# Patient Record
Sex: Female | Born: 1960 | Race: White | Hispanic: No | Marital: Married | Smoking: Never smoker
Health system: Southern US, Community
[De-identification: ages and names within clinical notes are randomized; demographics above are authoritative.]

## PROBLEM LIST (undated history)

## (undated) DIAGNOSIS — Z9889 Other specified postprocedural states: Secondary | ICD-10-CM

## (undated) DIAGNOSIS — I493 Ventricular premature depolarization: Secondary | ICD-10-CM

## (undated) DIAGNOSIS — M5136 Other intervertebral disc degeneration, lumbar region: Secondary | ICD-10-CM

## (undated) DIAGNOSIS — I341 Nonrheumatic mitral (valve) prolapse: Secondary | ICD-10-CM

## (undated) DIAGNOSIS — E232 Diabetes insipidus: Secondary | ICD-10-CM

## (undated) DIAGNOSIS — J45909 Unspecified asthma, uncomplicated: Secondary | ICD-10-CM

## (undated) DIAGNOSIS — E039 Hypothyroidism, unspecified: Secondary | ICD-10-CM

## (undated) DIAGNOSIS — J842 Lymphoid interstitial pneumonia: Secondary | ICD-10-CM

## (undated) DIAGNOSIS — IMO0002 Reserved for concepts with insufficient information to code with codable children: Secondary | ICD-10-CM

## (undated) DIAGNOSIS — M1990 Unspecified osteoarthritis, unspecified site: Secondary | ICD-10-CM

## (undated) DIAGNOSIS — R519 Headache, unspecified: Secondary | ICD-10-CM

## (undated) DIAGNOSIS — D649 Anemia, unspecified: Secondary | ICD-10-CM

## (undated) DIAGNOSIS — M51369 Other intervertebral disc degeneration, lumbar region without mention of lumbar back pain or lower extremity pain: Secondary | ICD-10-CM

## (undated) DIAGNOSIS — M329 Systemic lupus erythematosus, unspecified: Secondary | ICD-10-CM

## (undated) DIAGNOSIS — E079 Disorder of thyroid, unspecified: Secondary | ICD-10-CM

## (undated) DIAGNOSIS — M797 Fibromyalgia: Secondary | ICD-10-CM

## (undated) HISTORY — DX: Unspecified osteoarthritis, unspecified site: M19.90

## (undated) HISTORY — PX: COLONOSCOPY: SHX174

## (undated) HISTORY — DX: Other intervertebral disc degeneration, lumbar region: M51.36

## (undated) HISTORY — DX: Disorder of thyroid, unspecified: E07.9

## (undated) HISTORY — PX: PITUITARY EXCISION: SHX745

## (undated) HISTORY — DX: Anemia, unspecified: D64.9

## (undated) HISTORY — PX: TONSILLECTOMY: SUR1361

## (undated) HISTORY — DX: Ventricular premature depolarization: I49.3

---

## 1991-03-10 HISTORY — PX: PITUITARY EXCISION: SHX745

## 1998-04-09 ENCOUNTER — Other Ambulatory Visit: Admission: RE | Admit: 1998-04-09 | Discharge: 1998-04-09 | Payer: Self-pay | Admitting: Obstetrics and Gynecology

## 1998-11-11 ENCOUNTER — Emergency Department (HOSPITAL_COMMUNITY): Admission: EM | Admit: 1998-11-11 | Discharge: 1998-11-11 | Payer: Self-pay | Admitting: Emergency Medicine

## 1998-11-11 ENCOUNTER — Encounter: Payer: Self-pay | Admitting: Emergency Medicine

## 1998-11-22 ENCOUNTER — Ambulatory Visit (HOSPITAL_COMMUNITY): Admission: RE | Admit: 1998-11-22 | Discharge: 1998-11-22 | Payer: Self-pay | Admitting: Neurology

## 1998-11-27 ENCOUNTER — Encounter: Payer: Self-pay | Admitting: Neurology

## 1998-11-27 ENCOUNTER — Ambulatory Visit (HOSPITAL_COMMUNITY): Admission: RE | Admit: 1998-11-27 | Discharge: 1998-11-27 | Payer: Self-pay | Admitting: Neurology

## 1999-08-06 ENCOUNTER — Other Ambulatory Visit: Admission: RE | Admit: 1999-08-06 | Discharge: 1999-08-06 | Payer: Self-pay | Admitting: Obstetrics and Gynecology

## 2000-05-19 ENCOUNTER — Emergency Department (HOSPITAL_COMMUNITY): Admission: EM | Admit: 2000-05-19 | Discharge: 2000-05-19 | Payer: Self-pay | Admitting: Emergency Medicine

## 2000-05-19 ENCOUNTER — Encounter: Payer: Self-pay | Admitting: Emergency Medicine

## 2000-08-31 ENCOUNTER — Other Ambulatory Visit: Admission: RE | Admit: 2000-08-31 | Discharge: 2000-08-31 | Payer: Self-pay | Admitting: Obstetrics and Gynecology

## 2000-12-30 ENCOUNTER — Encounter: Payer: Self-pay | Admitting: Internal Medicine

## 2000-12-30 ENCOUNTER — Ambulatory Visit: Admission: RE | Admit: 2000-12-30 | Discharge: 2000-12-30 | Payer: Self-pay | Admitting: Internal Medicine

## 2001-01-17 ENCOUNTER — Ambulatory Visit (HOSPITAL_COMMUNITY): Admission: RE | Admit: 2001-01-17 | Discharge: 2001-01-17 | Payer: Self-pay | Admitting: Internal Medicine

## 2001-01-17 ENCOUNTER — Encounter: Payer: Self-pay | Admitting: Internal Medicine

## 2001-11-15 ENCOUNTER — Other Ambulatory Visit: Admission: RE | Admit: 2001-11-15 | Discharge: 2001-11-15 | Payer: Self-pay | Admitting: Obstetrics and Gynecology

## 2002-01-18 ENCOUNTER — Ambulatory Visit (HOSPITAL_COMMUNITY): Admission: RE | Admit: 2002-01-18 | Discharge: 2002-01-18 | Payer: Self-pay | Admitting: Obstetrics and Gynecology

## 2002-01-18 ENCOUNTER — Encounter (INDEPENDENT_AMBULATORY_CARE_PROVIDER_SITE_OTHER): Payer: Self-pay

## 2002-02-22 ENCOUNTER — Encounter: Payer: Self-pay | Admitting: Emergency Medicine

## 2002-02-22 ENCOUNTER — Emergency Department (HOSPITAL_COMMUNITY): Admission: EM | Admit: 2002-02-22 | Discharge: 2002-02-23 | Payer: Self-pay | Admitting: Emergency Medicine

## 2002-03-22 ENCOUNTER — Emergency Department (HOSPITAL_COMMUNITY): Admission: EM | Admit: 2002-03-22 | Discharge: 2002-03-22 | Payer: Self-pay | Admitting: Emergency Medicine

## 2002-09-21 ENCOUNTER — Ambulatory Visit (HOSPITAL_COMMUNITY): Admission: RE | Admit: 2002-09-21 | Discharge: 2002-09-21 | Payer: Self-pay | Admitting: Internal Medicine

## 2002-09-21 ENCOUNTER — Encounter: Payer: Self-pay | Admitting: Internal Medicine

## 2003-01-05 ENCOUNTER — Other Ambulatory Visit: Admission: RE | Admit: 2003-01-05 | Discharge: 2003-01-05 | Payer: Self-pay | Admitting: Obstetrics and Gynecology

## 2003-02-16 ENCOUNTER — Encounter: Admission: RE | Admit: 2003-02-16 | Discharge: 2003-02-16 | Payer: Self-pay | Admitting: Obstetrics and Gynecology

## 2003-06-15 ENCOUNTER — Emergency Department (HOSPITAL_COMMUNITY): Admission: EM | Admit: 2003-06-15 | Discharge: 2003-06-15 | Payer: Self-pay | Admitting: *Deleted

## 2003-06-18 ENCOUNTER — Ambulatory Visit (HOSPITAL_COMMUNITY): Admission: RE | Admit: 2003-06-18 | Discharge: 2003-06-18 | Payer: Self-pay | Admitting: Rheumatology

## 2003-07-10 ENCOUNTER — Inpatient Hospital Stay (HOSPITAL_COMMUNITY): Admission: EM | Admit: 2003-07-10 | Discharge: 2003-07-19 | Payer: Self-pay | Admitting: Emergency Medicine

## 2004-04-28 ENCOUNTER — Ambulatory Visit: Payer: Self-pay | Admitting: Internal Medicine

## 2004-05-06 ENCOUNTER — Ambulatory Visit: Payer: Self-pay | Admitting: Internal Medicine

## 2004-08-01 ENCOUNTER — Ambulatory Visit (HOSPITAL_COMMUNITY): Admission: RE | Admit: 2004-08-01 | Discharge: 2004-08-01 | Payer: Self-pay | Admitting: Specialist

## 2004-08-08 ENCOUNTER — Encounter: Admission: RE | Admit: 2004-08-08 | Discharge: 2004-08-08 | Payer: Self-pay | Admitting: Obstetrics and Gynecology

## 2004-11-21 ENCOUNTER — Other Ambulatory Visit: Admission: RE | Admit: 2004-11-21 | Discharge: 2004-11-21 | Payer: Self-pay | Admitting: Obstetrics and Gynecology

## 2005-04-27 ENCOUNTER — Encounter (HOSPITAL_COMMUNITY): Admission: RE | Admit: 2005-04-27 | Discharge: 2005-06-18 | Payer: Self-pay | Admitting: Internal Medicine

## 2005-06-22 ENCOUNTER — Emergency Department (HOSPITAL_COMMUNITY): Admission: EM | Admit: 2005-06-22 | Discharge: 2005-06-22 | Payer: Self-pay | Admitting: Emergency Medicine

## 2005-10-08 ENCOUNTER — Ambulatory Visit (HOSPITAL_COMMUNITY): Admission: RE | Admit: 2005-10-08 | Discharge: 2005-10-08 | Payer: Self-pay | Admitting: Obstetrics and Gynecology

## 2007-04-07 ENCOUNTER — Emergency Department (HOSPITAL_COMMUNITY): Admission: EM | Admit: 2007-04-07 | Discharge: 2007-04-08 | Payer: Self-pay | Admitting: Family Medicine

## 2007-06-21 ENCOUNTER — Ambulatory Visit (HOSPITAL_COMMUNITY): Admission: RE | Admit: 2007-06-21 | Discharge: 2007-06-21 | Payer: Self-pay | Admitting: Obstetrics and Gynecology

## 2007-07-12 ENCOUNTER — Emergency Department (HOSPITAL_COMMUNITY): Admission: EM | Admit: 2007-07-12 | Discharge: 2007-07-12 | Payer: Self-pay | Admitting: Emergency Medicine

## 2007-08-16 ENCOUNTER — Ambulatory Visit (HOSPITAL_COMMUNITY): Admission: RE | Admit: 2007-08-16 | Discharge: 2007-08-16 | Payer: Self-pay | Admitting: Internal Medicine

## 2007-11-24 ENCOUNTER — Ambulatory Visit (HOSPITAL_COMMUNITY): Admission: RE | Admit: 2007-11-24 | Discharge: 2007-11-24 | Payer: Self-pay | Admitting: Rheumatology

## 2008-02-01 ENCOUNTER — Emergency Department (HOSPITAL_COMMUNITY): Admission: EM | Admit: 2008-02-01 | Discharge: 2008-02-01 | Payer: Self-pay | Admitting: Emergency Medicine

## 2008-04-06 ENCOUNTER — Ambulatory Visit: Payer: Self-pay | Admitting: Cardiovascular Disease

## 2008-04-06 ENCOUNTER — Inpatient Hospital Stay (HOSPITAL_COMMUNITY): Admission: EM | Admit: 2008-04-06 | Discharge: 2008-04-10 | Payer: Self-pay | Admitting: Emergency Medicine

## 2008-04-09 ENCOUNTER — Encounter (HOSPITAL_BASED_OUTPATIENT_CLINIC_OR_DEPARTMENT_OTHER): Payer: Self-pay | Admitting: Internal Medicine

## 2008-04-10 ENCOUNTER — Encounter (INDEPENDENT_AMBULATORY_CARE_PROVIDER_SITE_OTHER): Payer: Self-pay | Admitting: Surgery

## 2008-08-30 ENCOUNTER — Ambulatory Visit (HOSPITAL_COMMUNITY): Admission: RE | Admit: 2008-08-30 | Discharge: 2008-08-30 | Payer: Self-pay | Admitting: Obstetrics and Gynecology

## 2009-05-22 ENCOUNTER — Ambulatory Visit: Payer: Self-pay | Admitting: Internal Medicine

## 2009-05-28 LAB — CBC WITH DIFFERENTIAL/PLATELET
Eosinophils Absolute: 0 10*3/uL (ref 0.0–0.5)
MONO#: 0.6 10*3/uL (ref 0.1–0.9)
MONO%: 10.3 % (ref 0.0–14.0)
NEUT#: 4.2 10*3/uL (ref 1.5–6.5)
RBC: 4.06 10*6/uL (ref 3.70–5.45)
RDW: 15.1 % — ABNORMAL HIGH (ref 11.2–14.5)
WBC: 5.8 10*3/uL (ref 3.9–10.3)

## 2009-05-30 LAB — COMPREHENSIVE METABOLIC PANEL
Albumin: 4.5 g/dL (ref 3.5–5.2)
Alkaline Phosphatase: 70 U/L (ref 39–117)
CO2: 20 mEq/L (ref 19–32)
Glucose, Bld: 88 mg/dL (ref 70–99)
Potassium: 4.2 mEq/L (ref 3.5–5.3)
Sodium: 124 mEq/L — ABNORMAL LOW (ref 135–145)
Total Protein: 7.8 g/dL (ref 6.0–8.3)

## 2009-05-30 LAB — PROTEIN ELECTROPHORESIS, SERUM
Beta 2: 4.8 % (ref 3.2–6.5)
Gamma Globulin: 20.5 % — ABNORMAL HIGH (ref 11.1–18.8)

## 2009-05-30 LAB — FERRITIN: Ferritin: 6 ng/mL — ABNORMAL LOW (ref 10–291)

## 2009-05-30 LAB — VITAMIN B12: Vitamin B-12: 371 pg/mL (ref 211–911)

## 2009-05-30 LAB — IRON AND TIBC: Iron: 79 ug/dL (ref 42–145)

## 2009-05-30 LAB — FOLATE: Folate: >20 ng/mL

## 2009-07-23 ENCOUNTER — Ambulatory Visit: Payer: Self-pay | Admitting: Internal Medicine

## 2009-07-24 LAB — CBC WITH DIFFERENTIAL/PLATELET
Basophils Absolute: 0 10*3/uL (ref 0.0–0.1)
HCT: 32.5 % — ABNORMAL LOW (ref 34.8–46.6)
HGB: 11 g/dL — ABNORMAL LOW (ref 11.6–15.9)
MCH: 27.7 pg (ref 25.1–34.0)
MONO#: 0.7 10*3/uL (ref 0.1–0.9)
NEUT%: 63.9 % (ref 38.4–76.8)
WBC: 4.9 10*3/uL (ref 3.9–10.3)
lymph#: 1 10*3/uL (ref 0.9–3.3)

## 2009-07-24 LAB — IRON AND TIBC
%SAT: 20 % (ref 20–55)
TIBC: 339 ug/dL (ref 250–470)

## 2009-07-24 LAB — FERRITIN: Ferritin: 27 ng/mL (ref 10–291)

## 2009-09-18 ENCOUNTER — Ambulatory Visit (HOSPITAL_COMMUNITY): Admission: RE | Admit: 2009-09-18 | Discharge: 2009-09-18 | Payer: Self-pay | Admitting: Rheumatology

## 2009-10-23 ENCOUNTER — Ambulatory Visit: Payer: Self-pay | Admitting: Internal Medicine

## 2009-10-23 LAB — CBC & DIFF AND RETIC
BASO%: 0.3 % (ref 0.0–2.0)
LYMPH%: 31.9 % (ref 14.0–49.7)
MCHC: 35.1 g/dL (ref 31.5–36.0)
MONO#: 0.6 10*3/uL (ref 0.1–0.9)
Platelets: 242 10*3/uL (ref 145–400)
RBC: 3.97 10*6/uL (ref 3.70–5.45)
RDW: 14 % (ref 11.2–14.5)
Retic %: 0.49 % — ABNORMAL LOW (ref 0.50–1.50)
Retic Ct Abs: 19.45 10*3/uL (ref 18.30–72.70)
WBC: 3.5 10*3/uL — ABNORMAL LOW (ref 3.9–10.3)

## 2009-10-23 LAB — FERRITIN: Ferritin: 50 ng/mL (ref 10–291)

## 2009-10-23 LAB — IRON AND TIBC: Iron: 62 ug/dL (ref 42–145)

## 2010-01-08 ENCOUNTER — Ambulatory Visit: Payer: Self-pay | Admitting: Internal Medicine

## 2010-01-08 ENCOUNTER — Telehealth: Payer: Self-pay | Admitting: Internal Medicine

## 2010-01-08 DIAGNOSIS — K310 Acute dilatation of stomach: Secondary | ICD-10-CM | POA: Insufficient documentation

## 2010-01-08 DIAGNOSIS — R1012 Left upper quadrant pain: Secondary | ICD-10-CM | POA: Insufficient documentation

## 2010-01-08 DIAGNOSIS — R110 Nausea: Secondary | ICD-10-CM | POA: Insufficient documentation

## 2010-01-08 DIAGNOSIS — D509 Iron deficiency anemia, unspecified: Secondary | ICD-10-CM

## 2010-01-08 LAB — CONVERTED CEMR LAB
ALT: 17 units/L (ref 0–35)
Albumin: 4 g/dL (ref 3.5–5.2)
Bilirubin, Direct: 0.1 mg/dL (ref 0.0–0.3)
Eosinophils Absolute: 0 10*3/uL (ref 0.0–0.7)
Eosinophils Relative: 1.3 % (ref 0.0–5.0)
Hemoglobin: 11.9 g/dL — ABNORMAL LOW (ref 12.0–15.0)
Lymphs Abs: 1 10*3/uL (ref 0.7–4.0)
MCV: 89.8 fL (ref 78.0–100.0)
Monocytes Absolute: 0.5 10*3/uL (ref 0.1–1.0)
Neutro Abs: 2.3 10*3/uL (ref 1.4–7.7)
Neutrophils Relative %: 58.1 % (ref 43.0–77.0)
Platelets: 230 10*3/uL (ref 150.0–400.0)
RDW: 13.7 % (ref 11.5–14.6)
Total Bilirubin: 0.5 mg/dL (ref 0.3–1.2)
Total Protein: 7.4 g/dL (ref 6.0–8.3)
WBC: 3.9 10*3/uL — ABNORMAL LOW (ref 4.5–10.5)

## 2010-01-09 ENCOUNTER — Ambulatory Visit: Payer: Self-pay | Admitting: Cardiology

## 2010-01-13 ENCOUNTER — Telehealth: Payer: Self-pay | Admitting: Internal Medicine

## 2010-03-30 ENCOUNTER — Encounter: Payer: Self-pay | Admitting: *Deleted

## 2010-04-08 NOTE — Assessment & Plan Note (Signed)
Summary: abdominal pain/bloating/sheri   History of Present Illness Visit Type: Initial Visit Primary GI MD: Stan Head MD [facility] Primary Provider: Creola Corn, MD Chief Complaint: abdominal pain, bloating 2 weeks; patient has Lupus History of Present Illness:   PLEASANT 50 YO FEMALE KNOWN TO DR. Juanda Chance FROM COLONOSCOPY IN 2006 WHICH WAS NORMAL. SHE HAS LUPUS,RAYNAUDS,CHRONIC ANEMIA-IS FOLLOWED BY RHEUMATOLOGY. SHE COMES IN TODAY WITH C/O PROGRESSIVE ABDOMINAL PAIN OVER THE PAST 11/2 TO 2 WEEKS.SHE DESCRIBES MARKED INCREASE IN FATIGUE OVER THE PAST 2 MONTHS ,AND WEAKNESS WHICH IS FELT DUE TO A FLARE OF THE LUPUS. SHE HAD BEEN OFF PLAQUENIL ,AND JUST RESTARTED A FEW DAYS AGO. NO OTHER NEW MEDS.  SHE DESRIBES LOWER ABDOMINAL PAIN, BLOATING AND VISIBLE DISTENTION OF HER ABDOMEN. HER CLOTHES FEEL TIGHT. APPETITE IS DECREASED AND SHE FEELS FULL,UNCOMFORTABLE QUICKLY WITH ANY by mouth INTAKE. HAS HAD SOME NAUSEA, NO VOMITING, +CHILLS, NO DOCUMENTED FEVER. BM'S HAVE BEEN NORMAL, NO MELENA OR HEME.TAKES OCCASIONAL ALEVE. SHE WAS SEEN BY DR MCOMB EARLIER THIS WEEK,SOME QUESTION OF AN OVARIAN CYST AND IS ON A COURSE OF CIPRO. NO IMAGING DONE. SHE REPORTS A NEGATIVE UA  AT THAT TIME.   GI Review of Systems    Reports abdominal pain, bloating, chest pain, loss of appetite, nausea, and  weight loss.     Location of  Abdominal pain: upper abdomen.    Denies acid reflux, belching, dysphagia with liquids, dysphagia with solids, heartburn, vomiting, vomiting blood, and  weight gain.      Reports constipation.     Denies anal fissure, black tarry stools, change in bowel habit, diarrhea, diverticulosis, fecal incontinence, heme positive stool, hemorrhoids, irritable bowel syndrome, jaundice, light color stool, liver problems, rectal bleeding, and  rectal pain. Preventive Screening-Counseling & Management  Alcohol-Tobacco     Smoking Status: never      Drug Use:  no.      Current Medications (verified): 1)   Synthroid 150 Mcg Tabs (Levothyroxine Sodium) .... Once Daily 2)  Topamax 100 Mg Tabs (Topiramate) .... At Bedtime 3)  Ddavp 0.01 % Soln (Desmopressin Acetate Spray) .... 2 Puffs At Bedtime 4)  Cipro 500 Mg Tabs (Ciprofloxacin Hcl) .... Two Times A Day 5)  Plaquenil 200 Mg Tabs (Hydroxychloroquine Sulfate) .... Take 2 Tablets By Mouth Two Times A Day 6)  Nitrostat 0.4 Mg Subl (Nitroglycerin) .... As Needed 7)  Integra Plus  Caps (Fefum-Fepoly-Fa-B Cmp-C-Biot) .... Once Daily 8)  Albuterol Sulfate (2.5 Mg/49ml) 0.083% Nebu (Albuterol Sulfate) .... As Needed Seasonally  Allergies (verified): 1)  ! * Magic Mouthwash 2)  ! Zithromax (Azithromycin) 3)  ! 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  Alcohol Use - no Illicit Drug Use - no Smoking Status:  never Drug Use:  no  Review of Systems       The patient complains of arthritis/joint pain, back pain, change in vision, fatigue, headaches-new, heart murmur, menstrual pain, and shortness of breath.  The patient denies allergy/sinus, anemia, anxiety-new, blood in urine, breast changes/lumps, confusion, cough, coughing up blood, depression-new, fainting, fever, hearing problems, heart rhythm changes, itching, muscle pains/cramps,  night sweats, nosebleeds, pregnancy symptoms, skin rash, sleeping problems, sore throat, swelling of feet/legs, swollen lymph glands, thirst - excessive , urination - excessive , urination changes/pain, urine leakage,  vision changes, and voice change.         SEE HPI  Vital Signs:  Patient profile:   50 year old female Height:      68 inches Weight:      150.38 pounds BMI:     22.95 Pulse rate:   60 / minute Pulse rhythm:   regular BP sitting:   110 / 74  (left arm) Cuff size:   regular  Vitals Entered By: June McMurray CMA Duncan Dull) (January 08, 2010 2:32 PM)  Physical Exam  General:  Well developed, well nourished, no acute distress. Head:  Normocephalic and atraumatic. Eyes:  PERRLA, no icterus. Lungs:  Clear throughout to auscultation. Heart:  Regular rate and rhythm; no murmurs, rubs,  or bruits. Abdomen:  SOFT, NO TYMPANY, BS+ NO PALP MASS OR HSM, TENDER LMQ/LLQ,NO GUARDING Rectal:  BROWN, HEME NEGATIVE STOOL Extremities:  No clubbing, cyanosis, edema or deformities noted. Neurologic:  Alert and  oriented x4;  grossly normal neurologically. Psych:  Alert and cooperative. Normal mood and affect.   Impression & Recommendations:  Problem # 1:  ABDOMINAL PAIN, LEFT UPPER QUADRANT (ICD-789.02) Assessment New  50 YO FEMALE WITH 2 WEEK HX OF PROGRESSIVE LEFT SIDED ABDOMINAL PAIN-LUQ/LLQ, ASSOCIATED WITH BLOATING ,DISTENTION, NAUSEA. ETIOLOGY IS NOT CLEAR.  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 LAST HGB 12.8  Problem # 3:  SLE Assessment: Comment Only ON PLAQUENIL  Patient Instructions: 1)  Please go to lab, basement level. 2)  We scheduled the CT scan at [facility] [address]. 3)  Directions and contrast  provided. 4)  We will call you with the  lab and CT scan results. 5)  Copy sent to : Creola Corn, Md 6)  The medication list was reviewed and reconciled.  All changed / newly prescribed medications were explained.  A complete medication list was provided to the patient / caregiver.

## 2010-04-08 NOTE — Progress Notes (Signed)
Summary: Severe abd pain/wans to be seen today  Phone Note Call from Patient Call back at [phone]  [employer] [facility] [employer] Employee   Call For: DR Juanda Chance Reason for Call: Talk to Nurse Summary of Call: Has severe abd pain. Thought it was related to her Lupus so she has been holding out until today when she had appt with her Rheumatologist. She was adviced to see her primary or GI today.  Initial call taken by: Leanor Kail [facility],  January 08, 2010 12:16 PM  Follow-up for Phone Call        Patient has a few week hx of abdominal pain.  She has a hx of Lupus and thought her abdominal pain was related.  She has pain "under my bra to my pelvis".  Patient c/o bloating needing to wear maternity pants.  She was recommended by Dr Gaye Alken office to be seen by GI or her primary care.  Patient  will come in this afternoon and see Mike Gip PA at 2:30 Follow-up by: Darcey Nora RN, CGRN,  January 08, 2010 1:28 PM

## 2010-04-08 NOTE — Progress Notes (Signed)
Summary: CT Results  Phone Note Call from Patient Call back at Work Phone [phone]   Caller: Patient Call For: Dr. Juanda Chance Reason for Call: Talk to Nurse Details for Reason: CT results Summary of Call: Patient called requesting results of CT scan done last Thursday. Please call. Initial call taken by: Schuyler Amor,  January 13, 2010 10:21 AM  Follow-up for Phone Call        Spoke with patient. She would like results from CT abd/pelvis done on 01/09/10. Please, advise. Follow-up by: Jesse Fall RN,  January 13, 2010 11:15 AM  Additional Follow-up for Phone Call Additional follow up Details #1::         Per Amy Esterwood, PA REGINA-CAN YOU PLEASE CALL Cathey J  Sachdev- I SAW HER LAST WEEK-CT ABD/PELVIS IS NEGATIVE. PT HAS LUPUS-FIND OUT HOW SHE IS FEELING,IF HER ABD PAIN IS BETTER WORSE ETC-WE MAY NEED TO GET HER BACK IN TO HER RHEUMATOLOGIST.    Additional Follow-up for Phone Call Additional follow up Details #2::    Spoke with patient and gave her CT results as per Mike Gip, PA. Per patient she is still having pain in left side. During the day, she feels "more full." Please, advise. Follow-up by: Jesse Fall RN,  January 13, 2010 2:01 PM  Additional Follow-up for Phone Call Additional follow up Details #3:: Details for Additional Follow-up Action Taken:  I AM CONCERNED THE PAIN MAY BE RELATED TO A FLARE OF HER LUPUS WITH POSSIBLE VASCULITIS IN ABDOMEN- I WOULD LIKE HER TO SEE HER RHEUMATOLOGIST SOON TO DISCUSS COURSE OF STEROIDS FIND OUT WHO SHE SEES, AND MAYBE WE SHOULD TRY TO GET HER WORKED IN   Additional Follow-up by: Peterson Ao,  January 13, 2010 2:46 PM   Message left for patient to call back at patients work number. Jesse Fall RN  January 13, 2010 3:06 PM    Spoke with patient. She wants to call Dr.   Corliss Skains herself. She will call me back when she gets the appointment.Jesse Fall RN January 13, 2010 4:00 PM    Spoke with patient. She is  going to call Dr. Corliss Skains today on her lunch hour. She will let us know what is suggested. Jesse Fall, RN January 14, 2010 11:00 AM    Patient called back to let us know she is waiting to hear from Dr. Corliss Skains. She states she is feeling much better. Jesse Fall, RN November 9,2011 8:30 AM  Appended Document: CT Results Patient called to report that Dr. Drusilla Kanner wants her to finish Cipro and take Plaquenil 400 mg. Patient is to call Dr. Drusilla Kanner if symptoms return.

## 2010-04-08 NOTE — Procedures (Signed)
Summary: Colonoscopy   Colonoscopy  Procedure date:  05/06/2004  Findings:      Location:  [facility].   Patient Name: Stefanie, [name]. MRN:  Procedure Procedures: Colonoscopy CPT: [phone].  Personnel: Endoscopist: Larua Collier L. Juanda Chance, MD.  Referred By: Creola Corn, MD.  Exam Location: Exam performed in Outpatient Clinic. Outpatient  Patient Consent: Procedure, Alternatives, Risks and Benefits discussed, consent obtained, from patient. Consent was obtained by the RN.  Indications  Evaluation of: Anemia with low iron saturation.  History  Current Medications: Patient is not currently taking Coumadin.  Pre-Exam Physical: Performed May 06, 2004. Entire physical exam was normal.  Exam Exam: Extent of exam reached: Cecum, extent intended: Cecum.  The cecum was identified by appendiceal orifice and IC valve. Colon retroflexion performed. Images taken. ASA Classification: I. Tolerance: good.  Monitoring: Pulse and BP monitoring, Oximetry used. Supplemental O2 given.  Colon Prep Used Miralax for colon prep.  Sedation Meds: Patient assessed and found to be appropriate for moderate (conscious) sedation. Fentanyl 75 mcg. given IV. Versed 7 mg. given IV.  Findings - NORMAL EXAM: Cecum.   Assessment Normal examination.  Comments: no polyps Events  Unplanned Interventions: No intervention was required.  Unplanned Events: There were no complications. Plans Medication Plan: Iron: 1 QD, starting May 06, 2004   Patient Education: Patient given standard instructions for: Yearly hemoccult testing recommended. Patient instructed to get routine colonoscopy every 10 years.  Comments: nothing to account for the iron deficiency anemia, if not resposive to iron, may need further evaluation Disposition: After procedure patient sent to recovery. After recovery patient sent home.    This report was created from the original endoscopy report, which was reviewed and  signed by the above listed endoscopist.     cc: Dr Timothy Lasso

## 2010-04-23 ENCOUNTER — Other Ambulatory Visit: Payer: Self-pay | Admitting: Internal Medicine

## 2010-04-23 ENCOUNTER — Encounter (HOSPITAL_BASED_OUTPATIENT_CLINIC_OR_DEPARTMENT_OTHER): Payer: 59 | Admitting: Internal Medicine

## 2010-04-23 DIAGNOSIS — D509 Iron deficiency anemia, unspecified: Secondary | ICD-10-CM

## 2010-04-23 DIAGNOSIS — D649 Anemia, unspecified: Secondary | ICD-10-CM

## 2010-04-23 DIAGNOSIS — D638 Anemia in other chronic diseases classified elsewhere: Secondary | ICD-10-CM

## 2010-04-23 LAB — CBC WITH DIFFERENTIAL/PLATELET
EOS%: 1 % (ref 0.0–7.0)
HGB: 11.9 g/dL (ref 11.6–15.9)
MCH: 30.1 pg (ref 25.1–34.0)
MCV: 86.5 fL (ref 79.5–101.0)
NEUT#: 3.3 10*3/uL (ref 1.5–6.5)
NEUT%: 65.9 % (ref 38.4–76.8)
RDW: 13.8 % (ref 11.2–14.5)
WBC: 5 10*3/uL (ref 3.9–10.3)

## 2010-04-23 LAB — IRON AND TIBC: %SAT: 27 % (ref 20–55)

## 2010-04-30 ENCOUNTER — Encounter (HOSPITAL_BASED_OUTPATIENT_CLINIC_OR_DEPARTMENT_OTHER): Payer: 59 | Admitting: Internal Medicine

## 2010-04-30 DIAGNOSIS — D509 Iron deficiency anemia, unspecified: Secondary | ICD-10-CM

## 2010-04-30 DIAGNOSIS — D638 Anemia in other chronic diseases classified elsewhere: Secondary | ICD-10-CM

## 2010-04-30 DIAGNOSIS — D649 Anemia, unspecified: Secondary | ICD-10-CM

## 2010-05-13 ENCOUNTER — Emergency Department (HOSPITAL_COMMUNITY): Payer: 59

## 2010-05-13 ENCOUNTER — Emergency Department (HOSPITAL_COMMUNITY)
Admission: EM | Admit: 2010-05-13 | Discharge: 2010-05-13 | Disposition: A | Discharge status: Home / Self Care | Payer: 59 | Attending: Emergency Medicine | Admitting: Emergency Medicine

## 2010-05-13 ENCOUNTER — Encounter (HOSPITAL_COMMUNITY): Payer: Self-pay | Admitting: Radiology

## 2010-05-13 DIAGNOSIS — E039 Hypothyroidism, unspecified: Secondary | ICD-10-CM | POA: Insufficient documentation

## 2010-05-13 DIAGNOSIS — E232 Diabetes insipidus: Secondary | ICD-10-CM | POA: Insufficient documentation

## 2010-05-13 DIAGNOSIS — Z79899 Other long term (current) drug therapy: Secondary | ICD-10-CM | POA: Insufficient documentation

## 2010-05-13 DIAGNOSIS — R071 Chest pain on breathing: Secondary | ICD-10-CM | POA: Insufficient documentation

## 2010-05-13 DIAGNOSIS — R059 Cough, unspecified: Secondary | ICD-10-CM | POA: Insufficient documentation

## 2010-05-13 DIAGNOSIS — M329 Systemic lupus erythematosus, unspecified: Secondary | ICD-10-CM | POA: Insufficient documentation

## 2010-05-13 DIAGNOSIS — R05 Cough: Secondary | ICD-10-CM | POA: Insufficient documentation

## 2010-05-13 DIAGNOSIS — R0602 Shortness of breath: Secondary | ICD-10-CM | POA: Insufficient documentation

## 2010-05-13 LAB — BASIC METABOLIC PANEL WITH GFR
BUN: 9 mg/dL (ref 6–23)
CO2: 27 meq/L (ref 19–32)
Calcium: 9.5 mg/dL (ref 8.4–10.5)
Chloride: 94 meq/L — ABNORMAL LOW (ref 96–112)
Creatinine, Ser: 0.62 mg/dL (ref 0.4–1.2)
GFR calc non Af Amer: >60 mL/min
Glucose, Bld: 86 mg/dL (ref 70–99)
Potassium: 3.9 meq/L (ref 3.5–5.1)
Sodium: 130 meq/L — ABNORMAL LOW (ref 135–145)

## 2010-05-13 LAB — POCT CARDIAC MARKERS
CKMB, poc: 1.9 ng/mL (ref 1.0–8.0)
CKMB, poc: 1.5 ng/mL (ref 1.0–8.0)
Myoglobin, poc: 95.1 ng/mL (ref 12–200)
Myoglobin, poc: 86 ng/mL (ref 12–200)
Troponin i, poc: <0.05 ng/mL (ref 0.00–0.09)
Troponin i, poc: <0.05 ng/mL (ref 0.00–0.09)

## 2010-05-13 LAB — DIFFERENTIAL
Basophils Absolute: 0 10*3/uL (ref 0.0–0.1)
Basophils Relative: 0 % (ref 0–1)
Eosinophils Absolute: 0 10*3/uL (ref 0.0–0.7)
Eosinophils Relative: 0 % (ref 0–5)
Lymphocytes Relative: 23 % (ref 12–46)
Lymphs Abs: 1.4 K/uL (ref 0.7–4.0)
Monocytes Absolute: 0.5 K/uL (ref 0.1–1.0)
Monocytes Relative: 9 % (ref 3–12)
Neutro Abs: 4 K/uL (ref 1.7–7.7)
Neutrophils Relative %: 68 % (ref 43–77)

## 2010-05-13 LAB — CBC
HCT: 36.2 % (ref 36.0–46.0)
Hemoglobin: 12.3 g/dL (ref 12.0–15.0)
MCH: 28.9 pg (ref 26.0–34.0)
MCHC: 34 g/dL (ref 30.0–36.0)
MCV: 85 fL (ref 78.0–100.0)
Platelets: 276 10*3/uL (ref 150–400)
RBC: 4.26 MIL/uL (ref 3.87–5.11)
RDW: 13.8 % (ref 11.5–15.5)
WBC: 5.9 10*3/uL (ref 4.0–10.5)

## 2010-05-13 LAB — D-DIMER, QUANTITATIVE: D-Dimer, Quant: 0.88 ug/mL-FEU — ABNORMAL HIGH (ref 0.00–0.48)

## 2010-05-13 MED ORDER — IOHEXOL 300 MG/ML  SOLN
58.0000 mL | Freq: Once | INTRAMUSCULAR | Status: AC | PRN
  Administered 2010-05-13: 58 mL via INTRAVENOUS

## 2010-06-23 LAB — URINALYSIS, ROUTINE W REFLEX MICROSCOPIC
Bilirubin Urine: NEGATIVE
Nitrite: NEGATIVE
Protein, ur: NEGATIVE mg/dL
Specific Gravity, Urine: 1.006 (ref 1.005–1.030)
Urobilinogen, UA: 1 mg/dL (ref 0.0–1.0)

## 2010-06-23 LAB — CARDIAC PANEL(CRET KIN+CKTOT+MB+TROPI)
CK, MB: 0.7 ng/mL (ref 0.3–4.0)
Relative Index: INVALID (ref 0.0–2.5)
Total CK: 22 U/L (ref 7–177)
Troponin I: <0.01 ng/mL (ref 0.00–0.06)

## 2010-06-23 LAB — ANTITHROMBIN III: AntiThromb III Func: 123 % — ABNORMAL HIGH (ref 76–126)

## 2010-06-23 LAB — CBC
MCHC: 33.1 g/dL (ref 30.0–36.0)
MCV: 87.3 fL (ref 78.0–100.0)
MCV: 87.3 fL (ref 78.0–100.0)
Platelets: 236 10*3/uL (ref 150–400)
Platelets: 241 10*3/uL (ref 150–400)
Platelets: 256 10*3/uL (ref 150–400)
RBC: 4.01 MIL/uL (ref 3.87–5.11)
RDW: 13.7 % (ref 11.5–15.5)
RDW: 13.8 % (ref 11.5–15.5)
RDW: 13.8 % (ref 11.5–15.5)
WBC: 10 10*3/uL (ref 4.0–10.5)

## 2010-06-23 LAB — PROTIME-INR: Prothrombin Time: 12.6 seconds (ref 11.6–15.2)

## 2010-06-23 LAB — COMPREHENSIVE METABOLIC PANEL
ALT: 14 U/L (ref 0–35)
AST: 20 U/L (ref 0–37)
Albumin: 3.5 g/dL (ref 3.5–5.2)
CO2: 24 mEq/L (ref 19–32)
Calcium: 8.8 mg/dL (ref 8.4–10.5)
Creatinine, Ser: 0.72 mg/dL (ref 0.4–1.2)
GFR calc Af Amer: >60 mL/min (ref >60)
Sodium: 133 mEq/L — ABNORMAL LOW (ref 135–145)
Total Protein: 7.5 g/dL (ref 6.0–8.3)

## 2010-06-23 LAB — BASIC METABOLIC PANEL
BUN: 13 mg/dL (ref 6–23)
Calcium: 8.9 mg/dL (ref 8.4–10.5)
Creatinine, Ser: 0.68 mg/dL (ref 0.4–1.2)
GFR calc non Af Amer: >60 mL/min (ref >60)
Glucose, Bld: 78 mg/dL (ref 70–99)

## 2010-06-23 LAB — BETA-2-GLYCOPROTEIN I ABS, IGG/M/A
Beta-2 Glyco I IgG: <4 U/mL (ref <20)
Beta-2-Glycoprotein I IgM: <4 U/mL (ref <10)

## 2010-06-23 LAB — DIFFERENTIAL
Basophils Absolute: 0 10*3/uL (ref 0.0–0.1)
Basophils Relative: 0 % (ref 0–1)
Lymphocytes Relative: 33 % (ref 12–46)
Neutro Abs: 4.1 10*3/uL (ref 1.7–7.7)

## 2010-06-23 LAB — D-DIMER, QUANTITATIVE: D-Dimer, Quant: 0.93 ug/mL-FEU — ABNORMAL HIGH (ref 0.00–0.48)

## 2010-06-23 LAB — LUPUS ANTICOAGULANT PANEL: Lupus Anticoagulant: NOT DETECTED

## 2010-06-23 LAB — CARDIOLIPIN ANTIBODIES, IGG, IGM, IGA: Anticardiolipin IgG: <7 [GPL'U] — ABNORMAL LOW (ref <11)

## 2010-06-23 LAB — APTT: aPTT: 28 seconds (ref 24–37)

## 2010-06-23 LAB — MYOGLOBIN, SERUM: Myoglobin: 26 ng/mL (ref <111)

## 2010-06-23 LAB — PROTEIN S ACTIVITY: Protein S Activity: 119 % (ref 69–129)

## 2010-06-23 LAB — PROTEIN S, TOTAL: Protein S Ag, Total: 84 % (ref 70–140)

## 2010-06-23 LAB — TECHNOLOGIST SMEAR REVIEW

## 2010-06-23 LAB — PROTEIN C, TOTAL: Protein C, Total: 113 % (ref 70–140)

## 2010-06-23 LAB — FACTOR 5 LEIDEN

## 2010-06-23 LAB — HOMOCYSTEINE: Homocysteine: 7.8 umol/L (ref 4.0–15.4)

## 2010-06-24 LAB — CBC
HCT: 33.3 % — ABNORMAL LOW (ref 36.0–46.0)
Hemoglobin: 10.6 g/dL — ABNORMAL LOW (ref 12.0–15.0)
MCHC: 33.6 g/dL (ref 30.0–36.0)
MCHC: 34.6 g/dL (ref 30.0–36.0)
MCV: 84.7 fL (ref 78.0–100.0)
MCV: 86.7 fL (ref 78.0–100.0)
Platelets: 247 10*3/uL (ref 150–400)
RBC: 3.63 MIL/uL — ABNORMAL LOW (ref 3.87–5.11)
RDW: 13.8 % (ref 11.5–15.5)
RDW: 13.8 % (ref 11.5–15.5)

## 2010-06-24 LAB — COMPREHENSIVE METABOLIC PANEL
ALT: 15 U/L (ref 0–35)
AST: 17 U/L (ref 0–37)
Albumin: 3.1 g/dL — ABNORMAL LOW (ref 3.5–5.2)
CO2: 22 mEq/L (ref 19–32)
Chloride: 103 mEq/L (ref 96–112)
Creatinine, Ser: 0.63 mg/dL (ref 0.4–1.2)
GFR calc Af Amer: >60 mL/min (ref >60)
Potassium: 4 mEq/L (ref 3.5–5.1)
Sodium: 130 mEq/L — ABNORMAL LOW (ref 135–145)
Total Bilirubin: 0.1 mg/dL — ABNORMAL LOW (ref 0.3–1.2)

## 2010-06-24 LAB — ANA: Anti Nuclear Antibody(ANA): POSITIVE — AB

## 2010-06-24 LAB — SEDIMENTATION RATE: Sed Rate: 19 mm/hr (ref 0–22)

## 2010-06-24 LAB — BASIC METABOLIC PANEL
BUN: 16 mg/dL (ref 6–23)
CO2: 21 mEq/L (ref 19–32)
Chloride: 99 mEq/L (ref 96–112)
Glucose, Bld: 122 mg/dL — ABNORMAL HIGH (ref 70–99)
Potassium: 3.9 mEq/L (ref 3.5–5.1)

## 2010-06-24 LAB — EXTRACTABLE NUCLEAR ANTIGEN ANTIBODY
Ribonucleic Protein(ENA) Antibody, IgG: 0.2 AI (ref <1.0)
SSB (La) (ENA) Antibody, IgG: 6 AI — ABNORMAL HIGH (ref <1.0)
Scleroderma (Scl-70) (ENA) Antibody, IgG: <0.2 AI (ref <1.0)

## 2010-06-24 LAB — C-REACTIVE PROTEIN: CRP: 0.1 mg/dL — ABNORMAL LOW (ref <0.6)

## 2010-06-24 LAB — CARDIAC PANEL(CRET KIN+CKTOT+MB+TROPI)
CK, MB: 0.6 ng/mL (ref 0.3–4.0)
Troponin I: <0.01 ng/mL (ref 0.00–0.06)
Troponin I: <0.01 ng/mL (ref 0.00–0.06)

## 2010-06-24 LAB — ANTI-NUCLEAR AB-TITER (ANA TITER)

## 2010-06-24 LAB — ANTI-NEUTROPHIL ANTIBODY

## 2010-06-24 LAB — RHEUMATOID FACTOR: Rhuematoid fact SerPl-aCnc: 176 IU/mL — ABNORMAL HIGH (ref 0–20)

## 2010-07-18 ENCOUNTER — Inpatient Hospital Stay (HOSPITAL_COMMUNITY): Payer: 59

## 2010-07-18 ENCOUNTER — Inpatient Hospital Stay (HOSPITAL_COMMUNITY)
Admission: AD | Admit: 2010-07-18 | Discharge: 2010-07-21 | DRG: 641 | Disposition: A | Discharge status: Home / Self Care | Payer: 59 | Source: Ambulatory Visit | Attending: Internal Medicine | Admitting: Internal Medicine

## 2010-07-18 DIAGNOSIS — K219 Gastro-esophageal reflux disease without esophagitis: Secondary | ICD-10-CM | POA: Diagnosis present

## 2010-07-18 DIAGNOSIS — N390 Urinary tract infection, site not specified: Secondary | ICD-10-CM | POA: Diagnosis present

## 2010-07-18 DIAGNOSIS — I10 Essential (primary) hypertension: Secondary | ICD-10-CM | POA: Diagnosis present

## 2010-07-18 DIAGNOSIS — I7300 Raynaud's syndrome without gangrene: Secondary | ICD-10-CM | POA: Diagnosis present

## 2010-07-18 DIAGNOSIS — E232 Diabetes insipidus: Secondary | ICD-10-CM | POA: Diagnosis present

## 2010-07-18 DIAGNOSIS — E039 Hypothyroidism, unspecified: Secondary | ICD-10-CM | POA: Diagnosis present

## 2010-07-18 DIAGNOSIS — M329 Systemic lupus erythematosus, unspecified: Secondary | ICD-10-CM | POA: Diagnosis present

## 2010-07-18 DIAGNOSIS — D509 Iron deficiency anemia, unspecified: Secondary | ICD-10-CM | POA: Diagnosis present

## 2010-07-18 DIAGNOSIS — E871 Hypo-osmolality and hyponatremia: Principal | ICD-10-CM | POA: Diagnosis present

## 2010-07-18 DIAGNOSIS — A498 Other bacterial infections of unspecified site: Secondary | ICD-10-CM | POA: Diagnosis present

## 2010-07-18 LAB — SODIUM, URINE, RANDOM: Sodium, Ur: 186 mEq/L

## 2010-07-18 LAB — URINALYSIS, ROUTINE W REFLEX MICROSCOPIC
Bilirubin Urine: NEGATIVE
Ketones, ur: NEGATIVE mg/dL
Nitrite: NEGATIVE
Urobilinogen, UA: 0.2 mg/dL (ref 0.0–1.0)
pH: 8 (ref 5.0–8.0)

## 2010-07-18 LAB — URINE MICROSCOPIC-ADD ON

## 2010-07-19 LAB — CBC
MCHC: 35.4 g/dL (ref 30.0–36.0)
Platelets: 245 10*3/uL (ref 150–400)
RDW: 12.5 % (ref 11.5–15.5)
WBC: 2.3 10*3/uL — ABNORMAL LOW (ref 4.0–10.5)

## 2010-07-19 LAB — BASIC METABOLIC PANEL
CO2: 19 mEq/L (ref 19–32)
Calcium: 9.1 mg/dL (ref 8.4–10.5)
Calcium: 9.7 mg/dL (ref 8.4–10.5)
Creatinine, Ser: 0.72 mg/dL (ref 0.4–1.2)
GFR calc Af Amer: >60 mL/min (ref >60)
GFR calc Af Amer: >60 mL/min (ref >60)
GFR calc non Af Amer: >60 mL/min (ref >60)
GFR calc non Af Amer: >60 mL/min (ref >60)
Glucose, Bld: 84 mg/dL (ref 70–99)
Sodium: 122 mEq/L — ABNORMAL LOW (ref 135–145)

## 2010-07-19 LAB — TSH: TSH: 17.058 u[IU]/mL — ABNORMAL HIGH (ref 0.350–4.500)

## 2010-07-20 LAB — CBC
MCH: 29.7 pg (ref 26.0–34.0)
MCHC: 34.9 g/dL (ref 30.0–36.0)
Platelets: 271 10*3/uL (ref 150–400)
RBC: 4.37 MIL/uL (ref 3.87–5.11)

## 2010-07-20 LAB — MAGNESIUM: Magnesium: 2.3 mg/dL (ref 1.5–2.5)

## 2010-07-20 LAB — BASIC METABOLIC PANEL
Calcium: 9.5 mg/dL (ref 8.4–10.5)
Chloride: 102 mEq/L (ref 96–112)
Creatinine, Ser: 0.68 mg/dL (ref 0.4–1.2)
GFR calc Af Amer: >60 mL/min (ref >60)

## 2010-07-21 LAB — BASIC METABOLIC PANEL
CO2: 21 mEq/L (ref 19–32)
Calcium: 9.2 mg/dL (ref 8.4–10.5)
Chloride: 106 mEq/L (ref 96–112)
Creatinine, Ser: 0.64 mg/dL (ref 0.4–1.2)
GFR calc non Af Amer: >60 mL/min (ref >60)
Potassium: 3.8 mEq/L (ref 3.5–5.1)

## 2010-07-21 LAB — CBC
HCT: 34.7 % — ABNORMAL LOW (ref 36.0–46.0)
MCV: 86.3 fL (ref 78.0–100.0)
RBC: 4.02 MIL/uL (ref 3.87–5.11)
WBC: 3.4 10*3/uL — ABNORMAL LOW (ref 4.0–10.5)

## 2010-07-21 LAB — URINE CULTURE

## 2010-07-22 NOTE — Op Note (Signed)
Stefanie [name], Stefanie [name]               ACCOUNT NO.:  [id number]   MEDICAL RECORD NO.:  [id number]          PATIENT TYPE:  INP   LOCATION:  6715                         FACILITY:  [facility]   PHYSICIAN:  Thornton Park. Daphine Deutscher, MD  DATE OF BIRTH:  [birth year]   DATE OF PROCEDURE:  DATE OF DISCHARGE:  04/10/2008                               OPERATIVE REPORT   PROCEDURE:  Punch biopsy in right anterior thigh.   INDICATION:  [name] is a 50 year old white female who has a history  of lupus who approximately a week ago woke up with an unknown rash on  her legs, arms, trunk, and back region.  Subsequently, the patient has  been on predinsone and is improving; however, the etiology of this rash  is unknown and therefore, we were asked to do a punch biopsy of the  patient's rash to rule out vasculitis.   PHYSICIAN:  Thornton Park. Daphine Deutscher, MD   ASSISTANT:  Letha Cape, PA   DESCRIPTION OF PROCEDURE:  Currently, the patient is lying at her bed.  Her anterior right thigh was exposed and prepped with Betadine.  A 1%  lidocaine was then used to numb up this area.  A 4-mm punch biopsy was  then used over the area of petechial rash on the patient's anterior  right thigh.  At this time, a small section of the patient's skin was  removed and placed in normal saline.  This area was then packed and dry  2x2's were applied over the site.   Complications were none.   ESTIMATED BLOOD LOSS:  Minimal.   DISPOSITION:  The patient remains in her bed in good condition.  The  patient tolerated this procedure well and at time of end procedure,  hemostasis of the site was achieved.      Letha Cape, PA      Thornton Park Daphine Deutscher, MD  Electronically Signed    KEO/MEDQ  D:  04/10/2008  T:  04/11/2008  Job:  [id number]   cc:   Kathryne Hitch, MD  Gwen Pounds, MD

## 2010-07-22 NOTE — H&P (Signed)
NAMEJERIKA, [name]               ACCOUNT NO.:  [id number]   MEDICAL RECORD NO.:  [id number]          PATIENT TYPE:  INP   LOCATION:  6715                         FACILITY:  [facility]   PHYSICIAN:  Barry Dienes. Eloise Harman, M.D.DATE OF BIRTH:  06/01/1960   DATE OF ADMISSION:  04/06/2008  DATE OF DISCHARGE:                              HISTORY & PHYSICAL   CHIEF COMPLAINT:  Severe rash.   HISTORY OF PRESENT ILLNESS:  The patient is a 50 year old Caucasian  woman with a history of systemic lupus erythematosus.  She was in her  usual state of good health until approximately 2 a.m. on April 04, 2008, when she awoke with moderate itching in bilateral thighs and arms.  Her symptoms progressed making it difficult to sleep.  The next day, she  was seen in our office and given the injection of Depo-Medrol, and  started on prednisone 40 mg daily.  However, her symptoms worsened, so  that she presented to the emergency room today.  She has severe itching  along both thighs, her abdomen, and back.  She has not had recent fever.  She has had occasional mild chills without vomiting, but has had mild  nausea and decreased appetite.  She has had no new medications, no new  lotions or soaps, and no new foods.  She has not eaten out at a  restaurant recently.  Her lupus disease has not presented with rash.  She had been treated with Plaquenil in September 2009, but stopped it  after 1 month.   PAST MEDICAL HISTORY:  1. Systemic lupus erythematosus.  2. Raynaud phenomenon.  3. Gastroesophageal reflux disease.  4. Migraine headaches.  5. Hypopituitarism.  6. November 2009 palpitations.  7. Hypertension.  8. Diabetes insipidus.  9. Chronic mild anemia with slightly low-iron levels.  10.Apparently, benign tongue nodule.   MEDICATIONS PRIOR TO ADMISSION:  1. Synthroid 125 mcg p.o. daily.  2. DDAVP 1 spray intranasally to each nares nightly.  3. Multivitamin 1 tablet daily.  4. Topamax 100 mg daily.  5. Maxalt 10 mg daily p.r.n. severe headache.  6. Prednisone 40 mg daily.  7. Over-the-counter Benadryl p.r.n.   ALLERGIES:  MAGIC MOUTHWASH, ZITHROMAX, and VENOFER (IV IRON).   PAST SURGICAL HISTORY:  1993, partial pituitary resection; April 2005  cerebral angiogram for severe headaches, which was normal.   FAMILY HISTORY:  Significant for a sister with diabetes mellitus.  There  are no close family members that have had colon cancer or breast cancer.  Her father died at age 79 of pneumonia after a stroke.  Mother is age 64  and has hypothyroidism.  A sister had diabetes mellitus, type 2 and some  cardiac issues.   SOCIAL HISTORY:  She is married and has 1 son age 80 and 1 daughter age  9.  She has no history of tobacco abuse or alcohol abuse.   REVIEW OF SYSTEMS:  Notable for some loose stools recently with chronic  Raynaud phenomenon symptoms.  She has not had change in her vision,  arthralgias, cough, vomiting, claudication pain, anxiety, or depression.  INITIAL PHYSICAL EXAMINATION:  VITAL SIGNS:  Blood pressure 115/77,  pulse 68, respirations 18, temperature 98.1, and pulse oxygen saturation  99% on room air.  GENERAL:  She is a well-nourished, well-developed white female, who was  uncomfortable and scratching both of her thighs due to itching.  HEAD, EYES, EARS, NOSE AND THROAT:  Within normal limits, her tongue was  not examined.  NECK:  Supple without jugular venous distention or carotid bruit.  CHEST:  Clear to auscultation.  HEART:  Regular rate and rhythm without significant murmur or gallop.  ABDOMEN:  Normal bowel sounds and no hepatosplenomegaly or tenderness.  EXTREMITIES:  Without cyanosis, clubbing, or edema, and the pedal pulses  were normal.  NEUROLOGIC:  She was alert and well oriented with normal affect.  She  could move all extremities well.  SKIN:  Significant for a large urticarial patches on both arms and  forearms as well as small patches of  urticaria on her abdomen and back.  There are some large patches of urticaria on both thighs, and there are  several patches of petechiae on both thighs.  There is a paucity of  urticaria lesions on her legs.   LABORATORY STUDIES:  White blood cells 7.2 with 58% neutrophils,  hemoglobin 11, hematocrit 34, and platelets 256.  Serum sodium 132,  potassium 3.2, chloride 98, carbon dioxide 25, BUN 13, creatinine 0.68,  and glucose 78.   In November 2009, serum sodium was 141, potassium 5.0, BUN 12,  creatinine 0.8, and glucose 76.  Liver associated enzymes normal.  White  blood cells 3.7, hemoglobin 15, hematocrit 46, and platelets 379.  TSH  3.8, free T4 of 1.0, 25-hydroxy vitamin D of 46.   IMPRESSION AND PLAN:  1. Urticaria:  This is severe and of uncertain cause.  She is      certainly quite uncomfortable despite reasonable initial treatment.      I doubt that she has vasculitis, medium vessel vasculitis causing      her symptoms.  I plan to administer Solu-Medrol 125 mg IV every 6      hours as well as Atarax 25 mg p.o. t.i.d. and Pepcid 20 mg twice      daily.  We will check a urinalysis and CBC with smear to rule out      the possibility of vasculitis.  In addition, we      will obtain an inpatient dermatologist consultation if this is      possible.  2. Hypokalemia:  Mild and of uncertain etiology.  We will replace      potassium via IV fluids.  3. Hypopituitarism:  Stable on DDAVP and Synthroid, which will be      continued during her hospitalization.           ______________________________  Barry Dienes Eloise Harman, M.D.     DGP/MEDQ  D:  04/06/2008  T:  04/07/2008  Job:  [id number]   cc:   Gwen Pounds, MD  C. Lesia Sago, M.D.  Pollyann Savoy, M.D.

## 2010-07-22 NOTE — Discharge Summary (Signed)
NAMECASSADI, Stefanie [name] NO.:  [id number]   MEDICAL RECORD NO.:  [id number]          PATIENT TYPE:  INP   LOCATION:  6715                         FACILITY:  [facility]   PHYSICIAN:  Stefanie Pounds, MD       DATE OF BIRTH:  [birth year]   DATE OF ADMISSION:  04/06/2008  DATE OF DISCHARGE:  04/10/2008                               DISCHARGE SUMMARY   PRIMARY CARE PHYSICIAN:  Stefanie Pounds, MD   RHEUMATOLOGIST:  Stefanie Hitch, MD   ALLERGIST:  Stefanie Hammond A. Barnetta Chapel, MD   NEUROLOGIST:  Stefanie Palau, MD   SURGEON:  Stefanie Park. Daphine Deutscher, MD   DISCHARGE DIAGNOSES:  1. Rash of undetermined etiology, started out as pruritic with      significant amount of hives and changed over to a full body      petechial rash.  Resolved with high dose of prednisone.  2. 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  3. Presumed and probable lupus flare.  4. Systemic lupus erythematosus.  5. Significant Raynaud syndrome.  6. Gastroesophageal reflux disease.  7. Migraines.  8. Hypothyroidism.  9. Hypertension.  10.Atypical chest pain with negative cardiac workup in the hospital to      get outpatient stress test soon.  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  12.Anemia of chronic disease with low iron.  13.History of tongue nodule.  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   DISCHARGE MEDICATIONS:  1. Synthroid 125 mcg p.o. daily.  2. Topamax 100 mg p.o. at bedtime.  3. Aspirin 81 mg p.o. daily, hold for 1 week after punch biopsy.  4. DDAVP 2 sprays at bedtime as discussed.  She is going to take an      extra spray for about 5 days until the time of my next blood draw.  5. Prednisone 20 mg p.o. t.i.d.  6. Protonix 40 mg p.o. daily.  7. Benadryl 25 mg p.o. q.6 h. p.r.n.  8. Tylenol 650 mg p.o. q.6 h. p.r.n.  9. Nitroglycerin 0.4 mg sublingual p.r.n. chest pain, can repeat q.5      minutes x3 and seek medical attention.  10.Multivitamin 1 p.o. daily.  11.Maxalt p.r.n.   AFTERCARE FOLLOWUP  INSTRUCTIONS:  She is to followup with me on Friday,  Dr. Corliss [name] on Thursday or Friday, and Dr. Eden [name] in 1-2 weeks for  stress evaluation.   DISCHARGE DIET:  She is to have a low-sodium, heart-healthy diet.   DISCHARGE ACTIVITY:  No restrictions on activity.   AFTER THOUGHTS:  She will need a followup CBC, BMET by Friday with me or  Dr. Corliss [name] and followup on the punch biopsy and followup on labs that  are still pending in the hospital.  Dr. Corliss [name] will consider  restarting Plaquenil and methotrexate for her underlying autoimmune  condition.  She will also need a bone density scan with IVA per me or  Gynecology in the near future.  Followup through this course of  steroids.   DISCHARGE PROCEDURES:  1. CT pulmonary angiogram of the chest reveals no evidence of acute      pulmonary emboli, scattered air  cyst throughout the lungs with      biapical scarring, and scattered nodularity.  These findings are      probably post-inflammatory, although they could be related to the      patient's underlying systemic lupus.  Neoplasm is felt to be      unlikely.  No adenopathy or pleural effusion.  CT followup of      nodules in 3-6 months is recommended to exclude metastatic disease.  2. 2-D echocardiogram dated April 09, 2008:  Overall left      ventricular systolic function was normal.  The EF was estimated at      55-65%.  There was no left ventricular regional wall motion      abnormalities and there was mild mitral valvular regurgitation and      no evidence of pericardial effusion.   HISTORY OF PRESENT ILLNESS:  Briefly, Ms. Stefanie [name] is a  longstanding patient of mine and Dr. Corliss [name] who has systemic lupus,  severe migraine headaches, diabetes insipidus, and is on the above-  mentioned medications and presented to medical attention on Wednesday of  last week with urticaria, hives, and pruritus.  She was given Depo  Medrol in the office as well as Xyzal and Pepcid.  She  went home at  night, got better overnight and as soon as the Depo-Medrol wore off, the  symptoms came back and got worse.  She was put on oral prednisone.  The  symptoms did not dissipate very well and actually got worse.  The next  step we decided was to send her to an allergist for evaluation and  treatment.  Unfortunately, it was a day of the snowstorm and most  allergy offices were not able to work her in.  They got her a quick  appointment on Monday, April 09, 2008, but unfortunately she got  sicker and required presentation to the emergency room and admission to  the hospital.  Based on how profuse, diffuse, and serious her rash was,  I do not necessarily think that if she would have seen an allergist at  the end of the day anything would have changed at all.  She has not been  on any new medications.  She has no new recent exposures, no new foods,  and nothing obvious to elicit an allergic type reaction.  She had been  on Plaquenil that was started in September.  She stopped that about a  month or 2 months later and this clearly did not seem like a  hypersensitivity reaction to that drug considering the time frame.  She  denied any fever but she did have chills, nausea without any vomiting.  She had a full body rash that was turning over to petechial patches,  especially in the bilateral thighs, arms, forearms, neck, back, chest,  and was then starting to affect her face.  Labs in the emergency  department were unremarkable.  Urinalysis was unremarkable.  Dr.  Eloise [name] saw her in the emergency department, please see his note for  further details and admitted her for urticaria on unclear cause and he  doubted vasculitis at the time.  He started on Solu-Medrol 125 IV q.6  h., Atarax 25 mg t.i.d., Pepcid 20 mg p.o. b.i.d., did a CBC with smear  which showed leukocytes, he corrected her underlying mild hypokalemia,  and kept her on all her home medications.   HOSPITAL COURSE:  Ms.  Stefanie [name] improved after all the steroids and  all the medications.  She had continued nonblanching patches of erythema  on bilateral thighs with near resolution of all patches of the  urticaria, but continued petechiae.  We elected on April 07, 2008 to  continue the IV steroids and to be on medications and said that if she  continues to improve, she will likely be discharged on April 08, 2008.  At 6 o'clock in the morning on April 08, 2008,  she developed chest  pain with shortness of breath and left arm hurting.  All vital signs  were stable.  Dr. Eloise [name] was notified.  EKG was obtained and enzymes  were obtained, and all this was negative.  Dr. Eloise [name] came in, saw  her, changed her from Pepcid to Protonix, checked the D-dimer which was  slightly positive, gave her a shot of heparin, got a CT pulmonary  angiogram which was negative for PE and then got Cardiology involved.  Cardiology did a 2-D echocardiogram which was negative and followed  serial EKGs and enzymes which were all negative as stated above and  feels that she will may have just had spasm but clearly no myocardial  infarction and may be some underlying anxiety, but elects to run a  outpatient stress Cardiolite to the longstanding lupus and inflammatory  condition.  I followed up with her on April 09, 2008.  She showed me  the rash and showed me pictures on her camera.  It was clearly a lot  better.  I reviewed all of the workup to date.  The CT pulmonary  angiogram also did show scattered air cyst and some nodularity and  recommended a followup in 3-6 months.  This will be done.  For the  diffuse pruritic rash that started as hives changed over to petechiae  and subsequently improved with steroids, not gone.  The question was  whether this is a lupus reaction, if this is a vasculitis, if this is an  allergy, if this is something related to her underlying autoimmune  condition.  She had canceled with Dr.  Barnetta [name], but is thinking about  following up with her after she follows up with me, Dr. Corliss [name], and  Dr. Eden [name] as an outpatient.  I went back to the office yesterday,  called Dr. Corliss [name], and we ordered a slew of labs, switched her over  to oral prednisone.  Dr. Corliss [name] recommend 20 mg p.o. t.i.d. which she  is currently on.  I took her off one of the IV medications and just  follow them while on the oral prednisone.  I also got General Surgery to  see her who is going to do a punch biopsy today on April 10, 2008 to  completely rule out the vasculitis.   We saw her on April 10, 2008.  I again talked to Dr. Corliss [name]  yesterday, ordered all the labs, some of them are back as of right now,  some of them will take a while to come back.  Her urticaria and pruritus  is 100% better.  Her rash continues to be followed, but improved.  She  just has 2 spots of petechial rash on her inner thighs bilaterally, the  right side is going to be punch biopsied today.  She has no more chest  pain and she is feeling up and stronger and ready to go home.  All her  vital signs were stable.  Her physical exam is better.  As a matter of  fact, the petechial rash is  on the right.  The left side has slight  petechial rash, but more of a complete bruise.  Her sed rate was only  19, but this was done on April 09, 2008 after the rash was markedly  better and after she has been on steroids for a few days.  I will admit  this is much lower than I expect.  The CRP was 0.1.  The sodium was 129,  the potassium 3.9, chloride 99, bicarb 21, BUN 16, creatinine 0.66, and  glucose 122.  White count 10.5, hemoglobin 11.5, and platelet count 247.  Rheumatoid factor was only thing that was abnormally high at 176.  The  C4 was 17 and the C3 was 97.  All of the urinalysis was negative.  Antithrombin III was 123.  Protein C is 200 and protein S was 119.  Lupus anticoagulant was negative.  Homocystine level was  negative.  Therefore the plan is, this is a presumed vasculitis in my mind versus  some sort of a weird lupus flare, but again the numbers of CRP and the  sed rate just do not support it, but we will continue the steroids, do  the punch biopsy today, and discharge to followup with me and Dr.  Corliss [name] later in the week.  If everything points to no, she will have  a much more rapid taper of the steroids.  If things turn out to be  vasculitis, she will have a much slower work taper and potentially be on  the methotrexate or Plaquenil.  For her systemic lupus, I believe that  Dr. Corliss [name] will start the Plaquenil or methotrexate as an outpatient  as she sees fit.  Of note, the rheumatoid factor is 2-3 times normal.  Again, this is for Dr. Corliss [name] to review and decide what the next step  is.  For her diabetes insipidus, she is on DDAVP.  Her sodium is  actually low, this has a lot more to do with the steroids, a dry mouth  and drinking too much just free water.  She will increase her DDAVP over  the next day or two and I will followup with the labs accordingly.   As for the chest pain, if she is having negative workup the stress test  will be done as an outpatient.  After the skin biopsy if  she remains in  stable condition, she is going to go home today.  Further lab tests that  are currently pending are an anti-CCP, a PAN-ANCA, a beta-2 glycoprotein  1, ENA, cryoglobulin level, and a hepatitis panel.      Stefanie Pounds, MD  Electronically Signed     JMR/MEDQ  D:  04/10/2008  T:  04/10/2008  Job:  [id number]   cc:   Stefanie Hitch, MD  C. Lesia Sago, M.D.  Stefanie Park Daphine Deutscher, MD  Meg A. Barnetta [name], M.D.

## 2010-07-22 NOTE — Consult Note (Signed)
Stefanie [name]               ACCOUNT NO.:  [id number]   MEDICAL RECORD NO.:  [id number]          PATIENT TYPE:  INP   LOCATION:  6715                         FACILITY:  [facility]   PHYSICIAN:  Peter C. Eden Emms, MD, FACCDATE OF BIRTH:  07-25-1960   DATE OF CONSULTATION:  04/08/2008  DATE OF DISCHARGE:                                 CONSULTATION   Stefanie [name], 50 year old patient, we were asked to see for chest pain.  Stefanie [name] actually is an acquaintance.  She works at the [employer] at  [employer] and was there at the time my kids went to [employer].   She was admitted to the hospital by Dr. Eloise Harman for persistent  urticarial rash.  She seems to be having some improvement with it.  She  has required steroids.  Back in December, the patient had some  palpitations and chest pain.  She was seen by one of the [facility] ER doctors  at [facility] ER and discharged.  She was told to follow up with Dr. Eloise Harman  and to have an echo, as far as I can tell this was not done.   The patient in general does not get exertional angina, PND, or  orthopnea.  There has been no recurrent palpitations or syncope.   While in the hospital she had recurrent chest pain.  It was atypical, it  was under the left breast, it was transient and seemed to be relieved  for going away even before she got nitroglycerin.  She had a recurrent  episode that seemed to be precipitated by Lovenox shot.   The patient's coronary risk factors are minimal.  She is a nonsmoker,  nondiabetic although she has diabetes insipidus from her pituitary  disease.  She is hypertensive, cholesterol status is unknown, but she  denies.   The patient is currently pain-free.  Her initial set of markers are  negative and her EKG is normal as it was during her ER visit at the end  of November or December.   PAST MEDICAL HISTORY:  Remarkable for lupus.  In general, she has not  had discoid lupus or skin involvement before.  She has  had Raynaud  disease, reflux, migraines, hypopituitarism with transsphenoidal surgery  by Dr. Lazarus Salines and Dr. Newell Coral in 2005, history of palpitations and  chest pain with ER visit at the end of November 2009, hypertension,  diabetes insipidus, and mild anemia.   MEDICATIONS:  1. Synthroid 125 mcg a day.  2. DDAVP.  3. Multivitamins.  4. Topamax 100 a day.  5. Maxalt.  6. Prednisone 40 a day.  7. Over-the-counter Benadryl.   ALLERGIES:  She is allergic to [facility], ZITHROMAX, and VENOFER.   PAST SURGICAL HISTORY:  She has not had any other surgery outside of her  partial pituitary resection.   FAMILY HISTORY:  Unremarkable for premature coronary artery disease.   SOCIAL HISTORY:  The patient is happily married.  I met her husband  today.  She works at [employer].  She does not smoke or drink.  She has 2 children.  She is otherwise fairly sedentary.   PHYSICAL EXAMINATION:  GENERAL:  Remarkable for a healthy-appearing  female, in no distress.  VITAL SIGNS:  Blood pressure 120/70, pulse 70 and regular, respiratory  rate 14, and afebrile.  HEENT:  Unremarkable.  NECK:  Carotids are normal without bruit.  No lymphadenopathy,  thyromegaly, or JVP elevation.  LUNGS:  Clear, good diaphragmatic motion.  No wheezing.  S1 and S2.  Normal heart sounds.  PMI normal.  ABDOMEN:  Benign.  Bowel sounds positive.  No AAA.  No tenderness.  No  bruit.  No hepatosplenomegaly, hepatojugular reflux, or tenderness.  EXTREMITIES:  Distal pulses are intact.  No edema.  NEUROLOGIC:  Nonfocal.  SKIN:  Resolving macular type rash, which is fairly generalized, which  includes the back and torso and especially the upper legs and  enterogenous areas.  She actually showed me pictures from her cell  phone, and the rash is much improved.  Otherwise, no edema.   Her EKG is normal.   She did not have a chest x-ray this admission.  Her chest x-ray done on  February 05, 2008, shows some  biapical pleural parenchymal scarring.   Her lab work is remarkable for D-dimer of 0.93.  Cardiac panel is  negative.  CBC shows hematocrit of 35.   Protime is normal.  PTT is normal.   IMPRESSION:  1. Chest pain atypical, normal EKG, negative enzymes.  The patient is      not at high risk for epicardial coronary disease.  However, given      her history of lupus, her migraines, and Raynaud's, she may be at      risk for coronary vasospasm and typically in this syndrome  there      are acute EKG changes with pain.   I suspect that she could have a 2-D echocardiogram in the morning.  If  the Primary Service wants to discharge her, she can be discharged and we  can do an outpatient workup on her.  I think that an adenosine Myoview  or a treadmill test would be in order.   I would discharge her with some sublingual nitroglycerin in case she has  recurrences as again I think the chance of vasospasm is higher than  fixed epicardial coronary disease.  1. Chest pain in the setting of an elevated D-dimer.  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      She said this was told to her by Dr. Sherril Croon.  I will write her      for a hypercoagulability panel.  Unless she has a documented PE for      positive enzyme, I do not think she needs to be on Lovenox or      heparin.  The patient and her husband were a little bit leery of      starting this and I took the liberty of canceling the order,      although since her D-dimer is elevated we will do a CT scan to rule      out PE.  I will try to discuss this with Dr. Eloise Harman before      ordering.  2. Lupus.  The patient seems to be doing well with this.  She is      currently on steroids for her rash.  She was previously on      Plaquenil, Primary Service will decide if this is to be  restarted.  3. Partial panhypopituitarism.  The patient is having DDAVP and      Synthroid replacement.  4. Urticarial rash,  improving.  Continue prednisone taper per Primary      Service.   Further recommendation will be based on the results of her clinical  course, but again I think her echo and Myoview can be done as an  outpatient if the Primary Service wants to discharge her in the morning.      Stefanie Pick. Eden Emms, MD, [facility]  Electronically Signed     PCN/MEDQ  D:  04/08/2008  T:  04/09/2008  Job:  [id number]

## 2010-07-22 NOTE — Consult Note (Signed)
Stefanie [name], Stefanie [name]               ACCOUNT NO.:  [id number]   MEDICAL RECORD NO.:  [id number]          PATIENT TYPE:  INP   LOCATION:  6715                         FACILITY:  [facility]   PHYSICIAN:  Thornton Park. Daphine Deutscher, MD  DATE OF BIRTH:  [birth year]   DATE OF CONSULTATION:  04/09/2008  DATE OF DISCHARGE:                                 CONSULTATION   TIME OF CONSULTATION:  11 a.m.   REQUESTING PHYSICIAN:  Gwen Pounds, MD.   CONSULTING SURGEON:  Thornton Park. Daphine Deutscher, MD.   REASON FOR CONSULTATION:  Biopsy to rule out vasculitis secondary to  rash unknown origin.   HISTORY OF PRESENT ILLNESS:  Stefanie [name] is a 50 year old white female  who has a history of lupus, hypothyroidism, hypopituitarism,  hypertension, diabetes insipidus, Raynaud's who woke up last Tuesday  morning at approximately 2 o'clock a.m. with hives and rash all over her  body.  The patient does have photos of these on her phone.  It does  appear that at this time, the patient had hives as well as petechial  type rash.  This rash at this time was all over her leg as well as her  upper extremities, her trunk as well as her back as well as in her  genital region.  At this time, the patient presented to a primary care  physician who was unsure what this rash was and placed her on  prednisone.  Apparently, at first they worsened after being placed on  prednisone, however over time that has slowly improved.  Throughout the  week, the patient had worsening symptoms associated with this rash  including chills as well as nausea.  On Friday, the patient was  miserable and at that time was admitted to the hospital.  Since the  patient has been admitted in the hospital she has been on prednisone,  which seems to be helping this rash.  At this time, they are also doing  several different lab tests including an ESR as well as C-reactive  protein.  At this time, we were asked to see the patient for a biopsy to  rule out  vasculitis.   REVIEW OF SYSTEMS:  Please see HPI, otherwise the patient does not note  beginning any new medications, new types of food and clothes, new  detergent or anything else that is new, that would or may cause this  rash.  Otherwise, currently all other symptoms are negative.   PAST MEDICAL HISTORY:  1. SLE.  2. GERD.  3. Migraine.  4. Hypothyroidism.  5. Hypopituitarism.  6. Hypertension.  7. Diabetes insipidus.  8. Raynaud's.   PAST SURGICAL HISTORY:  The patient states that she has had a  laparoscopy for endometriosis.   SOCIAL HISTORY:  The patient is married.  She does not use tobacco or  alcohol.  She does have a son and a daughter.   ALLERGIES:  1. MAGIC MOUTHWASH.  2. ZITHROMAX, which causes nausea.  3. __________  4. IV IRON.   MEDICATIONS:  1. Synthroid 125 mcg.  2. DDAVP.  3. Topamax 100 mg daily.  4. Maxalt 10 mg p.r.n.   PHYSICAL EXAMINATION:  GENERAL:  Stefanie [name] is a 50 year old white  female who is very pleasant, currently lying in bed, in no acute  distress.  VITAL SIGNS:  Temperature 97.2, pulse 56, respirations 18, and blood  pressure 130/71.  HEENT:  Head is normocephalic, atraumatic.  Sclerae noninjected.  Pupils  are equal, round, and reactive to light.  Ears, nose without any obvious  masses or lesions.  No rhinorrhea.  The patient does have a very faint  butterfly rash.  Mouth is pink and moist.  Throat shows no exudate.  HEART:  Regular rate and rhythm.  No murmurs, gallops, or rubs noted, +2  carotid, radial, and pedal pulses bilaterally.  LUNGS:  Clear to auscultation bilaterally.  No wheezes, rhonchi, or  rales noted.  Respiratory effort is unlabored.  ABDOMEN:  Soft, nontender, nondistended with active bowel sounds.  SKIN:  The patient has multiple areas of petechial type rashes on her  thighs.  Along with these, she also has multiple ecchymoses secondary to  pruritus.  These rashes as well as ecchymoses appear to be healing  as  the colors are more faint red than her original picture showed.  The  patient does not have any more hive, currently at this time.  Apparently  all of her petechial type rashes on her back as well as genital region,  trunk and arms have already disappeared, however, as stated earlier the  ones that are on her lower extremities do remain.  MUSCULOSKELETAL:  All 4 extremities are symmetrical with no cyanosis,  clubbing, or edema.   LABORATORY DATA AND DIAGNOSTICS:  White blood cell count is 10300,  hemoglobin 10.6, hematocrit 31.4, platelet count 232,000.  Sodium 130,  potassium 4.0, glucose 146, BUN 14, creatinine 0.63.  ESR is pending.  C-  reactive protein is pending.  Cardiac makers were negative.   IMPRESSION:  1. Systemic lupus erythematosus.  2. Rash of unknown etiology.  3. Hypothyroidism.  4. Hypopituitarism.  5. Hypertension.  6. Diabetes insipidus.  7. Raynaud's.   PLAN:  We will plan to perform a biopsy to rule out vasculitis.  At this  time, I will discuss the patient with Dr. Daphine Deutscher and we will make any  further recommendations per beside versus general anesthesia after  evaluation.  Otherwise, thank you for this consult and we will follow  along with you.      Letha Cape, PA      Thornton Park Daphine Deutscher, MD  Electronically Signed    KEO/MEDQ  D:  04/09/2008  T:  04/10/2008  Job:  [id number]   cc:   Noralyn Pick. Eden Emms, MD, [facility]  Gwen Pounds, MD

## 2010-08-06 NOTE — H&P (Signed)
Stefanie [name], Stefanie [name] NO.:  [id number]  MEDICAL RECORD NO.:  [id number]           PATIENT TYPE:  I  LOCATION:  5128                         FACILITY:  [facility]  PHYSICIAN:  Gwen Pounds, MD       DATE OF BIRTH:  1960/04/18  DATE OF ADMISSION:  07/18/2010 DATE OF DISCHARGE:                             HISTORY & PHYSICAL   PRIMARY CARE PROVIDER:  Gwen Pounds, MD  RHEUMATOLOGIST:  Pollyann Savoy, MD  CHIEF COMPLAINT:  Headache, nausea, dizziness, and confusion.  HISTORY OF PRESENT ILLNESS:  A 50 year old female with multiple medical problems presented unannounced today due to the fact that she is feeling very ill and was wondering if her labs were off again.  My nurse Efraim Kaufmann thought she looked terrible in terms of medical condition, had her do the labs and sit in the exam room waiting for the results to come back. On questioning her, she has been having worsening headaches, nausea, dizziness, confusion, dry heaving yesterday, not sleeping, 2 weeks of progressive symptoms especially since getting her teeth pulled out about 10 days ago.  She was placed on amoxicillin 500 t.i.d., which made things worse.  She has only been living on like liquid diet and soups, and she is only on the DDAVP 2 at bedtime for her known DI typically speaking when she gets to feeling this is miserable she ups the DDAVP and actually makes her situation worse.  This time, she did not do that, which is good, just got handed back her sodium it was 121; therefore, we will admit her for 1-3 days for symptom management and to correct her underlying electrolyte issues.  She is very dehydrated.  PAST MEDICAL HISTORY: 1. Systemic lupus. 2. 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 3. Raynaud's. 4. GERD. 5. History of migraines. 6. Pituitary adenoma status post pituitary resection. 7. Primary hypothyroidism. 8. Hypertension. 9. Diabetes  insipidus. 10.Atypical chest pain status post negative workup. 11.Known tongue nodule. 12.Anemia. 13.History of shingles. 14.Broken fourth toe in October 2004. 15.History of ovarian cyst. 16.2D echocardiogram in February 2010, EF 55-65%. 17.History of severe intractable migraine headaches for which she     required admission in May 2005.  FAMILY HISTORY:  Heart disease, stroke, diabetes.  SOCIAL HISTORY:  Married, two children, works in [employer] at [employer], nonsmoker, nondrinker.  REVIEW OF SYSTEMS:  Positive for chills, headache, anorexia, fatigue, malaise, sleep disturbance, blurring vision, dyspnea on exertion, nausea, vomiting, diarrhea, urinary frequency, muscle cramps, muscle weakness, stiffness, arthritis, weakness, dizziness, depression.  Full review of systems was obtained and otherwise negative.  ALLERGIES:  AZITHROMYCIN, MAGIC MOUTH WASH, and VENOFER.  Complete Medication List: 1)  Ddavp 0.01 % Soln (Desmopressin acetate spray) .... Use up to 3 sprays per day 2)  Nitroglycerin 0.4 Mg Subl (Nitroglycerin) .... Prn 3)  Synthroid 150 Mcg Tabs (Levothyroxine sodium) .... Take one tablet by mouth every day 4)  Topamax 100 Mg Tabs (Topiramate) .[name].. 1 po qhs 5)  Vitamin D 1000 Unit Caps (Cholecalciferol) .... Take 2  po qd 6)  Plaquenil 200 Mg Tabs (Hydroxychloroquine sulfate) .[name].. 1 po qd 7)  Integra F 125-1 Mg Caps (Fe fum-fepoly-fa-vit c-vit b3) .[name].. 1 po qd  PHYSICAL EXAMINATION:  VITAL SIGNS:  Last weight recorder was 152, height is 68, temperature 97.8, pulse 66, blood pressure 108/58. GENERAL APPEARANCE:  She is sick, weak, frail, sitting in a chair in the exam room hunched over. EYES:  PERRL.  EOMI. EAR, NOSE, AND THROAT:  Otherwise fine except for the gingivitis, some teeth missing, appears that she has had recent work.  She has got very dry mouth. NECK:  No JVD. RESPIRATORY:  Clear to auscultation bilaterally. CARDIAC:  Regular without  murmurs, gallops, or rubs. ABDOMEN:  Soft, nontender, nondistended.  Bowel sounds are positive. Abdominal scars are noted. NECK:  No lymphadenopathy. NEUROLOGIC:  Gait is weak and frail and slow.  She has got generalized weakness, but no focal weakness, and no neurologic issues.  Neurologic and judgment is grossly intact. SKIN:  No rashes.  No nodules.  ANCILLARY DATA:  BMET with sodium 121, BUN and creatinine are normal at 8 and 0.7.  CBC shows white count 3.2, hemoglobin 10.8, platelet count 241.  PLAN:  This is a 50 year old woman with multiple medical problems status post antibiotics and recent relative anorexia due to some mouth work that she has been getting done with her dentist who presents with weakness, failure to thrive, and significant hyponatremia as well as dehydration and is just poorly functional at this current time.  PLAN: 1. For the underlying hyponatremia, we will admit, place her on IV     fluids.  We will watch her sodium carefully, and if all the fluids     causes her to retain fluid, she may need IV fluids followed by     Lasix.  We will hold the DDAVP for now and may restart in the next     24-48 hours. 2. We will attempt to provide symptom relief and follow the sodium as     described.  For now, we will hold the amoxicillin and then reassess     in the morning. 3. Anemia of chronic disease.  The last time I checked in March she     was no longer anemic.  She is obviously little bit more anemic now.     She sees Dr. Jerolyn Center in the [facility] and she remains on     the Integra.  We will follow her hemoglobins at the hospital stay. 4. For her systemic lupus, she is supposed to be on Plaquenil.  Her     white count is a little low.  We will hold the Plaquenil for a day     or two and then reassess once we restart. 5. For her underlying anxiety, mood wise she had been doing well and     clearly is frustrated about this current illness, but hopefully  we     can get her back on feeling better pretty quickly and then make     decisions and assessments soon. 6. For her diabetes insipidus, again monitor her sodium and     electrolytes, and restart the DDAVP as deemed necessary. 7. For her GERD, she is currently doing well and does not need a     proton pump inhibitors  8.  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  Again, TSH has been ordered and will follow up. 8. For her migraine headaches, I  would not be surprised if she ends up     getting one.  She had been on ibuprofen in the past.  She has seen     Dr. Anne Hahn in the past and up until this recent illness, she has     been doing well from a headache and migraine standpoint. 9. My initial admission orders, I failed to include DVT prophylaxis,     and those orders will be called in.     Gwen Pounds, MD     JMR/MEDQ  D:  07/18/2010  T:  07/19/2010  Job:  [id number]  cc:   Pollyann Savoy, M.D. Marlan Palau, M.D. [place] Daphine Deutscher, MD Lajuana Matte, M.D.  Electronically Signed by Creola Corn MD on 08/06/2010 10:18:47 PM

## 2010-08-11 NOTE — Discharge Summary (Signed)
NAMEWILENE, Stefanie [name] NO.:  [id number]  MEDICAL RECORD NO.:  [id number]           PATIENT TYPE:  I  LOCATION:  5128                         FACILITY:  [facility]  PHYSICIAN:  Gwen Pounds, MD       DATE OF BIRTH:  1960-08-09  DATE OF ADMISSION:  07/18/2010 DATE OF DISCHARGE:  07/21/2010                              DISCHARGE SUMMARY   PRIMARY CARE PROVIDER:  Gwen Pounds, MD  RHEUMATOLOGIST:  Dr. Corliss Skains.  DISCHARGE DIAGNOSES: 1. Resolved hyponatremia.  Last sodium was 133-136.   Hypothyroidism, Escherichia coli urinary tract infection,  mouth and teeth issues. 2. Systemic lupus. 3. Diabetes insipidus - DI. 4. Iron deficiency anemia with last hemoglobin 13. 5. Headache with chronic headaches. 6. Resolved nausea, dizziness and confusion. 7. 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 8. Raynaud's. 9. Gastroesophageal reflux disease. 10.History of migraines. 11.Pituitary adenoma status post pituitary resection. 12.Primary hypothyroidism. 13.Hypertension. 14.History of atypical chest pain status post negative workup. 15.Known tongue nodule. 16.History of shingles. 17.Broken fourth toe in October 2004. 18.History of ovarian cyst. 19.A 2-D echo in February 2010.  Ejection fraction 55-65%. 20.History of severe intractable migraine headaches for which she     required admission in May 2005.  DISCHARGE MEDICATION LIST: 1. Acetaminophen 650 mg p.o. q.6 h. as needed. 2. DDAVP 2 sprays nasally at bedtime. 3. Levothyroxine 200 mcg daily before breakfast. 4. Advil 200 mg 1-3 tablets by mouth every 6 hours as needed. 5. Albuterol 1-2 puffs inhale every 4 hours as needed. 6. Hydrocodone/APAP 5/500 one half to one tablet by mouth daily at     bedtime as needed. 7. Integra 1 capsule by mouth every other day. 8. Maxalt 10 mg by mouth as needed for migraine. 9. Topamax 100 mg by mouth daily at bedtime. 10.She is to  hold her current Synthroid of 150 and probable would go     back to that after appropriate thyroid level checked as an     outpatient. 11.Plaquenil 200 mg by mouth twice daily.  We will restart when white     count is better as an outpatient. 12.Cefdinir 300 mg p.o. b.i.d. for mouth issues and urinary tract     infection #14 given.  Follow with Dr. Timothy Lasso on Thursday for labs.  Discharge procedures include; 1. EKG shows sinus bradycardia with first-degree AV block. 2. Medical management. 3. Chest x-ray two views, no acute cardiopulmonary disease.  HISTORY OF PRESENT ILLNESS:  Briefly, Ms. Stefanie [name] is a 50 year old female with multiple medical problems who presented to medical attention to my office with headache, nausea, dizziness, confusion worse since starting antibiotics for teeth issues/pulling teeth out.  She has not been able to eat or drink very much of late.  Labs were checked immediately and sodium was 121.  With her symptoms and the low-sodium, it was elected to directly admit her for further evaluation and treatment.  HOSPITAL COURSE:  Ms. Stefanie [name] is a 50 year old female with multiple medical problems status post antibiotics and recent relative anorexia due to some  mouth work that she has been getting with her dentist as well as antibiotic usage who presented to medical attention with weakness, failure to thrive, significant hyponatremia, dehydration and poorly functional.  For her underlying hyponatremia, we admitted her, placed her on IV fluids.  We held her DDAVP.  We watched her sodium carefully and contemplated the use of furosemide if needed.  We provided symptom relief and held the amoxicillin and followed labs.  For her systemic lupus, Plaquenil was held due to low white count and will be restarted once the white count comes up.  For her anemia of chronic disease, we kept her on her Integra and watched her white counts.  She was also placed on DVT  prophylaxis.  The following day, her sodium was 122 and she continued to feel weak. IV fluids were increased.  She had some microhematuria on urinalysis and culture was sent.  The following day, her sodium went to 133 and she started making a lot more urine.  Symptom wise she started slowly improving.  I saw her on Jul 21, 2010, and she is feeling better, less confusion, still with polyuria, minimally dizzy and very cold.  All of her vital signs were stable.  Her physical exam was returning back to normal.  Her urine culture grew out 85,000 colonies of E-coli.  Her white count was 3.  Her hemoglobin 13, platelet count was 231.  Sodium was 132, potassium 4.1.  TSH was 17.  For her hyponatremia, she was better.  We will get one more sodium at lunchtime and if fine, we will discharge the patient.  It was in the 135-136 range and she was discharged home.  For her hypothyroid, we increased the dose.  We are going to continue Synthroid at the higher dose and wean down to 150 as an outpatient as her TSH and free T4 has come back.  For her E-coli, UTI and mouth issues, she was placed on Cefdinir and will be discharged on a full 7-day course.  For her lupus, she remains off the Plaquenil but will be restarted relatively quickly as an outpatient.  For her DDAVP and diabetes insipidus, she was placed back on her DDAVP and will have labs redrawn later this week.  For her iron deficiency anemia, her hemoglobin will remain stable throughout the hospital stay.  For her physical weakness and deconditioning, she was up walking prior to discharge and expect her to gain full function and then any headache that she had was clearly better.  She was discharged home in stable condition on Jul 21, 2010, and again her last sodium was 136.     Gwen Pounds, MD     JMR/MEDQ  D:  08/07/2010  T:  08/08/2010  Job:  [id number]  cc:   Dr. Peggye Form  Electronically Signed by Creola Corn MD on 08/11/2010 09:19:12  AM

## 2010-11-27 LAB — POCT CARDIAC MARKERS
CKMB, poc: 1
Troponin i, poc: <0.05

## 2010-11-27 LAB — I-STAT 8, (EC8 V) (CONVERTED LAB)
Acid-base deficit: 3 — ABNORMAL HIGH
Glucose, Bld: 83
Potassium: 3.8
TCO2: 23
pCO2, Ven: 39.3 — ABNORMAL LOW
pH, Ven: 7.361 — ABNORMAL HIGH

## 2010-11-27 LAB — POCT I-STAT CREATININE
Creatinine, Ser: 0.8
Operator id: 270651

## 2010-11-27 LAB — D-DIMER, QUANTITATIVE: D-Dimer, Quant: 0.34

## 2010-12-03 LAB — POCT I-STAT, CHEM 8
BUN: 10
Calcium, Ion: 1.28
Chloride: 98
HCT: 38
Sodium: 134 — ABNORMAL LOW
TCO2: 23

## 2010-12-09 LAB — DIFFERENTIAL
Lymphocytes Relative: 33
Lymphs Abs: 1.5
Monocytes Relative: 9
Neutrophils Relative %: 55

## 2010-12-09 LAB — POCT I-STAT, CHEM 8
BUN: 16
Calcium, Ion: 1.16
Chloride: 104
Creatinine, Ser: 0.9
Glucose, Bld: 81
HCT: 35 — ABNORMAL LOW
Hemoglobin: 11.9 — ABNORMAL LOW
Potassium: 3.6
Sodium: 137
TCO2: 22

## 2010-12-09 LAB — POCT CARDIAC MARKERS
CKMB, poc: <1 — ABNORMAL LOW
Myoglobin, poc: 64.4
Troponin i, poc: <0.05

## 2010-12-09 LAB — GLUCOSE, CAPILLARY: Glucose-Capillary: 78

## 2010-12-09 LAB — CBC
Platelets: 277
RBC: 3.8 — ABNORMAL LOW
WBC: 4.5

## 2010-12-09 LAB — CALCIUM: Calcium: 9.2

## 2010-12-09 LAB — MAGNESIUM: Magnesium: 2.2

## 2010-12-09 LAB — TSH: TSH: 3.988

## 2010-12-18 ENCOUNTER — Other Ambulatory Visit (HOSPITAL_COMMUNITY): Payer: Self-pay | Admitting: Obstetrics and Gynecology

## 2010-12-18 DIAGNOSIS — Z1231 Encounter for screening mammogram for malignant neoplasm of breast: Secondary | ICD-10-CM

## 2010-12-25 ENCOUNTER — Ambulatory Visit (HOSPITAL_COMMUNITY)
Admission: RE | Admit: 2010-12-25 | Discharge: 2010-12-25 | Disposition: A | Discharge status: Home / Self Care | Payer: 59 | Source: Ambulatory Visit | Attending: Obstetrics and Gynecology | Admitting: Obstetrics and Gynecology

## 2010-12-25 DIAGNOSIS — Z1231 Encounter for screening mammogram for malignant neoplasm of breast: Secondary | ICD-10-CM | POA: Insufficient documentation

## 2011-11-30 ENCOUNTER — Other Ambulatory Visit (HOSPITAL_COMMUNITY): Payer: Self-pay | Admitting: Obstetrics and Gynecology

## 2011-11-30 DIAGNOSIS — Z1231 Encounter for screening mammogram for malignant neoplasm of breast: Secondary | ICD-10-CM

## 2011-12-31 ENCOUNTER — Ambulatory Visit (HOSPITAL_COMMUNITY): Payer: 59

## 2012-01-06 ENCOUNTER — Ambulatory Visit (HOSPITAL_COMMUNITY): Payer: 59

## 2012-01-26 ENCOUNTER — Ambulatory Visit (HOSPITAL_COMMUNITY)
Admission: RE | Admit: 2012-01-26 | Discharge: 2012-01-26 | Disposition: A | Discharge status: Home / Self Care | Payer: 59 | Source: Ambulatory Visit | Attending: Obstetrics and Gynecology | Admitting: Obstetrics and Gynecology

## 2012-01-26 DIAGNOSIS — Z1231 Encounter for screening mammogram for malignant neoplasm of breast: Secondary | ICD-10-CM | POA: Insufficient documentation

## 2012-02-26 ENCOUNTER — Other Ambulatory Visit: Payer: Self-pay | Admitting: Obstetrics & Gynecology

## 2012-04-23 ENCOUNTER — Other Ambulatory Visit: Payer: Self-pay

## 2012-12-06 ENCOUNTER — Emergency Department (HOSPITAL_COMMUNITY): Payer: 59

## 2012-12-06 ENCOUNTER — Encounter (HOSPITAL_COMMUNITY): Payer: Self-pay | Admitting: *Deleted

## 2012-12-06 ENCOUNTER — Inpatient Hospital Stay (HOSPITAL_COMMUNITY)
Admission: EM | Admit: 2012-12-06 | Discharge: 2012-12-08 | DRG: 546 | Disposition: A | Discharge status: Home / Self Care | Payer: 59 | Attending: Internal Medicine | Admitting: Internal Medicine

## 2012-12-06 DIAGNOSIS — F411 Generalized anxiety disorder: Secondary | ICD-10-CM | POA: Diagnosis present

## 2012-12-06 DIAGNOSIS — R0781 Pleurodynia: Secondary | ICD-10-CM | POA: Diagnosis present

## 2012-12-06 DIAGNOSIS — IMO0001 Reserved for inherently not codable concepts without codable children: Secondary | ICD-10-CM | POA: Diagnosis present

## 2012-12-06 DIAGNOSIS — E232 Diabetes insipidus: Secondary | ICD-10-CM | POA: Diagnosis present

## 2012-12-06 DIAGNOSIS — M329 Systemic lupus erythematosus, unspecified: Principal | ICD-10-CM | POA: Diagnosis present

## 2012-12-06 DIAGNOSIS — Z79899 Other long term (current) drug therapy: Secondary | ICD-10-CM

## 2012-12-06 DIAGNOSIS — I059 Rheumatic mitral valve disease, unspecified: Secondary | ICD-10-CM | POA: Diagnosis present

## 2012-12-06 DIAGNOSIS — R091 Pleurisy: Secondary | ICD-10-CM

## 2012-12-06 DIAGNOSIS — F419 Anxiety disorder, unspecified: Secondary | ICD-10-CM | POA: Diagnosis present

## 2012-12-06 HISTORY — DX: Diabetes insipidus: E23.2

## 2012-12-06 HISTORY — DX: Systemic lupus erythematosus, unspecified: M32.9

## 2012-12-06 HISTORY — DX: Reserved for concepts with insufficient information to code with codable children: IMO0002

## 2012-12-06 HISTORY — DX: Nonrheumatic mitral (valve) prolapse: I34.1

## 2012-12-06 HISTORY — DX: Fibromyalgia: M79.7

## 2012-12-06 LAB — BASIC METABOLIC PANEL
Calcium: 10.7 mg/dL — ABNORMAL HIGH (ref 8.4–10.5)
GFR calc non Af Amer: >90 mL/min (ref >90)
Sodium: 138 mEq/L (ref 135–145)

## 2012-12-06 LAB — POCT I-STAT TROPONIN I: Troponin i, poc: 0 ng/mL (ref 0.00–0.08)

## 2012-12-06 LAB — CBC
MCH: 29.2 pg (ref 26.0–34.0)
MCHC: 35 g/dL (ref 30.0–36.0)
Platelets: 276 10*3/uL (ref 150–400)

## 2012-12-06 LAB — PRO B NATRIURETIC PEPTIDE: Pro B Natriuretic peptide (BNP): 103.2 pg/mL (ref 0–125)

## 2012-12-06 MED ORDER — SODIUM CHLORIDE 0.9 % IV SOLN
INTRAVENOUS | Status: DC
  Administered 2012-12-06: 23:00:00 via INTRAVENOUS
  Administered 2012-12-07: 75 mL/h via INTRAVENOUS

## 2012-12-06 MED ORDER — PANTOPRAZOLE SODIUM 40 MG PO TBEC
40.0000 mg | DELAYED_RELEASE_TABLET | Freq: Two times a day (BID) | ORAL | Status: DC
  Administered 2012-12-07 – 2012-12-08 (×3): 40 mg via ORAL
  Filled 2012-12-06 (×3): qty 1

## 2012-12-06 MED ORDER — METHYLPREDNISOLONE SODIUM SUCC 125 MG IJ SOLR
60.0000 mg | Freq: Once | INTRAMUSCULAR | Status: AC
  Administered 2012-12-06: 60 mg via INTRAVENOUS
  Filled 2012-12-06: qty 2

## 2012-12-06 MED ORDER — KETOROLAC TROMETHAMINE 30 MG/ML IJ SOLN
30.0000 mg | Freq: Three times a day (TID) | INTRAMUSCULAR | Status: DC | PRN
  Administered 2012-12-07: 30 mg via INTRAVENOUS
  Filled 2012-12-06: qty 1

## 2012-12-06 MED ORDER — LIDOCAINE 5 % EX PTCH
1.0000 | MEDICATED_PATCH | CUTANEOUS | Status: DC
  Administered 2012-12-06 – 2012-12-07 (×2): 1 via TRANSDERMAL
  Filled 2012-12-06 (×3): qty 1

## 2012-12-06 MED ORDER — NALOXONE HCL 0.4 MG/ML IJ SOLN: 0.4000 mg | INTRAMUSCULAR | Status: DC | PRN

## 2012-12-06 MED ORDER — HYDROMORPHONE HCL PF 1 MG/ML IJ SOLN
0.5000 mg | Freq: Once | INTRAMUSCULAR | Status: AC
  Administered 2012-12-06: 0.5 mg via INTRAVENOUS
  Filled 2012-12-06: qty 1

## 2012-12-06 MED ORDER — OXYCODONE HCL 5 MG PO TABS
5.0000 mg | ORAL_TABLET | ORAL | Status: DC | PRN
  Administered 2012-12-07 (×2): 5 mg via ORAL
  Filled 2012-12-06 (×2): qty 1

## 2012-12-06 MED ORDER — TOPIRAMATE 100 MG PO TABS
100.0000 mg | ORAL_TABLET | Freq: Every day | ORAL | Status: DC
  Administered 2012-12-07: 100 mg via ORAL
  Filled 2012-12-06 (×3): qty 1

## 2012-12-06 MED ORDER — ACETAMINOPHEN 500 MG PO TABS
1000.0000 mg | ORAL_TABLET | Freq: Three times a day (TID) | ORAL | Status: DC | PRN
  Administered 2012-12-07 – 2012-12-08 (×2): 1000 mg via ORAL
  Filled 2012-12-06 (×3): qty 2

## 2012-12-06 MED ORDER — IOHEXOL 350 MG/ML SOLN
100.0000 mL | Freq: Once | INTRAVENOUS | Status: AC | PRN
  Administered 2012-12-06: 100 mL via INTRAVENOUS

## 2012-12-06 MED ORDER — LEVOTHYROXINE SODIUM 150 MCG PO TABS
150.0000 ug | ORAL_TABLET | Freq: Every day | ORAL | Status: DC
  Administered 2012-12-07 – 2012-12-08 (×2): 150 ug via ORAL
  Filled 2012-12-06 (×3): qty 1

## 2012-12-06 MED ORDER — GABAPENTIN 600 MG PO TABS
300.0000 mg | ORAL_TABLET | Freq: Two times a day (BID) | ORAL | Status: DC
  Filled 2012-12-06: qty 0.5

## 2012-12-06 MED ORDER — GABAPENTIN 300 MG PO CAPS
300.0000 mg | ORAL_CAPSULE | Freq: Two times a day (BID) | ORAL | Status: DC
  Administered 2012-12-06 – 2012-12-08 (×4): 300 mg via ORAL
  Filled 2012-12-06 (×5): qty 1

## 2012-12-06 MED ORDER — LORAZEPAM 2 MG/ML IJ SOLN
1.0000 mg | Freq: Once | INTRAMUSCULAR | Status: AC
  Administered 2012-12-06: 1 mg via INTRAVENOUS
  Filled 2012-12-06: qty 1

## 2012-12-06 MED ORDER — DESMOPRESSIN ACE REFRIGERATED 0.01 % NA SOLN
1.0000 [drp] | Freq: Every day | NASAL | Status: DC
  Filled 2012-12-06: qty 2.5

## 2012-12-06 MED ORDER — PREDNISONE 20 MG PO TABS
20.0000 mg | ORAL_TABLET | Freq: Every day | ORAL | Status: DC
  Administered 2012-12-07: 20 mg via ORAL
  Filled 2012-12-06 (×2): qty 1

## 2012-12-06 MED ORDER — VITAMIN D3 25 MCG (1000 UNIT) PO TABS
1000.0000 [IU] | ORAL_TABLET | Freq: Every day | ORAL | Status: DC
  Administered 2012-12-07 – 2012-12-08 (×2): 1000 [IU] via ORAL
  Filled 2012-12-06 (×2): qty 1

## 2012-12-06 MED ORDER — HYDROXYCHLOROQUINE SULFATE 200 MG PO TABS
200.0000 mg | ORAL_TABLET | Freq: Two times a day (BID) | ORAL | Status: DC
Start: 2012-12-06 — End: 2012-12-08
  Administered 2012-12-06 – 2012-12-08 (×4): 200 mg via ORAL
  Filled 2012-12-06 (×5): qty 1

## 2012-12-06 MED ORDER — ENOXAPARIN SODIUM 30 MG/0.3ML ~~LOC~~ SOLN
30.0000 mg | SUBCUTANEOUS | Status: DC
  Administered 2012-12-06 – 2012-12-07 (×2): 30 mg via SUBCUTANEOUS
  Filled 2012-12-06 (×3): qty 0.3

## 2012-12-06 MED ORDER — ALBUTEROL SULFATE HFA 108 (90 BASE) MCG/ACT IN AERS
1.0000 | INHALATION_SPRAY | Freq: Four times a day (QID) | RESPIRATORY_TRACT | Status: DC | PRN
  Filled 2012-12-06: qty 6.7

## 2012-12-06 MED ORDER — LORAZEPAM 2 MG/ML IJ SOLN: 1.0000 mg | INTRAMUSCULAR | Status: DC | PRN

## 2012-12-06 MED ORDER — HYDROMORPHONE HCL PF 1 MG/ML IJ SOLN: 0.5000 mg | INTRAMUSCULAR | Status: DC | PRN

## 2012-12-06 NOTE — ED Notes (Signed)
EDP made aware of pt's HR in the 130's.

## 2012-12-06 NOTE — H&P (Signed)
Stefanie [name] is an 52 y.o. female.   PCP:   Gwen Pounds, MD   Chief Complaint:  Pleuritic CP, Anxiety, SOB  HPI: 10 F c [name] - recently saw me and her Rheum Dr Corliss Skains and b/c it is felt that she is in a lupus flare c Atypical CP she was stated on Pred Taper and her plaquenil was restarted.  Discussion regarding possible MTX was entertained. Full range of outpt labs and (-) Quantiferon Gold for TB was done.  TB (-).  Na was 127 and with DI she held one dose of ddAVP and drank lots of gatorrade.  Na much better.  She called our office with CP and SOB and was very anxious.  Almost sounded like a panic attack over the phone and was sent to ED for Eval and Rx.  EKG Sinus Tahy and no issues.  CTPA was (-) for PE and no Pulm issues.  Labs unremarkable - Ca 10.7.  First set of enzymes were (-).  Called for inpt Observation to R/Out MI - doubtful, [ain control, anxiety control.  It is felt she has sig pleuricy as Sxs worse c inspiration/expiration.  Pain is mostly on the right side of her chest and she feels as if she can't take a deep breath. Denies any leg pain or swelling. No recent cough or fever. Symptoms have been persistent. Nothing makes them better. No associated rashes to her chest. Denies any anginal type pain. She has not had any diaphoresis with this. No nausea or vomiting. Patient does feel more anxious. No prior history of same.  First sat was 93% - ? Validity.  O2 started and sats have been fine since.  This probably represents a lupus flare c sig anxiety overlay.  ? If prednisone making this worse or needs more?? She had Dilaudid and Ativan in ED.  HR has been bouncing all over and Max was 130.     Past Medical History:  Past Medical History  Diagnosis Date  . Fibromyalgia   . Diabetes insipidus   . Lupus   . Mitral valve prolapse   . SLE (systemic lupus erythematosus) 12/06/2012    History reviewed. No pertinent past surgical history.    Allergies:    Allergies  Allergen Reactions  . Azithromycin Other (See Comments)    "couldnt sleep"     Medications: Prior to Admission medications   Medication Sig Start Date End Date Taking? Authorizing Provider  acetaminophen (TYLENOL) 500 MG tablet Take 1,000 mg by mouth every 6 (six) hours as needed for pain.   Yes Historical Provider, MD  albuterol (PROVENTIL HFA;VENTOLIN HFA) 108 (90 BASE) MCG/ACT inhaler Inhale 1 puff into the lungs every 6 (six) hours as needed for wheezing or shortness of breath.   Yes Historical Provider, MD  Cholecalciferol (VITAMIN D PO) Take 1 capsule by mouth daily.   Yes Historical Provider, MD  desmopressin (DDAVP RHINAL TUBE) 0.01 % SOLN Place 1 spray into the nose at bedtime.   Yes Historical Provider, MD  FeFum-FePoly-FA-B Cmp-C-Biot (INTEGRA PLUS PO) Take 1 tablet by mouth daily.   Yes Historical Provider, MD  hydroxychloroquine (PLAQUENIL) 200 MG tablet Take 200 mg by mouth 2 (two) times daily.   Yes Historical Provider, MD  ibuprofen (ADVIL,MOTRIN) 200 MG tablet Take 400 mg by mouth every 6 (six) hours as needed for pain.   Yes Historical Provider, MD  levothyroxine (SYNTHROID, LEVOTHROID) 150 MCG tablet Take 150 mcg by mouth daily before  breakfast.   Yes Historical Provider, MD  nitroGLYCERIN (NITROSTAT) 0.4 MG SL tablet Place 0.4 mg under the tongue every 5 (five) minutes as needed for chest pain.   Yes Historical Provider, MD  rizatriptan (MAXALT) 10 MG tablet Take 10 mg by mouth daily as needed for migraine. May repeat in 2 hours if needed   Yes Historical Provider, MD  topiramate (TOPAMAX) 100 MG tablet Take 100 mg by mouth daily.   Yes Historical Provider, MD      (Not in a hospital admission)   Social History:  reports that she has never smoked. She does not have any smokeless tobacco history on file. She reports that she does not drink alcohol or use illicit drugs.  Family History: History reviewed. No pertinent family history.  Review of Systems:   Review of Systems - Full ROS done c pt .  + CP. SOB. Pleuricy, No other issues x anxiety, fear, pain.  Physical Exam:  Blood pressure 122/60, pulse 124, temperature 97.8 F (36.6 C), temperature source Oral, resp. rate 26, SpO2 100.00%. Filed Vitals:   12/06/12 1930 12/06/12 2000 12/06/12 2015 12/06/12 2030  BP: 150/97 154/85 148/90 122/60  Pulse: 109 130 129 124  Temp:      TempSrc:      Resp: 20 17 23 26   SpO2: 100% 100% 100% 100%   General appearance: Scared, anxious Head: Normocephalic, without obvious abnormality, atraumatic Eyes: l eye c mild erythema and bloodshot. Nose: Nares normal. Septum midline. Mucosa normal. No drainage or sinus tenderness. Throat: mouth dry. Poor teeth  Neck No Adenopathy, no carotid bruit, no JVD and thyroid not enlarged, symmetric, no tenderness/mass/nodules Resp: CTA but clutches R Chest c Deep Breath Cardio: Reg s murmur.  Mild Tachy.  HR 104 when I saw her GI: soft, non-tender; bowel sounds normal; no masses,  no organomegaly Extremities: extremities normal, atraumatic, no cyanosis or edema Pulses: 2+ and symmetric Lymph nodes:  no cervical lymphadenopathy Neurologic: Alert and oriented X 3, normal strength and tone. Normal symmetric reflexes.     Labs on Admission:   Recent Labs  12/06/12 1709  NA 138  K 3.8  CL 99  CO2 27  GLUCOSE 96  BUN 12  CREATININE 0.70  CALCIUM 10.7*   No results found for this basename: AST, ALT, ALKPHOS, BILITOT, PROT, ALBUMIN,  in the last 72 hours No results found for this basename: LIPASE, AMYLASE,  in the last 72 hours  Recent Labs  12/06/12 1709  WBC 8.5  HGB 13.5  HCT 38.6  MCV 83.4  PLT 276   No results found for this basename: CKTOTAL, CKMB, CKMBINDEX, TROPONINI,  in the last 72 hours Lab Results  Component Value Date   INR 0.9 04/06/2008     LAB RESULT POCT:  Results for orders placed during the hospital encounter of 12/06/12  CBC      Result Value Range   WBC 8.5  4.0 - 10.5  K/uL   RBC 4.63  3.87 - 5.11 MIL/uL   Hemoglobin 13.5  12.0 - 15.0 g/dL   HCT 19.1  47.8 - 29.5 %   MCV 83.4  78.0 - 100.0 fL   MCH 29.2  26.0 - 34.0 pg   MCHC 35.0  30.0 - 36.0 g/dL   RDW 62.1  30.8 - 65.7 %   Platelets 276  150 - 400 K/uL  BASIC METABOLIC PANEL      Result Value Range   Sodium 138  135 -  145 mEq/L   Potassium 3.8  3.5 - 5.1 mEq/L   Chloride 99  96 - 112 mEq/L   CO2 27  19 - 32 mEq/L   Glucose, Bld 96  70 - 99 mg/dL   BUN 12  6 - 23 mg/dL   Creatinine, Ser 1.61  0.50 - 1.10 mg/dL   Calcium 09.6 (*) 8.4 - 10.5 mg/dL   GFR calc non Af Amer >90  >90 mL/min   GFR calc Af Amer >90  >90 mL/min  PRO B NATRIURETIC PEPTIDE      Result Value Range   Pro B Natriuretic peptide (BNP) 103.2  0 - 125 pg/mL  POCT I-STAT TROPONIN I      Result Value Range   Troponin i, poc 0.00  0.00 - 0.08 ng/mL   Comment 3               Radiological Exams on Admission: Dg Chest 2 View  12/06/2012   CLINICAL DATA:  Chest pain chest pain and shortness of breath.  EXAM: CHEST - 2 VIEW  COMPARISON:  07/18/2010  FINDINGS: The heart size and mediastinal contours are within normal limits. Both lungs are clear. The visualized skeletal structures are unremarkable.  IMPRESSION: No active disease.   Electronically Signed   By: Irish Lack   On: 12/06/2012 19:50   Ct Angio Chest Pe W/cm &/or Wo Cm  12/06/2012   CLINICAL DATA:  Chest pain and shortness of Breath.  EXAM: CT ANGIOGRAPHY CHEST WITH CONTRAST  TECHNIQUE: Multidetector CT imaging of the chest was performed using the standard protocol during bolus administration of intravenous contrast. Multiplanar CT image reconstructions including MIPs were obtained to evaluate the vascular anatomy.  CONTRAST:  OMNIPAQUE IOHEXOL 350 MG/ML SOLN  COMPARISON:  05/13/2010.  FINDINGS: The chest wall is unremarkable. The bony thorax is intact. No acute bony findings or spinal canal compromise.  The heart is normal in size. No pericardial effusion. No  mediastinal or hilar mass or adenopathy. Small scattered nodes are noted. The aorta is normal in caliber. No dissection. The esophagus is grossly normal.  The pulmonary arterial tree is well opacified. No filling defects to suggest pulmonary emboli.  The lungs demonstrate mild emphysematous changes and scattered lung cysts. . No acute pulmonary findings. No pleural effusion. No worrisome pulmonary lesions.  The upper abdomen is unremarkable.  Review of the MIP images confirms the above findings.  IMPRESSION: 1. No CT findings for pulmonary embolism. 2. Normal thoracic aorta. 3. No acute pulmonary findings. Stable emphysematous changes and scattered parenchymal air-filled pulmonary cysts.   Electronically Signed   By: Loralie Champagne M.D.   On: 12/06/2012 20:10      Orders placed during the hospital encounter of 12/06/12  . EKG 12-LEAD  . EKG 12-LEAD  . ED EKG  . ED EKG     Assessment/Plan Principal Problem:   Pleuritic chest pain Active Problems:   SLE (systemic lupus erythematosus)   DI (diabetes insipidus)   Anxiety  Will admit for observation this lady who apparently is in the midst of a SLE flare c Significant Pleuritic R sided CP.  EKG (-) for Pericarditis.  CTPA (-) for PE or Pulm issue.  Labs (-).  Will admit for pain and anxiety control.  Will use nacotics, Toradol, Lidoderm, Neurontin, One dose of IV steroids followed by her usual Pred dosing, IVF, Anxiolytics, and will follow labs and get an ECHO to R/Out Pericardial fluid/Structural heart issues.  Tele.  Home meds where they are appropriate.  Continue Plaquenil.  Will have Narcan available in case of need.  Ck/Trop I in am.  Protonix started as she is on Prednisone.     Laquanda Bick M 12/06/2012, 8:52 PM

## 2012-12-06 NOTE — ED Notes (Signed)
Pt reports pain starting to come back.  Sts pain is still localized to anterior chest with radiation to back and neck.  Pt ST on the monitor.  EDP made aware of pt's pain level.

## 2012-12-06 NOTE — ED Notes (Addendum)
Pt reports having mid chest pressure that started around noon and getting progressively worse, radiates through to right side of back. Feels like she cant take a deep breath, spo2 100% at triage, hx of lupus, ekg done. Pt appears anxious at triage.

## 2012-12-06 NOTE — ED Provider Notes (Signed)
CSN: [id number]     Arrival date & time 12/06/12  1649 History   First MD Initiated Contact with Patient 12/06/12 1736     Chief Complaint  Patient presents with  . Chest Pain  . Shortness of Breath   (Consider location/radiation/quality/duration/timing/severity/associated sxs/prior Treatment) Patient is a 52 y.o. female presenting with chest pain and shortness of breath. The history is provided by the patient.  Chest Pain Associated symptoms: shortness of breath   Shortness of Breath Associated symptoms: chest pain    patient here complaining of sudden onset of midsternal chest pressure that is been pleuritic. Pain is mostly on the right side of her chest and she feels as if she can't take a deep breath. Denies any leg pain or swelling. No recent cough or fever. Symptoms have been persistent. Nothing makes them better. No associated rashes to her chest. Denies any anginal type pain. She has not had any diaphoresis with this. No nausea or vomiting. Patient does feel more anxious. No prior history of same.  Past Medical History  Diagnosis Date  . Fibromyalgia   . Diabetes insipidus   . Lupus   . Mitral valve prolapse    History reviewed. No pertinent past surgical history. History reviewed. No pertinent family history. History  Substance Use Topics  . Smoking status: Never Smoker   . Smokeless tobacco: Not on file  . Alcohol Use: No   OB History   Grav Para Term Preterm Abortions TAB SAB Ect Mult Living                 Review of Systems  Respiratory: Positive for shortness of breath.   Cardiovascular: Positive for chest pain.  All other systems reviewed and are negative.    Allergies  Azithromycin  Home Medications  No current outpatient prescriptions on file. BP 134/88  Pulse 93  Temp(Src) 97.8 F (36.6 C) (Oral)  Resp 20  SpO2 100% Physical Exam  Nursing note and vitals reviewed. Constitutional: She is oriented to person, place, and time. She appears  well-developed and well-nourished.  Non-toxic appearance. No distress.  HENT:  Head: Normocephalic and atraumatic.  Eyes: Conjunctivae, EOM and lids are normal. Pupils are equal, round, and reactive to light.  Neck: Normal range of motion. Neck supple. No tracheal deviation present. No mass present.  Cardiovascular: Normal rate, regular rhythm and normal heart sounds.  Exam reveals no gallop.   No murmur heard. Pulmonary/Chest: Effort normal and breath sounds normal. No stridor. No respiratory distress. She has no decreased breath sounds. She has no wheezes. She has no rhonchi. She has no rales.  Abdominal: Soft. Normal appearance and bowel sounds are normal. She exhibits no distension. There is no tenderness. There is no rebound and no CVA tenderness.  Musculoskeletal: Normal range of motion. She exhibits no edema and no tenderness.  Neurological: She is alert and oriented to person, place, and time. She has normal strength. No cranial nerve deficit or sensory deficit. GCS eye subscore is 4. GCS verbal subscore is 5. GCS motor subscore is 6.  Skin: Skin is warm and dry. No abrasion and no rash noted.  Psychiatric: Her speech is normal and behavior is normal. Her mood appears anxious.    ED Course  Procedures (including critical care time) Labs Review Labs Reviewed  CBC  BASIC METABOLIC PANEL  PRO B NATRIURETIC PEPTIDE   Imaging Review No results found.  MDM  No diagnosis found.  Date: 12/06/2012  Rate: 98  Rhythm:  normal sinus rhythm  QRS Axis: normal  Intervals: normal  ST/T Wave abnormalities: normal  Conduction Disutrbances:none  Narrative Interpretation:   Old EKG Reviewed: none available  8:46 PM Patient given IV fluids and pain medication along with Ativan. Patient is tachycardic. Spoke with patient's primary care Dr., Dr. Timothy Lasso, will be admitted for observation.    Toy Baker, MD 12/06/12 [phone]

## 2012-12-07 DIAGNOSIS — I059 Rheumatic mitral valve disease, unspecified: Secondary | ICD-10-CM

## 2012-12-07 LAB — CBC
HCT: 35.7 % — ABNORMAL LOW (ref 36.0–46.0)
Hemoglobin: 11.7 g/dL — ABNORMAL LOW (ref 12.0–15.0)
MCV: 85.4 fL (ref 78.0–100.0)
Platelets: 245 10*3/uL (ref 150–400)
RBC: 4.18 MIL/uL (ref 3.87–5.11)
RDW: 14.3 % (ref 11.5–15.5)
WBC: 9.4 10*3/uL (ref 4.0–10.5)

## 2012-12-07 LAB — BASIC METABOLIC PANEL
BUN: 11 mg/dL (ref 6–23)
CO2: 26 mEq/L (ref 19–32)
Calcium: 9.5 mg/dL (ref 8.4–10.5)
Chloride: 101 mEq/L (ref 96–112)
Creatinine, Ser: 0.68 mg/dL (ref 0.50–1.10)
Glucose, Bld: 137 mg/dL — ABNORMAL HIGH (ref 70–99)
Potassium: 4.1 mEq/L (ref 3.5–5.1)
Sodium: 137 mEq/L (ref 135–145)

## 2012-12-07 LAB — CK TOTAL AND CKMB (NOT AT ARMC)
CK, MB: 1.5 ng/mL (ref 0.3–4.0)
Relative Index: INVALID (ref 0.0–2.5)

## 2012-12-07 MED ORDER — PANTOPRAZOLE SODIUM 40 MG PO TBEC: 40.0000 mg | DELAYED_RELEASE_TABLET | Freq: Two times a day (BID) | ORAL | Status: DC

## 2012-12-07 MED ORDER — BOOST PLUS PO LIQD
237.0000 mL | ORAL | Status: DC
  Administered 2012-12-07: 237 mL via ORAL
  Filled 2012-12-07 (×3): qty 237

## 2012-12-07 MED ORDER — LORAZEPAM 0.5 MG PO TABS: 0.5000 mg | ORAL_TABLET | Freq: Three times a day (TID) | ORAL | Status: DC | PRN

## 2012-12-07 MED ORDER — LIDOCAINE 5 % EX PTCH: 1.0000 | MEDICATED_PATCH | CUTANEOUS | Status: DC

## 2012-12-07 MED ORDER — HYDROMORPHONE HCL PF 1 MG/ML IJ SOLN: 0.5000 mg | Freq: Four times a day (QID) | INTRAMUSCULAR | Status: DC | PRN

## 2012-12-07 MED ORDER — PREDNISONE 20 MG PO TABS
20.0000 mg | ORAL_TABLET | Freq: Every day | ORAL | Status: DC
  Administered 2012-12-08: 20 mg via ORAL
  Filled 2012-12-07 (×2): qty 1

## 2012-12-07 MED ORDER — METHYLPREDNISOLONE SODIUM SUCC 125 MG IJ SOLR
60.0000 mg | Freq: Two times a day (BID) | INTRAMUSCULAR | Status: AC
  Administered 2012-12-07 (×2): 60 mg via INTRAVENOUS
  Filled 2012-12-07 (×2): qty 0.96

## 2012-12-07 MED ORDER — GABAPENTIN 300 MG PO CAPS: 300.0000 mg | ORAL_CAPSULE | Freq: Two times a day (BID) | ORAL | Status: DC

## 2012-12-07 MED ORDER — OXYCODONE HCL 5 MG PO TABS: 5.0000 mg | ORAL_TABLET | ORAL | Status: DC | PRN

## 2012-12-07 MED ORDER — PREDNISONE 20 MG PO TABS: 20.0000 mg | ORAL_TABLET | Freq: Every day | ORAL | Status: DC

## 2012-12-07 NOTE — Progress Notes (Signed)
CSW received referral to check on patients status in hospital as inpatient, CSW will refer this on to Case Manager who will follow up. CSW signing off. Please re consult if CSW needs arise.  Maree Krabbe, MSW, Theresia Majors [phone]

## 2012-12-07 NOTE — Progress Notes (Signed)
INITIAL NUTRITION ASSESSMENT  DOCUMENTATION CODES Per approved criteria  -Not Applicable   INTERVENTION: 1.  Supplements; Boost daily 2.  General healthful diet; encourage intake as able.   NUTRITION DIAGNOSIS: Inadequate oral intake related to fatigue as evidenced by pt report.   Monitor:  1.  Food/Beverage; pt meeting >/=90% estimated needs with tolerance. 2.  Wt/wt change; monitor trends  Reason for Assessment: MST  52 y.o. female  Admitting Dx: Pleuritic chest pain  ASSESSMENT: Pt admitted with chest pain; pleuresy vs. Anxiety.  RD met with pt and husband at bedside.  Pt states that she has had a chronic poor appetite with recent poor intake.  Pt reports she has been instructed on a high sodium diet to manage lupus and hyponatremia.   Pt reports wt loss of 15 lbs since January of 2014 mostly related to 2 episodes of illness- norovirus and dental extraction. Wt loss of 8% in 9 months is concerning but not clinically significant at this time.  RD discussed diet and nutrition-related goals.  Discussed ways to achieve.   Height: Ht Readings from Last 1 Encounters:  12/06/12 5\' 8"  (1.727 m)    Weight: Wt Readings from Last 1 Encounters:  12/06/12 138 lb 1.6 oz (62.642 kg)    Ideal Body Weight: 63.6 kg  % Ideal Body Weight: 98%  Wt Readings from Last 10 Encounters:  12/06/12 138 lb 1.6 oz (62.642 kg)  01/08/10 150 lb 6.1 oz (68.212 kg)    Usual Body Weight: 150 lbs per pt  % Usual Body Weight: 92%  BMI:  Body mass index is 21 kg/(m^2).  Estimated Nutritional Needs: Kcal: 1750-1900 Protein: 75-87g Fluid: ~1.8 L/day  Skin: intact  Diet Order: General  EDUCATION NEEDS: -Education needs addressed   Intake/Output Summary (Last 24 hours) at 12/07/12 1411 Last data filed at 12/07/12 1309  Gross per 24 hour  Intake 1151.25 ml  Output   1900 ml  Net -748.75 ml    Last BM: 9/30   Labs:   Recent Labs Lab 12/06/12 1709 12/07/12 0550  NA 138 137   K 3.8 4.1  CL 99 101  CO2 27 26  BUN 12 11  CREATININE 0.70 0.68  CALCIUM 10.7* 9.5  GLUCOSE 96 137*    CBG (last 3)   Recent Labs  12/06/12 2230  GLUCAP 111*    Scheduled Meds: . cholecalciferol  1,000 Units Oral Daily  . desmopressin  1 drop Nasal QHS  . enoxaparin (LOVENOX) injection  30 mg Subcutaneous Q24H  . gabapentin  300 mg Oral BID  . hydroxychloroquine  200 mg Oral BID  . levothyroxine  150 mcg Oral QAC breakfast  . lidocaine  1 patch Transdermal Q24H  . methylPREDNISolone (SOLU-MEDROL) injection  60 mg Intravenous Q12H  . pantoprazole  40 mg Oral BID WC  . [START ON 12/08/2012] predniSONE  20 mg Oral Q breakfast  . topiramate  100 mg Oral Daily    Continuous Infusions: . sodium chloride 75 mL/hr (12/07/12 1226)    Past Medical History  Diagnosis Date  . Fibromyalgia   . Diabetes insipidus   . Lupus   . Mitral valve prolapse   . SLE (systemic lupus erythematosus) 12/06/2012    History reviewed. No pertinent past surgical history.  Loyce Dys, MS RD LDN Clinical Inpatient Dietitian Pager: [phone] Weekend/After hours pager: [phone]

## 2012-12-07 NOTE — Progress Notes (Signed)
*  PRELIMINARY RESULTS* Echocardiogram 2D Echocardiogram has been performed.  Jeryl Columbia 12/07/2012, 3:32 PM

## 2012-12-07 NOTE — Progress Notes (Addendum)
Subjective: Admitted c Severe R Chest/Shoulder/back pain c/w lupus flare causing pleurisy vrs severe constcochonditis with anxiety and SOB.  No Shingles seen.  EKG/CXR/CTPA were all (-) 30% better than last night. Tachycardia better. Less emotional. Still hurts to take a deep breath.   Objective: Vital signs in last 24 hours: Temp:  [97.8 F (36.6 C)-98.3 F (36.8 C)] 98.1 F (36.7 C) (10/01 0536) Pulse Rate:  [85-130] 85 (10/01 0536) Resp:  [14-26] 18 (10/01 0536) BP: (105-154)/(60-97) 105/67 mmHg (10/01 0536) SpO2:  [98 %-100 %] 100 % (10/01 0536) FiO2 (%):  [21 %] 21 % (09/30 2227) Weight:  [62.642 kg (138 lb 1.6 oz)] 62.642 kg (138 lb 1.6 oz) (09/30 2228) Weight change:     CBG (last 3)   Recent Labs  12/06/12 2230  GLUCAP 111*    Intake/Output from previous day:  Intake/Output Summary (Last 24 hours) at 12/07/12 0647 Last data filed at 12/07/12 0616  Gross per 24 hour  Intake 551.25 ml  Output   1100 ml  Net -548.75 ml   09/30 0701 - 10/01 0700 In: 551.3 [I.V.:551.3] Out: 1100 [Urine:1100]   Physical Exam  General appearance: Looks better than last night. Eyes: no scleral icterus Throat: oropharynx dry without erythema Resp: CTA Cardio: Reg GI: soft, non-tender; bowel sounds normal; no masses,  no organomegaly Extremities: no clubbing, cyanosis or edema  Chest - no shingles.  R side hurts c deep breathing.  Some tender to touch.  Great paasive R Shoulder ROM but it hurts her chest to actively move it.   Lab Results:  Recent Labs  12/06/12 1709  NA 138  K 3.8  CL 99  CO2 27  GLUCOSE 96  BUN 12  CREATININE 0.70  CALCIUM 10.7*    No results found for this basename: AST, ALT, ALKPHOS, BILITOT, PROT, ALBUMIN,  in the last 72 hours   Recent Labs  12/06/12 1709 12/07/12 0550  WBC 8.5 9.4  HGB 13.5 11.7*  HCT 38.6 35.7*  MCV 83.4 85.4  PLT 276 245    Lab Results  Component Value Date   INR 0.9 04/06/2008     Recent Labs  12/07/12 0550  CKTOTAL PENDING  CKMB 1.5  TROPONINI <0.30    No results found for this basename: TSH, T4TOTAL, FREET3, T3FREE, THYROIDAB,  in the last 72 hours  No results found for this basename: VITAMINB12, FOLATE, FERRITIN, TIBC, IRON, RETICCTPCT,  in the last 72 hours  Micro Results: No results found for this or any previous visit (from the past 240 hour(s)).   Studies/Results: Dg Chest 2 View  12/06/2012   CLINICAL DATA:  Chest pain chest pain and shortness of breath.  EXAM: CHEST - 2 VIEW  COMPARISON:  07/18/2010  FINDINGS: The heart size and mediastinal contours are within normal limits. Both lungs are clear. The visualized skeletal structures are unremarkable.  IMPRESSION: No active disease.   Electronically Signed   By: Irish Lack   On: 12/06/2012 19:50   Ct Angio Chest Pe W/cm &/or Wo Cm  12/06/2012   CLINICAL DATA:  Chest pain and shortness of Breath.  EXAM: CT ANGIOGRAPHY CHEST WITH CONTRAST  TECHNIQUE: Multidetector CT imaging of the chest was performed using the standard protocol during bolus administration of intravenous contrast. Multiplanar CT image reconstructions including MIPs were obtained to evaluate the vascular anatomy.  CONTRAST:  OMNIPAQUE IOHEXOL 350 MG/ML SOLN  COMPARISON:  05/13/2010.  FINDINGS: The chest wall is unremarkable. The bony thorax  is intact. No acute bony findings or spinal canal compromise.  The heart is normal in size. No pericardial effusion. No mediastinal or hilar mass or adenopathy. Small scattered nodes are noted. The aorta is normal in caliber. No dissection. The esophagus is grossly normal.  The pulmonary arterial tree is well opacified. No filling defects to suggest pulmonary emboli.  The lungs demonstrate mild emphysematous changes and scattered lung cysts. . No acute pulmonary findings. No pleural effusion. No worrisome pulmonary lesions.  The upper abdomen is unremarkable.  Review of the MIP images confirms the above findings.   IMPRESSION: 1. No CT findings for pulmonary embolism. 2. Normal thoracic aorta. 3. No acute pulmonary findings. Stable emphysematous changes and scattered parenchymal air-filled pulmonary cysts.   Electronically Signed   By: Loralie Champagne M.D.   On: 12/06/2012 20:10     Medications: Scheduled: . cholecalciferol  1,000 Units Oral Daily  . desmopressin  1 drop Nasal QHS  . enoxaparin (LOVENOX) injection  30 mg Subcutaneous Q24H  . gabapentin  300 mg Oral BID  . hydroxychloroquine  200 mg Oral BID  . levothyroxine  150 mcg Oral QAC breakfast  . lidocaine  1 patch Transdermal Q24H  . pantoprazole  40 mg Oral BID WC  . predniSONE  20 mg Oral Q breakfast  . topiramate  100 mg Oral Daily   Continuous: . sodium chloride 75 mL/hr at 12/07/12 2952     Assessment/Plan: Principal Problem:   Pleuritic chest pain Active Problems:   SLE (systemic lupus erythematosus)   DI (diabetes insipidus)   Anxiety   SLE flare c Significant Pleuritic R sided CP c/w Pleurisy vrs costochondritis.  No Shingles seen.  If she meets inpt criteria we will move her to that as she is requiring lots of IV medications.  Check SED rate on last nights labs.   EKG (-) for Pericarditis. CTPA (-) for PE or Pulm issue. Labs (-).  Continue pain and anxiety control. Continue nacotics but try to use less IV and more PO, Continue Toradol for 1-2 more days only, Lidoderm - on 12 hrs - off 12 hrs, Neurontin in case some element of neuropathic pain, The IV steroid dose seemed to help so will do q 12 hrs today and then switch back to her usual Pred dosing and taper as per Dr Corliss Skains, IVF, Anxiolytics but switch to oral, and will follow labs and get an ECHO to R/Out Pericardial fluid/Structural heart issues. Continue Tele but she is NSR at HR 76 and better now. Home meds where they are appropriate. Continue Plaquenil. Will have Narcan available in case of need. Ck/Trop I mostly (-) but CK is P. Protonix started as she is on Prednisone.  O2 mainly for comfort.  Home today is low likliehood but tomorrow is probable.  Use Incentive spirometry.   DVT Prophylaxis    LOS: 1 day   Leasia Swann M 12/07/2012, 6:47 AM

## 2012-12-08 LAB — CBC
HCT: 36.1 % (ref 36.0–46.0)
Hemoglobin: 11.9 g/dL — ABNORMAL LOW (ref 12.0–15.0)
MCHC: 33 g/dL (ref 30.0–36.0)
MCV: 86.8 fL (ref 78.0–100.0)
WBC: 14.1 10*3/uL — ABNORMAL HIGH (ref 4.0–10.5)

## 2012-12-08 LAB — BASIC METABOLIC PANEL
BUN: 12 mg/dL (ref 6–23)
CO2: 22 mEq/L (ref 19–32)
Chloride: 107 mEq/L (ref 96–112)
GFR calc Af Amer: >90 mL/min (ref >90)
GFR calc non Af Amer: >90 mL/min (ref >90)
Potassium: 4.2 mEq/L (ref 3.5–5.1)
Sodium: 139 mEq/L (ref 135–145)

## 2012-12-08 MED ORDER — ENOXAPARIN SODIUM 40 MG/0.4ML ~~LOC~~ SOLN
40.0000 mg | SUBCUTANEOUS | Status: DC
  Filled 2012-12-08: qty 0.4

## 2012-12-08 NOTE — Discharge Summary (Signed)
Physician Discharge Summary  DISCHARGE SUMMARY   Patient ID: Stefanie [name] MR#: [id number] DOB/AGE: 1961/03/08 52 y.o.   Attending Physician:Stefanie [name]  Patient's JYN:WGNFA,OZHY M, MD  Consults:  none  Admit date: 12/06/2012 Discharge date: 12/08/2012  Discharge Diagnoses:  Principal Problem:   Pleuritic chest pain Active Problems:   SLE (systemic lupus erythematosus)   DI (diabetes insipidus)   Anxiety   Patient Active Problem List   Diagnosis Date Noted  . Pleuritic chest pain 12/06/2012  . SLE (systemic lupus erythematosus) 12/06/2012  . DI (diabetes insipidus) 12/06/2012  . Anxiety 12/06/2012  . ANEMIA-IRON DEFICIENCY 01/08/2010  . GASTRIC DILATION, ACUTE 01/08/2010  . NAUSEA ALONE 01/08/2010  . ABDOMINAL PAIN, LEFT UPPER QUADRANT 01/08/2010   Past Medical History  Diagnosis Date  . Fibromyalgia   . Diabetes insipidus   . Lupus   . Mitral valve prolapse   . SLE (systemic lupus erythematosus) 12/06/2012    Discharged Condition: stable   Discharge Medications:   Medication List         acetaminophen 500 MG tablet  Commonly known as:  TYLENOL  Take 1,000 mg by mouth every 6 (six) hours as needed for pain.     albuterol 108 (90 BASE) MCG/ACT inhaler  Commonly known as:  PROVENTIL HFA;VENTOLIN HFA  Inhale 1 puff into the lungs every 6 (six) hours as needed for wheezing or shortness of breath.     DDAVP RHINAL TUBE 0.01 % Soln  Generic drug:  desmopressin  Place 1 spray into the nose at bedtime.     gabapentin 300 MG capsule  Commonly known as:  NEURONTIN  Take 1 capsule (300 mg total) by mouth 2 (two) times daily.     hydroxychloroquine 200 MG tablet  Commonly known as:  PLAQUENIL  Take 200 mg by mouth 2 (two) times daily.     ibuprofen 200 MG tablet  Commonly known as:  ADVIL,MOTRIN  Take 400 mg by mouth every 6 (six) hours as needed for pain.     INTEGRA PLUS PO  Take 1 tablet by mouth daily.     levothyroxine 150 MCG tablet   Commonly known as:  SYNTHROID, LEVOTHROID  Take 150 mcg by mouth daily before breakfast.     lidocaine 5 %  Commonly known as:  LIDODERM  Place 1 patch onto the skin daily. Remove & Discard patch within 12 hours or as directed by MD     LORazepam 0.5 MG tablet  Commonly known as:  ATIVAN  Take 1 tablet (0.5 mg total) by mouth every 8 (eight) hours as needed for anxiety.     nitroGLYCERIN 0.4 MG SL tablet  Commonly known as:  NITROSTAT  Place 0.4 mg under the tongue every 5 (five) minutes as needed for chest pain.     oxyCODONE 5 MG immediate release tablet  Commonly known as:  Oxy IR/ROXICODONE  Take 1 tablet (5 mg total) by mouth every 4 (four) hours as needed.     pantoprazole 40 MG tablet  Commonly known as:  PROTONIX  Take 1 tablet (40 mg total) by mouth 2 (two) times daily with a meal.     predniSONE 20 MG tablet  Commonly known as:  DELTASONE  Take 1 tablet (20 mg total) by mouth daily with breakfast.     rizatriptan 10 MG tablet  Commonly known as:  MAXALT  Take 10 mg by mouth daily as needed for migraine. May repeat in 2 hours if needed  topiramate 100 MG tablet  Commonly known as:  TOPAMAX  Take 100 mg by mouth daily.     VITAMIN D PO  Take 1 capsule by mouth daily.        Hospital Procedures: Dg Chest 2 View  12/06/2012   CLINICAL DATA:  Chest pain chest pain and shortness of breath.  EXAM: CHEST - 2 VIEW  COMPARISON:  07/18/2010  FINDINGS: The heart size and mediastinal contours are within normal limits. Both lungs are clear. The visualized skeletal structures are unremarkable.  IMPRESSION: No active disease.   Electronically Signed   By: Stefanie [name]   On: 12/06/2012 19:50   Ct Angio Chest Pe W/cm &/or Wo Cm  12/06/2012   CLINICAL DATA:  Chest pain and shortness of Breath.  EXAM: CT ANGIOGRAPHY CHEST WITH CONTRAST  TECHNIQUE: Multidetector CT imaging of the chest was performed using the standard protocol during bolus administration of intravenous  contrast. Multiplanar CT image reconstructions including MIPs were obtained to evaluate the vascular anatomy.  CONTRAST:  OMNIPAQUE IOHEXOL 350 MG/ML SOLN  COMPARISON:  05/13/2010.  FINDINGS: The chest wall is unremarkable. The bony thorax is intact. No acute bony findings or spinal canal compromise.  The heart is normal in size. No pericardial effusion. No mediastinal or hilar mass or adenopathy. Small scattered nodes are noted. The aorta is normal in caliber. No dissection. The esophagus is grossly normal.  The pulmonary arterial tree is well opacified. No filling defects to suggest pulmonary emboli.  The lungs demonstrate mild emphysematous changes and scattered lung cysts. . No acute pulmonary findings. No pleural effusion. No worrisome pulmonary lesions.  The upper abdomen is unremarkable.  Review of the MIP images confirms the above findings.  IMPRESSION: 1. No CT findings for pulmonary embolism. 2. Normal thoracic aorta. 3. No acute pulmonary findings. Stable emphysematous changes and scattered parenchymal air-filled pulmonary cysts.   Electronically Signed   By: Stefanie [name] [name].D.   On: 12/06/2012 20:10    History of Present Illness:  48 F c [name] - recently saw me and her Rheum Dr Stefanie [name] as an outpt prior to presenting to the ED 12/06/12.  It was felt that she is in a lupus flare c Atypical CP she was stated on Pred Taper and her plaquenil was restarted. Discussion regarding possible MTX was entertained. Full range of outpt labs and (-) Quantiferon Gold for TB was done. TB (-). Na was 127 and with DI she held one dose of ddAVP and drank lots of gatorrade. Na much better. She called our office with CP and SOB and was very anxious. Almost sounded like a panic attack over the phone and was sent to ED for Eval and Rx. EKG Sinus Tahy and no issues. CTPA was (-) for PE and no Pulm issues. Labs unremarkable - Ca 10.7. First set of enzymes were (-). Called for inpt Observation to R/Out MI  - doubtful, pain control, anxiety control. It is felt she has sig pleuricy as Sxs worse c inspiration/expiration. Pain is mostly on the right side of her chest and she feels as if she can't take a deep breath. Denies any leg pain or swelling. No recent cough or fever. Symptoms have been persistent. Nothing makes them better. No associated rashes to her chest. Denies any anginal type pain. She has not had any diaphoresis with this. No nausea or vomiting. Patient does feel more anxious. No prior history of same. First sat was 93% - ?  Validity. O2 started and sats have been fine since. This probably represents a lupus flare c sig anxiety overlay. ? If prednisone making this worse or needs more?? She had Dilaudid and Ativan in ED. HR has been bouncing all over and Max was 130.      Hospital Course: Admitted 12/06/12 Severe R Chest/Shoulder/back pain c/w lupus flare causing pleurisy vrs severe constcochonditis with anxiety and SOB. No Shingles seen. EKG/CXR/CTPA were all (-).  ECHO showed EF 55-60% c no wall motion abnormalities, mild MR O/w (-) 70+% better than admission. Tachycardia better. HR 70 and NSR on Monitor Less emotional.  Looks much better on 12/08/12 Able to move R arm much better.  Less pains.  No SOB x with sig moving. She ruled out for MI. CK = 29. Ca improved to 9.5 Mild anemia post hydration c Hbgs 11.7 - 11.9 She was seen by Nutrition and recs to supplement Boost daily. 3 Doses of IV steroids, Topical Lidoderm, Neurontin, IV and PO Narcotics, IV Toradol - she can switch back to PO Ibuprofen as an outpatient, and other treatments were employed to help her get out of pain.  It was felt that the issue was an SLE flare c Significant Pleuritic R sided CP c/w Pleurisy vrs costochondritis. No Shingles seen. SED Rate still Pending.  EKG (-) for Pericarditis. CTPA (-) for PE or Pulm issue, ECHO was unremarkable, Tele was fine. Labs (-). Continue pain and anxiety control but b/c she is better -  she can transition to outpatient.   Lidoderm - on 12 hrs - off 12 hrs, Neurontin is being used in case some element of neuropathic pain. The IV steroid dose seemed to help the most so she received q 12 hr dosing x 3 of IV Solumedrol 60 mg and she will now switch back to her usual Pred dosing and taper as per Dr Stefanie [name].  She also received aggressive IVF.  Not being able to breath and the significant CP she had induced significant anxiety so anxiolytics were used. Continue Plaquenil.  Protonix started as she is on Prednisone. O2 was used mainly for comfort. Incentive spirometry was used to prevent ATx.   Home today as she has improved to a level that she can be managed there.     Day of Discharge Exam BP 112/71  Pulse 72  Temp(Src) 98.5 F (36.9 C) (Oral)  Resp 18  Ht 5\' 8"  (1.727 [name])  Wt 62.642 kg (138 lb 1.6 oz)  BMI 21 kg/m2  SpO2 99%  Physical Exam: General appearance: Looks much better.  Breathing comfortably.  Color has returned. Not Anxious Eyes: no scleral icterus  Throat: oropharynx moister  Resp: CTA  Cardio: Reg  GI: soft, non-tender; bowel sounds normal; no masses, no organomegaly  Extremities: no clubbing, cyanosis or edema  Chest - no shingles. R side hurts less c deep breathing. less tender to touch. Great paasive R Shoulder ROM and improved active movement   Discharge Labs:  Recent Labs  12/06/12 1709 12/07/12 0550  NA 138 137  K 3.8 4.1  CL 99 101  CO2 27 26  GLUCOSE 96 137*  BUN 12 11  CREATININE 0.70 0.68  CALCIUM 10.7* 9.5   No results found for this basename: AST, ALT, ALKPHOS, BILITOT, PROT, ALBUMIN,  in the last 72 hours  Recent Labs  12/07/12 0550 12/08/12 0655  WBC 9.4 14.1*  HGB 11.7* 11.9*  HCT 35.7* 36.1  MCV 85.4 86.8  PLT 245 251  Recent Labs  12/07/12 0550  CKTOTAL 29  CKMB 1.5  TROPONINI <0.30   No results found for this basename: TSH, T4TOTAL, FREET3, T3FREE, THYROIDAB,  in the last 72 hours No results found for this  basename: VITAMINB12, FOLATE, FERRITIN, TIBC, IRON, RETICCTPCT,  in the last 72 hours Lab Results  Component Value Date   INR 0.9 04/06/2008       Discharge instructions:  01-Home or Self Care Follow-up Information   Follow up with Gwen Pounds, MD In 5 days.   Specialty:  Internal Medicine   Contact information:   [address] [facility] [place] [address] [phone]        Disposition: home.  Hopefully she can return to work by Monday  Follow-up Appts: Follow-up with Dr. Timothy Lasso at [facility] in 5 days.  Call for appointment. Dr Stefanie [name]  Condition on Discharge: stable and improving  Tests Needing Follow-up: labs  Time spent in discharge (includes decision making & examination of pt): 35 min  Signed: Makaiyah Schweiger [name] 12/08/2012, 7:55 AM

## 2012-12-13 ENCOUNTER — Inpatient Hospital Stay (HOSPITAL_COMMUNITY): Payer: 59

## 2012-12-13 ENCOUNTER — Inpatient Hospital Stay (HOSPITAL_COMMUNITY)
Admission: EM | Admit: 2012-12-13 | Discharge: 2012-12-16 | DRG: 075 | Disposition: A | Discharge status: Home / Self Care | Payer: 59 | Attending: Internal Medicine | Admitting: Internal Medicine

## 2012-12-13 ENCOUNTER — Emergency Department (HOSPITAL_COMMUNITY): Payer: 59

## 2012-12-13 ENCOUNTER — Encounter (HOSPITAL_COMMUNITY): Payer: Self-pay | Admitting: Nurse Practitioner

## 2012-12-13 DIAGNOSIS — M329 Systemic lupus erythematosus, unspecified: Secondary | ICD-10-CM | POA: Diagnosis present

## 2012-12-13 DIAGNOSIS — E871 Hypo-osmolality and hyponatremia: Secondary | ICD-10-CM | POA: Diagnosis present

## 2012-12-13 DIAGNOSIS — I059 Rheumatic mitral valve disease, unspecified: Secondary | ICD-10-CM | POA: Diagnosis present

## 2012-12-13 DIAGNOSIS — F411 Generalized anxiety disorder: Secondary | ICD-10-CM | POA: Diagnosis present

## 2012-12-13 DIAGNOSIS — A879 Viral meningitis, unspecified: Principal | ICD-10-CM | POA: Diagnosis present

## 2012-12-13 DIAGNOSIS — E039 Hypothyroidism, unspecified: Secondary | ICD-10-CM | POA: Diagnosis present

## 2012-12-13 DIAGNOSIS — R079 Chest pain, unspecified: Secondary | ICD-10-CM | POA: Diagnosis present

## 2012-12-13 DIAGNOSIS — Z79899 Other long term (current) drug therapy: Secondary | ICD-10-CM

## 2012-12-13 DIAGNOSIS — J323 Chronic sphenoidal sinusitis: Secondary | ICD-10-CM | POA: Diagnosis present

## 2012-12-13 DIAGNOSIS — IMO0002 Reserved for concepts with insufficient information to code with codable children: Secondary | ICD-10-CM

## 2012-12-13 DIAGNOSIS — R0781 Pleurodynia: Secondary | ICD-10-CM | POA: Diagnosis present

## 2012-12-13 DIAGNOSIS — E232 Diabetes insipidus: Secondary | ICD-10-CM | POA: Diagnosis present

## 2012-12-13 DIAGNOSIS — R509 Fever, unspecified: Secondary | ICD-10-CM

## 2012-12-13 DIAGNOSIS — G43909 Migraine, unspecified, not intractable, without status migrainosus: Secondary | ICD-10-CM | POA: Diagnosis present

## 2012-12-13 DIAGNOSIS — IMO0001 Reserved for inherently not codable concepts without codable children: Secondary | ICD-10-CM | POA: Diagnosis present

## 2012-12-13 DIAGNOSIS — F419 Anxiety disorder, unspecified: Secondary | ICD-10-CM | POA: Diagnosis present

## 2012-12-13 LAB — COMPREHENSIVE METABOLIC PANEL
ALT: 24 U/L (ref 0–35)
BUN: 15 mg/dL (ref 6–23)
CO2: 26 mEq/L (ref 19–32)
Calcium: 9.4 mg/dL (ref 8.4–10.5)
Creatinine, Ser: 0.81 mg/dL (ref 0.50–1.10)
GFR calc Af Amer: >90 mL/min (ref >90)
GFR calc non Af Amer: 82 mL/min — ABNORMAL LOW (ref >90)
Glucose, Bld: 85 mg/dL (ref 70–99)
Potassium: 5.1 mEq/L (ref 3.5–5.1)

## 2012-12-13 LAB — CBC WITH DIFFERENTIAL/PLATELET
Basophils Relative: 0 % (ref 0–1)
Eosinophils Relative: 0 % (ref 0–5)
HCT: 40.4 % (ref 36.0–46.0)
Hemoglobin: 13.9 g/dL (ref 12.0–15.0)
Lymphs Abs: 2.8 10*3/uL (ref 0.7–4.0)
MCH: 29.1 pg (ref 26.0–34.0)
MCHC: 34.4 g/dL (ref 30.0–36.0)
MCV: 84.5 fL (ref 78.0–100.0)
Monocytes Absolute: 1.3 10*3/uL — ABNORMAL HIGH (ref 0.1–1.0)
Neutro Abs: 8.5 10*3/uL — ABNORMAL HIGH (ref 1.7–7.7)
Platelets: 304 10*3/uL (ref 150–400)
RBC: 4.78 MIL/uL (ref 3.87–5.11)
WBC: 12.6 10*3/uL — ABNORMAL HIGH (ref 4.0–10.5)

## 2012-12-13 LAB — URINALYSIS, ROUTINE W REFLEX MICROSCOPIC
Bilirubin Urine: NEGATIVE
Glucose, UA: NEGATIVE mg/dL
Nitrite: NEGATIVE
Specific Gravity, Urine: 1.013 (ref 1.005–1.030)
Urobilinogen, UA: 1 mg/dL (ref 0.0–1.0)

## 2012-12-13 LAB — GRAM STAIN

## 2012-12-13 LAB — TROPONIN I: Troponin I: <0.3 ng/mL (ref <0.30)

## 2012-12-13 LAB — SEDIMENTATION RATE: Sed Rate: 20 mm/hr (ref 0–22)

## 2012-12-13 MED ORDER — MIDAZOLAM HCL 2 MG/2ML IJ SOLN
2.5000 mg | Freq: Once | INTRAMUSCULAR | Status: AC
  Administered 2012-12-13: 2.5 mg via INTRAVENOUS
  Filled 2012-12-13: qty 4

## 2012-12-13 MED ORDER — MORPHINE SULFATE 4 MG/ML IJ SOLN
4.0000 mg | Freq: Once | INTRAMUSCULAR | Status: AC
  Administered 2012-12-13: 4 mg via INTRAVENOUS
  Filled 2012-12-13: qty 1

## 2012-12-13 MED ORDER — DEXTROSE 5 % IV SOLN
1.0000 g | Freq: Two times a day (BID) | INTRAVENOUS | Status: DC
  Administered 2012-12-14: 1 g via INTRAVENOUS
  Filled 2012-12-13 (×2): qty 10

## 2012-12-13 MED ORDER — SODIUM CHLORIDE 0.9 % IV SOLN
INTRAVENOUS | Status: AC | PRN
  Administered 2012-12-13: 1000 mL via INTRAVENOUS

## 2012-12-13 MED ORDER — MORPHINE SULFATE 10 MG/ML IJ SOLN
INTRAMUSCULAR | Status: AC | PRN
  Administered 2012-12-13: 4 mg via INTRAVENOUS

## 2012-12-13 MED ORDER — TOPIRAMATE 100 MG PO TABS
100.0000 mg | ORAL_TABLET | Freq: Every day | ORAL | Status: DC
  Administered 2012-12-13: 100 mg via ORAL
  Filled 2012-12-13 (×2): qty 1

## 2012-12-13 MED ORDER — GABAPENTIN 300 MG PO CAPS
300.0000 mg | ORAL_CAPSULE | Freq: Two times a day (BID) | ORAL | Status: DC
Start: 2012-12-13 — End: 2012-12-16
  Administered 2012-12-14 – 2012-12-16 (×6): 300 mg via ORAL
  Filled 2012-12-13 (×7): qty 1

## 2012-12-13 MED ORDER — ACETAMINOPHEN 500 MG PO TABS
1000.0000 mg | ORAL_TABLET | Freq: Once | ORAL | Status: AC
  Administered 2012-12-13: 1000 mg via ORAL
  Filled 2012-12-13: qty 2

## 2012-12-13 MED ORDER — ALBUTEROL SULFATE HFA 108 (90 BASE) MCG/ACT IN AERS
1.0000 | INHALATION_SPRAY | Freq: Four times a day (QID) | RESPIRATORY_TRACT | Status: DC | PRN
  Filled 2012-12-13: qty 6.7

## 2012-12-13 MED ORDER — ONDANSETRON HCL 4 MG PO TABS: 4.0000 mg | ORAL_TABLET | Freq: Four times a day (QID) | ORAL | Status: DC | PRN

## 2012-12-13 MED ORDER — HYDROXYCHLOROQUINE SULFATE 200 MG PO TABS
200.0000 mg | ORAL_TABLET | Freq: Two times a day (BID) | ORAL | Status: DC
  Filled 2012-12-13 (×3): qty 1

## 2012-12-13 MED ORDER — SODIUM CHLORIDE 0.9 % IV SOLN
1000.0000 mL | INTRAVENOUS | Status: DC
  Administered 2012-12-13 – 2012-12-14 (×3): 1000 mL via INTRAVENOUS

## 2012-12-13 MED ORDER — PREDNISONE 20 MG PO TABS
20.0000 mg | ORAL_TABLET | Freq: Every day | ORAL | Status: DC
  Administered 2012-12-14 – 2012-12-16 (×3): 20 mg via ORAL
  Filled 2012-12-13 (×4): qty 1

## 2012-12-13 MED ORDER — ONDANSETRON HCL 4 MG/2ML IJ SOLN
INTRAMUSCULAR | Status: AC
  Administered 2012-12-13: 8 mg
  Filled 2012-12-13: qty 4

## 2012-12-13 MED ORDER — METOCLOPRAMIDE HCL 5 MG/ML IJ SOLN
10.0000 mg | Freq: Once | INTRAMUSCULAR | Status: AC
  Administered 2012-12-13: 10 mg via INTRAVENOUS
  Filled 2012-12-13: qty 2

## 2012-12-13 MED ORDER — ONDANSETRON 8 MG/NS 50 ML IVPB
8.0000 mg | Freq: Once | INTRAVENOUS | Status: DC
  Filled 2012-12-13: qty 8

## 2012-12-13 MED ORDER — ACETAMINOPHEN 500 MG PO TABS
1000.0000 mg | ORAL_TABLET | Freq: Four times a day (QID) | ORAL | Status: DC | PRN
  Administered 2012-12-14 – 2012-12-15 (×4): 1000 mg via ORAL
  Filled 2012-12-13 (×5): qty 2

## 2012-12-13 MED ORDER — SUMATRIPTAN SUCCINATE 100 MG PO TABS
100.0000 mg | ORAL_TABLET | Freq: Every day | ORAL | Status: DC | PRN
  Filled 2012-12-13: qty 1

## 2012-12-13 MED ORDER — ONDANSETRON 4 MG PO TBDP
8.0000 mg | ORAL_TABLET | Freq: Once | ORAL | Status: AC
  Administered 2012-12-13: 8 mg via ORAL
  Filled 2012-12-13: qty 2

## 2012-12-13 MED ORDER — PANTOPRAZOLE SODIUM 40 MG PO TBEC
40.0000 mg | DELAYED_RELEASE_TABLET | Freq: Two times a day (BID) | ORAL | Status: DC
  Administered 2012-12-14 – 2012-12-16 (×5): 40 mg via ORAL
  Filled 2012-12-13 (×6): qty 1

## 2012-12-13 MED ORDER — DIPHENHYDRAMINE HCL 50 MG/ML IJ SOLN
25.0000 mg | Freq: Once | INTRAMUSCULAR | Status: AC
  Administered 2012-12-13: 25 mg via INTRAVENOUS
  Filled 2012-12-13: qty 1

## 2012-12-13 MED ORDER — MORPHINE SULFATE 2 MG/ML IJ SOLN: 1.0000 mg | INTRAMUSCULAR | Status: DC | PRN

## 2012-12-13 MED ORDER — SODIUM CHLORIDE 0.9 % IV SOLN
1000.0000 mL | Freq: Once | INTRAVENOUS | Status: AC
  Administered 2012-12-13: 1000 mL via INTRAVENOUS

## 2012-12-13 MED ORDER — IBUPROFEN 400 MG PO TABS
400.0000 mg | ORAL_TABLET | Freq: Four times a day (QID) | ORAL | Status: DC | PRN
  Filled 2012-12-13: qty 1

## 2012-12-13 MED ORDER — LEVOTHYROXINE SODIUM 150 MCG PO TABS
150.0000 ug | ORAL_TABLET | Freq: Every day | ORAL | Status: DC
  Administered 2012-12-14 – 2012-12-16 (×3): 150 ug via ORAL
  Filled 2012-12-13 (×5): qty 1

## 2012-12-13 MED ORDER — ONDANSETRON HCL 4 MG/2ML IJ SOLN: 4.0000 mg | Freq: Four times a day (QID) | INTRAMUSCULAR | Status: DC | PRN

## 2012-12-13 MED ORDER — MIDAZOLAM HCL 2 MG/2ML IJ SOLN
INTRAMUSCULAR | Status: AC | PRN
  Administered 2012-12-13: 2.5 mg via INTRAVENOUS

## 2012-12-13 MED ORDER — LIDOCAINE 5 % EX PTCH
1.0000 | MEDICATED_PATCH | CUTANEOUS | Status: DC
  Administered 2012-12-14 – 2012-12-16 (×3): 1 via TRANSDERMAL
  Filled 2012-12-13 (×3): qty 1

## 2012-12-13 NOTE — H&P (Signed)
Stefanie [name] is an 52 y.o. female.   PCP:   Gwen Pounds, MD   Chief Complaint:  Fever, HA.  HPI: 45 F immunosuppressed c H/O SLE on Plaquenil and prolonged taper of Prednisone prer myself and Dr Corliss Skains. She was inpt on 12/06/12 - 12/08/12 for Severe R Chest/Shoulder/back pain c/w lupus flare causing pleurisy vrs severe constcochonditis with anxiety and SOB. No Shingles seen. EKG/CXR/CTPA were all (-). She ruled out for MI.  ECHO showed EF 55-60% c no wall motion abnormalities, mild MR O/w (-).  I treated her pains and discharged her.  In the hospital 3 Doses of IV steroids seemed to help the most. Topical Lidoderm, Neurontin, IV and PO Narcotics, IV Toradol - switched back to PO Ibuprofen as an outpatient, and other treatments were employed to help her get out of pain.  She saw me yesterday 12/12/12 and was not back to baseline but looked much better today but still with pains.  R chest felt like someone walking on her chest c cleats.  R Deltoid, R Back and many ribs hurt. R arm and shoulder some better but some reduced ROM  min SOB.  On D/c her Ca 9.5, Mild anemia post hydration c Hbgs 11.7 - 11.9.  Labs yesterday were all perfect. BMET is fine.  CBC was normal c WBC 7.5 and Hbg 13.3.  Sed Rate 25. Rxed c Depomedrol 80 IM yesterday No rash was ever found to suggest Shingles.  No Ab issues seen prior.  Last night she awoke c fever up to 102.5 and HA/Migrain.  She had issues c N/V.  She was given Maxalt but she threw it up.  They called this am to me c Sxs of chest and ribs hurting. Fever up to 102.5. N/V. Migraine. Mild Cough.  No Sputum.  Dry. No Drainage. No Rash. Getting DeHydration. I Advised her to [facility] ED and called ahead.   Eval in ED showed CMET fine even c Na 131, K 5.1.  CBC now with WBC 12.6 but rest of CBC fine.  CXR Chronic lower lobe predominant interstitial thickening, without  acute superimposed process. Bllod Cxs x 2 done. UA (-). U Cx P.  CCT P; LP Pending. Trop I (-). Sed  rate only 20.  I asked EDP to get ID Input.  Dr Lynelle Doctor talked c 14:31 Dr Luciana Axe, states he doubts meningitis but she could have encephalitis, states MR would only be + if has HSV, thinks LP would be better, in addition test for HSV and enterovirus on spinal fluid. States could just be her lupus flare or influenza recommends flu test.   Given IVF, Zofran, Reglan  Seen in Ed At @ 1:30 and looks tired.  R Hip sore from shot yesterday but not red or hot. HA and GI some better after the medicines. No overt meningeal signs, No Focal infectious etiologies.  No Local or focal joint issues as well.  No Focal Septic Arthralgia.    Past Medical History:  Past Medical History  Diagnosis Date  . Fibromyalgia   . Diabetes insipidus   . Lupus   . Mitral valve prolapse   . SLE (systemic lupus erythematosus) 12/06/2012    History reviewed. No pertinent past surgical history.    Allergies:   Allergies  Allergen Reactions  . Venofer [Ferric Oxide] Palpitations    Dizziness, sweating, "felt like I was going to pass out" unknown when episode happened  . Azithromycin Other (See Comments)    "couldnt  sleep"     Medications: Prior to Admission medications   Medication Sig Start Date End Date Taking? Authorizing Provider  Cholecalciferol (VITAMIN D PO) Take 1 capsule by mouth daily.   Yes Historical Provider, MD  desmopressin (DDAVP RHINAL TUBE) 0.01 % SOLN Place 1 spray into the nose at bedtime.   Yes Historical Provider, MD  FeFum-FePoly-FA-B Cmp-C-Biot (INTEGRA PLUS PO) Take 1 tablet by mouth daily.   Yes Historical Provider, MD  gabapentin (NEURONTIN) 300 MG capsule Take 1 capsule (300 mg total) by mouth 2 (two) times daily. 12/07/12  Yes Gwen Pounds, MD  hydroxychloroquine (PLAQUENIL) 200 MG tablet Take 200 mg by mouth 2 (two) times daily.   Yes Historical Provider, MD  ibuprofen (ADVIL,MOTRIN) 200 MG tablet Take 400 mg by mouth every 6 (six) hours as needed for pain.   Yes Historical  Provider, MD  levothyroxine (SYNTHROID, LEVOTHROID) 150 MCG tablet Take 150 mcg by mouth daily before breakfast.   Yes Historical Provider, MD  oxyCODONE (OXY IR/ROXICODONE) 5 MG immediate release tablet Take 1 tablet (5 mg total) by mouth every 4 (four) hours as needed. 12/07/12  Yes Gwen Pounds, MD  pantoprazole (PROTONIX) 40 MG tablet Take 1 tablet (40 mg total) by mouth 2 (two) times daily with a meal. 12/07/12  Yes Gwen Pounds, MD  predniSONE (DELTASONE) 20 MG tablet Take 1 tablet (20 mg total) by mouth daily with breakfast. 12/08/12  Yes Gwen Pounds, MD  rizatriptan (MAXALT) 10 MG tablet Take 10 mg by mouth daily as needed for migraine. May repeat in 2 hours if needed   Yes Historical Provider, MD  topiramate (TOPAMAX) 100 MG tablet Take 100 mg by mouth daily.   Yes Historical Provider, MD  acetaminophen (TYLENOL) 500 MG tablet Take 1,000 mg by mouth every 6 (six) hours as needed for pain.    Historical Provider, MD  albuterol (PROVENTIL HFA;VENTOLIN HFA) 108 (90 BASE) MCG/ACT inhaler Inhale 1 puff into the lungs every 6 (six) hours as needed for wheezing or shortness of breath.    Historical Provider, MD  lidocaine (LIDODERM) 5 % Place 1 patch onto the skin daily. Remove & Discard patch within 12 hours or as directed by MD 12/07/12   Gwen Pounds, MD  LORazepam (ATIVAN) 0.5 MG tablet Take 1 tablet (0.5 mg total) by mouth every 8 (eight) hours as needed for anxiety. 12/07/12   Gwen Pounds, MD  nitroGLYCERIN (NITROSTAT) 0.4 MG SL tablet Place 0.4 mg under the tongue every 5 (five) minutes as needed for chest pain.    Historical Provider, MD      (Not in a hospital admission)   Social History:  reports that she has never smoked. She does not have any smokeless tobacco history on file. She reports that she does not drink alcohol or use illicit drugs.  Family History: History reviewed. No pertinent family history.  Review of Systems:  Review of Systems - + CP. +Pleuricy, + rib/Shoulder/R  Breast and R buttock pains.  Mild SOB.  See HPI  Anxiety, fear Migraine HAs. Weakness AFTT.  4 Pound weight loss in one week Full ROS Obtained  Physical Exam:  Blood pressure 100/58, pulse 81, temperature 102.6 F (39.2 C), temperature source Oral, resp. rate 14, height 5\' 7"  (1.702 m), weight 63.504 kg (140 lb), SpO2 99.00%. Filed Vitals:   12/13/12 1653 12/13/12 1656 12/13/12 1700 12/13/12 1706  BP: 91/55 91/54 89/55  100/58  Pulse: 75 77 75 81  Temp:      TempSrc:      Resp: 11 11 11 14   Height:      Weight:      SpO2: 97% 97% 96% 99%   General appearance: Weak, frail, tired. Head: Normocephalic, without obvious abnormality, atraumatic Eyes: conjunctivae/corneas clear. PERRL, EOM's intact.  Nose: Nares normal. Septum midline. Mucosa normal. No drainage or sinus tenderness. OP dry.  Min white flakes.  No Ob Thrush Neck: no adenopathy, no carotid bruit, no JVD and thyroid not enlarged, symmetric, no tenderness/mass/nodules Resp: CTA Cardio: Reg GI: soft, non-tender; bowel sounds normal; no masses,  no organomegaly Extremities: extremities normal, atraumatic, no cyanosis or edema Pulses: 2+ and symmetric Lymph nodes:  no cervical lymphadenopathy Neurologic: Alert and oriented X 3, normal strength and tone. Normal symmetric reflexes.  Has Wet facecloth over her eyes. R Buttock min warm where injection went in but no Knot or cellulitis. Decreased ROM R Shoulder. No Shingles lesions noted. No Meningeal signs.    Labs on Admission:   Recent Labs  12/13/12 1028  NA 131*  K 5.1  CL 92*  CO2 26  GLUCOSE 85  BUN 15  CREATININE 0.81  CALCIUM 9.4    Recent Labs  12/13/12 1028  AST 35  ALT 24  ALKPHOS 99  BILITOT 0.6  PROT 9.3*  ALBUMIN 4.0   No results found for this basename: LIPASE, AMYLASE,  in the last 72 hours  Recent Labs  12/13/12 1028  WBC 12.6*  NEUTROABS 8.5*  HGB 13.9  HCT 40.4  MCV 84.5  PLT 304    Recent Labs  12/13/12 1400  TROPONINI  <0.30   Lab Results  Component Value Date   INR 0.9 04/06/2008     LAB RESULT POCT:  Results for orders placed during the hospital encounter of 12/13/12  CBC WITH DIFFERENTIAL      Result Value Range   WBC 12.6 (*) 4.0 - 10.5 K/uL   RBC 4.78  3.87 - 5.11 MIL/uL   Hemoglobin 13.9  12.0 - 15.0 g/dL   HCT 16.1  09.6 - 04.5 %   MCV 84.5  78.0 - 100.0 fL   MCH 29.1  26.0 - 34.0 pg   MCHC 34.4  30.0 - 36.0 g/dL   RDW 40.9  81.1 - 91.4 %   Platelets 304  150 - 400 K/uL   Neutrophils Relative % 68  43 - 77 %   Lymphocytes Relative 22  12 - 46 %   Monocytes Relative 10  3 - 12 %   Eosinophils Relative 0  0 - 5 %   Basophils Relative 0  0 - 1 %   Neutro Abs 8.5 (*) 1.7 - 7.7 K/uL   Lymphs Abs 2.8  0.7 - 4.0 K/uL   Monocytes Absolute 1.3 (*) 0.1 - 1.0 K/uL   Eosinophils Absolute 0.0  0.0 - 0.7 K/uL   Basophils Absolute 0.0  0.0 - 0.1 K/uL   WBC Morphology ATYPICAL LYMPHOCYTES    COMPREHENSIVE METABOLIC PANEL      Result Value Range   Sodium 131 (*) 135 - 145 mEq/L   Potassium 5.1  3.5 - 5.1 mEq/L   Chloride 92 (*) 96 - 112 mEq/L   CO2 26  19 - 32 mEq/L   Glucose, Bld 85  70 - 99 mg/dL   BUN 15  6 - 23 mg/dL   Creatinine, Ser 7.82  0.50 - 1.10 mg/dL   Calcium 9.4  8.4 -  10.5 mg/dL   Total Protein 9.3 (*) 6.0 - 8.3 g/dL   Albumin 4.0  3.5 - 5.2 g/dL   AST 35  0 - 37 U/L   ALT 24  0 - 35 U/L   Alkaline Phosphatase 99  39 - 117 U/L   Total Bilirubin 0.6  0.3 - 1.2 mg/dL   GFR calc non Af Amer 82 (*) >90 mL/min   GFR calc Af Amer >90  >90 mL/min  URINALYSIS, ROUTINE W REFLEX MICROSCOPIC      Result Value Range   Color, Urine YELLOW  YELLOW   APPearance CLOUDY (*) CLEAR   Specific Gravity, Urine 1.013  1.005 - 1.030   pH 7.5  5.0 - 8.0   Glucose, UA NEGATIVE  NEGATIVE mg/dL   Hgb urine dipstick NEGATIVE  NEGATIVE   Bilirubin Urine NEGATIVE  NEGATIVE   Ketones, ur NEGATIVE  NEGATIVE mg/dL   Protein, ur NEGATIVE  NEGATIVE mg/dL   Urobilinogen, UA 1.0  0.0 - 1.0 mg/dL    Nitrite NEGATIVE  NEGATIVE   Leukocytes, UA NEGATIVE  NEGATIVE  SEDIMENTATION RATE      Result Value Range   Sed Rate 20  0 - 22 mm/hr  TROPONIN I      Result Value Range   Troponin I <0.30  <0.30 ng/mL      Radiological Exams on Admission: Dg Chest 2 View  12/13/2012   CLINICAL DATA:  Fever.  Lupus.  EXAM: CHEST  2 VIEW  COMPARISON:  12/06/2012 plain film and CT.  FINDINGS: Midline trachea. Normal heart size and mediastinal contours. No pleural effusion or pneumothorax. Biapical pleural parenchymal scarring. Lower lobe predominant interstitial thickening. Similar. No lobar consolidation.  IMPRESSION: Chronic lower lobe predominant interstitial thickening, without acute superimposed process.   Electronically Signed   By: Jeronimo Greaves M.D.   On: 12/13/2012 10:56      Orders placed during the hospital encounter of 12/06/12  . EKG 12-LEAD  . EKG 12-LEAD  . EKG     Assessment/Plan Principal Problem:   Fever Active Problems:   Pleuritic chest pain   SLE (systemic lupus erythematosus)   DI (diabetes insipidus)   Anxiety  Fever in relatively immunosuppressed SLE pt on Plaquenil and Pred/Depomedrol/Solumedrol S/p recent inpt admission for Pleurisy/Chest Pain/Costochondritis c (-) CTPA/(-) ECHO/(-) EKG for Pericarditis and Decent labs. Current Febrile illness without exact source.  Could just be Viral and this just may be 2nd unrelated issue Area of Injection yesterday without obvious cellulitis/Abscess. Current X Ray and labs not diagnostic. Consider ID Consultation. Continue Sxatic control. No Meningeal Signs but Severe HA/Migraine concerning No Shingles seen  LP per Dr Lynelle Doctor failed and Dr Lynelle Doctor has been discussing c IR to get LP Fluoro guided tonight. Niece had high Fever recently  Eval in ED showed CMET fine even c Na 131, K 5.1.  CBC now with WBC 12.6 but rest of CBC fine.  CXR Chronic lower lobe predominant interstitial thickening, without  acute superimposed process.  Blood Cxs x 2 done. UA (-). U Cx P.  CCT P; LP Pending. Trop I (-). Sed rate only 20.  Dr Luciana Axe was contacted by EDP and states he doubts meningitis but she could have encephalitis, states MR would only be + if has HSV, thinks LP would be better test.  VERY Low Prob of HSV without clinical worsening but if getting LP get usual tests + HSV and enterovirus on spinal fluid. States could just be her lupus flare  or influenza recommends flu test.   Admit and supportive care at this time.  Will get CCT and IR LP today.  Hold Abx unless LP indicates.  Continue Home meds.   Kathline Banbury M 12/13/2012, 5:13 PM

## 2012-12-13 NOTE — ED Notes (Signed)
Dr Lynelle Doctor aware of BP prior to medication administration. Stated to give NS w/o and to give ordered sedation medication. Close bp monitoring continued. Pt placed on 100 nrb precautionary no respiratory distress noted.

## 2012-12-13 NOTE — ED Notes (Signed)
Dr Lynelle Doctor unable to obtain specimen. Called Radiology for interventional to attempt. Family at bedside aware of plan of care.

## 2012-12-13 NOTE — ED Notes (Signed)
Pt presents with 1 week h/o frontal headache, body aches, nausea and vomiting.  Pt seen here x 1 week ago with chest pain, was admitted and discharged with no diagnosis despite complete workup.  Pt was seen at PCP for follow with continued pain, was started on prednisone - pt reports onset of fever today and referred here.  Pt reports generalized weakness, nausea and vomiting - has been unable to keep migraine medication down (maxalt).  Pt is GCS 15, reports relief from nausea with zofran given today.

## 2012-12-13 NOTE — ED Notes (Signed)
Zofran given prior to procedure no nausea noted.

## 2012-12-13 NOTE — Procedures (Signed)
Technically successful fluoro guided LP with opening pressure of 22 mmHg and closing pressure of 14 mmHg. @ 12 cc of clear CSF sent to lab for analysis. No immediate post procedural complication

## 2012-12-13 NOTE — ED Notes (Signed)
Pt sent from dr Timothy Lasso for further workup of fever, migraine headache, vomiting onset this am. Pt being treated for lupus with immunosuppressive drugs.

## 2012-12-13 NOTE — ED Provider Notes (Signed)
CSN: [id number]     Arrival date & time 12/13/12  1013 History   First MD Initiated Contact with Patient 12/13/12 1104     Chief Complaint  Patient presents with  . Fever   (Consider location/radiation/quality/duration/timing/severity/associated sxs/prior Treatment) HPI She relates she was admitted one week ago when she was having chest pain. Review of that admission showed she had negative troponins, a normal CT anterior chest, and a normal echocardiogram with a ejection fraction of 55-60%. She was seen yesterday in followup by her PCP and had lab done that were all reportedly normal. She reports about 1 AM this morning she started getting a headache on the top of her head and radiates into the area behind her eyes. She states she has a history of migraine headaches that are similar. She states she took Maxalt which normally helps however she vomited it within an hour and had no relief of her headache. She states her headache currently is dull and pulsating and throbbing and that at times she can hear her heart beating. She took a second Maxalt about 3 AM and also vomited it. Her husband reports he checked her temperature at 1:15 this morning and it was 102. At 8:30 this morning it was 102.5. Patient does report she got a steroid injection yesterday about lunchtime. She has had nausea and vomiting about 3-6 times today. She denies diarrhea. She states her headache is made worse by light and noise which she's had before with her migraines. She denies any numbness or tingling in her arms and legs although she has had it in the past. She denies any neck pain. She states she continues to have a central chest pain that radiates into her right lateral chest. This pain is worse with movement of her right arm. She also feels short of breath and the pain in her chest hurts with coughing although she's not having a cough. She denies rhinorrhea. She states she feels very weak. She states she has chronic dry mouth.  She also reports when she saw her doctor yesterday she had lost 4 pounds in one week. Patient has a history of lupus and she was starting to have pain in her knee joints and in the middle of September she was started on prednisone and plaquenil  PCP Dr Timothy Lasso Rheumatology Dr Corliss Skains  Past Medical History  Diagnosis Date  . Fibromyalgia   . Diabetes insipidus   . Lupus   . Mitral valve prolapse   . SLE (systemic lupus erythematosus) 12/06/2012   History reviewed. No pertinent past surgical history. History reviewed. No pertinent family history. History  Substance Use Topics  . Smoking status: Never Smoker   . Smokeless tobacco: Not on file  . Alcohol Use: No   Lives at home Lives with spouse employed   OB History   Grav Para Term Preterm Abortions TAB SAB Ect Mult Living                 Review of Systems  All other systems reviewed and are negative.    Allergies  Venofer and Azithromycin  Home Medications   Current Outpatient Rx  Name  Route  Sig  Dispense  Refill  . Cholecalciferol (VITAMIN D PO)   Oral   Take 1 capsule by mouth daily.         [name] desmopressin (DDAVP RHINAL TUBE) 0.01 % SOLN   Nasal   Place 1 spray into the nose at bedtime.         [name]  FeFum-FePoly-FA-B Cmp-C-Biot (INTEGRA PLUS PO)   Oral   Take 1 tablet by mouth daily.         [name] gabapentin (NEURONTIN) 300 MG capsule   Oral   Take 1 capsule (300 mg total) by mouth 2 (two) times daily.   40 capsule   0   . hydroxychloroquine (PLAQUENIL) 200 MG tablet   Oral   Take 200 mg by mouth 2 (two) times daily.         [name] ibuprofen (ADVIL,MOTRIN) 200 MG tablet   Oral   Take 400 mg by mouth every 6 (six) hours as needed for pain.         [name] levothyroxine (SYNTHROID, LEVOTHROID) 150 MCG tablet   Oral   Take 150 mcg by mouth daily before breakfast.         . oxyCODONE (OXY IR/ROXICODONE) 5 MG immediate release tablet   Oral   Take 1 tablet (5 mg total) by mouth every 4 (four) hours as  needed.   30 tablet   0   . pantoprazole (PROTONIX) 40 MG tablet   Oral   Take 1 tablet (40 mg total) by mouth 2 (two) times daily with a meal.   60 tablet   0   . predniSONE (DELTASONE) 20 MG tablet   Oral   Take 1 tablet (20 mg total) by mouth daily with breakfast.         . rizatriptan (MAXALT) 10 MG tablet   Oral   Take 10 mg by mouth daily as needed for migraine. May repeat in 2 hours if needed         . topiramate (TOPAMAX) 100 MG tablet   Oral   Take 100 mg by mouth daily.         [name] acetaminophen (TYLENOL) 500 MG tablet   Oral   Take 1,000 mg by mouth every 6 (six) hours as needed for pain.         [name] albuterol (PROVENTIL HFA;VENTOLIN HFA) 108 (90 BASE) MCG/ACT inhaler   Inhalation   Inhale 1 puff into the lungs every 6 (six) hours as needed for wheezing or shortness of breath.         . lidocaine (LIDODERM) 5 %   Transdermal   Place 1 patch onto the skin daily. Remove & Discard patch within 12 hours or as directed by MD   30 patch   0   . LORazepam (ATIVAN) 0.5 MG tablet   Oral   Take 1 tablet (0.5 mg total) by mouth every 8 (eight) hours as needed for anxiety.   30 tablet   0   . nitroGLYCERIN (NITROSTAT) 0.4 MG SL tablet   Sublingual   Place 0.4 mg under the tongue every 5 (five) minutes as needed for chest pain.          BP 118/75  Pulse 85  Temp(Src) 101.1 F (38.4 C) (Oral)  Resp 18  Ht 5\' 7"  (1.702 m)  Wt 140 lb (63.504 kg)  BMI 21.92 kg/m2  SpO2 99%  Vital signs normal except for fever  Physical Exam  Nursing note and vitals reviewed. Constitutional: She is oriented to person, place, and time. She appears well-developed and well-nourished.  Non-toxic appearance. She does not appear ill. She appears distressed.  HENT:  Head: Normocephalic and atraumatic.  Right Ear: External ear normal.  Left Ear: External ear normal.  Nose: Nose normal. No mucosal edema or rhinorrhea.  Mouth/Throat: Oropharynx is clear and  moist and mucous  membranes are normal. No dental abscesses or edematous.  Eyes: Conjunctivae and EOM are normal. Pupils are equal, round, and reactive to light.  Neck: Normal range of motion and full passive range of motion without pain. Neck supple. Normal range of motion present.  Pt moving head freely without restriction or discomfort, no nuchal rigidity  Cardiovascular: Normal rate, regular rhythm and normal heart sounds.  Exam reveals no gallop and no friction rub.   No murmur heard. Pulmonary/Chest: Effort normal and breath sounds normal. No respiratory distress. She has no wheezes. She has no rhonchi. She has no rales. She exhibits no tenderness and no crepitus.  Abdominal: Soft. Normal appearance and bowel sounds are normal. She exhibits no distension. There is no tenderness. There is no rebound and no guarding.  Musculoskeletal: Normal range of motion. She exhibits no edema and no tenderness.  Moves all extremities well. Patient has no swelling, redness, or joint effusions of her major joints.  Neurological: She is alert and oriented to person, place, and time. She has normal strength. No cranial nerve deficit.  Skin: Skin is warm, dry and intact. No rash noted. No erythema. No pallor.  Psychiatric: She has a normal mood and affect. Her speech is normal and behavior is normal. Her mood appears not anxious.    ED Course  Procedures (including critical care time)  13: 75 Dr Timothy Lasso came by to see patient, asks to talk to ID when her labs return  14:31 Dr Luciana Axe, states he doubts meningitis but she could have encephalitis, states MR would only be + if has HSV, thinks LP would be better, in addition test for HSV and enterovirus on spinal fluid. States could just be her lupus flare or influenza recommends flu test. He does not think herpes encephalitis is high on the list because she should be getting progressively worse.   Pt was placed on a monitor and NRM. She was given IV morphine, zofran (has had nausea  and vomiting from morphine in the past) and versed. She was also on pulse ox monitor.   16:40 Pt was sterilly prepper and draped and local injection of xylocaine 1 %. I was unable to obtain spinal fluid, pt did have bleeding from the first site.  I talked to Dr Grace Isaac in Radiology, he would prefer to wait until tomorrow to do LP, but will proceed.   Pt had mild hypotension after given meds for sedation. Given IV fluids.   Husband has talked to family, pt's niece (97 yo) started getting fever and headache 3 days ago. Pt is also employed in a daycare.   Dr Grace Isaac called back, wants a head CT before the LP.  17:27 Dr Timothy Lasso given update, he is going to admit.   17:53 Dr Timothy Lasso, wants to make sure radiology does LP tonight.   19:46 CT head still pending, I spoke to Dr Grace Isaac to relay Dr Timothy Lasso wants the LP to be done tonight.   Labs Review Results for orders placed during the hospital encounter of 12/13/12  CBC WITH DIFFERENTIAL      Result Value Range   WBC 12.6 (*) 4.0 - 10.5 K/uL   RBC 4.78  3.87 - 5.11 MIL/uL   Hemoglobin 13.9  12.0 - 15.0 g/dL   HCT 11.9  14.7 - 82.9 %   MCV 84.5  78.0 - 100.0 fL   MCH 29.1  26.0 - 34.0 pg   MCHC 34.4  30.0 - 36.0 g/dL  RDW 14.6  11.5 - 15.5 %   Platelets 304  150 - 400 K/uL   Neutrophils Relative % 68  43 - 77 %   Lymphocytes Relative 22  12 - 46 %   Monocytes Relative 10  3 - 12 %   Eosinophils Relative 0  0 - 5 %   Basophils Relative 0  0 - 1 %   Neutro Abs 8.5 (*) 1.7 - 7.7 K/uL   Lymphs Abs 2.8  0.7 - 4.0 K/uL   Monocytes Absolute 1.3 (*) 0.1 - 1.0 K/uL   Eosinophils Absolute 0.0  0.0 - 0.7 K/uL   Basophils Absolute 0.0  0.0 - 0.1 K/uL   WBC Morphology ATYPICAL LYMPHOCYTES    COMPREHENSIVE METABOLIC PANEL      Result Value Range   Sodium 131 (*) 135 - 145 mEq/L   Potassium 5.1  3.5 - 5.1 mEq/L   Chloride 92 (*) 96 - 112 mEq/L   CO2 26  19 - 32 mEq/L   Glucose, Bld 85  70 - 99 mg/dL   BUN 15  6 - 23 mg/dL   Creatinine, Ser 1.61  0.50 -  1.10 mg/dL   Calcium 9.4  8.4 - 09.6 mg/dL   Total Protein 9.3 (*) 6.0 - 8.3 g/dL   Albumin 4.0  3.5 - 5.2 g/dL   AST 35  0 - 37 U/L   ALT 24  0 - 35 U/L   Alkaline Phosphatase 99  39 - 117 U/L   Total Bilirubin 0.6  0.3 - 1.2 mg/dL   GFR calc non Af Amer 82 (*) >90 mL/min   GFR calc Af Amer >90  >90 mL/min  URINALYSIS, ROUTINE W REFLEX MICROSCOPIC      Result Value Range   Color, Urine YELLOW  YELLOW   APPearance CLOUDY (*) CLEAR   Specific Gravity, Urine 1.013  1.005 - 1.030   pH 7.5  5.0 - 8.0   Glucose, UA NEGATIVE  NEGATIVE mg/dL   Hgb urine dipstick NEGATIVE  NEGATIVE   Bilirubin Urine NEGATIVE  NEGATIVE   Ketones, ur NEGATIVE  NEGATIVE mg/dL   Protein, ur NEGATIVE  NEGATIVE mg/dL   Urobilinogen, UA 1.0  0.0 - 1.0 mg/dL   Nitrite NEGATIVE  NEGATIVE   Leukocytes, UA NEGATIVE  NEGATIVE  SEDIMENTATION RATE      Result Value Range   Sed Rate 20  0 - 22 mm/hr  TROPONIN I      Result Value Range   Troponin I <0.30  <0.30 ng/mL   Laboratory interpretation all normal except mild leukocytosis (on steroids), low sodium and chloride   Imaging Review  Dg Chest 2 View  CT head pending.  12/13/2012   CLINICAL DATA:  Fever.  Lupus.  EXAM: CHEST  2 VIEW  COMPARISON:  12/06/2012 plain film and CT.  FINDINGS: Midline trachea. Normal heart size and mediastinal contours. No pleural effusion or pneumothorax. Biapical pleural parenchymal scarring. Lower lobe predominant interstitial thickening. Similar. No lobar consolidation.  IMPRESSION: Chronic lower lobe predominant interstitial thickening, without acute superimposed process.   Electronically Signed   By: Jeronimo Greaves M.D.   On: 12/13/2012 10:56   Dg Chest 2 View  12/06/2012   .  IMPRESSION: No active disease.   Electronically Signed   By: Irish Lack   On: 12/06/2012 19:50   Ct Angio Chest Pe W/cm &/or Wo Cm  12/06/2012   .  IMPRESSION: 1. No CT findings for pulmonary embolism.  2. Normal thoracic aorta. 3. No acute pulmonary  findings. Stable emphysematous changes and scattered parenchymal air-filled pulmonary cysts.   Electronically Signed   By: Loralie Champagne M.D.   On: 12/06/2012 20:10    MDM   1. Fever   2. Headache     Plan admission   Ward Givens, MD 12/13/12 1949

## 2012-12-13 NOTE — ED Notes (Signed)
Pt states she is unable to tolerate oral intake now she has been vomiting her meds this am, no tylenol given at this time

## 2012-12-14 DIAGNOSIS — F29 Unspecified psychosis not due to a substance or known physiological condition: Secondary | ICD-10-CM

## 2012-12-14 DIAGNOSIS — R51 Headache: Secondary | ICD-10-CM

## 2012-12-14 DIAGNOSIS — R509 Fever, unspecified: Secondary | ICD-10-CM

## 2012-12-14 DIAGNOSIS — D72829 Elevated white blood cell count, unspecified: Secondary | ICD-10-CM

## 2012-12-14 DIAGNOSIS — M436 Torticollis: Secondary | ICD-10-CM

## 2012-12-14 LAB — BASIC METABOLIC PANEL
BUN: 13 mg/dL (ref 6–23)
Calcium: 8.8 mg/dL (ref 8.4–10.5)
Creatinine, Ser: 0.79 mg/dL (ref 0.50–1.10)
GFR calc Af Amer: >90 mL/min (ref >90)
GFR calc non Af Amer: >90 mL/min (ref >90)
Glucose, Bld: 94 mg/dL (ref 70–99)
Potassium: 4.1 mEq/L (ref 3.5–5.1)

## 2012-12-14 LAB — URINE CULTURE
Colony Count: NO GROWTH
Culture: NO GROWTH

## 2012-12-14 LAB — CBC
Hemoglobin: 11.8 g/dL — ABNORMAL LOW (ref 12.0–15.0)
MCH: 27.6 pg (ref 26.0–34.0)
MCHC: 32.2 g/dL (ref 30.0–36.0)
RDW: 14.3 % (ref 11.5–15.5)
WBC: 7.3 10*3/uL (ref 4.0–10.5)

## 2012-12-14 LAB — CSF CELL COUNT WITH DIFFERENTIAL
Eosinophils, CSF: 1 % (ref 0–1)
Eosinophils, CSF: 1 % (ref 0–1)
Lymphs, CSF: 31 % — ABNORMAL LOW (ref 40–80)
Monocyte-Macrophage-Spinal Fluid: 18 % (ref 15–45)
Monocyte-Macrophage-Spinal Fluid: 28 % (ref 15–45)
RBC Count, CSF: 3 /mm3 — ABNORMAL HIGH
Segmented Neutrophils-CSF: 40 % — ABNORMAL HIGH (ref 0–6)
Tube #: 1
Tube #: 4
WBC, CSF: 186 /mm3 (ref 0–5)

## 2012-12-14 LAB — PATHOLOGIST SMEAR REVIEW

## 2012-12-14 MED ORDER — SODIUM CHLORIDE 0.9 % IV SOLN
1000.0000 mL | INTRAVENOUS | Status: DC
  Administered 2012-12-14 – 2012-12-15 (×2): 1000 mL via INTRAVENOUS

## 2012-12-14 MED ORDER — SODIUM CHLORIDE 0.9 % IV SOLN
2.0000 g | INTRAVENOUS | Status: DC
  Administered 2012-12-14: 2 g via INTRAVENOUS
  Filled 2012-12-14 (×5): qty 2000

## 2012-12-14 MED ORDER — DEXTROSE 5 % IV SOLN
2.0000 g | Freq: Three times a day (TID) | INTRAVENOUS | Status: DC
  Administered 2012-12-14: 2 g via INTRAVENOUS
  Filled 2012-12-14 (×3): qty 2

## 2012-12-14 MED ORDER — VANCOMYCIN HCL IN DEXTROSE 1-5 GM/200ML-% IV SOLN
1000.0000 mg | Freq: Two times a day (BID) | INTRAVENOUS | Status: DC
  Filled 2012-12-14 (×2): qty 200

## 2012-12-14 MED ORDER — BOOST PLUS PO LIQD
237.0000 mL | Freq: Three times a day (TID) | ORAL | Status: DC
  Administered 2012-12-14: 18:00:00 237 mL via ORAL
  Filled 2012-12-14 (×10): qty 237

## 2012-12-14 MED ORDER — DEXTROSE 5 % IV SOLN
630.0000 mg | Freq: Three times a day (TID) | INTRAVENOUS | Status: DC
  Administered 2012-12-14 – 2012-12-16 (×6): 630 mg via INTRAVENOUS
  Filled 2012-12-14 (×9): qty 12.6

## 2012-12-14 NOTE — Progress Notes (Signed)
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 Venofer [Ferric Oxide] Palpitations    Dizziness, sweating, "felt like I was going to pass out" unknown when episode happened  . Azithromycin Other (See Comments)    "couldnt sleep"    Patient Measurements: Height: 5\' 7"  (170.2 cm) Weight: 140 lb (63.504 kg) IBW/kg (Calculated) : 61.6 Adjusted Body Weight:   Vital Signs: Temp: 98.5 F (36.9 C) (10/08 1236) Temp src: Oral (10/08 1236) BP: 114/64 mmHg (10/08 0830) Pulse Rate: 75 (10/08 1236) Intake/Output from previous day: 10/07 0701 - 10/08 0700 In: 360 [P.O.:360] Out: -  Intake/Output from this shift: Total I/O In: 480 [P.O.:480] Out: 1250 [Urine:1250]  Labs:  Recent Labs  12/13/12 1028 12/14/12 0715  WBC 12.6* 7.3  HGB 13.9 11.8*  PLT 304 298  CREATININE 0.81 0.79   Estimated Creatinine Clearance: 80 ml/min (by C-G formula based on Cr of 0.79). No results found for this basename: VANCOTROUGH, VANCOPEAK, VANCORANDOM, GENTTROUGH, GENTPEAK, GENTRANDOM, TOBRATROUGH, TOBRAPEAK, TOBRARND, AMIKACINPEAK, AMIKACINTROU, AMIKACIN,  in the last 72 hours   Microbiology: Recent Results (from the past 720 hour(s))  CULTURE, BLOOD (ROUTINE X 2)     Status: None   Collection Time    12/13/12 11:15 AM      Result Value Range Status   Specimen Description BLOOD RIGHT WRIST   Final   Special Requests BOTTLES DRAWN AEROBIC AND ANAEROBIC 10CC   Final   Culture  Setup Time     Final   Value: 12/13/2012 21:01     Performed at [facility]   Culture     Final   Value:        BLOOD CULTURE RECEIVED NO GROWTH TO DATE CULTURE WILL BE HELD FOR 5 DAYS BEFORE ISSUING A FINAL NEGATIVE REPORT     Performed at [facility]   Report Status PENDING   Incomplete  CULTURE, BLOOD (ROUTINE X 2)     Status: None   Collection Time    12/13/12  2:21 PM      Result Value Range Status   Specimen Description BLOOD LEFT FOREARM   Final   Special Requests BOTTLES DRAWN AEROBIC ONLY 8CC   Final   Culture  Setup Time     Final   Value: 12/13/2012 21:00     Performed at [facility]   Culture     Final   Value:        BLOOD CULTURE RECEIVED NO GROWTH TO DATE CULTURE WILL BE HELD FOR 5 DAYS BEFORE ISSUING A FINAL NEGATIVE REPORT     Performed at [facility]   Report Status PENDING   Incomplete  URINE CULTURE     Status: None   Collection Time    12/13/12  2:33 PM      Result Value Range Status   Specimen Description URINE, CLEAN CATCH   Final   Special Requests NONE   Final   Culture  Setup Time     Final   Value: 12/13/2012 15:00     Performed at [facility] Count     Final   Value: NO GROWTH     Performed at [facility]   Culture     Final   Value: NO GROWTH     Performed at [facility]   Report Status 12/14/2012 FINAL   Final  CSF  CULTURE     Status: None   Collection Time    12/13/12  9:35 PM      Result Value Range Status   Specimen Description CSF   Final   Special Requests NO 2 3CC   Final   Gram Stain     Final   Value: CYTOSPIN WBC PRESENT,BOTH PMN AND MONONUCLEAR     NO ORGANISMS SEEN     Performed at [facility]     Performed at [facility]   Culture PENDING   Incomplete   Report Status PENDING   Incomplete  GRAM STAIN     Status: None   Collection Time    12/13/12  9:35 PM      Result Value Range Status   Specimen Description CSF   Final   Special Requests NO 2 3CC   Final   Gram Stain     Final   Value: CYTOSPIN SAMPLE     WBC PRESENT,BOTH PMN AND MONONUCLEAR     NO ORGANISMS SEEN   Report Status 12/13/2012 FINAL   Final    Medical History: Past Medical History  Diagnosis Date  . Fibromyalgia   . Diabetes insipidus   . Lupus   . Mitral valve prolapse   . SLE (systemic lupus erythematosus) 12/06/2012   Assessment: 51yof admitted with fever, HA and neck stiffness. ID  has been consulted and discontinued Vancomycin, Cefepime and Ampicillin for bacterial meningitis coverage and consulted pharmacy to start Acyclovir for possible HSV encephalitis while viral panel is pending. Patient is currently afebrile and WBC have trended to wnl. - Wt: 63.5kg - CrCl 80 ml/min  Plan:  1. Acyclovir 630mg  (~10mg /kg) IV q8h 2. Follow-up viral cultures (HSV, Enterovirus) 3. Monitor renal function, cultures, clinical course  Cleon Dew [phone] 12/14/2012,1:30 PM

## 2012-12-14 NOTE — H&P (Addendum)
[facility]     Reason for Consult:? infection    Referring Physician: Dr. Timothy Lasso  Principal Problem:   Fever Active Problems:   Pleuritic chest pain   SLE (systemic lupus erythematosus)   DI (diabetes insipidus)   Anxiety   . gabapentin  300 mg Oral BID  . lactose free nutrition  237 mL Oral TID WC  . levothyroxine  150 mcg Oral QAC breakfast  . lidocaine  1 patch Transdermal Q24H  . ondansetron (ZOFRAN) IV  8 mg Intravenous Once  . pantoprazole  40 mg Oral BID WC  . predniSONE  20 mg Oral Q breakfast  . topiramate  100 mg Oral Daily    Recommendations: Stop antibiotics I will start acyclovir pending HSV in CSF  Assessment: She has a headache, confusion and increased WBCs in CSF concerning for encephalitis.  Possible viral meningitis such as enterovirus.  With the lympocyte and monocyte predominance, I do not suspect bacterial meningitis, no clinical meningeal signs.  Possibly sinusitis.   Antibiotics: Vancom/cefepime/ampicillin  HPI: Stefanie [name] is a 52 y.o. female with lupus and a recent flare in her knees bilateral that progressed to chest pain felt likely pleurisy vs costocondritis.  She continued on steroid, plaquinil and then was felling better until 2 days pta feeling weak, fever, headache.  Headache was throbbing, bilateral and did not resolve with migraine medicine. She also noted some confusion the last two days that is unusual for her.  She does have diabetes insipidus and had more difficulty with sodium control recently as well.  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  No recent travel.   Neck stiffness over right SCM, no mid neck stiffness.     Review of Systems: A comprehensive review of systems was negative.  Past Medical History  Diagnosis Date  . Fibromyalgia   . Diabetes insipidus   . Lupus   . Mitral valve prolapse   . SLE (systemic lupus erythematosus) 12/06/2012    History  Substance Use  Topics  . Smoking status: Never Smoker   . Smokeless tobacco: Not on file  . Alcohol Use: No    History reviewed. No pertinent family history. Allergies  Allergen Reactions  . Venofer [Ferric Oxide] Palpitations    Dizziness, sweating, "felt like I was going to pass out" unknown when episode happened  . Azithromycin Other (See Comments)    "couldnt sleep"    OBJECTIVE: Blood pressure 114/64, pulse 65, temperature 98.6 F (37 C), temperature source Oral, resp. rate 18, height 5\' 7"  (1.702 m), weight 140 lb (63.504 kg), SpO2 98.00%. General: Awake, alert, nad Skin: no rashes Lungs: CTA B Cor: RRR without m/r/g Abdomen: soft, nt, nd, +bs Ext: no edema Neuro: no meningeal signs with negative brud, kernigs.  Microbiology: Recent Results (from the past 240 hour(s))  CULTURE, BLOOD (ROUTINE X 2)     Status: None   Collection Time    12/13/12 11:15 AM      Result Value Range Status   Specimen Description BLOOD RIGHT WRIST   Final   Special Requests BOTTLES DRAWN AEROBIC AND ANAEROBIC 10CC   Final   Culture  Setup Time     Final   Value: 12/13/2012 21:01     Performed at [facility]   Culture     Final   Value:        BLOOD CULTURE RECEIVED NO GROWTH TO DATE CULTURE WILL BE  HELD FOR 5 DAYS BEFORE ISSUING A FINAL NEGATIVE REPORT     Performed at [facility]   Report Status PENDING   Incomplete  CULTURE, BLOOD (ROUTINE X 2)     Status: None   Collection Time    12/13/12  2:21 PM      Result Value Range Status   Specimen Description BLOOD LEFT FOREARM   Final   Special Requests BOTTLES DRAWN AEROBIC ONLY 8CC   Final   Culture  Setup Time     Final   Value: 12/13/2012 21:00     Performed at [facility]   Culture     Final   Value:        BLOOD CULTURE RECEIVED NO GROWTH TO DATE CULTURE WILL BE HELD FOR 5 DAYS BEFORE ISSUING A FINAL NEGATIVE REPORT     Performed at [facility]   Report Status PENDING   Incomplete  URINE CULTURE     Status:  None   Collection Time    12/13/12  2:33 PM      Result Value Range Status   Specimen Description URINE, CLEAN CATCH   Final   Special Requests NONE   Final   Culture  Setup Time     Final   Value: 12/13/2012 15:00     Performed at [facility] Count     Final   Value: NO GROWTH     Performed at [facility]   Culture     Final   Value: NO GROWTH     Performed at [facility]   Report Status 12/14/2012 FINAL   Final  CSF CULTURE     Status: None   Collection Time    12/13/12  9:35 PM      Result Value Range Status   Specimen Description CSF   Final   Special Requests NO 2 3CC   Final   Gram Stain     Final   Value: CYTOSPIN WBC PRESENT,BOTH PMN AND MONONUCLEAR     NO ORGANISMS SEEN     Performed at [facility]     Performed at [facility]   Culture PENDING   Incomplete   Report Status PENDING   Incomplete  GRAM STAIN     Status: None   Collection Time    12/13/12  9:35 PM      Result Value Range Status   Specimen Description CSF   Final   Special Requests NO 2 3CC   Final   Gram Stain     Final   Value: CYTOSPIN SAMPLE     WBC PRESENT,BOTH PMN AND MONONUCLEAR     NO ORGANISMS SEEN   Report Status 12/13/2012 FINAL   Final    Sebasthian Stailey, Molly Maduro, MD [facility] [email or web address] [phone] pager  [phone] cell 12/14/2012, 12:06 PM

## 2012-12-14 NOTE — Progress Notes (Signed)
Subjective: Admitted yesterday c Fever, Slight Leukocytosis, Sig HA while immunosuppressed for her SLE. CCT showed possible Sinusitis and Rocephin given. LP with opening pressure of 22 mmHg and closing pressure of 14 mmHg showed WBC 128-186 in 2 tubes and she was loaded c Abx for possible Bact Meningitis. 30-40% Polys. Gram stain= WBC PRESENT,BOTH PMN AND MONONUCLEAR; NO ORGANISMS SEEN Awaiting PCR Currently Afebrile. Cannot find Flu results. Looking and feeling much better. HA better. GI better. Interactive, eyes open. No N/V    Objective: Vital signs in last 24 hours: Temp:  [98.4 F (36.9 C)-102.6 F (39.2 C)] 98.4 F (36.9 C) (10/08 0541) Pulse Rate:  [72-108] 74 (10/08 0541) Resp:  [10-20] 18 (10/08 0541) BP: (89-132)/(54-76) 96/62 mmHg (10/08 0541) SpO2:  [96 %-100 %] 97 % (10/08 0541) Weight:  [63.504 kg (140 lb)] 63.504 kg (140 lb) (10/07 1020) Weight change:     CBG (last 3)  No results found for this basename: GLUCAP,  in the last 72 hours  Intake/Output from previous day:  Intake/Output Summary (Last 24 hours) at 12/14/12 0734 Last data filed at 12/13/12 2335  Gross per 24 hour  Intake    360 ml  Output      0 ml  Net    360 ml   10/07 0701 - 10/08 0700 In: 360 [P.O.:360] Out: -    Physical Exam General appearance: A and O - Looks better. Eyes: no scleral icterus Throat: Dry Resp: CTA Cardio: Reg GI: soft, non-tender; bowel sounds normal; no masses,  no organomegaly Extremities: no clubbing, cyanosis or edema Tender chest   Lab Results:  Recent Labs  12/13/12 1028  NA 131*  K 5.1  CL 92*  CO2 26  GLUCOSE 85  BUN 15  CREATININE 0.81  CALCIUM 9.4     Recent Labs  12/13/12 1028  AST 35  ALT 24  ALKPHOS 99  BILITOT 0.6  PROT 9.3*  ALBUMIN 4.0     Recent Labs  12/13/12 1028  WBC 12.6*  NEUTROABS 8.5*  HGB 13.9  HCT 40.4  MCV 84.5  PLT 304    Lab Results  Component Value Date   INR 0.9 04/06/2008     Recent  Labs  12/13/12 1400  TROPONINI <0.30    No results found for this basename: TSH, T4TOTAL, FREET3, T3FREE, THYROIDAB,  in the last 72 hours  No results found for this basename: VITAMINB12, FOLATE, FERRITIN, TIBC, IRON, RETICCTPCT,  in the last 72 hours  Micro Results: Recent Results (from the past 240 hour(s))  GRAM STAIN     Status: None   Collection Time    12/13/12  9:35 PM      Result Value Range Status   Specimen Description CSF   Final   Special Requests NO 2 3CC   Final   Gram Stain     Final   Value: CYTOSPIN SAMPLE     WBC PRESENT,BOTH PMN AND MONONUCLEAR     NO ORGANISMS SEEN   Report Status 12/13/2012 FINAL   Final     Studies/Results: Dg Chest 2 View  12/13/2012   CLINICAL DATA:  Fever.  Lupus.  EXAM: CHEST  2 VIEW  COMPARISON:  12/06/2012 plain film and CT.  FINDINGS: Midline trachea. Normal heart size and mediastinal contours. No pleural effusion or pneumothorax. Biapical pleural parenchymal scarring. Lower lobe predominant interstitial thickening. Similar. No lobar consolidation.  IMPRESSION: Chronic lower lobe predominant interstitial thickening, without acute superimposed process.   Electronically  Signed   By: Jeronimo Greaves M.D.   On: 12/13/2012 10:56   Ct Head Wo Contrast  12/13/2012   CLINICAL DATA:  Headache. Fever.  EXAM: CT HEAD WITHOUT CONTRAST  TECHNIQUE: Contiguous axial images were obtained from the base of the skull through the vertex without intravenous contrast.  COMPARISON:  02/01/2008  FINDINGS: There is high density opacification of the entire visualized upper left maxillary sinus along with opacification of multiple ethmoid air cells and complete opacification of the left frontal sinus. There is a large air-fluid level in the sphenoid sinus with bony findings favoring underlying chronic sphenoid sinusitis as well.  The brainstem, cerebellum, cerebral peduncles, thalamus, basal ganglia, basilar cisterns, and ventricular system appear within normal limits.  No intracranial hemorrhage, mass lesion, or acute CVA.  IMPRESSION: 1. Acute sphenoid sinusitis with complete opacification of multiple ethmoid air cells, of the left frontal sinus, and of the visualize portion of the left maxillary sinus. Left maxillary sinus opacification is high in density which can sometimes be seen with fungal sinusitis. 2. Otherwise negative exam.   Electronically Signed   By: Herbie Baltimore M.D.   On: 12/13/2012 21:26     Medications: Scheduled: . ampicillin (OMNIPEN) IV  2 g Intravenous Q4H  . ceFEPime (MAXIPIME) IV  2 g Intravenous Q8H  . gabapentin  300 mg Oral BID  . hydroxychloroquine  200 mg Oral BID WC  . levothyroxine  150 mcg Oral QAC breakfast  . lidocaine  1 patch Transdermal Q24H  . ondansetron (ZOFRAN) IV  8 mg Intravenous Once  . pantoprazole  40 mg Oral BID WC  . predniSONE  20 mg Oral Q breakfast  . topiramate  100 mg Oral Daily  . vancomycin  1,000 mg Intravenous Q12H   Continuous: . sodium chloride 1,000 mL (12/14/12 0456)     Assessment/Plan: Principal Problem:   Fever Active Problems:   Pleuritic chest pain   SLE (systemic lupus erythematosus)   DI (diabetes insipidus)   Anxiety  Fever, HA, Leukocytosis, Sphenoid Sinusitis, and LP c Elevated WBC/Polys in relatively immunosuppressed SLE pt on Plaquenil and Pred/Depomedrol/Solumedrol.  Currently on Triple Abx Rx for possible Bact Meningitis but Viral meningitis makes more sense.  S/p recent inpt admission for Pleurisy/Chest Pain/Costochondritis c (-) CTPA/(-) ECHO/(-) EKG for Pericarditis and Decent labs.  Consult ID Continue Sxatic control. Supportive care at this time. IVF Continue Home meds. She already looks much better!  Hyponatremia in pt c DI.  Just Rx c IVF.  Resume ddAVP soon. Leukocytosis - follow. SLE - Continue Steroids to avoid Adrenal Insuff.  D/C Plaquenil for now. Migraines. Hypothyroidism.  ID -  Anti-infectives   Start     Dose/Rate Route Frequency Ordered Stop    12/14/12 0800  hydroxychloroquine (PLAQUENIL) tablet 200 mg     200 mg Oral 2 times daily with meals 12/13/12 1849     12/14/12 0700  ampicillin (OMNIPEN) 2 g in sodium chloride 0.9 % 50 mL IVPB     2 g 150 mL/hr over 20 Minutes Intravenous 6 times per day 12/14/12 0453     12/14/12 0600  vancomycin (VANCOCIN) IVPB 1000 mg/200 mL premix     1,000 mg 200 mL/hr over 60 Minutes Intravenous Every 12 hours 12/14/12 0453     12/14/12 0500  ceFEPIme (MAXIPIME) 2 g in dextrose 5 % 50 mL IVPB     2 g 100 mL/hr over 30 Minutes Intravenous 3 times per day 12/14/12 0453  12/13/12 2359  cefTRIAXone (ROCEPHIN) 1 g in dextrose 5 % 50 mL IVPB  Status:  Discontinued     1 g 100 mL/hr over 30 Minutes Intravenous Every 12 hours 12/13/12 2304 12/14/12 0450     DVT Prophylaxis but Lovenox on Hold c LP.  SCDs.    LOS: 1 day   Edynn Gillock M 12/14/2012, 7:34 AM

## 2012-12-14 NOTE — Progress Notes (Signed)
ANTIBIOTIC CONSULT NOTE - INITIAL  Pharmacy Consult for vancomycin, cefepime and ampicillin Indication: bacterial meningitis.   Allergies  Allergen Reactions  . Venofer [Ferric Oxide] Palpitations    Dizziness, sweating, "felt like I was going to pass out" unknown when episode happened  . Azithromycin Other (See Comments)    "couldnt sleep"    Patient Measurements: Height: 5\' 7"  (170.2 cm) Weight: 140 lb (63.504 kg) IBW/kg (Calculated) : 61.6 Adjusted Body Weight:   Vital Signs: Temp: 98.8 F (37.1 C) (10/07 2042) Temp src: Oral (10/07 2042) BP: 119/76 mmHg (10/07 2042) Pulse Rate: 76 (10/07 2042) Intake/Output from previous day: 10/07 0701 - 10/08 0700 In: 360 [P.O.:360] Out: -  Intake/Output from this shift: Total I/O In: 360 [P.O.:360] Out: -   Labs:  Recent Labs  12/13/12 1028  WBC 12.6*  HGB 13.9  PLT 304  CREATININE 0.81   Estimated Creatinine Clearance: 79 ml/min (by C-G formula based on Cr of 0.81). No results found for this basename: VANCOTROUGH, VANCOPEAK, VANCORANDOM, GENTTROUGH, GENTPEAK, GENTRANDOM, TOBRATROUGH, TOBRAPEAK, TOBRARND, AMIKACINPEAK, AMIKACINTROU, AMIKACIN,  in the last 72 hours   Microbiology: Recent Results (from the past 720 hour(s))  GRAM STAIN     Status: None   Collection Time    12/13/12  9:35 PM      Result Value Range Status   Specimen Description CSF   Final   Special Requests NO 2 3CC   Final   Gram Stain     Final   Value: CYTOSPIN SAMPLE     WBC PRESENT,BOTH PMN AND MONONUCLEAR     NO ORGANISMS SEEN   Report Status 12/13/2012 FINAL   Final    Medical History: Past Medical History  Diagnosis Date  . Fibromyalgia   . Diabetes insipidus   . Lupus   . Mitral valve prolapse   . SLE (systemic lupus erythematosus) 12/06/2012    Medications:  Prescriptions prior to admission  Medication Sig Dispense Refill  . Cholecalciferol (VITAMIN D PO) Take 1 capsule by mouth daily.      [name] desmopressin (DDAVP RHINAL TUBE)  0.01 % SOLN Place 1 spray into the nose at bedtime.      . FeFum-FePoly-FA-B Cmp-C-Biot (INTEGRA PLUS PO) Take 1 tablet by mouth daily.      [name] gabapentin (NEURONTIN) 300 MG capsule Take 1 capsule (300 mg total) by mouth 2 (two) times daily.  40 capsule  0  . hydroxychloroquine (PLAQUENIL) 200 MG tablet Take 200 mg by mouth 2 (two) times daily.      [name] ibuprofen (ADVIL,MOTRIN) 200 MG tablet Take 400 mg by mouth every 6 (six) hours as needed for pain.      [name] levothyroxine (SYNTHROID, LEVOTHROID) 150 MCG tablet Take 150 mcg by mouth daily before breakfast.      . oxyCODONE (OXY IR/ROXICODONE) 5 MG immediate release tablet Take 1 tablet (5 mg total) by mouth every 4 (four) hours as needed.  30 tablet  0  . pantoprazole (PROTONIX) 40 MG tablet Take 1 tablet (40 mg total) by mouth 2 (two) times daily with a meal.  60 tablet  0  . predniSONE (DELTASONE) 20 MG tablet Take 1 tablet (20 mg total) by mouth daily with breakfast.      . rizatriptan (MAXALT) 10 MG tablet Take 10 mg by mouth daily as needed for migraine. May repeat in 2 hours if needed      . topiramate (TOPAMAX) 100 MG tablet Take 100 mg by mouth daily.      [name]  acetaminophen (TYLENOL) 500 MG tablet Take 1,000 mg by mouth every 6 (six) hours as needed for pain.      [name] albuterol (PROVENTIL HFA;VENTOLIN HFA) 108 (90 BASE) MCG/ACT inhaler Inhale 1 puff into the lungs every 6 (six) hours as needed for wheezing or shortness of breath.      . lidocaine (LIDODERM) 5 % Place 1 patch onto the skin daily. Remove & Discard patch within 12 hours or as directed by MD  30 patch  0  . LORazepam (ATIVAN) 0.5 MG tablet Take 1 tablet (0.5 mg total) by mouth every 8 (eight) hours as needed for anxiety.  30 tablet  0  . nitroGLYCERIN (NITROSTAT) 0.4 MG SL tablet Place 0.4 mg under the tongue every 5 (five) minutes as needed for chest pain.       Assessment: 52 yo female with wbc and decreased glucose in csf via lumbar punture. Full results were pending at epic  downtime. Vanc, cefepime and ampicillin for empiric coverage until micro results.   Goal of Therapy:  Vancomycin trough level 15-20 mcg/ml  Plan: vancomycin 1gm q12h (trough before 4th dose)   Cefepime 2 gm q8h   Ampicillin 2 gm q4h   Tharon Aquas 12/14/2012,2:58 AM

## 2012-12-14 NOTE — Progress Notes (Signed)
INITIAL NUTRITION ASSESSMENT  DOCUMENTATION CODES Per approved criteria  -Not Applicable   INTERVENTION: 1. Boost oral nutrition supplement  NUTRITION DIAGNOSIS: Inadequate oral intake related to poor appetite as evidenced by N/V.   Goal: PO intake intake to meet >/=90% estimated nutrition needs  Monitor:  PO intake, weight trends   Reason for Assessment: Malnutrition Screening Tool  52 y.o. female  Admitting Dx: Fever  ASSESSMENT: Pt admitted with fever, hx of immunosuppression with hx of systemic lupus erythematosus. With recent hospitalization for chest/body pain and pain has not completely resolved per notes. S/p LP today.  Pt states she has not been eating well for about 4 days, N/V. States she has lost about 4 lbs, likely fluid related. Ready to try a little food.   Pt agreeable to oral nutrition supplements, wants to try Boost. States that in the past she has had an adverse reaction to IV iron. When Boost was ordered at previous admissions, she was told not to drink it because of the iron content. Pt states she has taken oral iron supplement with out any reactions. She did drink some of a Boost at last admission with out any side effects.   No signs of muscle or fat wasting noted.   Height: Ht Readings from Last 1 Encounters:  12/13/12 5\' 7"  (1.702 m)    Weight: Wt Readings from Last 1 Encounters:  12/13/12 140 lb (63.504 kg)    Ideal Body Weight: 135 lbs   % Ideal Body Weight: 103%  Wt Readings from Last 10 Encounters:  12/13/12 140 lb (63.504 kg)  12/06/12 138 lb 1.6 oz (62.642 kg)  01/08/10 150 lb 6.1 oz (68.212 kg)    Usual Body Weight: 138-140 lbs  % Usual Body Weight: 100%  BMI:  Body mass index is 21.92 kg/(m^2). WNL   Estimated Nutritional Needs: Kcal: 1450-1650 Protein: 65-75 gm  Fluid: 1.5-1.7 L  Skin: intact   Diet Order: Full Liquid  EDUCATION NEEDS: -No education needs identified at this time   Intake/Output Summary (Last 24  hours) at 12/14/12 1103 Last data filed at 12/14/12 1000  Gross per 24 hour  Intake    840 ml  Output    500 ml  Net    340 ml    Last BM: PTA   Labs:   Recent Labs Lab 12/08/12 0655 12/13/12 1028 12/14/12 0715  NA 139 131* 138  K 4.2 5.1 4.1  CL 107 92* 105  CO2 22 26 24   BUN 12 15 13   CREATININE 0.62 0.81 0.79  CALCIUM 9.5 9.4 8.8  GLUCOSE 120* 85 94    CBG (last 3)  No results found for this basename: GLUCAP,  in the last 72 hours  Scheduled Meds: . ampicillin (OMNIPEN) IV  2 g Intravenous Q4H  . ceFEPime (MAXIPIME) IV  2 g Intravenous Q8H  . gabapentin  300 mg Oral BID  . levothyroxine  150 mcg Oral QAC breakfast  . lidocaine  1 patch Transdermal Q24H  . ondansetron (ZOFRAN) IV  8 mg Intravenous Once  . pantoprazole  40 mg Oral BID WC  . predniSONE  20 mg Oral Q breakfast  . topiramate  100 mg Oral Daily  . vancomycin  1,000 mg Intravenous Q12H    Continuous Infusions: . sodium chloride 1,000 mL (12/14/12 0456)    Past Medical History  Diagnosis Date  . Fibromyalgia   . Diabetes insipidus   . Lupus   . Mitral valve prolapse   .  SLE (systemic lupus erythematosus) 12/06/2012    History reviewed. No pertinent past surgical history.  Isabell Jarvis RD, LDN Pager [phone] After Hours pager [phone]

## 2012-12-14 NOTE — H&P (Signed)
Stefanie [name] is an 52 y.o. female.  Plan: Vancomycin +ampicillin + cefepime  as empiric treatment for bacterial meningitis. Add dexamethasone. Check for LP culture. Consider PCR on CSF. Ibuprufen as needed for fever spike. Continue monitoring temperature and vital signs.   Assessment: Fever, neck stiffness, headache, lethargy-- Encephalitis is a possible cause as she was slightly disoriented with memory loss at the time of admission. Differential also includes meningitis (bacterial vs viral) due to headache and neck stiffness.  Also R/O lupus flare.Disorientation could also be from altered Na levels, with a past medical history of diabetes insipidus. Headache could have resulted from sinusitis as was indicated by CT.  HPI  52 year old female was doing well until a day ago when she started experiencing a throbbing headache, bilateral in the frontal region, not associated with tinnitis, hearing loss or blurry vision. She also had 5 episodes of  vomiting, a high grade persistent fever, neck stiffness and lethargy. She denies noting any rashes over her body. She says she was quite disoriented at the time and had memory loss. The headache has decreased in intensity but remains frontal and pressure like. She feels fully oriented and has had no episodes of vomiting or nausea since the admission. She was admitted a week ago with pleuritic chest pain but has no current complaints.    PMH: SLE. DI, MVP, hypothyroidism, anxiety Social history: no history of smoking or drug abuse Family history: Father: stroke, myocardial infarction, diabetes mellitus Mother: hypothyroid Allergies: Venofer, azithromycin   Review of Systems  Constitutional: Positive for fever, chills and malaise/fatigue.  HENT: Negative for hearing loss and tinnitus.   Eyes: Negative for blurred vision and double vision.  Respiratory: Negative for shortness of breath.   Cardiovascular: Negative for chest pain.  Gastrointestinal:  Negative for nausea, vomiting and abdominal pain.  Musculoskeletal: Positive for neck pain.  Skin: Negative for rash.  Neurological: Positive for headaches. Negative for dizziness, tingling, sensory change and focal weakness.  Psychiatric/Behavioral: Negative for memory loss.    Blood pressure 96/62, pulse 74, temperature 98.4 F (36.9 C), temperature source Oral, resp. rate 18, height 5\' 7"  (1.702 m), weight 63.504 kg (140 lb), SpO2 97.00%. Physical Exam  Constitutional: She appears well-developed and well-nourished.  Cardiovascular: Normal rate, regular rhythm, normal heart sounds and intact distal pulses.   Respiratory: Effort normal and breath sounds normal. No respiratory distress.  GI: Soft. Bowel sounds are normal. She exhibits no distension and no mass. There is no tenderness. There is no rebound and no guarding.  Musculoskeletal: Normal range of motion. She exhibits no tenderness.  Neurological: She is alert. No sensory deficit.  Kernig's Sign and Brudzinski's Sign -ve  Skin: No erythema.  Psychiatric: She has a normal mood and affect. Her behavior is normal. Judgment and thought content normal.      Stefanie [name] 12/14/2012, 9:38 AM

## 2012-12-15 DIAGNOSIS — A879 Viral meningitis, unspecified: Principal | ICD-10-CM

## 2012-12-15 DIAGNOSIS — A89 Unspecified viral infection of central nervous system: Secondary | ICD-10-CM

## 2012-12-15 LAB — BASIC METABOLIC PANEL
CO2: 23 mEq/L (ref 19–32)
Calcium: 8.9 mg/dL (ref 8.4–10.5)
Creatinine, Ser: 0.74 mg/dL (ref 0.50–1.10)
GFR calc Af Amer: >90 mL/min (ref >90)
GFR calc non Af Amer: >90 mL/min (ref >90)
Glucose, Bld: 91 mg/dL (ref 70–99)
Sodium: 140 mEq/L (ref 135–145)

## 2012-12-15 LAB — CBC
MCH: 27.6 pg (ref 26.0–34.0)
MCHC: 32.5 g/dL (ref 30.0–36.0)
MCV: 84.9 fL (ref 78.0–100.0)
Platelets: 311 10*3/uL (ref 150–400)
RBC: 4.45 MIL/uL (ref 3.87–5.11)
RDW: 13.8 % (ref 11.5–15.5)

## 2012-12-15 MED ORDER — FLUTICASONE PROPIONATE 50 MCG/ACT NA SUSP
2.0000 | Freq: Two times a day (BID) | NASAL | Status: DC
  Administered 2012-12-15 – 2012-12-16 (×3): 2 via NASAL
  Filled 2012-12-15: qty 16

## 2012-12-15 MED ORDER — SODIUM CHLORIDE 0.9 % IV SOLN
1000.0000 mL | INTRAVENOUS | Status: DC
  Administered 2012-12-15: 08:00:00 1000 mL via INTRAVENOUS

## 2012-12-15 MED ORDER — OXYCODONE HCL 5 MG PO TABS: 5.0000 mg | ORAL_TABLET | Freq: Four times a day (QID) | ORAL | Status: DC | PRN

## 2012-12-15 NOTE — Progress Notes (Signed)
[facility]  Date of Admission:  12/13/2012  Antibiotics: Antibiotics Given (last 72 hours)   Date/Time Action Medication Dose Rate   12/14/12 0005 Given   cefTRIAXone (ROCEPHIN) 1 g in dextrose 5 % 50 mL IVPB 1 g 100 mL/hr   12/14/12 0456 Given   ceFEPIme (MAXIPIME) 2 g in dextrose 5 % 50 mL IVPB 2 g 100 mL/hr   12/14/12 0700 Given   ampicillin (OMNIPEN) 2 g in sodium chloride 0.9 % 50 mL IVPB 2 g 150 mL/hr   12/14/12 1512 Given   acyclovir (ZOVIRAX) 630 mg in dextrose 5 % 100 mL IVPB 630 mg 112.6 mL/hr   12/14/12 2148 Given   acyclovir (ZOVIRAX) 630 mg in dextrose 5 % 100 mL IVPB 630 mg 112.6 mL/hr   12/15/12 0636 Given   acyclovir (ZOVIRAX) 630 mg in dextrose 5 % 100 mL IVPB 630 mg 112.6 mL/hr   12/15/12 1421 Given   acyclovir (ZOVIRAX) 630 mg in dextrose 5 % 100 mL IVPB 630 mg 112.6 mL/hr      Subjective: Feels better, though weak  Objective: Temp:  [98 F (36.7 C)-99.7 F (37.6 C)] 99 F (37.2 C) (10/09 1431) Pulse Rate:  [74-124] 124 (10/09 1431) Resp:  [18] 18 (10/09 1431) BP: (107-120)/(72-81) 119/80 mmHg (10/09 1431) SpO2:  [99 %-100 %] 99 % (10/09 1431) Weight:  [141 lb 8.6 oz (64.2 kg)] 141 lb 8.6 oz (64.2 kg) (10/08 2047)  General: Awake, alert Skin: no rashes Lungs: CTA B Cor: RRR Abdomen: soft, nt, nd Ext: no edema  Lab Results Lab Results  Component Value Date   WBC 8.6 12/15/2012   HGB 12.3 12/15/2012   HCT 37.8 12/15/2012   MCV 84.9 12/15/2012   PLT 311 12/15/2012    Lab Results  Component Value Date   CREATININE 0.74 12/15/2012   BUN 9 12/15/2012   NA 140 12/15/2012   K 3.8 12/15/2012   CL 105 12/15/2012   CO2 23 12/15/2012    Lab Results  Component Value Date   ALT 24 12/13/2012   AST 35 12/13/2012   ALKPHOS 99 12/13/2012   BILITOT 0.6 12/13/2012      Microbiology: Recent Results (from the past 240 hour(s))  CULTURE, BLOOD (ROUTINE X 2)     Status: None   Collection Time    12/13/12 11:15 AM      Result  Value Range Status   Specimen Description BLOOD RIGHT WRIST   Final   Special Requests BOTTLES DRAWN AEROBIC AND ANAEROBIC 10CC   Final   Culture  Setup Time     Final   Value: 12/13/2012 21:01     Performed at [facility]   Culture     Final   Value:        BLOOD CULTURE RECEIVED NO GROWTH TO DATE CULTURE WILL BE HELD FOR 5 DAYS BEFORE ISSUING A FINAL NEGATIVE REPORT     Performed at [facility]   Report Status PENDING   Incomplete  CULTURE, BLOOD (ROUTINE X 2)     Status: None   Collection Time    12/13/12  2:21 PM      Result Value Range Status   Specimen Description BLOOD LEFT FOREARM   Final   Special Requests BOTTLES DRAWN AEROBIC ONLY 8CC   Final   Culture  Setup Time     Final   Value: 12/13/2012 21:00     Performed at [facility]   Culture     Final   Value:        BLOOD CULTURE RECEIVED NO GROWTH TO DATE CULTURE WILL BE HELD FOR 5 DAYS BEFORE ISSUING A FINAL NEGATIVE REPORT     Performed at [facility]   Report Status PENDING   Incomplete  URINE CULTURE     Status: None   Collection Time    12/13/12  2:33 PM      Result Value Range Status   Specimen Description URINE, CLEAN CATCH   Final   Special Requests NONE   Final   Culture  Setup Time     Final   Value: 12/13/2012 15:00     Performed at [facility]     Final   Value: NO GROWTH     Performed at [facility]   Culture     Final   Value: NO GROWTH     Performed at [facility]   Report Status 12/14/2012 FINAL   Final  CSF CULTURE     Status: None   Collection Time    12/13/12  9:35 PM      Result Value Range Status   Specimen Description CSF   Final   Special Requests NO 2 3CC   Final   Gram Stain     Final   Value: CYTOSPIN WBC PRESENT,BOTH PMN AND MONONUCLEAR     NO ORGANISMS SEEN     Performed at [facility]     Performed at [facility]   Culture     Final   Value: NO GROWTH 1 DAY     Performed at [facility]   Report Status PENDING   Incomplete  GRAM STAIN     Status: None   Collection Time    12/13/12  9:35 PM      Result Value Range Status   Specimen Description CSF   Final   Special Requests NO 2 3CC   Final   Gram Stain     Final   Value: CYTOSPIN SAMPLE     WBC PRESENT,BOTH PMN AND MONONUCLEAR     NO ORGANISMS SEEN   Report Status 12/13/2012 FINAL   Final    Studies/Results: Ct Head Wo Contrast  12/13/2012   CLINICAL DATA:  Headache. Fever.  EXAM: CT HEAD WITHOUT CONTRAST  TECHNIQUE: Contiguous axial images were obtained from the base of the skull through the vertex without intravenous contrast.  COMPARISON:  02/01/2008  FINDINGS: There is high density opacification of the entire visualized upper left maxillary sinus along with opacification of multiple ethmoid air cells and complete opacification of the left frontal sinus. There is a large air-fluid level in the sphenoid sinus with bony findings favoring underlying chronic sphenoid sinusitis as well.  The brainstem, cerebellum, cerebral peduncles, thalamus, basal ganglia, basilar cisterns, and ventricular system appear within normal limits. No intracranial hemorrhage, mass lesion, or acute CVA.  IMPRESSION: 1. Acute sphenoid sinusitis with complete opacification of multiple ethmoid air cells, of the left frontal sinus, and of the visualize portion of the left maxillary sinus. Left maxillary sinus opacification is high in density which can sometimes be seen with fungal sinusitis. 2. Otherwise negative exam.   Electronically Signed   By: Herbie Baltimore M.D.   On: 12/13/2012 21:26   Dg Fluoro Guide Lumbar Puncture  12/14/2012   *RADIOLOGY REPORT*  Clinical Data:Headache, immunosuppression, fever, evaluate for meningitis/encephalitis,  initial encounter.  FLUORSCOPIC GUIDED LUMBAR PUNCTURE WITH OPENING AND CLOSING PRESSURES  Comparisons:  Head CT - earlier same day  Intravenous Medications: None  Fluoroscopy Time: 0 minutes, 7 seconds   Complications: None immediate  Technique:  Informed written consent was obtained from the patient after a discussion of the risks, benefits and alternatives to treatment. Questions regarding the procedure were encouraged and answered.  A timeout was performed prior to the initiation of the procedure.  The patient was placed prone, slightly LPO on the fluoroscopy table.  The right at L2 - L3 transverse foramina was marked fluoroscopically.  The skin overlying the operative site was prepped and draped in the usual sterile fashion.  Local anesthesia was provided with 1% lidocaine.  Under intermittent fluoroscopic guidance, a 22 gauge spinal needle was advanced into the spinal canal.  Appropriate positioning was confirmed with the efflux of clear CSF.  Opening pressure was obtained.  Approximately 12 ml of cerebrospinal fluid was collected into four separate containers.  Closing pressure was obtained. Samples were sent to the Laboratory as requested per the clinical team.  The needle was removed and hemostasis was achieved with manual compression.  A dressing was placed.  The patient tolerated the above procedure well without immediate postprocedural complication.  Findings:  Successful fluoroscopic lumbar puncture yielding approximately 12 ml of clear cerebrospinal fluid  Opening pressure - 22 mmHg  Closing pressure - 16 mmHg.  Impression:  Technically successful fluoroscopic guided lumbar puncture.   Original Report Authenticated By: Tacey Ruiz, MD    Assessment/Plan: 1) viral encephalitis vs viral meningitis - symptomatic treatment.  Ruling out HSV and results should be available by tomorrow and will stop acyclovir if negative.   -ok from an ID standpoint for d/c -no indication for droplet isolation (flu was not done but with + CSF, not likely flu)  Staci Righter, MD [facility] [email or web address] [phone] pager   [phone] cell 12/15/2012, 2:43  PM

## 2012-12-15 NOTE — Progress Notes (Signed)
Subjective: Afebrile > 24 hrs. HAs present but improved.  Frontal and associated c mild Photophobia.  Overall improving. No N/V - Eating better. Husband c URI Chest improving    Objective: Vital signs in last 24 hours: Temp:  [98.1 F (36.7 C)-99.7 F (37.6 C)] 99 F (37.2 C) (10/09 0500) Pulse Rate:  [65-80] 80 (10/09 0500) Resp:  [18-20] 18 (10/09 0500) BP: (100-120)/(63-81) 108/72 mmHg (10/09 0500) SpO2:  [97 %-100 %] 99 % (10/09 0500) Weight:  [64.2 kg (141 lb 8.6 oz)] 64.2 kg (141 lb 8.6 oz) (10/08 2047) Weight change: 0.697 kg (1 lb 8.6 oz)    CBG (last 3)  No results found for this basename: GLUCAP,  in the last 72 hours  Intake/Output from previous day:  Intake/Output Summary (Last 24 hours) at 12/15/12 0716 Last data filed at 12/15/12 0653  Gross per 24 hour  Intake 1896.25 ml  Output   1250 ml  Net 646.25 ml   10/08 0701 - 10/09 0700 In: 1896.3 [P.O.:720; I.V.:1176.3] Out: 1250 [Urine:1250]   Physical Exam General appearance: A and O - Looks better. Throat: Moister Resp: CTA Cardio: Reg GI: soft, non-tender; bowel sounds normal; no masses,  no organomegaly Extremities: no clubbing, cyanosis or edema Tender chest but much less.  Able to move arms over head no ROM issues.   Lab Results:  Recent Labs  12/14/12 0715 12/15/12 0526  NA 138 140  K 4.1 3.8  CL 105 105  CO2 24 23  GLUCOSE 94 91  BUN 13 9  CREATININE 0.79 0.74  CALCIUM 8.8 8.9     Recent Labs  12/13/12 1028  AST 35  ALT 24  ALKPHOS 99  BILITOT 0.6  PROT 9.3*  ALBUMIN 4.0     Recent Labs  12/13/12 1028 12/14/12 0715 12/15/12 0526  WBC 12.6* 7.3 8.6  NEUTROABS 8.5*  --   --   HGB 13.9 11.8* 12.3  HCT 40.4 36.7 37.8  MCV 84.5 85.7 84.9  PLT 304 298 311    Lab Results  Component Value Date   INR 0.9 04/06/2008     Recent Labs  12/13/12 1400  TROPONINI <0.30    No results found for this basename: TSH, T4TOTAL, FREET3, T3FREE, THYROIDAB,  in the last 72  hours  No results found for this basename: VITAMINB12, FOLATE, FERRITIN, TIBC, IRON, RETICCTPCT,  in the last 72 hours  Micro Results: Recent Results (from the past 240 hour(s))  CULTURE, BLOOD (ROUTINE X 2)     Status: None   Collection Time    12/13/12 11:15 AM      Result Value Range Status   Specimen Description BLOOD RIGHT WRIST   Final   Special Requests BOTTLES DRAWN AEROBIC AND ANAEROBIC 10CC   Final   Culture  Setup Time     Final   Value: 12/13/2012 21:01     Performed at [facility]   Culture     Final   Value:        BLOOD CULTURE RECEIVED NO GROWTH TO DATE CULTURE WILL BE HELD FOR 5 DAYS BEFORE ISSUING A FINAL NEGATIVE REPORT     Performed at [facility]   Report Status PENDING   Incomplete  CULTURE, BLOOD (ROUTINE X 2)     Status: None   Collection Time    12/13/12  2:21 PM      Result Value Range Status   Specimen Description BLOOD LEFT FOREARM   Final  Special Requests BOTTLES DRAWN AEROBIC ONLY 8CC   Final   Culture  Setup Time     Final   Value: 12/13/2012 21:00     Performed at [facility]   Culture     Final   Value:        BLOOD CULTURE RECEIVED NO GROWTH TO DATE CULTURE WILL BE HELD FOR 5 DAYS BEFORE ISSUING A FINAL NEGATIVE REPORT     Performed at [facility]   Report Status PENDING   Incomplete  URINE CULTURE     Status: None   Collection Time    12/13/12  2:33 PM      Result Value Range Status   Specimen Description URINE, CLEAN CATCH   Final   Special Requests NONE   Final   Culture  Setup Time     Final   Value: 12/13/2012 15:00     Performed at [facility] Count     Final   Value: NO GROWTH     Performed at [facility]   Culture     Final   Value: NO GROWTH     Performed at [facility]   Report Status 12/14/2012 FINAL   Final  CSF CULTURE     Status: None   Collection Time    12/13/12  9:35 PM      Result Value Range Status   Specimen Description CSF   Final    Special Requests NO 2 3CC   Final   Gram Stain     Final   Value: CYTOSPIN WBC PRESENT,BOTH PMN AND MONONUCLEAR     NO ORGANISMS SEEN     Performed at [facility]     Performed at [facility]   Culture PENDING   Incomplete   Report Status PENDING   Incomplete  GRAM STAIN     Status: None   Collection Time    12/13/12  9:35 PM      Result Value Range Status   Specimen Description CSF   Final   Special Requests NO 2 3CC   Final   Gram Stain     Final   Value: CYTOSPIN SAMPLE     WBC PRESENT,BOTH PMN AND MONONUCLEAR     NO ORGANISMS SEEN   Report Status 12/13/2012 FINAL   Final     Studies/Results: Dg Chest 2 View  12/13/2012   CLINICAL DATA:  Fever.  Lupus.  EXAM: CHEST  2 VIEW  COMPARISON:  12/06/2012 plain film and CT.  FINDINGS: Midline trachea. Normal heart size and mediastinal contours. No pleural effusion or pneumothorax. Biapical pleural parenchymal scarring. Lower lobe predominant interstitial thickening. Similar. No lobar consolidation.  IMPRESSION: Chronic lower lobe predominant interstitial thickening, without acute superimposed process.   Electronically Signed   By: Jeronimo Greaves [name].D.   On: 12/13/2012 10:56   Ct Head Wo Contrast  12/13/2012   CLINICAL DATA:  Headache. Fever.  EXAM: CT HEAD WITHOUT CONTRAST  TECHNIQUE: Contiguous axial images were obtained from the base of the skull through the vertex without intravenous contrast.  COMPARISON:  02/01/2008  FINDINGS: There is high density opacification of the entire visualized upper left maxillary sinus along with opacification of multiple ethmoid air cells and complete opacification of the left frontal sinus. There is a large air-fluid level in the sphenoid sinus with bony findings favoring underlying chronic sphenoid sinusitis as well.  The brainstem, cerebellum, cerebral peduncles, thalamus, basal  ganglia, basilar cisterns, and ventricular system appear within normal limits. No intracranial hemorrhage, mass  lesion, or acute CVA.  IMPRESSION: 1. Acute sphenoid sinusitis with complete opacification of multiple ethmoid air cells, of the left frontal sinus, and of the visualize portion of the left maxillary sinus. Left maxillary sinus opacification is high in density which can sometimes be seen with fungal sinusitis. 2. Otherwise negative exam.   Electronically Signed   By: Herbie Baltimore [name].D.   On: 12/13/2012 21:26   Dg Fluoro Guide Lumbar Puncture  12/14/2012   *RADIOLOGY REPORT*  Clinical Data:Headache, immunosuppression, fever, evaluate for meningitis/encephalitis, initial encounter.  FLUORSCOPIC GUIDED LUMBAR PUNCTURE WITH OPENING AND CLOSING PRESSURES  Comparisons:  Head CT - earlier same day  Intravenous Medications: None  Fluoroscopy Time: 0 minutes, 7 seconds  Complications: None immediate  Technique:  Informed written consent was obtained from the patient after a discussion of the risks, benefits and alternatives to treatment. Questions regarding the procedure were encouraged and answered.  A timeout was performed prior to the initiation of the procedure.  The patient was placed prone, slightly LPO on the fluoroscopy table.  The right at L2 - L3 transverse foramina was marked fluoroscopically.  The skin overlying the operative site was prepped and draped in the usual sterile fashion.  Local anesthesia was provided with 1% lidocaine.  Under intermittent fluoroscopic guidance, a 22 gauge spinal needle was advanced into the spinal canal.  Appropriate positioning was confirmed with the efflux of clear CSF.  Opening pressure was obtained.  Approximately 12 ml of cerebrospinal fluid was collected into four separate containers.  Closing pressure was obtained. Samples were sent to the Laboratory as requested per the clinical team.  The needle was removed and hemostasis was achieved with manual compression.  A dressing was placed.  The patient tolerated the above procedure well without immediate postprocedural  complication.  Findings:  Successful fluoroscopic lumbar puncture yielding approximately 12 ml of clear cerebrospinal fluid  Opening pressure - 22 mmHg  Closing pressure - 16 mmHg.  Impression:  Technically successful fluoroscopic guided lumbar puncture.   Original Report Authenticated By: Tacey Ruiz, MD     Medications: Scheduled: . acyclovir  630 mg Intravenous Q8H  . gabapentin  300 mg Oral BID  . lactose free nutrition  237 mL Oral TID WC  . levothyroxine  150 mcg Oral QAC breakfast  . lidocaine  1 patch Transdermal Q24H  . ondansetron (ZOFRAN) IV  8 mg Intravenous Once  . pantoprazole  40 mg Oral BID WC  . predniSONE  20 mg Oral Q breakfast   Continuous: . sodium chloride       Assessment/Plan: Principal Problem:   Fever Active Problems:   Pleuritic chest pain   SLE (systemic lupus erythematosus)   DI (diabetes insipidus)   Anxiety  Mild Viral Meningitis = Fever/HA/Leukocytosis, and LP c Elevated WBC/Polys in relatively immunosuppressed SLE pt on Plaquenil and Pred/Depomedrol/Solumedrol.  Improving c Sxatic and supportive care.  On Acyclovir until CSF PCR comes back.  Off Triple Abx. Appreciate ID Consult.  Hopefully everything will be back by tomorrow and we can d/c her home.  Clinical course c/w Viral illness in immunosuppressed individual leading to mild viral meningitis.  Stay c Isolation until PCRs (-).[place] to shower and move @ more.  Sphenoid Sinusitis - ? Need for Abx? Will treat c nasal Flonase.  Pleurisy/Chest Pain/Costochondritis c (-) CTPA/(-) ECHO/(-) EKG for Pericarditis and Decent labs.   Hyponatremia in  pt c DI on admit was already better by yesterday.  Just Rx c IVF.  Resume ddAVP soon. Leukocytosis - follow. SLE - Continue Steroids to avoid Adrenal Insuff.  D/C Plaquenil for now. BP fine and we can avoid IV steroids. Migraines - made worse by her illness - some better. Hypothyroidism on Rx.  ID -  Anti-infectives   Start     Dose/Rate Route Frequency  Ordered Stop   12/14/12 1400  acyclovir (ZOVIRAX) 630 mg in dextrose 5 % 100 mL IVPB     630 mg 112.6 mL/hr over 60 Minutes Intravenous 3 times per day 12/14/12 1325     12/14/12 0800  hydroxychloroquine (PLAQUENIL) tablet 200 mg  Status:  Discontinued     200 mg Oral 2 times daily with meals 12/13/12 1849 12/14/12 0738   12/14/12 0700  ampicillin (OMNIPEN) 2 g in sodium chloride 0.9 % 50 mL IVPB  Status:  Discontinued     2 g 150 mL/hr over 20 Minutes Intravenous 6 times per day 12/14/12 0453 12/14/12 1205   12/14/12 0600  vancomycin (VANCOCIN) IVPB 1000 mg/200 mL premix  Status:  Discontinued     1,000 mg 200 mL/hr over 60 Minutes Intravenous Every 12 hours 12/14/12 0453 12/14/12 1205   12/14/12 0500  ceFEPIme (MAXIPIME) 2 g in dextrose 5 % 50 mL IVPB  Status:  Discontinued     2 g 100 mL/hr over 30 Minutes Intravenous 3 times per day 12/14/12 0453 12/14/12 1205   12/13/12 2359  cefTRIAXone (ROCEPHIN) 1 g in dextrose 5 % 50 mL IVPB  Status:  Discontinued     1 g 100 mL/hr over 30 Minutes Intravenous Every 12 hours 12/13/12 2304 12/14/12 0450     DVT Prophylaxis but Lovenox on Hold c LP.  SCDs.    LOS: 2 days   Stefanie [name] 12/15/2012, 7:16 AM

## 2012-12-16 LAB — CBC
HCT: 38.2 % (ref 36.0–46.0)
MCV: 84.9 fL (ref 78.0–100.0)
Platelets: 317 10*3/uL (ref 150–400)
RBC: 4.5 MIL/uL (ref 3.87–5.11)
RDW: 14 % (ref 11.5–15.5)
WBC: 8.5 10*3/uL (ref 4.0–10.5)

## 2012-12-16 LAB — BASIC METABOLIC PANEL
CO2: 22 mEq/L (ref 19–32)
Calcium: 9.1 mg/dL (ref 8.4–10.5)
Creatinine, Ser: 0.67 mg/dL (ref 0.50–1.10)
GFR calc Af Amer: >90 mL/min (ref >90)
GFR calc non Af Amer: >90 mL/min (ref >90)
Potassium: 3.8 mEq/L (ref 3.5–5.1)
Sodium: 137 mEq/L (ref 135–145)

## 2012-12-16 LAB — ENTEROVIRUS PCR: Enterovirus PCR: NOT DETECTED

## 2012-12-16 MED ORDER — GABAPENTIN 300 MG PO CAPS: 300.0000 mg | ORAL_CAPSULE | Freq: Two times a day (BID) | ORAL | Status: DC | PRN

## 2012-12-16 MED ORDER — FLUTICASONE PROPIONATE 50 MCG/ACT NA SUSP: 2.0000 | Freq: Two times a day (BID) | NASAL | Status: DC

## 2012-12-16 NOTE — Progress Notes (Signed)
Subjective: TMax 100.6 Some Tachycardia c moving but recent CTPA and ECHO were fine and she is Asxatic. HAs better No N/V - Eating better. Husband c URI Chest improved She looks so much better - lights on.  Eyes open. Very interactive.   Objective: Vital signs in last 24 hours: Temp:  [98.2 F (36.8 C)-100.6 F (38.1 C)] 98.2 F (36.8 C) (10/10 0617) Pulse Rate:  [77-124] 77 (10/10 0617) Resp:  [18] 18 (10/10 0617) BP: (109-119)/(75-80) 112/76 mmHg (10/10 0617) SpO2:  [98 %-100 %] 98 % (10/10 0617) Weight:  [63.64 kg (140 lb 4.8 oz)] 63.64 kg (140 lb 4.8 oz) (10/09 2028) Weight change: -0.56 kg (-1 lb 3.8 oz) Last BM Date: 12/12/12  CBG (last 3)  No results found for this basename: GLUCAP,  in the last 72 hours  Intake/Output from previous day:  Intake/Output Summary (Last 24 hours) at 12/16/12 0742 Last data filed at 12/15/12 2359  Gross per 24 hour  Intake 1853.33 ml  Output    950 ml  Net 903.33 ml   10/09 0701 - 10/10 0700 In: 1853.3 [P.O.:1200; I.V.:653.3] Out: 950 [Urine:950]   Physical Exam General appearance: A and O - Looks much better. Throat: Moist Resp: CTA Cardio: Reg GI: soft, non-tender; bowel sounds normal; no masses,  no organomegaly Extremities: no clubbing, cyanosis or edema Chest wall and ROM - Better.   Lab Results:  Recent Labs  12/15/12 0526 12/16/12 0610  NA 140 137  K 3.8 3.8  CL 105 103  CO2 23 22  GLUCOSE 91 90  BUN 9 9  CREATININE 0.74 0.67  CALCIUM 8.9 9.1     Recent Labs  12/13/12 1028  AST 35  ALT 24  ALKPHOS 99  BILITOT 0.6  PROT 9.3*  ALBUMIN 4.0     Recent Labs  12/13/12 1028  12/15/12 0526 12/16/12 0610  WBC 12.6*  < > 8.6 8.5  NEUTROABS 8.5*  --   --   --   HGB 13.9  < > 12.3 12.8  HCT 40.4  < > 37.8 38.2  MCV 84.5  < > 84.9 84.9  PLT 304  < > 311 317  < > = values in this interval not displayed.  Lab Results  Component Value Date   INR 0.9 04/06/2008     Recent Labs  12/13/12 1400   TROPONINI <0.30    No results found for this basename: TSH, T4TOTAL, FREET3, T3FREE, THYROIDAB,  in the last 72 hours  No results found for this basename: VITAMINB12, FOLATE, FERRITIN, TIBC, IRON, RETICCTPCT,  in the last 72 hours  Micro Results: Recent Results (from the past 240 hour(s))  CULTURE, BLOOD (ROUTINE X 2)     Status: None   Collection Time    12/13/12 11:15 AM      Result Value Range Status   Specimen Description BLOOD RIGHT WRIST   Final   Special Requests BOTTLES DRAWN AEROBIC AND ANAEROBIC 10CC   Final   Culture  Setup Time     Final   Value: 12/13/2012 21:01     Performed at [facility]   Culture     Final   Value:        BLOOD CULTURE RECEIVED NO GROWTH TO DATE CULTURE WILL BE HELD FOR 5 DAYS BEFORE ISSUING A FINAL NEGATIVE REPORT     Performed at [facility]   Report Status PENDING   Incomplete  CULTURE, BLOOD (ROUTINE X 2)  Status: None   Collection Time    12/13/12  2:21 PM      Result Value Range Status   Specimen Description BLOOD LEFT FOREARM   Final   Special Requests BOTTLES DRAWN AEROBIC ONLY 8CC   Final   Culture  Setup Time     Final   Value: 12/13/2012 21:00     Performed at [facility]   Culture     Final   Value:        BLOOD CULTURE RECEIVED NO GROWTH TO DATE CULTURE WILL BE HELD FOR 5 DAYS BEFORE ISSUING A FINAL NEGATIVE REPORT     Performed at [facility]   Report Status PENDING   Incomplete  URINE CULTURE     Status: None   Collection Time    12/13/12  2:33 PM      Result Value Range Status   Specimen Description URINE, CLEAN CATCH   Final   Special Requests NONE   Final   Culture  Setup Time     Final   Value: 12/13/2012 15:00     Performed at [facility] Count     Final   Value: NO GROWTH     Performed at [facility]   Culture     Final   Value: NO GROWTH     Performed at [facility]   Report Status 12/14/2012 FINAL   Final  CSF CULTURE     Status: None    Collection Time    12/13/12  9:35 PM      Result Value Range Status   Specimen Description CSF   Final   Special Requests NO 2 3CC   Final   Gram Stain     Final   Value: CYTOSPIN WBC PRESENT,BOTH PMN AND MONONUCLEAR     NO ORGANISMS SEEN     Performed at [facility]     Performed at [facility]   Culture     Final   Value: NO GROWTH 1 DAY     Performed at [facility]   Report Status PENDING   Incomplete  GRAM STAIN     Status: None   Collection Time    12/13/12  9:35 PM      Result Value Range Status   Specimen Description CSF   Final   Special Requests NO 2 3CC   Final   Gram Stain     Final   Value: CYTOSPIN SAMPLE     WBC PRESENT,BOTH PMN AND MONONUCLEAR     NO ORGANISMS SEEN   Report Status 12/13/2012 FINAL   Final     Studies/Results: No results found.   Medications: Scheduled: . acyclovir  630 mg Intravenous Q8H  . fluticasone  2 spray Each Nare BID  . gabapentin  300 mg Oral BID  . lactose free nutrition  237 mL Oral TID WC  . levothyroxine  150 mcg Oral QAC breakfast  . lidocaine  1 patch Transdermal Q24H  . ondansetron (ZOFRAN) IV  8 mg Intravenous Once  . pantoprazole  40 mg Oral BID WC  . predniSONE  20 mg Oral Q breakfast   Continuous: . sodium chloride 1,000 mL (12/15/12 2359)     Assessment/Plan: Principal Problem:   Fever Active Problems:   Pleuritic chest pain   SLE (systemic lupus erythematosus)   DI (diabetes insipidus)   Anxiety  Mild Viral Meningitis = Fever/HA/Leukocytosis, and LP  c Elevated WBC/Polys in relatively immunosuppressed SLE pt on Plaquenil and Pred/Depomedrol/Solumedrol.  Glucose was 47, Total Protein CSF 39.  Improving c Sxatic and supportive care.  On Acyclovir until CSF PCR comes back.  Should come back today. Off Triple Abx and ID Consult signed off.  PCR for Enterovirus (-).  Clinical course c/w Viral illness in immunosuppressed individual leading to mild viral meningitis.  Stay c Isolation  until PCRs (-). She had low grade Fever and Tachycardia yesterday and that is still c/w Viral etiology.  It will not keep [place] from D/cing her.  Also she looks the best she has been in 2-3 weeks.  Sphenoid Sinusitis - ? Need for Abx? Will treat c nasal Flonase.  Pleurisy/Chest Pain/Costochondritis c (-) CTPA/(-) ECHO/(-) EKG for Pericarditis and Decent labs. She is much better.  Hyponatremia in pt c DI on admit - Na fine.  Just Rx c IVF.  Resume ddAVP soon. Leukocytosis - follow. SLE - Continue Steroids to avoid Adrenal Insuff.  Off Plaquenil for now and resume as outpt. Migraines - better. Hypothyroidism on Rx.  ID -  Anti-infectives   Start     Dose/Rate Route Frequency Ordered Stop   12/14/12 1400  acyclovir (ZOVIRAX) 630 mg in dextrose 5 % 100 mL IVPB     630 mg 112.6 mL/hr over 60 Minutes Intravenous 3 times per day 12/14/12 1325     12/14/12 0800  hydroxychloroquine (PLAQUENIL) tablet 200 mg  Status:  Discontinued     200 mg Oral 2 times daily with meals 12/13/12 1849 12/14/12 0738   12/14/12 0700  ampicillin (OMNIPEN) 2 g in sodium chloride 0.9 % 50 mL IVPB  Status:  Discontinued     2 g 150 mL/hr over 20 Minutes Intravenous 6 times per day 12/14/12 0453 12/14/12 1205   12/14/12 0600  vancomycin (VANCOCIN) IVPB 1000 mg/200 mL premix  Status:  Discontinued     1,000 mg 200 mL/hr over 60 Minutes Intravenous Every 12 hours 12/14/12 0453 12/14/12 1205   12/14/12 0500  ceFEPIme (MAXIPIME) 2 g in dextrose 5 % 50 mL IVPB  Status:  Discontinued     2 g 100 mL/hr over 30 Minutes Intravenous 3 times per day 12/14/12 0453 12/14/12 1205   12/13/12 2359  cefTRIAXone (ROCEPHIN) 1 g in dextrose 5 % 50 mL IVPB  Status:  Discontinued     1 g 100 mL/hr over 30 Minutes Intravenous Every 12 hours 12/13/12 2304 12/14/12 0450     DVT Prophylaxis but Lovenox on Hold c LP.  SCDs.    LOS: 3 days   Stefanie [name] 12/16/2012, 7:42 AM

## 2012-12-16 NOTE — Progress Notes (Signed)
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 Venofer [Ferric Oxide] Palpitations    Dizziness, sweating, "felt like I was going to pass out" unknown when episode happened  . Azithromycin Other (See Comments)    "couldnt sleep"    Patient Measurements: Height: 5\' 7"  (170.2 cm) Weight: 140 lb 4.8 oz (63.64 kg) IBW/kg (Calculated) : 61.6 Adjusted Body Weight:    Vital Signs: Temp: 98.2 F (36.8 C) (10/10 0617) Temp src: Oral (10/10 0617) BP: 112/76 mmHg (10/10 0617) Pulse Rate: 77 (10/10 0617) Intake/Output from previous day: 10/09 0701 - 10/10 0700 In: 1853.3 [P.O.:1200; I.V.:653.3] Out: 950 [Urine:950] Intake/Output from this shift: Total I/O In: 240 [P.O.:240] Out: -   Labs:  Recent Labs  12/14/12 0715 12/15/12 0526 12/16/12 0610  WBC 7.3 8.6 8.5  HGB 11.8* 12.3 12.8  PLT 298 311 317  CREATININE 0.79 0.74 0.67   Estimated Creatinine Clearance: 80 ml/min (by C-G formula based on Cr of 0.67). No results found for this basename: VANCOTROUGH, VANCOPEAK, VANCORANDOM, GENTTROUGH, GENTPEAK, GENTRANDOM, TOBRATROUGH, TOBRAPEAK, TOBRARND, AMIKACINPEAK, AMIKACINTROU, AMIKACIN,  in the last 72 hours   Microbiology: Recent Results (from the past 720 hour(s))  CULTURE, BLOOD (ROUTINE X 2)     Status: None   Collection Time    12/13/12 11:15 AM      Result Value Range Status   Specimen Description BLOOD RIGHT WRIST   Final   Special Requests BOTTLES DRAWN AEROBIC AND ANAEROBIC 10CC   Final   Culture  Setup Time     Final   Value: 12/13/2012 21:01     Performed at [facility]   Culture     Final   Value:        BLOOD CULTURE RECEIVED NO GROWTH TO DATE CULTURE WILL BE HELD FOR 5 DAYS BEFORE ISSUING A FINAL NEGATIVE REPORT     Performed at [facility]   Report Status PENDING   Incomplete  CULTURE, BLOOD (ROUTINE X 2)     Status: None   Collection Time    12/13/12   2:21 PM      Result Value Range Status   Specimen Description BLOOD LEFT FOREARM   Final   Special Requests BOTTLES DRAWN AEROBIC ONLY 8CC   Final   Culture  Setup Time     Final   Value: 12/13/2012 21:00     Performed at [facility]   Culture     Final   Value:        BLOOD CULTURE RECEIVED NO GROWTH TO DATE CULTURE WILL BE HELD FOR 5 DAYS BEFORE ISSUING A FINAL NEGATIVE REPORT     Performed at [facility]   Report Status PENDING   Incomplete  URINE CULTURE     Status: None   Collection Time    12/13/12  2:33 PM      Result Value Range Status   Specimen Description URINE, CLEAN CATCH   Final   Special Requests NONE   Final   Culture  Setup Time     Final   Value: 12/13/2012 15:00     Performed at [facility] Count     Final   Value: NO GROWTH     Performed at [facility]   Culture     Final   Value: NO GROWTH     Performed at [facility]  Report Status 12/14/2012 FINAL   Final  CSF CULTURE     Status: None   Collection Time    12/13/12  9:35 PM      Result Value Range Status   Specimen Description CSF   Final   Special Requests NO 2 3CC   Final   Gram Stain     Final   Value: CYTOSPIN WBC PRESENT,BOTH PMN AND MONONUCLEAR     NO ORGANISMS SEEN     Performed at [facility]     Performed at [facility]   Culture     Final   Value: NO GROWTH 1 DAY     Performed at [facility]   Report Status PENDING   Incomplete  GRAM STAIN     Status: None   Collection Time    12/13/12  9:35 PM      Result Value Range Status   Specimen Description CSF   Final   Special Requests NO 2 3CC   Final   Gram Stain     Final   Value: CYTOSPIN SAMPLE     WBC PRESENT,BOTH PMN AND MONONUCLEAR     NO ORGANISMS SEEN   Report Status 12/13/2012 FINAL   Final    Anti-infectives   Start     Dose/Rate Route Frequency Ordered Stop   12/14/12 1400  acyclovir (ZOVIRAX) 630 mg in dextrose 5 % 100 mL IVPB     630  mg 112.6 mL/hr over 60 Minutes Intravenous 3 times per day 12/14/12 1325     12/14/12 0800  hydroxychloroquine (PLAQUENIL) tablet 200 mg  Status:  Discontinued     200 mg Oral 2 times daily with meals 12/13/12 1849 12/14/12 0738   12/14/12 0700  ampicillin (OMNIPEN) 2 g in sodium chloride 0.9 % 50 mL IVPB  Status:  Discontinued     2 g 150 mL/hr over 20 Minutes Intravenous 6 times per day 12/14/12 0453 12/14/12 1205   12/14/12 0600  vancomycin (VANCOCIN) IVPB 1000 mg/200 mL premix  Status:  Discontinued     1,000 mg 200 mL/hr over 60 Minutes Intravenous Every 12 hours 12/14/12 0453 12/14/12 1205   12/14/12 0500  ceFEPIme (MAXIPIME) 2 g in dextrose 5 % 50 mL IVPB  Status:  Discontinued     2 g 100 mL/hr over 30 Minutes Intravenous 3 times per day 12/14/12 0453 12/14/12 1205   12/13/12 2359  cefTRIAXone (ROCEPHIN) 1 g in dextrose 5 % 50 mL IVPB  Status:  Discontinued     1 g 100 mL/hr over 30 Minutes Intravenous Every 12 hours 12/13/12 2304 12/14/12 0450      Assessment: fever, leukocytosis, HA, neck stiffness  Infectious Disease: Acyclovir-rx: r/o meningitis vs encephalitis (HSV) vs sinusitis (seen on CT head); Tmax 100.6. WBX down to 8.5;  Acyclovir ordered by ID (LP - lymphocyte/monocyte predominance --> less likely bacterial meningitis); Wt 63.5kg, Crcl 80  Acyclovir 10/8>> Vanc 10/8 not charted Cefepime 10/8 x1 Amp 10/8 x1  10/7: UCx>>NG 10/7: BCx>>pending 10/7: CSF>>pending CSF fluid: gluc, prot wnl, WBC elevated 10/7 HSV PCR >>pending 10/7: Enterovirus PCR: negative  Plan: 1. Continue Acyclovir 630mg  (~10mg /kg) IV q8h 2. Monitor renal fxn, cultures, PCRs and ID recs   Stefanie [name], PharmD, [address] [facility] Clinical Staff Pharmacist Pager [phone]  Stefanie [name] 12/16/2012,10:01 AM

## 2012-12-16 NOTE — Progress Notes (Signed)
[facility]  Date of Admission:  12/13/2012  Antibiotics: Antibiotics Given (last 72 hours)   Date/Time Action Medication Dose Rate   12/14/12 0005 Given   cefTRIAXone (ROCEPHIN) 1 g in dextrose 5 % 50 mL IVPB 1 g 100 mL/hr   12/14/12 0456 Given   ceFEPIme (MAXIPIME) 2 g in dextrose 5 % 50 mL IVPB 2 g 100 mL/hr   12/14/12 0700 Given   ampicillin (OMNIPEN) 2 g in sodium chloride 0.9 % 50 mL IVPB 2 g 150 mL/hr   12/14/12 1512 Given   acyclovir (ZOVIRAX) 630 mg in dextrose 5 % 100 mL IVPB 630 mg 112.6 mL/hr   12/14/12 2148 Given   acyclovir (ZOVIRAX) 630 mg in dextrose 5 % 100 mL IVPB 630 mg 112.6 mL/hr   12/15/12 0636 Given   acyclovir (ZOVIRAX) 630 mg in dextrose 5 % 100 mL IVPB 630 mg 112.6 mL/hr   12/15/12 1421 Given   acyclovir (ZOVIRAX) 630 mg in dextrose 5 % 100 mL IVPB 630 mg 112.6 mL/hr   12/15/12 2359 Given   acyclovir (ZOVIRAX) 630 mg in dextrose 5 % 100 mL IVPB 630 mg 112.6 mL/hr   12/16/12 0656 Given   acyclovir (ZOVIRAX) 630 mg in dextrose 5 % 100 mL IVPB 630 mg 112.6 mL/hr      Subjective: Feels better, though weak  Objective: Temp:  [98.2 F (36.8 C)-100.6 F (38.1 C)] 98.2 F (36.8 C) (10/10 0617) Pulse Rate:  [77-124] 77 (10/10 0617) Resp:  [18] 18 (10/10 0617) BP: (109-119)/(75-80) 112/76 mmHg (10/10 0617) SpO2:  [98 %-100 %] 98 % (10/10 0617) Weight:  [140 lb 4.8 oz (63.64 kg)] 140 lb 4.8 oz (63.64 kg) (10/09 2028)  General: Awake, alert Skin: no rashes Lungs: CTA B Cor: RRR Abdomen: soft, nt, nd Ext: no edema  Lab Results Lab Results  Component Value Date   WBC 8.5 12/16/2012   HGB 12.8 12/16/2012   HCT 38.2 12/16/2012   MCV 84.9 12/16/2012   PLT 317 12/16/2012    Lab Results  Component Value Date   CREATININE 0.67 12/16/2012   BUN 9 12/16/2012   NA 137 12/16/2012   K 3.8 12/16/2012   CL 103 12/16/2012   CO2 22 12/16/2012    Lab Results  Component Value Date   ALT 24 12/13/2012   AST 35 12/13/2012   ALKPHOS 99 12/13/2012   BILITOT 0.6 12/13/2012      Microbiology: Recent Results (from the past 240 hour(s))  CULTURE, BLOOD (ROUTINE X 2)     Status: None   Collection Time    12/13/12 11:15 AM      Result Value Range Status   Specimen Description BLOOD RIGHT WRIST   Final   Special Requests BOTTLES DRAWN AEROBIC AND ANAEROBIC 10CC   Final   Culture  Setup Time     Final   Value: 12/13/2012 21:01     Performed at [facility]   Culture     Final   Value:        BLOOD CULTURE RECEIVED NO GROWTH TO DATE CULTURE WILL BE HELD FOR 5 DAYS BEFORE ISSUING A FINAL NEGATIVE REPORT     Performed at [facility]   Report Status PENDING   Incomplete  CULTURE, BLOOD (ROUTINE X 2)     Status: None   Collection Time    12/13/12  2:21 PM      Result Value Range Status   Specimen  Description BLOOD LEFT FOREARM   Final   Special Requests BOTTLES DRAWN AEROBIC ONLY 8CC   Final   Culture  Setup Time     Final   Value: 12/13/2012 21:00     Performed at [facility]   Culture     Final   Value:        BLOOD CULTURE RECEIVED NO GROWTH TO DATE CULTURE WILL BE HELD FOR 5 DAYS BEFORE ISSUING A FINAL NEGATIVE REPORT     Performed at [facility]   Report Status PENDING   Incomplete  URINE CULTURE     Status: None   Collection Time    12/13/12  2:33 PM      Result Value Range Status   Specimen Description URINE, CLEAN CATCH   Final   Special Requests NONE   Final   Culture  Setup Time     Final   Value: 12/13/2012 15:00     Performed at [facility] Count     Final   Value: NO GROWTH     Performed at [facility]   Culture     Final   Value: NO GROWTH     Performed at [facility]   Report Status 12/14/2012 FINAL   Final  CSF CULTURE     Status: None   Collection Time    12/13/12  9:35 PM      Result Value Range Status   Specimen Description CSF   Final   Special Requests NO 2 3CC   Final   Gram Stain     Final   Value:  CYTOSPIN WBC PRESENT,BOTH PMN AND MONONUCLEAR     NO ORGANISMS SEEN     Performed at [facility]     Performed at [facility]   Culture     Final   Value: NO GROWTH 1 DAY     Performed at [facility]   Report Status PENDING   Incomplete  GRAM STAIN     Status: None   Collection Time    12/13/12  9:35 PM      Result Value Range Status   Specimen Description CSF   Final   Special Requests NO 2 3CC   Final   Gram Stain     Final   Value: CYTOSPIN SAMPLE     WBC PRESENT,BOTH PMN AND MONONUCLEAR     NO ORGANISMS SEEN   Report Status 12/13/2012 FINAL   Final    Studies/Results: No results found.  Assessment/Plan: 1) viral encephalitis vs viral meningitis - symptomatic treatment.   -with significant improvement, this would not be consistent with HSV 1 encephalitis.  I will d/c acyclovir   Jazira Maloney, Molly Maduro, MD [facility] [email or web address] [phone] pager   [phone] cell 12/16/2012, 11:12 AM

## 2012-12-16 NOTE — Discharge Summary (Signed)
Physician Discharge Summary  DISCHARGE SUMMARY   Patient ID: Stefanie [name] MR#: [id number] DOB/AGE: Dec 05, 1960 52 y.o.   Attending Physician:Mattison Stuckey M  Patient's NWG:NFAOZ,HYQM M, MD  Consults:Treatment Team:  Gwen Pounds, MD**  Admit date: 12/13/2012 Discharge date: 12/16/2012  Discharge Diagnoses:  Principal Problem:   Fever Active Problems:   Pleuritic chest pain   SLE (systemic lupus erythematosus)   DI (diabetes insipidus)   Anxiety   Patient Active Problem List   Diagnosis Date Noted  . Fever 12/13/2012  . Pleuritic chest pain 12/06/2012  . SLE (systemic lupus erythematosus) 12/06/2012  . DI (diabetes insipidus) 12/06/2012  . Anxiety 12/06/2012  . ANEMIA-IRON DEFICIENCY 01/08/2010  . GASTRIC DILATION, ACUTE 01/08/2010  . NAUSEA ALONE 01/08/2010  . ABDOMINAL PAIN, LEFT UPPER QUADRANT 01/08/2010   Past Medical History  Diagnosis Date  . Fibromyalgia   . Diabetes insipidus   . Lupus   . Mitral valve prolapse   . SLE (systemic lupus erythematosus) 12/06/2012    Discharged Condition: good   Discharge Medications:   Medication List    STOP taking these medications       nitroGLYCERIN 0.4 MG SL tablet  Commonly known as:  NITROSTAT      TAKE these medications       acetaminophen 500 MG tablet  Commonly known as:  TYLENOL  Take 1,000 mg by mouth every 6 (six) hours as needed for pain.     albuterol 108 (90 BASE) MCG/ACT inhaler  Commonly known as:  PROVENTIL HFA;VENTOLIN HFA  Inhale 1 puff into the lungs every 6 (six) hours as needed for wheezing or shortness of breath.     DDAVP RHINAL TUBE 0.01 % Soln  Generic drug:  desmopressin  Place 1 spray into the nose at bedtime.     fluticasone 50 MCG/ACT nasal spray  Commonly known as:  FLONASE  Place 2 sprays into the nose 2 (two) times daily.     gabapentin 300 MG capsule  Commonly known as:  NEURONTIN  Take 1 capsule (300 mg total) by mouth 2 (two) times daily as needed (Neuropathic  pains).     hydroxychloroquine 200 MG tablet  Commonly known as:  PLAQUENIL  Take 200 mg by mouth 2 (two) times daily.     ibuprofen 200 MG tablet  Commonly known as:  ADVIL,MOTRIN  Take 400 mg by mouth every 6 (six) hours as needed for pain.     INTEGRA PLUS PO  Take 1 tablet by mouth daily.     levothyroxine 150 MCG tablet  Commonly known as:  SYNTHROID, LEVOTHROID  Take 150 mcg by mouth daily before breakfast.     lidocaine 5 %  Commonly known as:  LIDODERM  Place 1 patch onto the skin daily. Remove & Discard patch within 12 hours or as directed by MD     LORazepam 0.5 MG tablet  Commonly known as:  ATIVAN  Take 1 tablet (0.5 mg total) by mouth every 8 (eight) hours as needed for anxiety.     oxyCODONE 5 MG immediate release tablet  Commonly known as:  Oxy IR/ROXICODONE  Take 1 tablet (5 mg total) by mouth every 4 (four) hours as needed.     pantoprazole 40 MG tablet  Commonly known as:  PROTONIX  Take 1 tablet (40 mg total) by mouth 2 (two) times daily with a meal.     predniSONE 20 MG tablet  Commonly known as:  DELTASONE  Take 1 tablet (20  mg total) by mouth daily with breakfast.     rizatriptan 10 MG tablet  Commonly known as:  MAXALT  Take 10 mg by mouth daily as needed for migraine. May repeat in 2 hours if needed     topiramate 100 MG tablet  Commonly known as:  TOPAMAX  Take 100 mg by mouth daily.     VITAMIN D PO  Take 1 capsule by mouth daily.        Hospital Procedures: Dg Chest 2 View  12/13/2012   CLINICAL DATA:  Fever.  Lupus.  EXAM: CHEST  2 VIEW  COMPARISON:  12/06/2012 plain film and CT.  FINDINGS: Midline trachea. Normal heart size and mediastinal contours. No pleural effusion or pneumothorax. Biapical pleural parenchymal scarring. Lower lobe predominant interstitial thickening. Similar. No lobar consolidation.  IMPRESSION: Chronic lower lobe predominant interstitial thickening, without acute superimposed process.   Electronically Signed   By:  Jeronimo Greaves M.D.   On: 12/13/2012 10:56   Dg Chest 2 View  12/06/2012   CLINICAL DATA:  Chest pain chest pain and shortness of breath.  EXAM: CHEST - 2 VIEW  COMPARISON:  07/18/2010  FINDINGS: The heart size and mediastinal contours are within normal limits. Both lungs are clear. The visualized skeletal structures are unremarkable.  IMPRESSION: No active disease.   Electronically Signed   By: Irish Lack   On: 12/06/2012 19:50   Ct Head Wo Contrast  12/13/2012   CLINICAL DATA:  Headache. Fever.  EXAM: CT HEAD WITHOUT CONTRAST  TECHNIQUE: Contiguous axial images were obtained from the base of the skull through the vertex without intravenous contrast.  COMPARISON:  02/01/2008  FINDINGS: There is high density opacification of the entire visualized upper left maxillary sinus along with opacification of multiple ethmoid air cells and complete opacification of the left frontal sinus. There is a large air-fluid level in the sphenoid sinus with bony findings favoring underlying chronic sphenoid sinusitis as well.  The brainstem, cerebellum, cerebral peduncles, thalamus, basal ganglia, basilar cisterns, and ventricular system appear within normal limits. No intracranial hemorrhage, mass lesion, or acute CVA.  IMPRESSION: 1. Acute sphenoid sinusitis with complete opacification of multiple ethmoid air cells, of the left frontal sinus, and of the visualize portion of the left maxillary sinus. Left maxillary sinus opacification is high in density which can sometimes be seen with fungal sinusitis. 2. Otherwise negative exam.   Electronically Signed   By: Herbie Baltimore M.D.   On: 12/13/2012 21:26   Ct Angio Chest Pe W/cm &/or Wo Cm  12/06/2012   CLINICAL DATA:  Chest pain and shortness of Breath.  EXAM: CT ANGIOGRAPHY CHEST WITH CONTRAST  TECHNIQUE: Multidetector CT imaging of the chest was performed using the standard protocol during bolus administration of intravenous contrast. Multiplanar CT image  reconstructions including MIPs were obtained to evaluate the vascular anatomy.  CONTRAST:  OMNIPAQUE IOHEXOL 350 MG/ML SOLN  COMPARISON:  05/13/2010.  FINDINGS: The chest wall is unremarkable. The bony thorax is intact. No acute bony findings or spinal canal compromise.  The heart is normal in size. No pericardial effusion. No mediastinal or hilar mass or adenopathy. Small scattered nodes are noted. The aorta is normal in caliber. No dissection. The esophagus is grossly normal.  The pulmonary arterial tree is well opacified. No filling defects to suggest pulmonary emboli.  The lungs demonstrate mild emphysematous changes and scattered lung cysts. . No acute pulmonary findings. No pleural effusion. No worrisome pulmonary lesions.  The upper  abdomen is unremarkable.  Review of the MIP images confirms the above findings.  IMPRESSION: 1. No CT findings for pulmonary embolism. 2. Normal thoracic aorta. 3. No acute pulmonary findings. Stable emphysematous changes and scattered parenchymal air-filled pulmonary cysts.   Electronically Signed   By: Loralie Champagne M.D.   On: 12/06/2012 20:10   Dg Fluoro Guide Lumbar Puncture  12/14/2012   *RADIOLOGY REPORT*  Clinical Data:Headache, immunosuppression, fever, evaluate for meningitis/encephalitis, initial encounter.  FLUORSCOPIC GUIDED LUMBAR PUNCTURE WITH OPENING AND CLOSING PRESSURES  Comparisons:  Head CT - earlier same day  Intravenous Medications: None  Fluoroscopy Time: 0 minutes, 7 seconds  Complications: None immediate  Technique:  Informed written consent was obtained from the patient after a discussion of the risks, benefits and alternatives to treatment. Questions regarding the procedure were encouraged and answered.  A timeout was performed prior to the initiation of the procedure.  The patient was placed prone, slightly LPO on the fluoroscopy table.  The right at L2 - L3 transverse foramina was marked fluoroscopically.  The skin overlying the operative  site was prepped and draped in the usual sterile fashion.  Local anesthesia was provided with 1% lidocaine.  Under intermittent fluoroscopic guidance, a 22 gauge spinal needle was advanced into the spinal canal.  Appropriate positioning was confirmed with the efflux of clear CSF.  Opening pressure was obtained.  Approximately 12 ml of cerebrospinal fluid was collected into four separate containers.  Closing pressure was obtained. Samples were sent to the Laboratory as requested per the clinical team.  The needle was removed and hemostasis was achieved with manual compression.  A dressing was placed.  The patient tolerated the above procedure well without immediate postprocedural complication.  Findings:  Successful fluoroscopic lumbar puncture yielding approximately 12 ml of clear cerebrospinal fluid  Opening pressure - 22 mmHg  Closing pressure - 16 mmHg.  Impression:  Technically successful fluoroscopic guided lumbar puncture.   Original Report Authenticated By: Tacey Ruiz, MD    History of Present Illness:  20 F immunosuppressed c H/O SLE on Plaquenil and prolonged taper of Prednisone per myself and Dr Corliss Skains.  She was inpt on 12/06/12 - 12/08/12 for Severe R Chest/Shoulder/back pain c/w lupus flare causing pleurisy vrs severe constcochonditis with anxiety and SOB. No Shingles seen. EKG/CXR/CTPA were all (-). She ruled out for MI. ECHO showed EF 55-60% c no wall motion abnormalities, mild MR O/w (-). I treated her pains and discharged her. In the hospital 3 Doses of IV steroids seemed to help the most. Topical Lidoderm, Neurontin, IV and PO Narcotics, IV Toradol - switched back to PO Ibuprofen as an outpatient, and other treatments were employed to help her get out of pain.  She saw me 12/12/12 and was not back to baseline but looked much better but still with pains. She woke up in the early am hrs of 10/7 c  fever up to 102.5 and HA/Migraine. She had issues c N/V. She was given Maxalt but she threw it  up. They called me after our office opened c Sxs of chest and ribs hurting. Fever up to 102.5. N/V. Migraine. Mild Cough. No Sputum. Dry. No Drainage. No Rash. Getting DeHydrated. I Advised her to [facility] ED and called ahead.  Eval in ED showed CMET fine even c Na 131, K 5.1. CBC now with WBC 12.6 but rest of CBC fine. CXR Chronic lower lobe predominant interstitial thickening, without acute superimposed process. Bllod Cxs x 2 done. UA (-).  U Cx P. CCT P; LP Pending. Trop I (-). Sed rate only 20. I asked EDP to get ID Input. Dr Lynelle Doctor talked c Dr Luciana Axe, whoe stated he doubts meningitis but she could have encephalitis, states MR would only be + if has HSV, thinks LP would be better, in addition test for HSV and enterovirus on spinal fluid. States could just be her lupus flare or influenza recommends flu test.  CCT was obtained showing Acute sphenoid sinusitis with complete opacification of multiple ethmoid air cells, of the left frontal sinus, and of the visualize portion of the left maxillary sinus. Left maxillary sinus  opacification is high in density which can sometimes be seen with fungal sinusitis. Otherwise negative exam. LP was attempted by EDP but was unsuccessful and ultimately needed to be done by IR. Given IVF, Zofran, Reglan.  Her hip was fine.  No Focal infectious etiologies. No obv meningeal signs No Local or focal joint issues as well. No Focal Septic Arthralgia.  She was admitted to my service for eval and Rx c full isolation.  Hospital Course: She was admitted 10/7 c Fevers s focal infection in a pt c Slight Leukocytosis, Sig HA while immunosuppressed for her SLE. CCT c/w Acute sphenoid sinusitis and Rocephin started. LP came back + c opening pressure of 22 mmHg and closing pressure of 14 mmHg showed WBC 128-186 in 2 tubes and she was loaded c Abx for possible Bact Meningitis. 30-40% Polys.  Gram stain= WBC PRESENT,BOTH PMN AND MONONUCLEAR; NO ORGANISMS SEEN  PCR for Enterovirus (-).  PCR for  HSV was  .  Flu swab was ordered but not done.  Cx all (-).  ID was consulted.  Dr Luciana Axe thought this was a URI induced mild Viral Meningitis.  He stopped her triple Abx and started IV Acyclovir fountil HSV was (-). Mild Viral Meningitis = Fever/HA/Leukocytosis, and LP c Elevated WBC/Polys in relatively immunosuppressed SLE pt on Plaquenil and Pred/Depomedrol/Solumedrol. Glucose was 47, Total Protein CSF 39. Improving c Sxatic and supportive care. On Acyclovir until CSF PCR came back. Off Triple Abx and ID Consult signed off. PCR for Enterovirus (-). Clinical course c/w Viral illness in immunosuppressed individual leading to mild viral meningitis.  She had low grade Fever and Tachycardia yesterday 10/9 and that is still c/w Viral etiology. It will not keep [place] from D/cing her. Also she looks the best she has been in 2-3 weeks.  TMax 100.6  Some Tachycardia c moving but recent CTPA and ECHO were fine and she is Asxatic. HR and cardiac exam back to normal. HAs better  No N/V - Eating better.  Husband c URI  Chest improved  She looks so much better - lights on. Eyes open. Very interactive.   Sphenoid Sinusitis - ? Need for Abx - I do have her on any and she has improved - Will just treat locally. Will treat c nasal Flonase.  Pleurisy/Chest Pain/Costochondritis c (-) CTPA/(-) ECHO/(-) EKG for Pericarditis and Decent labs. She is much better.  Hyponatremia in pt c DI on admit - Na fine. Resume ddAVP soon.  Leukocytosis - follow.  SLE - Continue Steroids to avoid Adrenal Insuff. Off Plaquenil for now and resume as outpt.  Migraines - better.  Hypothyroidism on Rx.  Labs are great and she looks well - Ok for D/c  Seen by ID - Dr Luciana Axe today - 1) viral encephalitis vs viral meningitis - symptomatic treatment.  -with significant improvement, this would not be consistent  with HSV 1 encephalitis. I will d/c acyclovir  I called the lab and they stated HSV PCR will not be back till Monday - Wednesday.  I  agree sending her home off Acyclovir is very low risk.    Day of Discharge Exam BP 112/76  Pulse 77  Temp(Src) 98.2 F (36.8 C) (Oral)  Resp 18  Ht 5\' 7"  (1.702 m)  Wt 63.64 kg (140 lb 4.8 oz)  BMI 21.97 kg/m2  SpO2 98%  Physical Exam: See PN today  Discharge Labs:  Recent Labs  12/15/12 0526 12/16/12 0610  NA 140 137  K 3.8 3.8  CL 105 103  CO2 23 22  GLUCOSE 91 90  BUN 9 9  CREATININE 0.74 0.67  CALCIUM 8.9 9.1   No results found for this basename: AST, ALT, ALKPHOS, BILITOT, PROT, ALBUMIN,  in the last 72 hours  Recent Labs  12/15/12 0526 12/16/12 0610  WBC 8.6 8.5  HGB 12.3 12.8  HCT 37.8 38.2  MCV 84.9 84.9  PLT 311 317    Recent Labs  12/13/12 1400  TROPONINI <0.30   No results found for this basename: TSH, T4TOTAL, FREET3, T3FREE, THYROIDAB,  in the last 72 hours No results found for this basename: VITAMINB12, FOLATE, FERRITIN, TIBC, IRON, RETICCTPCT,  in the last 72 hours Lab Results  Component Value Date   INR 0.9 04/06/2008       Discharge instructions:  01-Home or Self Care Follow-up Information   Follow up with Gwen Pounds, MD In 7 days. Efraim Kaufmann will call her)    Specialty:  Internal Medicine   Contact information:   [address] [facility] [place] [address] [phone]        Disposition: home  Follow-up Appts: Follow-up with Dr. Timothy Lasso at [facility] in 1-2 weeks.  Call for appointment.  Condition on Discharge: stable. better  Tests Needing Follow-up: None.  Time spent in discharge (includes decision making & examination of pt): 35 min  Signed: Currie Dennin M 12/16/2012, 12:41 PM

## 2012-12-16 NOTE — Progress Notes (Signed)
Patient ID: Stefanie [name], female   DOB: 1960/05/31, 52 y.o.   MRN: [id number]  Subjectively:  She is feeling much better compared to yesterday. She is less fatigued and has no complaints of nausea, vomiting or neck stiffness. Last night she felt feverish and had chills. She has a pressure like, frontal headache upon waking up this morning.  Objectively:  10/10 0617 Temp: 98.2 Pulse: 77 Resp: 18 BP: 112/76 SpO2: 98  Resp: Normal vesicular breathing, no added sounds CVS: S1 + S2+ 0 Abdominal: soft, non tender, gs +ve  Medications Report     Scheduled     Medication Dose/Rate, Route, Frequency Last Action     acyclovir (ZOVIRAX) 630 mg in dextrose 5 % 100 mL IVPB 630 mg, IV, Q8H Given: 10/10 0656     fluticasone (FLONASE) 50 MCG/ACT nasal spray 2 spray 2 spray, EACH NARE, BID Given: 10/10 0000     gabapentin (NEURONTIN) capsule 300 mg 300 mg, PO, BID Given: 10/09 2359     lactose free nutrition (BOOST PLUS) liquid 237 mL 237 mL, PO, TID WC Given: 10/08 1737     levothyroxine (SYNTHROID, LEVOTHROID) tablet 150 mcg 150 mcg, PO, QAC breakfast Given: 10/09 0739     lidocaine (LIDODERM) 5 % 1 patch 1 patch, TD, Q24H Patch Applied: 10/09 1124     ondansetron (ZOFRAN) 8 mg/NS 50 ml IVPB 8 mg, IV, Once Ordered     pantoprazole (PROTONIX) EC tablet 40 mg 40 mg, PO, BID WC Given: 10/09 1754     predniSONE (DELTASONE) tablet 20 mg 20 mg, PO, Q breakfast Given: 10/09 0739       Selected Labs (Up to last 3 results from past 72 hours) Report       10/08 0715   10/09 0526   10/10 0610      WBC 7.3  8.6  8.5     RBC 4.28  4.45  4.50     Hemoglobin 11.8   12.3  12.8     HCT 36.7  37.8  38.2     Platelets 298  311  317     Sodium 138  140  137     Potassium 4.1  3.8  3.8     Chloride 105  105  103     CO2 24  23  22      BUN 13  9  9      Creatinine 0.79  0.74  0.67     Calcium 8.8  8.9  9.1     Troponin I

## 2012-12-16 NOTE — Progress Notes (Signed)
Patient discharged home with daughter. Patient was given discharge paperwork and prescriptions. Patient was told to follow up with appointment next week. Patient was told to continue home medication as instructed. Patient was told to contact doctor with questions and concerns. Patient walked out of hospital with nurse tech. Patient was stable upon discharge.

## 2012-12-17 LAB — CSF CULTURE W GRAM STAIN: Culture: NO GROWTH

## 2012-12-17 LAB — CSF CULTURE

## 2012-12-19 LAB — CULTURE, BLOOD (ROUTINE X 2)
Culture: NO GROWTH
Culture: NO GROWTH

## 2012-12-19 LAB — HSV(HERPES SMPLX VRS)ABS-I+II(IGG)-CSF

## 2013-01-12 ENCOUNTER — Other Ambulatory Visit: Payer: Self-pay

## 2013-09-18 ENCOUNTER — Telehealth: Payer: Self-pay | Admitting: Neurology

## 2013-09-18 NOTE — Telephone Encounter (Signed)
The patient has not been seen at our office since 01/2012.  I tried to call the patient, got no answer.  Called the pharmacy.  Spoke with Molson Co[employer] BrewingMelanie.  She said they will reach out to the patient as well.

## 2013-09-18 NOTE — Telephone Encounter (Signed)
[facility] is calling to get new Rx's for Maxalt and Topamax for patient--patient has never had Rx's filled at this location before--thank you.

## 2013-10-13 ENCOUNTER — Other Ambulatory Visit (HOSPITAL_COMMUNITY): Payer: Self-pay | Admitting: Internal Medicine

## 2013-10-13 DIAGNOSIS — Z1231 Encounter for screening mammogram for malignant neoplasm of breast: Secondary | ICD-10-CM

## 2013-11-27 ENCOUNTER — Ambulatory Visit (HOSPITAL_COMMUNITY)
Admission: RE | Admit: 2013-11-27 | Discharge: 2013-11-27 | Disposition: A | Discharge status: Home / Self Care | Payer: Medicare HMO | Source: Ambulatory Visit | Attending: Internal Medicine | Admitting: Internal Medicine

## 2013-11-27 ENCOUNTER — Ambulatory Visit (HOSPITAL_COMMUNITY): Payer: 59

## 2013-11-27 DIAGNOSIS — Z1231 Encounter for screening mammogram for malignant neoplasm of breast: Secondary | ICD-10-CM

## 2013-12-12 ENCOUNTER — Inpatient Hospital Stay (HOSPITAL_COMMUNITY)
Admission: AD | Admit: 2013-12-12 | Discharge: 2013-12-13 | DRG: 641 | Disposition: A | Discharge status: Home / Self Care | Payer: Managed Care, Other (non HMO) | Source: Ambulatory Visit | Attending: Internal Medicine | Admitting: Internal Medicine

## 2013-12-12 ENCOUNTER — Inpatient Hospital Stay (HOSPITAL_COMMUNITY): Payer: Managed Care, Other (non HMO)

## 2013-12-12 ENCOUNTER — Encounter (HOSPITAL_COMMUNITY): Payer: Self-pay | Admitting: *Deleted

## 2013-12-12 ENCOUNTER — Ambulatory Visit (HOSPITAL_COMMUNITY): Admission: RE | Admit: 2013-12-12 | Payer: Managed Care, Other (non HMO) | Source: Ambulatory Visit

## 2013-12-12 ENCOUNTER — Ambulatory Visit (HOSPITAL_COMMUNITY)
Admission: RE | Admit: 2013-12-12 | Payer: Managed Care, Other (non HMO) | Source: Ambulatory Visit | Admitting: Internal Medicine

## 2013-12-12 DIAGNOSIS — K219 Gastro-esophageal reflux disease without esophagitis: Secondary | ICD-10-CM | POA: Diagnosis present

## 2013-12-12 DIAGNOSIS — Z888 Allergy status to other drugs, medicaments and biological substances status: Secondary | ICD-10-CM

## 2013-12-12 DIAGNOSIS — I341 Nonrheumatic mitral (valve) prolapse: Secondary | ICD-10-CM | POA: Diagnosis present

## 2013-12-12 DIAGNOSIS — Z881 Allergy status to other antibiotic agents status: Secondary | ICD-10-CM | POA: Diagnosis not present

## 2013-12-12 DIAGNOSIS — D72819 Decreased white blood cell count, unspecified: Secondary | ICD-10-CM | POA: Diagnosis present

## 2013-12-12 DIAGNOSIS — D638 Anemia in other chronic diseases classified elsewhere: Secondary | ICD-10-CM | POA: Diagnosis present

## 2013-12-12 DIAGNOSIS — Z79899 Other long term (current) drug therapy: Secondary | ICD-10-CM | POA: Diagnosis not present

## 2013-12-12 DIAGNOSIS — R112 Nausea with vomiting, unspecified: Secondary | ICD-10-CM | POA: Diagnosis present

## 2013-12-12 DIAGNOSIS — E860 Dehydration: Secondary | ICD-10-CM | POA: Diagnosis present

## 2013-12-12 DIAGNOSIS — M329 Systemic lupus erythematosus, unspecified: Secondary | ICD-10-CM | POA: Diagnosis present

## 2013-12-12 DIAGNOSIS — M797 Fibromyalgia: Secondary | ICD-10-CM | POA: Diagnosis present

## 2013-12-12 DIAGNOSIS — E871 Hypo-osmolality and hyponatremia: Secondary | ICD-10-CM | POA: Diagnosis present

## 2013-12-12 DIAGNOSIS — I10 Essential (primary) hypertension: Secondary | ICD-10-CM | POA: Diagnosis present

## 2013-12-12 DIAGNOSIS — I7300 Raynaud's syndrome without gangrene: Secondary | ICD-10-CM | POA: Diagnosis present

## 2013-12-12 DIAGNOSIS — E039 Hypothyroidism, unspecified: Secondary | ICD-10-CM | POA: Diagnosis present

## 2013-12-12 DIAGNOSIS — G43909 Migraine, unspecified, not intractable, without status migrainosus: Secondary | ICD-10-CM | POA: Diagnosis present

## 2013-12-12 DIAGNOSIS — M3500 Sicca syndrome, unspecified: Secondary | ICD-10-CM | POA: Diagnosis present

## 2013-12-12 LAB — COMPREHENSIVE METABOLIC PANEL
ALT: 15 U/L (ref 0–35)
ANION GAP: 13 (ref 5–15)
AST: 23 U/L (ref 0–37)
Albumin: 3.5 g/dL (ref 3.5–5.2)
Alkaline Phosphatase: 121 U/L — ABNORMAL HIGH (ref 39–117)
BUN: 8 mg/dL (ref 6–23)
CALCIUM: 9.1 mg/dL (ref 8.4–10.5)
CO2: 22 meq/L (ref 19–32)
CREATININE: 0.53 mg/dL (ref 0.50–1.10)
Chloride: 84 mEq/L — ABNORMAL LOW (ref 96–112)
GFR calc Af Amer: >90 mL/min (ref >90)
GFR calc non Af Amer: >90 mL/min (ref >90)
Glucose, Bld: 83 mg/dL (ref 70–99)
Potassium: 3.9 mEq/L (ref 3.7–5.3)
Sodium: 119 mEq/L — CL (ref 137–147)
Total Bilirubin: 0.4 mg/dL (ref 0.3–1.2)
Total Protein: 8 g/dL (ref 6.0–8.3)

## 2013-12-12 LAB — CBC
HCT: 31.2 % — ABNORMAL LOW (ref 36.0–46.0)
Hemoglobin: 10.9 g/dL — ABNORMAL LOW (ref 12.0–15.0)
MCH: 27 pg (ref 26.0–34.0)
MCHC: 34.9 g/dL (ref 30.0–36.0)
MCV: 77.4 fL — ABNORMAL LOW (ref 78.0–100.0)
PLATELETS: 286 10*3/uL (ref 150–400)
RBC: 4.03 MIL/uL (ref 3.87–5.11)
RDW: 13.3 % (ref 11.5–15.5)
WBC: 3.5 10*3/uL — ABNORMAL LOW (ref 4.0–10.5)

## 2013-12-12 LAB — PHOSPHORUS: PHOSPHORUS: 3.2 mg/dL (ref 2.3–4.6)

## 2013-12-12 LAB — TSH: TSH: 0.043 u[IU]/mL — ABNORMAL LOW (ref 0.350–4.500)

## 2013-12-12 LAB — MAGNESIUM: MAGNESIUM: 2.1 mg/dL (ref 1.5–2.5)

## 2013-12-12 MED ORDER — ONDANSETRON HCL 4 MG PO TABS: 4.0000 mg | ORAL_TABLET | Freq: Four times a day (QID) | ORAL | Status: DC | PRN

## 2013-12-12 MED ORDER — ENOXAPARIN SODIUM 40 MG/0.4ML ~~LOC~~ SOLN
40.0000 mg | SUBCUTANEOUS | Status: DC
  Administered 2013-12-12: 40 mg via SUBCUTANEOUS
  Filled 2013-12-12 (×2): qty 0.4

## 2013-12-12 MED ORDER — LEVOTHYROXINE SODIUM 150 MCG PO TABS
150.0000 ug | ORAL_TABLET | Freq: Every day | ORAL | Status: DC
  Administered 2013-12-13: 150 ug via ORAL
  Filled 2013-12-12 (×2): qty 1

## 2013-12-12 MED ORDER — ACETAMINOPHEN 650 MG RE SUPP: 650.0000 mg | Freq: Four times a day (QID) | RECTAL | Status: DC | PRN

## 2013-12-12 MED ORDER — TRAMADOL HCL 50 MG PO TABS
50.0000 mg | ORAL_TABLET | Freq: Two times a day (BID) | ORAL | Status: DC | PRN
  Filled 2013-12-12: qty 1

## 2013-12-12 MED ORDER — SODIUM CHLORIDE 0.9 % IJ SOLN: 3.0000 mL | Freq: Two times a day (BID) | INTRAMUSCULAR | Status: DC

## 2013-12-12 MED ORDER — ACETAMINOPHEN 325 MG PO TABS
650.0000 mg | ORAL_TABLET | Freq: Four times a day (QID) | ORAL | Status: DC | PRN
  Administered 2013-12-13: 650 mg via ORAL
  Filled 2013-12-12 (×2): qty 2

## 2013-12-12 MED ORDER — DESMOPRESSIN ACE REFRIGERATED 0.01 % NA SOLN: 1.0000 [drp] | Freq: Every day | NASAL | Status: DC

## 2013-12-12 MED ORDER — FLUTICASONE PROPIONATE 50 MCG/ACT NA SUSP: 2.0000 | Freq: Two times a day (BID) | NASAL | Status: DC

## 2013-12-12 MED ORDER — LORAZEPAM 0.5 MG PO TABS: 0.5000 mg | ORAL_TABLET | Freq: Three times a day (TID) | ORAL | Status: DC | PRN

## 2013-12-12 MED ORDER — SODIUM CHLORIDE 0.9 % IV SOLN
INTRAVENOUS | Status: DC
  Administered 2013-12-12: 17:00:00 via INTRAVENOUS

## 2013-12-12 MED ORDER — ONDANSETRON HCL 4 MG/2ML IJ SOLN: 4.0000 mg | Freq: Four times a day (QID) | INTRAMUSCULAR | Status: DC | PRN

## 2013-12-12 MED ORDER — TOPIRAMATE 100 MG PO TABS
100.0000 mg | ORAL_TABLET | Freq: Every day | ORAL | Status: DC
  Administered 2013-12-12: 100 mg via ORAL
  Filled 2013-12-12 (×2): qty 1

## 2013-12-12 NOTE — H&P (Signed)
Vital Signs  Entered weight:  146  lbs., Calculated Weight: 146 lbs., ( 66.23 kg) Height: 67.5 in., ( 171.45 cm) Temperature: 98.1 deg F, Temperature site: oral Pulse rate: 72 Pulse rhythm: regular Respirations: 15  Blood Pressure #1: 130 / 88 mm Hg    BMI: 22.53 BSA: 1.78 Wt Chg: 3 lbs since 10/02/2013  Vitals entered by: Prudencio Pair, CMA on December 12, 2013 2:44 PM           Risk Factors:   Smoked Tobacco Use:  Never smoker Smokeless Tobacco Use:  Never Passive smoke exposure:  no Drug use:  no HIV high-risk behavior:  no Caffeine use:  1 drinks per day Alcohol use:  no Exercise:  yes    Times per week:  3- 2.57mles     Type of Exercise:  walk Seatbelt use:  100 % Sun Exposure:  occasionally  Previous Tobacco Use: Signed On - 10/02/2013 Smoked Tobacco Use:  Never smoker Smokeless Tobacco Use:  Never Passive smoke exposure:  no Drug use:  no HIV high-risk behavior:  no Caffeine use:  1 drinks per day  Previous Alcohol Use: Signed On - 10/02/2013 Alcohol use:  no Exercise:  yes    Times per week:  3- 2.547mes     Type of Exercise:  walk Seatbelt use:  100 % Sun Exposure:  occasionally  Colonoscopy History:    Date of Last Colonoscopy:  05/06/2004  Mammogram History:    Date of Last Mammogram:  11/27/2013  PAP Smear History:    Date of Last PAP Smear:  02/26/2012  History of Present Illness  History from: patient Chief Complaint: Patient has been having nausea, frequent bowel movements with loose x 3 days. She has no energy, bad headache on top of head and all extremities were cramping up one night. She was told that these were signs of her Sodium level dropping.  History of Present Illness: 53 y/o F with known hx hyponatremia and severe sodium sensitivity on DDAVP nasal spray. She is on Mtx, Plaquenil for SLE.  Presents today c a few days c/o increasing lethary and mild nausea.  Has been trying to "pour in the sodium" over past wk, but over w/e  developed loose stools 2-3 day, no diarrhea, just really loose.  Lethary increased to where doesn't feel can put one foot in front of other.  No vomiting  fever, chills, abd pain Body has started to cramp and just very uncomfortable No recent travel  Wt up 3 lbs  Last sodium level 130 in July Anemia chronic illness, last Hgb 10.9 in July as well      Review of Systems  General:       Complains of fatigue, malaise.        Denies fevers, chills, sweats, anorexia, weight loss.   Eyes:       Denies blurring, irritation, discharge.   Ears/Nose/Throat:       Denies earache, nasal congestion, nosebleeds, sore throat, hoarseness.   Cardiovascular:       Denies chest pains, palpitations, syncope, dyspnea on exertion, orthopnea, peripheral edema.   Respiratory:       Denies cough.   Gastrointestinal:       Complains of nausea, diarrhea.        Denies vomiting, constipation, abdominal pain, melena, hematochezia.   Genitourinary:       Denies vaginal discharge, incontinence, dysuria, hematuria, nocturia, urinary frequency.   Musculoskeletal:       Denies  back pain.   Skin:       Denies rash, itching, dryness.   Neurologic:       Complains of weakness.        Denies vertigo, dizziness.   Endocrine:       Denies polydipsia, polyphagia, polyuria.   Heme/Lymphatic:       Denies abnormal bruising, bleeding, enlarged lymph nodes.    Past History Past Medical History (reviewed - no changes required): SLE/Sjogrens/Fibromyalgia.   Lupus Flare with rash and ?Vasculitis in Jan 2010 S/P months of Prednisone.   Raynauds.  GERD.   Migraines.  Pituitary Adenoma S/P Pituitay Resection.   Primary Hypothyroidism.   HTN.   DI.   Atypical CP S/P negative w/u.  tongue nodule.   ANEMIA.  S/P Shingles.   BROKEN 4TH TOE 10/04.   Ovarian Cyst.   11/2012 Admission for Pleurisy  12/2012 Viral Meningitis.    Past Medical History:  Past Medical History  Diagnosis Date  . Fibromyalgia   . Diabetes  insipidus   . Lupus   . Mitral valve prolapse   . SLE (systemic lupus erythematosus) 12/06/2012    Past Surgical History  Procedure Laterality Date  . No past surgeries        Allergies:   Allergies  Allergen Reactions  . Venofer [Ferric Oxide] Palpitations    Dizziness, sweating, "felt like I was going to pass out" unknown when episode happened  . Azithromycin Other (See Comments)    "couldnt sleep"    Surgical History (reviewed - no changes required): Pit Adenoma Resection. Family History (reviewed - no changes required): PT'S FATHER passed away 2007-05-12 at 70 WITH CAD AND CVA.  PT'S MOTHER 78 IS HEALTHY.  GRANDPARENTS HAVE LUNG CANCCER. SISTER IS ALSO A PT (SOPHIA HONEYCUTT) WITH DM. Social History (reviewed - no changes required): PT IS MARRIED WITH TWO CHILDREN, A SON AND A DAUGHTER. Granddaughter. 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 NON SMOKER AND NON ALCOHOL.  Family History Summary:     Reviewed history Last on 10/02/2013 and no changes required:12/12/2013 Father Ailene Ravel.) - Has Family History of Stroke/CVA - Entered On: 09/13/2012 Father Ailene Ravel.) - Has Family History of Heart Disease - Entered On: 12/12/2013  General Comments - FH: PT'S FATHER passed away 12-May-2007 at 37 WITH CAD AND CVA.  PT'S MOTHER 78 IS HEALTHY.  GRANDPARENTS HAVE LUNG CANCCER. SISTER IS ALSO A PT (SOPHIA HONEYCUTT) WITH DM.  Social History:    Reviewed history from 05/31/2012 and no changes required:       PT IS MARRIED WITH TWO CHILDREN, A SON AND A DAUGHTER. Granddaughter. 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 NON SMOKER AND NON ALCOHOL.   Physical Exam    Physical Exam:  Blood pressure 150/83, pulse 101, temperature 97.7 F (36.5 C), temperature source Oral, resp. rate 20, height '5\' 8"'  (1.727 m), weight 64.683 kg (142 lb 9.6 oz), SpO2 100.00%. Filed Vitals:   12/12/13 1500  BP: 150/83  Pulse: 101  Temp: 97.7 F (36.5 C)  TempSrc: Oral  Resp: 20  Height: '5\' 8"'   (1.727 m)  Weight: 64.683 kg (142 lb 9.6 oz)  SpO2: 100%   General appearance: lethargic, pale  Eyes  External: conjunctivae and lids normal  Ears, Nose and Throat  Nasal: allergic changes Pharynx: tongue normal, protrudes mid line,  posterior pharynx without erythema or exudate  Neck  Neck: supple, no masses, trachea midline  Respiratory  Respiratory effort:  no intercostal retractions or use of accessory muscles Auscultation: no rales, rhonchi, or wheezes  Cardiovascular  Auscultation: S1, S2, without m/g/r Pedal pulses: pulses 2+, symmetric Periph. circulation: no cyanosis, clubbing, edema  Gastrointestinal  Abdomen: soft, non-tender, no masses, bowel sounds normal.  Liver and spleen: no enlargement or nodularity  Lymphatic  Neck: no cervical adenopathy  Musculoskeletal  Gait and station: normal  Skin  Inspection: no rashes, lesions  Neurologic  Sensation: intact to touch, vibration Speech: Normal  Mental Status Exam  Orientation: oriented to time, place, and person   Problem # 1:  Muscle cramps (ICD-729.82) (ICD10-R25.2) Assessment: New Due to profound hyponatremia, 117 We discussed options and she and I elect to have overnight admission for treatment   Problem # 2:  Dehydration (ICD-276.51) (ICD10-E86.0) Assessment: New IVF hyponatremia with weakness setting up overnight admission for hydration and sodium replenishment she is agreeable and asks for this as has been trying to orally replish without success  Problem # 3:  HYPONATREMIA (ICD-276.1) (ICD10-E87.1) Assessment: Deteriorated  Sodium down to 117 from 130 other electrolytes ok admission for treatment  Problem # 4:  ANEMIA OF CHRONIC DISEASE (ICD-285.29) (ICD10-D63.8) Assessment: Unchanged  Problem # 5:  SYSTEMIC LUPUS ERYTHEMATOSUS (ICD-710.0) (ICD10-M32.10) Assessment: Unchanged Off MTX and Plaquenil during hospital stay  Problem # 6:  HYPOTHYROIDISM (ICD-244.9)  (ICD10-E03.9) Assessment: Unchanged Take Medications on empty stomach as directed. TSH: 0.005 (10/03/2013 5:39:00 AM) Free T4:  1.96 (10/03/2013 5:39:00 AM) T3:  102.0 (12/02/2012 2:17:00 PM)   Problem # 7:  Fatigue (ICD-780.79) (ICD10-R53.83) deteriorated due to worening hyponatremia  Problem # 8:  MIGRAINE HEADACHE (ICD-346.90) (ICD10-G43.909)  Complete Medication List: 1)  Claritin 10 Mg Oral Caps (Loratadine) .... Take qd 2)  Methotrexate 2.5 Mg Oral Tabs (Methotrexate sodium) .... 4 po once weekly has yet to start 3)  Protonix 40 Mg Tbec (Pantoprazole sodium) .[name].. 1 po bid w/ meals 4)  Plaquenil 200 Mg Tabs (Hydroxychloroquine sulfate) .... 2 po daily 5)  Proair Hfa 108 (90 Base) Mcg/act Aers (Albuterol sulfate) .... 2 puffs every 6 hours  as needed 6)  Ddavp 0.01 % Soln (Desmopressin acetate spray) .... Use 1 spray qhs 7)  Nitroglycerin 0.4 Mg Subl (Nitroglycerin) .... Prn 8)  Synthroid 150 Mcg Tabs (Levothyroxine sodium) .[name].. 1 po qd except 1/2 every wed. 9)  Topamax 100 Mg Tabs (Topiramate) .[name].. 1 po qhs 10)  Vitamin D 1000 Unit Caps (Cholecalciferol) .... Take 2 po qd 11)  Ventolin Hfa 108 (90 Base) Mcg/act Aers (Albuterol sulfate) .... Inhale 2 puffs by mouth twice daily as needed   Medications: Prior to Admission medications   Medication Sig Start Date End Date Taking? Authorizing Provider  acetaminophen (TYLENOL) 500 MG tablet Take 1,000 mg by mouth every 6 (six) hours as needed for pain.   Yes Historical Provider, MD  albuterol (PROVENTIL HFA;VENTOLIN HFA) 108 (90 BASE) MCG/ACT inhaler Inhale 1 puff into the lungs every 6 (six) hours as needed for wheezing or shortness of breath.   Yes Historical Provider, MD  amoxicillin (AMOXIL) 500 MG capsule Take 500 mg by mouth 3 (three) times daily. For 10 days, starting 12/06/2013.   Yes Historical Provider, MD  Cholecalciferol (VITAMIN D PO) Take 1 capsule by mouth daily.   Yes Historical Provider, MD  desmopressin (DDAVP RHINAL  TUBE) 0.01 % SOLN Place 1 spray into the nose at bedtime.   Yes Historical Provider, MD  ibuprofen (ADVIL,MOTRIN) 200 MG tablet Take 400 mg by mouth every 6 (six)  hours as needed for pain.   Yes Historical Provider, MD  levothyroxine (SYNTHROID, LEVOTHROID) 150 MCG tablet Take 150 mcg by mouth daily before breakfast.   Yes Historical Provider, MD  rizatriptan (MAXALT) 10 MG tablet Take 10 mg by mouth daily as needed for migraine. May repeat in 2 hours if needed   Yes Historical Provider, MD  topiramate (TOPAMAX) 100 MG tablet Take 100 mg by mouth daily.   Yes Historical Provider, MD     Medications Prior to Admission  Medication Sig Dispense Refill  . acetaminophen (TYLENOL) 500 MG tablet Take 1,000 mg by mouth every 6 (six) hours as needed for pain.      [name] albuterol (PROVENTIL HFA;VENTOLIN HFA) 108 (90 BASE) MCG/ACT inhaler Inhale 1 puff into the lungs every 6 (six) hours as needed for wheezing or shortness of breath.      [name] amoxicillin (AMOXIL) 500 MG capsule Take 500 mg by mouth 3 (three) times daily. For 10 days, starting 12/06/2013.      [name] Cholecalciferol (VITAMIN D PO) Take 1 capsule by mouth daily.      [name] desmopressin (DDAVP RHINAL TUBE) 0.01 % SOLN Place 1 spray into the nose at bedtime.      [name] ibuprofen (ADVIL,MOTRIN) 200 MG tablet Take 400 mg by mouth every 6 (six) hours as needed for pain.      [name] levothyroxine (SYNTHROID, LEVOTHROID) 150 MCG tablet Take 150 mcg by mouth daily before breakfast.      . rizatriptan (MAXALT) 10 MG tablet Take 10 mg by mouth daily as needed for migraine. May repeat in 2 hours if needed      . topiramate (TOPAMAX) 100 MG tablet Take 100 mg by mouth daily.         BASIC METABOLIC PANEL (5916)   GLUCOSE                   94 mg/dl                    60-110   BUN                       9 mg/dl                     5-23   CREATININE                0.6 mg/dl                   0.3-1.5  eGFR Non-African American                             104.6  eGFR African  American                             126.5   SODIUM               [LL] 117 mEq/L                   135-148     RES=RESULT VERIFIED AND REPORTED TO PHYSICIAN   POTASSIUM                 4.4 mEq/L                   3.5-5.3   CHLORIDE  89 mEq/L                    80-111   CO2                       25 mEq/L                    15-35   CALCIUM                   9.4 mg/dL                   7.0-10.5  Tests: (2) CBC (2000)   WBC                       4.40 K/uL                   4.10-10.90   LYM                       1.0 K/uL                    0.6-4.1 ! MID                       0.4 K/uL                    0.0-1.8   GRAN                      3.0 K/uL                    2.0-7.8   LYM%                      23.1 %                      10.0-58.5 ! MID%                      9.0 %                       0.1-24.0   GRAN%                     67.9                        37.0-92.0   RBC                       4.2 M/uL                    4.2-6.3   HGB                       12.1 g/dL                   12.0-18.0   HCT                  [L]  34.2 %                      37.0-51.0   MCV  81.3 fL                     80.0-97.0   MCH                       28.7 pg                     26.0-32.0   MCHC                      35.4 g/dL                   31.0-36.0   PLT                       270 K/uL                    140-440  Stefanie [name] is an 53 y.o. female.   PCP:   Precious Reel, MD     Assessment/Plan Active Problems:   Hyponatremia   Joah Patlan M 12/12/2013, 6:24 PM ]

## 2013-12-12 NOTE — Progress Notes (Signed)
Patient admitted from doctor's office, alert and oriented, denies any pain/distress, oriented patient to room/unit/hospital; reviewed plan of care with patient/family.

## 2013-12-12 NOTE — Progress Notes (Signed)
CRITICAL VALUE ALERT  Critical value received:  NA 119  Date of notification:  12/12/13  Time of notification:  2048  Critical value read back:Yes.    Nurse who received alert:  Charolotte Capuchiniffany Loghan Subia, RN  MD notified (1st page):  Dr. Timothy Lassousso  Time of first page:  2107  MD notified (2nd page):  Time of second page:  Responding MD: Dr Timothy Lassousso   Time MD responded:  2115  No new orders received, will continue to monitor pt .

## 2013-12-13 LAB — BASIC METABOLIC PANEL
ANION GAP: 10 (ref 5–15)
ANION GAP: 14 (ref 5–15)
BUN: 11 mg/dL (ref 6–23)
BUN: 11 mg/dL (ref 6–23)
CALCIUM: 9.1 mg/dL (ref 8.4–10.5)
CO2: 23 mEq/L (ref 19–32)
CO2: 19 mEq/L (ref 19–32)
CREATININE: 0.67 mg/dL (ref 0.50–1.10)
Calcium: 9.1 mg/dL (ref 8.4–10.5)
Chloride: 99 mEq/L (ref 96–112)
Chloride: 102 mEq/L (ref 96–112)
Creatinine, Ser: 0.68 mg/dL (ref 0.50–1.10)
GFR calc Af Amer: >90 mL/min (ref >90)
GFR calc Af Amer: >90 mL/min (ref >90)
GLUCOSE: 113 mg/dL — AB (ref 70–99)
Glucose, Bld: 83 mg/dL (ref 70–99)
Potassium: 4 mEq/L (ref 3.7–5.3)
Potassium: 4.2 mEq/L (ref 3.7–5.3)
SODIUM: 132 meq/L — AB (ref 137–147)
SODIUM: 135 meq/L — AB (ref 137–147)

## 2013-12-13 MED ORDER — LEVOTHYROXINE SODIUM 150 MCG PO TABS: ORAL_TABLET | ORAL | Status: DC

## 2013-12-13 MED ORDER — FOLIC ACID 1 MG PO TABS: ORAL_TABLET | ORAL | Status: DC

## 2013-12-13 MED ORDER — HYDROXYCHLOROQUINE SULFATE 200 MG PO TABS: 200.0000 mg | ORAL_TABLET | Freq: Every day | ORAL | Status: DC

## 2013-12-13 MED ORDER — AMOXICILLIN 500 MG PO CAPS
500.0000 mg | ORAL_CAPSULE | Freq: Three times a day (TID) | ORAL | Status: DC
  Administered 2013-12-13: 500 mg via ORAL
  Filled 2013-12-13 (×4): qty 1

## 2013-12-13 NOTE — Progress Notes (Signed)
Subjective: Admitted c N/V/D - DeH and Hyponatremia.  HA improved Received IVF overnight and already better Na 132. Nausea better but poor appetite. Last loose stool was last night.  Objective: Vital signs in last 24 hours: Temp:  [97.7 F (36.5 C)-98 F (36.7 C)] 98 F (36.7 C) (10/07 0551) Pulse Rate:  [59-101] 71 (10/07 0551) Resp:  [18-20] 18 (10/06 2145) BP: (97-150)/(66-83) 97/66 mmHg (10/07 0551) SpO2:  [100 %] 100 % (10/07 0551) Weight:  [60.737 kg (133 lb 14.4 oz)-64.683 kg (142 lb 9.6 oz)] 60.737 kg (133 lb 14.4 oz) (10/07 0551) Weight change:  Last BM Date: 12/12/13  CBG (last 3)  No results found for this basename: GLUCAP,  in the last 72 hours  Intake/Output from previous day:  Intake/Output Summary (Last 24 hours) at 12/13/13 0852 Last data filed at 12/13/13 0800  Gross per 24 hour  Intake    636 ml  Output   2500 ml  Net  -1864 ml   10/06 0701 - 10/07 0700 In: 400 [P.O.:400] Out: 2300 [Urine:2300]   Physical Exam  General appearance: A and O Eyes: no scleral icterus Throat: oropharynx moist without erythema Poor Dentition Resp: CTA Cardio: Reg GI: soft, non-tender; bowel sounds normal; no masses,  no organomegaly Extremities: no clubbing, cyanosis or edema   Lab Results:  Recent Labs  12/12/13 2001 12/13/13 0405  NA 119* 132*  K 3.9 4.0  CL 84* 99  CO2 22 23  GLUCOSE 83 83  BUN 8 11  CREATININE 0.53 0.67  CALCIUM 9.1 9.1  MG 2.1  --   PHOS 3.2  --      Recent Labs  12/12/13 2001  AST 23  ALT 15  ALKPHOS 121*  BILITOT 0.4  PROT 8.0  ALBUMIN 3.5     Recent Labs  12/12/13 2001  WBC 3.5*  HGB 10.9*  HCT 31.2*  MCV 77.4*  PLT 286    Lab Results  Component Value Date   INR 0.9 04/06/2008    No results found for this basename: CKTOTAL, CKMB, CKMBINDEX, TROPONINI,  in the last 72 hours   Recent Labs  12/12/13 2000  TSH 0.043*    No results found for this basename: VITAMINB12, FOLATE, FERRITIN, TIBC, IRON,  RETICCTPCT,  in the last 72 hours  Micro Results: No results found for this or any previous visit (from the past 240 hour(s)).   Studies/Results: Portable Chest 1 View  12/12/2013   CLINICAL DATA:  Initial evaluation of a 53 year old female patient with history of lupus presenting with hyponatremia.  EXAM: PORTABLE CHEST - 1 VIEW  COMPARISON:  Chest x-ray 12/13/2012.  FINDINGS: Mild diffuse coarsening of the interstitial markings and diffuse peribronchial cuffing. No confluent consolidative airspace disease. No pleural effusions. No evidence of pulmonary edema. Heart size is normal. Upper mediastinal contours are within normal limits. Mild bilateral apical nodular pleuroparenchymal thickening, similar to the prior examination, presumably chronic post infectious or inflammatory scarring.  IMPRESSION: 1. The appearance of the chest suggests bronchitis, as above.   Electronically Signed   By: Trudie Reed M.D.   On: 12/12/2013 18:30     Medications: Scheduled: . desmopressin  1 drop Nasal QHS  . enoxaparin (LOVENOX) injection  40 mg Subcutaneous Q24H  . levothyroxine  150 mcg Oral QAC breakfast  . sodium chloride  3 mL Intravenous Q12H  . topiramate  100 mg Oral QHS   Continuous: . sodium chloride Stopped (12/12/13 1902)     Assessment/Plan:  Active Problems:   Hyponatremia  Hyponatremia in pt c DI and NVD viral illness c DeH - Improved c fluid restriction and NS IVF. She is much better so we will unhook her - increase her function and repeat labs at 2pm and if all good - OK for D.c  Hypothyroid - Synthroid dosing discussed.  TSH 0.043  CXR suggested bronchitis - No Cough or sputum.  Poor Dentition - recent mouth issues - finish Amoxil 500 TID.  Get teeth pulled and Dentures inserted.  DVT Prophylaxis  Leukopenia on MTX and Plaquenil  SLE - MTX and Plaquenil on hold for 1-2 more days.  Mild Chronic Anemia of Chronic Dz - follow.    LOS: 1 day   Lois Slagel M 12/13/2013,  8:52 AM

## 2013-12-13 NOTE — Discharge Summary (Signed)
Physician Discharge Summary  DISCHARGE SUMMARY   Patient ID: Stefanie [name] MR#: [id number] DOB/AGE: Jan 18, 1961 53 y.o.   Attending Physician:Stefanie [name]  Patient's WUJ:WJXBJ,YNWG M, MD  Consults: **  Admit date: 12/12/2013 Discharge date: 12/13/2013  Discharge Diagnoses:  Active Problems:   Hyponatremia   Patient Active Problem List   Diagnosis Date Noted  . Hyponatremia 12/12/2013  . Fever 12/13/2012  . Pleuritic chest pain 12/06/2012  . SLE (systemic lupus erythematosus) 12/06/2012  . DI (diabetes insipidus) 12/06/2012  . Anxiety 12/06/2012  . ANEMIA-IRON DEFICIENCY 01/08/2010  . GASTRIC DILATION, ACUTE 01/08/2010  . NAUSEA ALONE 01/08/2010  . ABDOMINAL PAIN, LEFT UPPER QUADRANT 01/08/2010   Past Medical History  Diagnosis Date  . Fibromyalgia   . Diabetes insipidus   . Lupus   . Mitral valve prolapse   . SLE (systemic lupus erythematosus) 12/06/2012    Discharged Condition: good   Discharge Medications:   Medication List    TAKE these medications       acetaminophen 500 MG tablet  Commonly known as:  TYLENOL  Take 1,000 mg by mouth every 6 (six) hours as needed for pain.     albuterol 108 (90 BASE) MCG/ACT inhaler  Commonly known as:  PROVENTIL HFA;VENTOLIN HFA  Inhale 1 puff into the lungs every 6 (six) hours as needed for wheezing or shortness of breath.     amoxicillin 500 MG capsule  Commonly known as:  AMOXIL  Take 500 mg by mouth 3 (three) times daily. For 10 days, starting 12/06/2013.     DDAVP RHINAL TUBE 0.01 % Soln  Generic drug:  desmopressin  Place 1 spray into the nose at bedtime.     folic acid 1 MG tablet  Commonly known as:  FOLVITE  Take 1 tablet (1 mg total) by mouth daily. Use when starts MTX     hydroxychloroquine 200 MG tablet  Commonly known as:  PLAQUENIL  Take 1 tablet (200 mg total) by mouth daily.     levothyroxine 150 MCG tablet  Commonly known as:  SYNTHROID, LEVOTHROID  Take 1 tablet (150 mcg total) by  mouth daily before breakfast. None on Wednesday     rizatriptan 10 MG tablet  Commonly known as:  MAXALT  Take 10 mg by mouth daily as needed for migraine. May repeat in 2 hours if needed     topiramate 100 MG tablet  Commonly known as:  TOPAMAX  Take 100 mg by mouth daily.     VITAMIN D PO  Take 1 capsule by mouth daily.      ASK your doctor about these medications       ibuprofen 200 MG tablet  Commonly known as:  ADVIL,MOTRIN  Take 400 mg by mouth every 6 (six) hours as needed for pain.        Hospital Procedures: Portable Chest 1 View  12/12/2013   CLINICAL DATA:  Initial evaluation of a 53 year old female patient with history of lupus presenting with hyponatremia.  EXAM: PORTABLE CHEST - 1 VIEW  COMPARISON:  Chest x-ray 12/13/2012.  FINDINGS: Mild diffuse coarsening of the interstitial markings and diffuse peribronchial cuffing. No confluent consolidative airspace disease. No pleural effusions. No evidence of pulmonary edema. Heart size is normal. Upper mediastinal contours are within normal limits. Mild bilateral apical nodular pleuroparenchymal thickening, similar to the prior examination, presumably chronic post infectious or inflammatory scarring.  IMPRESSION: 1. The appearance of the chest suggests bronchitis, as above.   Electronically Signed  By: Trudie Reed [name].D.   On: 12/12/2013 18:30   Mm Digital Screening Bilateral  11/28/2013   CLINICAL DATA:  Screening.  EXAM: DIGITAL SCREENING BILATERAL MAMMOGRAM WITH CAD  COMPARISON:  Previous exam(s).  ACR Breast Density Category b: There are scattered areas of fibroglandular density.  FINDINGS: There are no findings suspicious for malignancy. Images were processed with CAD.  IMPRESSION: No mammographic evidence of malignancy. A result letter of this screening mammogram will be mailed directly to the patient.  RECOMMENDATION: Screening mammogram in one year. (Code:SM-B-01Y)  BI-RADS CATEGORY  1: Negative.   Electronically Signed    By: Britta Mccreedy [name].D.   On: 11/28/2013 16:29    History of Present Illness: Admitted from my office c N/V/D/DeH/HA/ill/Weakness/AFTT d/t recurrent Hyponatremia - Na down to 117  Hospital Course: Admitted 10/6 c N/V/D - DeH and Hyponatremia.  Received IVF overnight and improved. HA improved. Na up to 132.  Nausea better but poor appetite.  Last loose stool was last night - 10/6   Hyponatremia in pt c DI and NVD viral illness c DeH - Improved c Free water fluid restriction and NS IVF.  She is much better OK to increase function to baseline.  She said she will be out of work till Monday Resume all home meds and ddAVP. Stay away from consuming too much free water  Hypothyroid - Synthroid dosing will stay the same. TSH 0.043  CXR suggested bronchitis - No Cough or sputum. No increase Abx over Amoxil - for mouth - needed Poor Dentition - recent mouth issues - finish Amoxil 500 TID. Get teeth pulled and Dentures inserted.  DVT Prophylaxis was provided Leukopenia on MTX (still has not started it yet - when she does she will take Folic acid with it) and Plaquenil (on hold while ill and she stopped it when started the Abx) SLE - MTX and Plaquenil on hold for a few more days.  Mild Chronic Anemia of Chronic Dz - follow.   Ready for d/c in the afternoon of 10/7 Repeat Na came back 135    Day of Discharge Exam BP 97/66  Pulse 71  Temp(Src) 98 F (36.7 C) (Oral)  Resp 18  Ht 5\' 8"  (1.727 [name])  Wt 60.737 kg (133 lb 14.4 oz)  BMI 20.36 kg/m2  SpO2 100%  Physical Exam: See PN   Discharge Labs:  Recent Labs  12/12/13 2001 12/13/13 0405 12/13/13 1345  NA 119* 132* 135*  K 3.9 4.0 4.2  CL 84* 99 102  CO2 22 23 19   GLUCOSE 83 83 113*  BUN 8 11 11   CREATININE 0.53 0.67 0.68  CALCIUM 9.1 9.1 9.1  MG 2.1  --   --   PHOS 3.2  --   --     Recent Labs  12/12/13 2001  AST 23  ALT 15  ALKPHOS 121*  BILITOT 0.4  PROT 8.0  ALBUMIN 3.5    Recent Labs  12/12/13 2001  WBC  3.5*  HGB 10.9*  HCT 31.2*  MCV 77.4*  PLT 286   No results found for this basename: CKTOTAL, CKMB, CKMBINDEX, TROPONINI,  in the last 72 hours  Recent Labs  12/12/13 2000  TSH 0.043*   No results found for this basename: VITAMINB12, FOLATE, FERRITIN, TIBC, IRON, RETICCTPCT,  in the last 72 hours Lab Results  Component Value Date   INR 0.9 04/06/2008       Discharge instructions:  01-Home or Self Care Follow-up Information  Follow up with Gwen PoundsUSSO,Alphonsine Minium M, MD In 1 week. (BMET)    Specialty:  Internal Medicine   Contact information:   [address] [place] [address] [phone]        Disposition: home  Follow-up Appts: [facility].  Call for appointment.  Condition on Discharge: stable.  Much better  Tests Needing Follow-up: BMET/CBC  Time spent in discharge (includes decision making & examination of pt): 30 min  Signed: Lashandra Arauz [name] 12/13/2013, 2:40 PM

## 2014-04-14 ENCOUNTER — Encounter: Payer: Self-pay | Admitting: Internal Medicine

## 2014-06-19 ENCOUNTER — Encounter: Payer: Self-pay | Admitting: Internal Medicine

## 2014-08-16 ENCOUNTER — Encounter (HOSPITAL_COMMUNITY): Payer: Self-pay | Admitting: *Deleted

## 2014-08-16 ENCOUNTER — Emergency Department (HOSPITAL_COMMUNITY)
Admission: EM | Admit: 2014-08-16 | Discharge: 2014-08-16 | Disposition: A | Discharge status: Home / Self Care | Payer: Managed Care, Other (non HMO) | Attending: Emergency Medicine | Admitting: Emergency Medicine

## 2014-08-16 ENCOUNTER — Emergency Department (HOSPITAL_COMMUNITY): Payer: Managed Care, Other (non HMO)

## 2014-08-16 DIAGNOSIS — J45909 Unspecified asthma, uncomplicated: Secondary | ICD-10-CM | POA: Insufficient documentation

## 2014-08-16 DIAGNOSIS — Z8679 Personal history of other diseases of the circulatory system: Secondary | ICD-10-CM | POA: Insufficient documentation

## 2014-08-16 DIAGNOSIS — R000 Tachycardia, unspecified: Secondary | ICD-10-CM | POA: Diagnosis not present

## 2014-08-16 DIAGNOSIS — Z8739 Personal history of other diseases of the musculoskeletal system and connective tissue: Secondary | ICD-10-CM | POA: Insufficient documentation

## 2014-08-16 DIAGNOSIS — Z872 Personal history of diseases of the skin and subcutaneous tissue: Secondary | ICD-10-CM | POA: Diagnosis not present

## 2014-08-16 DIAGNOSIS — J159 Unspecified bacterial pneumonia: Secondary | ICD-10-CM | POA: Insufficient documentation

## 2014-08-16 DIAGNOSIS — R079 Chest pain, unspecified: Secondary | ICD-10-CM | POA: Diagnosis present

## 2014-08-16 DIAGNOSIS — Z79899 Other long term (current) drug therapy: Secondary | ICD-10-CM | POA: Diagnosis not present

## 2014-08-16 DIAGNOSIS — R110 Nausea: Secondary | ICD-10-CM | POA: Insufficient documentation

## 2014-08-16 DIAGNOSIS — E232 Diabetes insipidus: Secondary | ICD-10-CM | POA: Diagnosis not present

## 2014-08-16 DIAGNOSIS — J189 Pneumonia, unspecified organism: Secondary | ICD-10-CM

## 2014-08-16 DIAGNOSIS — F419 Anxiety disorder, unspecified: Secondary | ICD-10-CM | POA: Insufficient documentation

## 2014-08-16 HISTORY — DX: Unspecified asthma, uncomplicated: J45.909

## 2014-08-16 LAB — BASIC METABOLIC PANEL
Anion gap: 8 (ref 5–15)
BUN: 10 mg/dL (ref 6–20)
CHLORIDE: 99 mmol/L — AB (ref 101–111)
CO2: 25 mmol/L (ref 22–32)
Calcium: 9 mg/dL (ref 8.9–10.3)
Creatinine, Ser: 0.72 mg/dL (ref 0.44–1.00)
GFR calc non Af Amer: >60 mL/min (ref >60)
Glucose, Bld: 90 mg/dL (ref 65–99)
POTASSIUM: 3.8 mmol/L (ref 3.5–5.1)
SODIUM: 132 mmol/L — AB (ref 135–145)

## 2014-08-16 LAB — CBC
HEMATOCRIT: 36.5 % (ref 36.0–46.0)
HEMOGLOBIN: 12 g/dL (ref 12.0–15.0)
MCH: 28 pg (ref 26.0–34.0)
MCHC: 32.9 g/dL (ref 30.0–36.0)
MCV: 85.1 fL (ref 78.0–100.0)
PLATELETS: 280 10*3/uL (ref 150–400)
RBC: 4.29 MIL/uL (ref 3.87–5.11)
RDW: 14.5 % (ref 11.5–15.5)
WBC: 8.2 10*3/uL (ref 4.0–10.5)

## 2014-08-16 LAB — D-DIMER, QUANTITATIVE: D-Dimer, Quant: 0.76 ug/mL-FEU — ABNORMAL HIGH (ref 0.00–0.48)

## 2014-08-16 LAB — I-STAT TROPONIN, ED: Troponin i, poc: 0 ng/mL (ref 0.00–0.08)

## 2014-08-16 MED ORDER — ONDANSETRON HCL 4 MG PO TABS: 4.0000 mg | ORAL_TABLET | Freq: Four times a day (QID) | ORAL | Status: DC

## 2014-08-16 MED ORDER — DOXYCYCLINE HYCLATE 100 MG PO CAPS
100.0000 mg | ORAL_CAPSULE | Freq: Two times a day (BID) | ORAL | Status: DC
Start: 2014-08-16 — End: 2014-10-22

## 2014-08-16 MED ORDER — IOHEXOL 350 MG/ML SOLN
80.0000 mL | Freq: Once | INTRAVENOUS | Status: AC | PRN
  Administered 2014-08-16: 80 mL via INTRAVENOUS

## 2014-08-16 MED ORDER — FENTANYL CITRATE (PF) 100 MCG/2ML IJ SOLN
12.5000 ug | Freq: Once | INTRAMUSCULAR | Status: AC
  Administered 2014-08-16: 12.5 ug via INTRAVENOUS
  Filled 2014-08-16: qty 2

## 2014-08-16 MED ORDER — HYDROCODONE-ACETAMINOPHEN 5-325 MG PO TABS: 1.0000 | ORAL_TABLET | Freq: Four times a day (QID) | ORAL | Status: DC | PRN

## 2014-08-16 MED ORDER — FENTANYL CITRATE (PF) 100 MCG/2ML IJ SOLN
25.0000 ug | Freq: Once | INTRAMUSCULAR | Status: AC
  Administered 2014-08-16: 25 ug via INTRAVENOUS
  Filled 2014-08-16: qty 2

## 2014-08-16 MED ORDER — KETOROLAC TROMETHAMINE 30 MG/ML IJ SOLN
30.0000 mg | Freq: Once | INTRAMUSCULAR | Status: AC
  Administered 2014-08-16: 30 mg via INTRAVENOUS
  Filled 2014-08-16: qty 1

## 2014-08-16 MED ORDER — ONDANSETRON HCL 4 MG/2ML IJ SOLN
4.0000 mg | Freq: Once | INTRAMUSCULAR | Status: AC
  Administered 2014-08-16: 4 mg via INTRAVENOUS
  Filled 2014-08-16: qty 2

## 2014-08-16 NOTE — ED Notes (Addendum)
PT c/o acute onset chest tightness since last night and sob.  States that if she breaths it feels as if someone is sitting on her chest.

## 2014-08-16 NOTE — ED Provider Notes (Addendum)
CSN: [id number]     Arrival date & time 08/16/14  0847 History   First MD Initiated Contact with Patient 08/16/14 [id number]     Chief Complaint  Patient presents with  . Chest Pain     (Consider location/radiation/quality/duration/timing/severity/associated sxs/prior Treatment) Patient is a 54 y.o. female presenting with chest pain. The history is provided by the patient.  Chest Pain Pain location:  L chest Pain quality: sharp, stabbing and tightness   Pain radiates to:  Upper back Pain radiates to the back: yes   Pain severity:  Severe Onset quality:  Gradual Duration:  5 hours Timing:  Constant Progression:  Worsening Chronicity:  New Context: breathing   Context comment:  Went to bed last night without any problems and woke up this morning with pain in her chest and shortness of breath Relieved by:  Nothing Worsened by:  Coughing and deep breathing Ineffective treatments: Tried using her inhaler without improvement. Associated symptoms: cough, fever, nausea and shortness of breath   Associated symptoms: no abdominal pain, no anorexia, no lower extremity edema, no palpitations, not vomiting and no weakness   Associated symptoms comment:  For the last 2 weeks she has had URI sx and completed a 2 week course of cefdinir and sx restarted last week. Risk factors: no coronary artery disease, no diabetes mellitus, no hypertension, not obese, no prior DVT/PE and no smoking   Risk factors comment:  Family history of heart disease, lupus and diabetes insipidus   Past Medical History  Diagnosis Date  . Fibromyalgia   . Diabetes insipidus   . Lupus   . Mitral valve prolapse   . SLE (systemic lupus erythematosus) 12/06/2012  . Asthma    Past Surgical History  Procedure Laterality Date  . No past surgeries     No family history on file. History  Substance Use Topics  . Smoking status: Never Smoker   . Smokeless tobacco: Never Used  . Alcohol Use: No   OB History    No data  available     Review of Systems  Constitutional: Positive for fever.  Respiratory: Positive for cough and shortness of breath.   Cardiovascular: Positive for chest pain. Negative for palpitations.  Gastrointestinal: Positive for nausea. Negative for vomiting, abdominal pain and anorexia.  Neurological: Negative for weakness.  All other systems reviewed and are negative.     Allergies  Venofer and Azithromycin  Home Medications   Prior to Admission medications   Medication Sig Start Date End Date Taking? Authorizing Provider  albuterol (PROVENTIL HFA;VENTOLIN HFA) 108 (90 BASE) MCG/ACT inhaler Inhale 1 puff into the lungs every 6 (six) hours as needed for wheezing or shortness of breath.   Yes Historical Provider, MD  Cholecalciferol (VITAMIN D PO) Take 1 capsule by mouth daily.   Yes Historical Provider, MD  desmopressin (DDAVP RHINAL TUBE) 0.01 % SOLN Place 1 spray into the nose every other day. At bedtime   Yes Historical Provider, MD  ibuprofen (ADVIL,MOTRIN) 200 MG tablet Take 400 mg by mouth every 6 (six) hours as needed for pain.   Yes Historical Provider, MD  levothyroxine (SYNTHROID, LEVOTHROID) 125 MCG tablet Take 125 mcg by mouth at bedtime.  06/14/14  Yes Historical Provider, MD  rizatriptan (MAXALT) 10 MG tablet Take 10 mg by mouth daily as needed for migraine. May repeat in 2 hours if needed   Yes Historical Provider, MD   BP 117/87 mmHg  Pulse 113  Temp(Src) 98.4 F (36.9 C) (Oral)  Resp 18  Ht 5\' 8"  (1.727 m)  Wt 135 lb (61.236 kg)  BMI 20.53 kg/m2 Physical Exam  Constitutional: She is oriented to person, place, and time. She appears well-developed and well-nourished. She appears distressed.  Anxious and uncomfortable appearing female  HENT:  Head: Normocephalic and atraumatic.  Mouth/Throat: Oropharynx is clear and moist.  Eyes: Conjunctivae and EOM are normal. Pupils are equal, round, and reactive to light.  Neck: Normal range of motion. Neck supple.    Cardiovascular: Regular rhythm and intact distal pulses.  Tachycardia present.   No murmur heard. Pulmonary/Chest: Effort normal and breath sounds normal. No respiratory distress. She has no wheezes. She has no rales. She exhibits tenderness.    Abdominal: Soft. She exhibits no distension. There is no tenderness. There is no rebound and no guarding.  Musculoskeletal: Normal range of motion. She exhibits no edema or tenderness.  Neurological: She is alert and oriented to person, place, and time.  Skin: Skin is warm and dry. No rash noted. No erythema.  Psychiatric: She has a normal mood and affect. Her behavior is normal.  Nursing note and vitals reviewed.   ED Course  Procedures (including critical care time) Labs Review Labs Reviewed  BASIC METABOLIC PANEL - Abnormal; Notable for the following:    Sodium 132 (*)    Chloride 99 (*)    All other components within normal limits  D-DIMER, QUANTITATIVE (NOT AT [facility]) - Abnormal; Notable for the following:    D-Dimer, Quant 0.76 (*)    All other components within normal limits  CBC  I-STAT TROPOININ, ED  Rosezena Sensor, ED    Imaging Review Dg Chest 2 View  08/16/2014   CLINICAL DATA:  Chest pain and shortness of breath.  EXAM: CHEST  2 VIEW  COMPARISON:  12/12/2013 and 12/13/2012  FINDINGS: Heart size and pulmonary vascularity are normal. There is chronic peribronchial thickening with biapical pleural thickening. On the lateral view there appears to be a 13 mm nodule anteriorly. This is not seen on multiple prior chest x-rays as well as a chest CT dated 12/06/2012.  IMPRESSION: Possible 13 mm nodule seen only on the lateral view, either in the right middle lobe or lingula. CT scan may be useful for further evaluation. Chronic bronchitic changes.   Electronically Signed   By: Francene Boyers M.D.   On: 08/16/2014 10:07   Ct Angio Chest Pe W/cm &/or Wo Cm  08/16/2014   CLINICAL DATA:  Shortness breath and chest pain, chest heaviness for 1  week. History of lupus and asthma.  EXAM: CT ANGIOGRAPHY CHEST WITH CONTRAST  TECHNIQUE: Multidetector CT imaging of the chest was performed using the standard protocol during bolus administration of intravenous contrast. Multiplanar CT image reconstructions and MIPs were obtained to evaluate the vascular anatomy.  CONTRAST:  16mL OMNIPAQUE IOHEXOL 350 MG/ML SOLN  COMPARISON:  12/06/2012  FINDINGS: Pulmonary arteries are well-visualized and appear patent. No significant filling defect or pulmonary embolus demonstrated by CTA. Normal heart size. Small pericardial effusion noted. No pleural effusion evident. 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 No adenopathy. Included upper abdomen demonstrates no acute process.  Lung windows demonstrate chronic biapical pleural parenchymal scarring. Scattered bilateral pulmonary blebs. Trachea and central airways are patent. Somewhat nodular right middle lobe bronchovascular opacities present could represent infectious/inflammatory process such as pneumonia. This is new compared to prior exam. Very minor surrounding tree-in-bud opacities in the right middle lobe as well.  No acute osseous finding.  Review of the MIP images confirms the above findings.  IMPRESSION: No significant acute pulmonary embolus by CTA.  Very small pericardial effusion.  Right middle lobe nodular consolidative bronchovascular opacities with adjacent fine tree-in-bud opacities, infectious/inflammatory process such as pneumonia is favored. This correlates with the chest x-ray finding. Recommend short-term follow-up at 4 weeks following treatment with a repeat two view chest exam.  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   Electronically Signed   By: Judie Petit.  Shick M.D.   On: 08/16/2014 12:46     EKG Interpretation   Date/Time:  Thursday August 16 2014 08:52:13 EDT Ventricular Rate:  112 PR  Interval:  178 QRS Duration: 64 QT Interval:  304 QTC Calculation: 414 R Axis:   98 Text Interpretation:  Sinus tachycardia Rightward axis Septal infarct ,  age undetermined Artifact No significant change since last tracing  Confirmed by Anitra Lauth  MD, Alphonzo Lemmings ([id number]) on 08/16/2014 9:12:28 AM      MDM   Final diagnoses:  Chest pain  CAP (community acquired pneumonia)    Patient presenting with pleuritic chest pain that's now been ongoing for the last 5-1/2 hours with associated shortness of breath. Patient woke up at 4 AM this morning with a tight sharp pain in her chest and shortness of breath. This is only worsened over last 5-1/2 hours. She attempted to use her inhaler without improvement. Here she appears very uncomfortable, slightly anxious and tachypnea. She is complaining of left-sided chest wall tenderness. She is tachycardic but satting 100% on room air.  She has a family history of cardiac problems she herself has never had any heart problems. She also has a history of lupus but has never had a clot in the past. She denies any recent surgeries but a proximally 2 weeks ago had a URI.  Low suspicion that this is cardiac in nature. EKG is unchanged from prior. No evidence of pericarditis on EKG. Lower suspicion for pneumothorax as patient has clear breath sounds bilaterally. Concern for PE versus pleurisy/PNA given recent URI. She is currently not wheezing at this time feeling asthma would be unlikely.  Patient given pain control. CBC, BMP, d-dimer, troponin, chest x-ray pending  10:27 AM Labs and x-ray are within normal limits except for an elevated d-dimer and small area on CXR. Will do a CTA of the chest for further evaluation. On repeat evaluation patient is still having pain. Will give an additional dose of fentanyl and Toradol.  1:27 PM CTA negative for PE however evidence of pneumonia. Given patient's symptoms pneumonia is likely with associated pain. Patient's pain is improved  on repeat exam. So she recently had a two-week course of cephalosporin will treat with doxycycline. Patient will follow-up with her PCP early next week.  Gwyneth Sprout, MD 08/16/14 1327  Gwyneth Sprout, MD 08/16/14 1329

## 2014-08-16 NOTE — ED Notes (Signed)
PT placed in gown and in bed. Pt monitored by pulse ox, bp cuff, and 5-lead. 

## 2014-08-21 ENCOUNTER — Encounter: Payer: Managed Care, Other (non HMO) | Admitting: Internal Medicine

## 2014-10-17 ENCOUNTER — Encounter: Payer: Self-pay | Admitting: Internal Medicine

## 2014-10-22 ENCOUNTER — Ambulatory Visit (INDEPENDENT_AMBULATORY_CARE_PROVIDER_SITE_OTHER): Payer: Worker's Compensation | Admitting: Family Medicine

## 2014-10-22 ENCOUNTER — Ambulatory Visit: Payer: Self-pay

## 2014-10-22 VITALS — BP 128/78 | HR 113 | Temp 99.1°F | Resp 18 | Ht 66.0 in | Wt 140.4 lb

## 2014-10-22 DIAGNOSIS — M25572 Pain in left ankle and joints of left foot: Secondary | ICD-10-CM | POA: Diagnosis not present

## 2014-10-22 DIAGNOSIS — M545 Low back pain, unspecified: Secondary | ICD-10-CM

## 2014-10-22 DIAGNOSIS — W19XXXA Unspecified fall, initial encounter: Secondary | ICD-10-CM

## 2014-10-22 DIAGNOSIS — M25551 Pain in right hip: Secondary | ICD-10-CM

## 2014-10-22 MED ORDER — MELOXICAM 15 MG PO TABS: 15.0000 mg | ORAL_TABLET | Freq: Every day | ORAL | Status: DC

## 2014-10-22 MED ORDER — TRAMADOL HCL 50 MG PO TABS: 50.0000 mg | ORAL_TABLET | Freq: Three times a day (TID) | ORAL | Status: DC | PRN

## 2014-10-22 MED ORDER — CYCLOBENZAPRINE HCL 10 MG PO TABS: 10.0000 mg | ORAL_TABLET | Freq: Three times a day (TID) | ORAL | Status: DC | PRN

## 2014-10-22 NOTE — Progress Notes (Signed)
Subjective:    Patient ID: Stefanie [name], female    DOB: 02-Jan-1961, 54 y.o.   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 Fell backward and twisted ankle and landed on right arm. Arm is painful, but not like the other areas. Ankle pain is 10/10 and has been red and swollen. Pain does not radiate. Pain is worse with walking. Was unable to stand up on her own after fall. Right hip is tender 5/10 pain that does not radiate. Lower back is sore as well and pain is 7/10 that does not radiate. Denies numbness, tingling, loss of sensation/fxn. ROM limited secondary to pain. Did not hit head. Med allergy to venofer and azithromycin.   Review of Systems  Constitutional: Negative.   Genitourinary: Negative for dysuria, enuresis and pelvic pain.  Musculoskeletal: Positive for myalgias (baseline secondary to lupus), back pain, joint swelling, arthralgias and gait problem. Negative for neck pain and neck stiffness.  Skin: Positive for color change (red over ankle) and rash (secondary to lupus).  Neurological: Negative for dizziness, weakness, numbness and headaches.       Objective:   Physical Exam  Constitutional: She is oriented to person, place, and time. She appears well-developed and well-nourished. No distress.  Blood pressure 128/78, pulse 113, temperature 99.1 F (37.3 C), temperature source Oral, resp. rate 18, height  (1.676 m), weight 140 lb 6.4 oz (63.685 kg), SpO2 99 %.   HENT:  Head: Normocephalic and atraumatic.  Right Ear: External ear normal.  Left Ear: External ear normal.  Eyes: Conjunctivae are normal. Right eye exhibits no discharge. Left eye exhibits no discharge. No scleral icterus.  Pulmonary/Chest: Effort normal.  Musculoskeletal:       Right hip: She exhibits tenderness. She exhibits normal range of motion, normal strength, no bony tenderness,  no swelling, no crepitus, no deformity and no laceration.       Left hip: Normal.       Right ankle: Normal.       Left ankle: She exhibits swelling. She exhibits normal range of motion, no ecchymosis, no deformity, no laceration and normal pulse. Tenderness. Lateral malleolus tenderness found. No medial malleolus, no AITFL, no CF ligament, no posterior TFL, no head of 5th metatarsal and no proximal fibula tenderness found. Achilles tendon normal.       Lumbar back: She exhibits tenderness and pain. She exhibits normal range of motion, no bony tenderness, no swelling, no edema, no deformity, no laceration and no spasm.       Right hand: Normal.       Left hand: Normal.       Left foot: Normal.  Neurological: She is alert and oriented to person, place, and time. She has normal strength and normal reflexes. No cranial nerve deficit or sensory deficit.  Skin: Skin is warm and dry. She is not diaphoretic. Erythema: over lateral malleolus of left ankle.  Psychiatric: She has a normal mood and affect. Her behavior is normal. Judgment and thought content normal.   [facility] reading (PRIMARY) by  Dr. Alwyn Ren. Acute bony abnormalities. No fracture observed in the back, hip, or ankle. Degenerative changes noted in the hip and back.      Assessment & Plan:  1. Fall, initial encounter - DG Ankle Complete Left; Future - DG HIP UNILAT W OR W/O PELVIS 2-3 VIEWS RIGHT; Future -  DG Lumbar Spine 2-3 Views; Future  2. Acute ankle pain, left 3. Hip pain, acute, right 4. Midline low back pain without sciatica Ankle wrapped. Should modify work to sitting more as opposed to standing and walking around. Elevate and Ice ankle. RTC 10/29/14 for re-evaluation. - cyclobenzaprine (FLEXERIL) 10 MG tablet; Take 1 tablet (10 mg total) by mouth 3 (three) times daily as needed for muscle spasms.  Dispense: 30 tablet; Refill: 0 - traMADol (ULTRAM) 50 MG tablet; Take 1 tablet (50 mg total) by mouth every 8 (eight) hours as needed.   Dispense: 30 tablet; Refill: 0 - meloxicam (MOBIC) 15 MG tablet; Take 1 tablet (15 mg total) by mouth daily.  Dispense: 30 tablet; Refill: 1   Brantly Kalman PA-C  [facility] 10/23/2014 9:26 AM

## 2014-10-22 NOTE — Patient Instructions (Signed)

## 2014-10-23 ENCOUNTER — Encounter: Payer: Self-pay | Admitting: Physician Assistant

## 2014-10-29 ENCOUNTER — Ambulatory Visit (INDEPENDENT_AMBULATORY_CARE_PROVIDER_SITE_OTHER): Payer: Worker's Compensation | Admitting: Family Medicine

## 2014-10-29 VITALS — BP 118/76 | HR 94 | Temp 98.5°F | Resp 18 | Ht 69.0 in | Wt 143.0 lb

## 2014-10-29 DIAGNOSIS — S93402D Sprain of unspecified ligament of left ankle, subsequent encounter: Secondary | ICD-10-CM | POA: Diagnosis not present

## 2014-10-29 NOTE — Patient Instructions (Signed)
Your ankle sprain seems to be a bit better We will arrange for an ultrasound to make sure there is no clot in your calf Continue to use an ace wrap on your ankle as desired, and try to ice and elevate you ankle when you can Let's check back in 2 weeks- Sooner if worse.

## 2014-10-29 NOTE — Progress Notes (Signed)
Stefanie [name] 14-Jan-1961 54 y.o.   Chief Complaint  Patient presents with  . Follow-up    left ankle      Date of Injury: 10/22/2014  History of Present Illness:  Presents for evaluation of work-related complaint. Here today to recheck a WC injury.  She fell and sustained a left ankle sprain, right hip and low back contusion.   She had been wearing an ace wrap on her left ankle but it seems to have bothered the ankle.  She does have to be careful with her ankle; it hurts but she can walk.  It is still swollen but improving.   She also notes a "knot" in the back of the left calf that has been present for about 3 days She does not have a history of DVT or PE, but she does have lupus.  No SOB  ROS    Allergies  Allergen Reactions  . Venofer [Ferric Oxide] Palpitations    Dizziness, sweating, "felt like I was going to pass out" unknown when episode happened  . Azithromycin Other (See Comments)    "couldnt sleep"     Current medications reviewed and updated. Past medical history, family history, social history have been reviewed and updated.   Physical Exam   GEN: WDWN, NAD, Non-toxic, Alert & Oriented x 3 HEENT: Atraumatic, Normocephalic.  Ears and Nose: No external deformity. EXTR: No clubbing/cyanosis/edema NEURO: Normal gait.  PSYCH: Normally interactive. Conversant. Not depressed or anxious appearing.  Calm demeanor.  Left ankle: the lateral ankle is tender and swollen, with mild bruising along the foot.  She states that the swelling is better.  Achilles is intact, the rest of the foot is non- tender.  She also has a small tender "knot" in the posterior mid left calf  Assessment and Plan: Left ankle sprain, subsequent encounter - Plan: Ultrasound doppler venous legs bilat  Improving ankle sprain.  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

## 2014-11-08 NOTE — Addendum Note (Signed)
Addended by: Johnnette Litter on: 11/08/2014 12:05 PM   Modules accepted: Orders

## 2014-11-14 ENCOUNTER — Telehealth: Payer: Self-pay | Admitting: Family Medicine

## 2014-11-14 NOTE — Telephone Encounter (Signed)
Received her LE doppler report- negative.  Called and gave her these results She will see [place] later on this week for a recheck

## 2014-12-06 ENCOUNTER — Encounter: Payer: Self-pay | Admitting: Family Medicine

## 2014-12-26 ENCOUNTER — Ambulatory Visit: Payer: Self-pay | Admitting: Family Medicine

## 2014-12-26 ENCOUNTER — Telehealth: Payer: Self-pay

## 2014-12-26 NOTE — Telephone Encounter (Signed)
Tried to call patient to advise of missed appointment with Dr. Patsy Lageropland but there was no answer no machine.  I tried to call her cell phone but no VM was set up.

## 2015-12-18 ENCOUNTER — Ambulatory Visit (INDEPENDENT_AMBULATORY_CARE_PROVIDER_SITE_OTHER): Payer: Managed Care, Other (non HMO) | Admitting: Rheumatology

## 2015-12-18 DIAGNOSIS — Z79899 Other long term (current) drug therapy: Secondary | ICD-10-CM

## 2015-12-18 DIAGNOSIS — M19041 Primary osteoarthritis, right hand: Secondary | ICD-10-CM | POA: Diagnosis not present

## 2015-12-18 DIAGNOSIS — M797 Fibromyalgia: Secondary | ICD-10-CM | POA: Diagnosis not present

## 2015-12-18 DIAGNOSIS — M170 Bilateral primary osteoarthritis of knee: Secondary | ICD-10-CM

## 2015-12-18 DIAGNOSIS — M7071 Other bursitis of hip, right hip: Secondary | ICD-10-CM

## 2015-12-18 DIAGNOSIS — M1120 Other chondrocalcinosis, unspecified site: Secondary | ICD-10-CM

## 2015-12-18 DIAGNOSIS — R5381 Other malaise: Secondary | ICD-10-CM

## 2016-01-20 ENCOUNTER — Other Ambulatory Visit: Payer: Self-pay | Admitting: Internal Medicine

## 2016-01-20 DIAGNOSIS — Z1231 Encounter for screening mammogram for malignant neoplasm of breast: Secondary | ICD-10-CM

## 2016-02-06 ENCOUNTER — Other Ambulatory Visit: Payer: Self-pay | Admitting: Rheumatology

## 2016-02-06 LAB — COMPLETE METABOLIC PANEL WITH GFR
ALT: 13 U/L (ref 6–29)
AST: 18 U/L (ref 10–35)
Albumin: 3.9 g/dL (ref 3.6–5.1)
Alkaline Phosphatase: 86 U/L (ref 33–130)
BUN: 13 mg/dL (ref 7–25)
CO2: 27 mmol/L (ref 20–31)
CREATININE: 0.7 mg/dL (ref 0.50–1.05)
Calcium: 9.2 mg/dL (ref 8.6–10.4)
Chloride: 98 mmol/L (ref 98–110)
GFR, Est African American: >89 mL/min (ref >=60)
GFR, Est Non African American: >89 mL/min (ref >=60)
GLUCOSE: 85 mg/dL (ref 65–99)
Potassium: 4.2 mmol/L (ref 3.5–5.3)
Sodium: 132 mmol/L — ABNORMAL LOW (ref 135–146)
Total Bilirubin: 0.5 mg/dL (ref 0.2–1.2)
Total Protein: 7.3 g/dL (ref 6.1–8.1)

## 2016-02-06 LAB — CBC WITH DIFFERENTIAL/PLATELET
Basophils Absolute: 0 cells/uL (ref 0–200)
Basophils Relative: 0 %
EOS ABS: 39 {cells}/uL (ref 15–500)
Eosinophils Relative: 1 %
HEMATOCRIT: 37 % (ref 35.0–45.0)
Hemoglobin: 12.2 g/dL (ref 11.7–15.5)
LYMPHS PCT: 30 %
Lymphs Abs: 1170 cells/uL (ref 850–3900)
MCH: 28 pg (ref 27.0–33.0)
MCHC: 33 g/dL (ref 32.0–36.0)
MCV: 85.1 fL (ref 80.0–100.0)
MPV: 10.2 fL (ref 7.5–12.5)
Monocytes Absolute: 507 cells/uL (ref 200–950)
Monocytes Relative: 13 %
Neutro Abs: 2184 cells/uL (ref 1500–7800)
Neutrophils Relative %: 56 %
Platelets: 247 10*3/uL (ref 140–400)
RBC: 4.35 MIL/uL (ref 3.80–5.10)
RDW: 13.9 % (ref 11.0–15.0)
WBC: 3.9 10*3/uL (ref 3.8–10.8)

## 2016-02-07 ENCOUNTER — Telehealth: Payer: Self-pay | Admitting: Rheumatology

## 2016-02-07 NOTE — Telephone Encounter (Signed)
Patient called to see if the lab work had been received from [facility]. Called as an fyi that it should be coming

## 2016-02-07 NOTE — Progress Notes (Signed)
Normal

## 2016-02-12 ENCOUNTER — Ambulatory Visit
Admission: RE | Admit: 2016-02-12 | Discharge: 2016-02-12 | Disposition: A | Discharge status: Home / Self Care | Payer: Managed Care, Other (non HMO) | Source: Ambulatory Visit | Attending: Internal Medicine | Admitting: Internal Medicine

## 2016-02-12 ENCOUNTER — Encounter: Payer: Self-pay | Admitting: Podiatry

## 2016-02-12 ENCOUNTER — Ambulatory Visit (INDEPENDENT_AMBULATORY_CARE_PROVIDER_SITE_OTHER): Payer: Managed Care, Other (non HMO)

## 2016-02-12 ENCOUNTER — Ambulatory Visit (INDEPENDENT_AMBULATORY_CARE_PROVIDER_SITE_OTHER): Payer: Managed Care, Other (non HMO) | Admitting: Podiatry

## 2016-02-12 DIAGNOSIS — L600 Ingrowing nail: Secondary | ICD-10-CM | POA: Diagnosis not present

## 2016-02-12 DIAGNOSIS — M79672 Pain in left foot: Secondary | ICD-10-CM

## 2016-02-12 DIAGNOSIS — M79671 Pain in right foot: Secondary | ICD-10-CM

## 2016-02-12 DIAGNOSIS — M21619 Bunion of unspecified foot: Secondary | ICD-10-CM

## 2016-02-12 DIAGNOSIS — Z1231 Encounter for screening mammogram for malignant neoplasm of breast: Secondary | ICD-10-CM

## 2016-02-12 NOTE — Progress Notes (Signed)
   Subjective:    Patient ID: Stefanie [name], female    DOB: [birth year], 55 y.o.   MRN: [id number]  HPI  Chief Complaint  Patient presents with  . Nail Problem    Right foot; great toe; nail discoloration & thickened; x3 yrs       Review of Systems  Cardiovascular: Positive for chest pain and leg swelling.  Musculoskeletal: Positive for myalgias.  Skin: Positive for rash.  Neurological: Positive for headaches.  All other systems reviewed and are negative.      Objective:   Physical Exam        Assessment & Plan:

## 2016-02-12 NOTE — Patient Instructions (Signed)

## 2016-02-13 ENCOUNTER — Other Ambulatory Visit: Payer: Self-pay | Admitting: Internal Medicine

## 2016-02-13 ENCOUNTER — Telehealth: Payer: Self-pay | Admitting: Rheumatology

## 2016-02-13 DIAGNOSIS — R928 Other abnormal and inconclusive findings on diagnostic imaging of breast: Secondary | ICD-10-CM

## 2016-02-13 NOTE — Telephone Encounter (Signed)
Patient calling for her lab results. [phone]

## 2016-02-13 NOTE — Telephone Encounter (Signed)
Patient advised lab results are normal 

## 2016-02-13 NOTE — Progress Notes (Signed)
Subjective:     Patient ID: Stefanie [name], female   DOB: Aug 23, 1960, 55 y.o.   MRN: [id number]  HPI patient presents stating she has a severely damaged right hallux nail that's thick and has been this way for several years and becomes painful and also has structural bunion deformity bilateral which is gradually getting worse   Review of Systems  All other systems reviewed and are negative.      Objective:   Physical Exam  Constitutional: She is oriented to person, place, and time.  Cardiovascular: Intact distal pulses.   Musculoskeletal: Normal range of motion.  Neurological: She is oriented to person, place, and time.  Skin: Skin is warm.  Nursing note and vitals reviewed.  neurovascular status intact muscle strength adequate range motion within normal limits with severely thickened damaged right hallux nail that's loose from the bed and hyperostosis medial aspect first metatarsal head right over left. Patient's found have good digital perfusion and is well oriented 3     Assessment:     Damage right hallux nail chronic in nature and structural bunion deformity bilateral    Plan:     H&P conditions reviewed and recommended nail removal explaining procedure and risk. Patient wants surgery and today I infiltrated 60 Milligan times like Marcaine mixture remove the hallux nail exposed matrix by 5 phenol 5 applications 30 seconds followed by alcohol lavage and sterile dressing and instructed on soaks and reappoint to recheck. Do not recommend treatment of bunions which I explained to patient  X-ray report indicates mild elevation of the intermetatarsal angle with no indication of pathology

## 2016-02-17 ENCOUNTER — Telehealth: Payer: Self-pay | Admitting: *Deleted

## 2016-02-17 NOTE — Telephone Encounter (Signed)
Pt states she doesn't like the redness and look of the toe. I spoke with and informed that the toe for a least the next 4-6 weeks is going to be to some varying degree red, tender and oozing, to continue antibacterial soap soaks if she prefers, but be certain to rinse well, may change to 1/2 C epsom salt in 1 qt warm water, which is more drying and perform for 4-6 weeks until the toe gets an dry hard scab, may allow to air dry after 1 of the daily soaks if resting, or going to bed, otherwise needs to have the toe covered with antibiotic ointment dressing, and to report any signs of infection. Pt states understanding.

## 2016-02-21 ENCOUNTER — Ambulatory Visit
Admission: RE | Admit: 2016-02-21 | Discharge: 2016-02-21 | Disposition: A | Discharge status: Home / Self Care | Payer: Managed Care, Other (non HMO) | Source: Ambulatory Visit | Attending: Internal Medicine | Admitting: Internal Medicine

## 2016-02-21 DIAGNOSIS — R928 Other abnormal and inconclusive findings on diagnostic imaging of breast: Secondary | ICD-10-CM

## 2016-03-20 ENCOUNTER — Telehealth: Payer: Self-pay | Admitting: Rheumatology

## 2016-03-20 NOTE — Telephone Encounter (Signed)
Patient would like to speak to Dr. Orson AloeHenderson about a decision she made about a medication. Please call patient.

## 2016-03-20 NOTE — Telephone Encounter (Signed)
Called patient.  She asked whether it would be okay to start taking the over the counter medication turmeric.  I reviewed patient's medications.  Noted patient takes ibuprofen as needed.  Advised patient to avoid ibuprofen while taking turmeric.  No other drug interactions noted.  Counseled patient on the purpose, proper use, and adverse effects of turmeric including the increased risk of bruising/bleeding.  Patient voiced understanding and denied any questions or concerns.    [name], Pharm.D., BCPS Clinical Pharmacist Pager: [phone] Phone: [phone] 03/20/2016 12:19 PM

## 2016-03-30 ENCOUNTER — Ambulatory Visit (INDEPENDENT_AMBULATORY_CARE_PROVIDER_SITE_OTHER): Payer: Managed Care, Other (non HMO) | Admitting: Rheumatology

## 2016-03-30 ENCOUNTER — Encounter: Payer: Self-pay | Admitting: Rheumatology

## 2016-03-30 VITALS — BP 124/83 | HR 85 | Resp 14 | Ht 66.0 in | Wt 140.0 lb

## 2016-03-30 DIAGNOSIS — Z79899 Other long term (current) drug therapy: Secondary | ICD-10-CM | POA: Diagnosis not present

## 2016-03-30 DIAGNOSIS — M19041 Primary osteoarthritis, right hand: Secondary | ICD-10-CM

## 2016-03-30 DIAGNOSIS — M170 Bilateral primary osteoarthritis of knee: Secondary | ICD-10-CM

## 2016-03-30 DIAGNOSIS — M1120 Other chondrocalcinosis, unspecified site: Secondary | ICD-10-CM

## 2016-03-30 DIAGNOSIS — M19042 Primary osteoarthritis, left hand: Secondary | ICD-10-CM | POA: Diagnosis not present

## 2016-03-30 DIAGNOSIS — M797 Fibromyalgia: Secondary | ICD-10-CM | POA: Diagnosis not present

## 2016-03-30 DIAGNOSIS — M359 Systemic involvement of connective tissue, unspecified: Secondary | ICD-10-CM

## 2016-03-30 MED ORDER — DICLOFENAC SODIUM 1 % TD GEL: TRANSDERMAL | 3 refills | Status: DC

## 2016-03-30 NOTE — Progress Notes (Signed)
Office Visit Note  Patient: Stefanie [name]             Date of Birth: [birth year]           MRN: [id number]             PCP: Precious Reel, MD Referring: Shon Baton, MD Visit Date: 03/30/2016 Occupation: '@[email or web address]' @    Subjective:  No chief complaint on file. Worked in today secondary to left knee pain for the last 1-1/2 weeks which she rated as 9 on a scale of 0-10. However, today her left knee pain is all resolved.  History of Present Illness: Stefanie [name] is a 56 y.o. female  Last seen 12/18/2015 Patient was worked in today because of left knee pain for the last 1-1/2 weeks. She describes the pain as catching at times. She describes the pain as 9 on a scale of 0-10.  She elevated her leg and rested it and was off of it for some time and now her pain is all resolved.  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  Her autoimmune disease is doing well. No oral or nasal ulcers, no new rashes, no fatigue from autoimmune disease but she does have fibromyalgia and she does have some fatigue from that No cervical lymphadenopathy.  She is taking Plaquenil as prescribed at 200 mg in the morning and 190.  Her last Plaquenil eye exam was through Dr. Gillian Scarce office a few months ago and everything was "normal".  Myalgia is stable. She has ongoing pain ongoing fatigue and ongoing tender points.   Activities of Daily Living:  Patient reports morning stiffness for 15 minutes.   Patient Reports nocturnal pain.  Difficulty dressing/grooming: Denies Difficulty climbing stairs: Reports Difficulty getting out of chair: Reports Difficulty using hands for taps, buttons, cutlery, and/or writing: Reports   Review of Systems  Constitutional: Positive for fatigue.  HENT: Negative for mouth sores and mouth dryness.   Eyes: Negative for dryness.  Respiratory: Negative for  shortness of breath.   Gastrointestinal: Negative for constipation and diarrhea.  Musculoskeletal: Positive for myalgias and myalgias.  Skin: Negative for sensitivity to sunlight.  Psychiatric/Behavioral: Positive for sleep disturbance. Negative for decreased concentration.    PMFS History:  Patient Active Problem List   Diagnosis Date Noted  . Hyponatremia 12/12/2013  . Fever 12/13/2012  . Pleuritic chest pain 12/06/2012  . SLE (systemic lupus erythematosus) ([place]) 12/06/2012  . 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 Anxiety 12/06/2012  . ANEMIA-IRON DEFICIENCY 01/08/2010  . GASTRIC DILATION, ACUTE 01/08/2010  . NAUSEA ALONE 01/08/2010  . ABDOMINAL PAIN, LEFT UPPER QUADRANT 01/08/2010    Past Medical History:  Diagnosis Date  . Anemia   . Arthritis   . Asthma   . Diabetes insipidus ([place])   . Fibromyalgia   . Lupus   . Mitral valve prolapse   . SLE (systemic lupus erythematosus) ([place]) 12/06/2012  . Thyroid disease     Family History  Problem Relation Age of Onset  . Diabetes Father   . Heart disease Father   . Hyperlipidemia Father   . Hypertension Father   . Stroke Father   . Diabetes Sister   . Cancer Maternal Grandmother   . Diabetes Paternal Grandfather   . Heart disease Paternal Grandfather   . Hyperlipidemia Paternal Grandfather   .  Hypertension Paternal Grandfather    Past Surgical History:  Procedure Laterality Date  . NO PAST SURGERIES     Social History   Social History Narrative  . No narrative on file     Objective: Vital Signs: BP 124/83 (BP Location: Right Arm)   Pulse 85   Resp 14   Ht '5\' 6"'  (1.676 m)   Wt 140 lb (63.5 kg)   BMI 22.60 kg/m    Physical Exam  Constitutional: She is oriented to person, place, and time. She appears well-developed and well-nourished.  HENT:  Head: Normocephalic and atraumatic.  Eyes: EOM are normal. Pupils are equal, round, and reactive to light.  Cardiovascular: Normal rate, regular rhythm and  normal heart sounds.  Exam reveals no gallop and no friction rub.   No murmur heard. Pulmonary/Chest: Effort normal and breath sounds normal. She has no wheezes. She has no rales.  Abdominal: Soft. Bowel sounds are normal. She exhibits no distension. There is no tenderness. There is no guarding. No hernia.  Musculoskeletal: Normal range of motion. She exhibits no edema, tenderness or deformity.  Lymphadenopathy:    She has no cervical adenopathy.  Neurological: She is alert and oriented to person, place, and time. Coordination normal.  Skin: Skin is warm and dry. Capillary refill takes less than 2 seconds. No rash noted.  Psychiatric: She has a normal mood and affect. Her behavior is normal.  Nursing note and vitals reviewed.    Musculoskeletal Exam:  Full range of motion of all joints Grip strength is equal and strong bilaterally Fiber myalgia tender points are 16 out of 18 positive. She has exquisite tenderness to bilateral initial bursa upon palpation, as well as sitting.  CDAI Exam: CDAI Homunculus Exam:   Joint Counts:  CDAI Tender Joint count: 0 CDAI Swollen Joint count: 0  Global Assessments:  Patient Global Assessment: 0 Provider Global Assessment: 0   No synovitis on examination Patient does have fibromyalgia discomfort She does have left knee flare but all resolved today She does have left CMC pain for which she uses a brace and will use Voltaren gel  Investigation: No additional findings.  Orders Only on 02/06/2016  Component Date Value Ref Range Status  . Sodium 02/06/2016 132* 135 - 146 mmol/L Final  . Potassium 02/06/2016 4.2  3.5 - 5.3 mmol/L Final  . Chloride 02/06/2016 98  98 - 110 mmol/L Final  . CO2 02/06/2016 27  20 - 31 mmol/L Final  . Glucose, Bld 02/06/2016 85  65 - 99 mg/dL Final  . BUN 02/06/2016 13  7 - 25 mg/dL Final  . Creat 02/06/2016 0.70  0.50 - 1.05 mg/dL Final   Comment:   For patients > or = 57 years of age: The upper reference limit  for Creatinine is approximately 13% higher for people identified as African-American.     . Total Bilirubin 02/06/2016 0.5  0.2 - 1.2 mg/dL Final  . Alkaline Phosphatase 02/06/2016 86  33 - 130 U/L Final  . AST 02/06/2016 18  10 - 35 U/L Final  . ALT 02/06/2016 13  6 - 29 U/L Final  . Total Protein 02/06/2016 7.3  6.1 - 8.1 g/dL Final  . Albumin 02/06/2016 3.9  3.6 - 5.1 g/dL Final  . Calcium 02/06/2016 9.2  8.6 - 10.4 mg/dL Final  . GFR, Est African American 02/06/2016 >89  >=60 mL/min Final  . GFR, Est Non African American 02/06/2016 >89  >=60 mL/min Final  . WBC 02/06/2016  3.9  3.8 - 10.8 K/uL Final  . RBC 02/06/2016 4.35  3.80 - 5.10 MIL/uL Final  . Hemoglobin 02/06/2016 12.2  11.7 - 15.5 g/dL Final  . HCT 02/06/2016 37.0  35.0 - 45.0 % Final  . MCV 02/06/2016 85.1  80.0 - 100.0 fL Final  . MCH 02/06/2016 28.0  27.0 - 33.0 pg Final  . MCHC 02/06/2016 33.0  32.0 - 36.0 g/dL Final  . RDW 02/06/2016 13.9  11.0 - 15.0 % Final  . Platelets 02/06/2016 247  140 - 400 K/uL Final  . MPV 02/06/2016 10.2  7.5 - 12.5 fL Final  . Neutro Abs 02/06/2016 2184  1,500 - 7,800 cells/uL Final  . Lymphs Abs 02/06/2016 1170  850 - 3,900 cells/uL Final  . Monocytes Absolute 02/06/2016 507  200 - 950 cells/uL Final  . Eosinophils Absolute 02/06/2016 39  15 - 500 cells/uL Final  . Basophils Absolute 02/06/2016 0  0 - 200 cells/uL Final  . Neutrophils Relative % 02/06/2016 56  % Final  . Lymphocytes Relative 02/06/2016 30  % Final  . Monocytes Relative 02/06/2016 13  % Final  . Eosinophils Relative 02/06/2016 1  % Final  . Basophils Relative 02/06/2016 0  % Final  . Smear Review 02/06/2016 Criteria for review not met   Final    Imaging: No results found.  Speciality Comments: No specialty comments available.    Procedures:  No procedures performed Allergies: Venofer [ferric oxide] and Azithromycin   Assessment / Plan:     Visit Diagnoses: Autoimmune disease ([place])  CPDD (calcium  pyrophosphate deposition disease)  High risk medications (not anticoagulants) long-term use - plq 200am 100qhs; plq eye sept 2017 dr Prudencio Burly;  Fibromyalgia  Primary osteoarthritis of both knees  Primary osteoarthritis of both hands   Plan: #1: Autoimmune disease. Stable. No oral or nasal ulcers. Using Plaquenil 200 and a morning and 100 at night.  #2: Left knee pain for the last 1-1/2 weeks. Rated her pain is 9 on a scale of 0-10. Now her left knee is all better. I offered her cortisone injection but she declined for now since she is doing so well. I've advised her to make an appointment if she needs an injection and if a flare occurs again. In the meanwhile I've given her Voltaren gel which she can use for that  #3: Left CMC pain. Will use Voltaren gel when necessary. Also uses a brace.  #4: Bilateral issue bursitis. Will use doughnut for this. Shortly has one at home.  #5: CPPD. Is using colchicine 0.6 mg every day. She is adequate amount of medication at home.  #6: And is a follow-up appointment 05/12/2016. She will keep that appointment.  #7: Her labs are up-to-date and normal. He does not need any medication refill  Orders: No orders of the defined types were placed in this encounter.  Meds ordered this encounter  Medications  . diclofenac sodium (VOLTAREN) 1 % GEL    Sig: Voltaren Gel 3 grams to 3 large joints upto TID 3 TUBES with 3 refills    Dispense:  3 Tube    Refill:  3    Voltaren Gel 3 grams to 3 large joints upto TID 3 TUBES with 3 refills    Order Specific Question:   Supervising Provider    Answer:   Lyda Perone    Face-to-face time spent with patient was 30 minutes. 50% of time was spent in counseling and coordination of care.  Follow-Up  Instructions: Return in about 43 days (around 05/12/2016) for left knee, A.D., cppd, oa hand and kj; [name]   Eliezer Lofts, PA-C  Note - This record has been created using [employer].  Chart creation errors  have been sought, but may not always  have been located. Such creation errors do not reflect on  the standard of medical care.

## 2016-04-01 ENCOUNTER — Telehealth: Payer: Self-pay

## 2016-04-01 NOTE — Telephone Encounter (Signed)
Received a notice of approval from CoverMyMeds. Patient has been approved for her Voltaren Gel from 03/02/16-03/17/2019.  Reference number: [id number]  Left message for patient to call back for an update.  Will send documents to scan center.   Alesandro Stueve, [place], CPhT

## 2016-04-02 ENCOUNTER — Other Ambulatory Visit: Payer: Self-pay | Admitting: Rheumatology

## 2016-04-02 NOTE — Telephone Encounter (Addendum)
Last Visit: 03/30/16 Next Visit: 05/12/16 Labs: 02/06/16 WNL PLQ eye exam: 11/2015 WNL  Okay to refill PLQ?

## 2016-04-20 NOTE — Progress Notes (Signed)
Cardiology Office Note    Date:  04/23/2016   ID:  Stefanie [name], DOB October 27, 1960, MRN [id number]  PCP:  Stefanie Pounds, MD  Cardiologist:  New- seen by Dr. Eden [name] in 2010  CC: chest pain / palpitations  History of Present Illness:  Stefanie [name] is a 56 y.o. 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   She was admitted in 11/2012 severe R Chest/Shoulder/back pain c/w lupus flare causing pleurisy vrs severe constcochonditis with anxiety and SOB. No Shingles seen. EKG/CXR/CTPA were all (-). She ruled out for MI. ECHO showed EF 55-60% c no wall motion abnormalities, mild MR. She was treated with pain management and discharged home.  Today she presents to clinic for evaluation of chest pain and palpitations. She was out of work over the past two weeks last month due to not feeling well. She felt like the walls were closing in on her and felt like something was very wrong with her. Her heart was pounding. SBP 190. HR 100. Palms and feet with sweating and started shaking uncontrollably. Felt like she was going to die. She felt pressure in her chest and heart racing. She never went to hospital. She took deep breaths until it finally went away. She has had some tingling in her fingers.   Over the last month or so she has felt like her heart has occasional racing and irregular heart beats. Gets uncomfortable feeling like she might pass out. She does get some associated chest pain that is sharp. No orthopnea or PND. She is very active at work around the house but doesn't formally exercise. Does get exertional SOB. She had a little bit of blood in her stool the other day. No history of hemorrhoids.   She has a family history of very early CAD. Her father had a CVA in his 7s and father had first heart event in 30s. He  also had bypass surgery. Father's side of the family has a lot of CAD.    Past Medical History:  Diagnosis Date  . Anemia   . Arthritis   . Asthma   . Diabetes insipidus (HCC)   . Fibromyalgia   . Lupus   . Mitral valve prolapse   . SLE (systemic lupus erythematosus) (HCC) 12/06/2012  . Thyroid disease     Past Surgical History:  Procedure Laterality Date  . NO PAST SURGERIES      Current Medications: Outpatient Medications Prior to Visit  Medication Sig Dispense Refill  . albuterol (PROVENTIL HFA;VENTOLIN HFA) 108 (90 BASE) MCG/ACT inhaler Inhale 1 puff into the lungs every 6 (six) hours as needed for wheezing or shortness of breath.    . Cholecalciferol (VITAMIN D PO) Take 1 capsule by mouth daily.    . Colchicine 0.6 MG CAPS Take 1 capsule by mouth daily.  5  . desmopressin (DDAVP RHINAL TUBE) 0.01 % SOLN Place 1 spray into the nose every other day. At bedtime    . diclofenac sodium (VOLTAREN) 1 % GEL Voltaren Gel 3 grams to 3 large joints upto TID 3 TUBES with 3 refills 3 Tube 3  . hydroxychloroquine (PLAQUENIL) 200 MG tablet TAKE 1 TABLET EVERY MORNING AND 1/2 TABLET EVERY EVENING 45 tablet 2  . ibuprofen (ADVIL,MOTRIN) 200 MG tablet Take 400 mg by mouth every 6 (six) hours as  needed for pain.    [name] levothyroxine (SYNTHROID, LEVOTHROID) 125 MCG tablet Take 150 mcg by mouth at bedtime.     . nitroGLYCERIN (NITROSTAT) 0.4 MG SL tablet Place 0.4 mg under the tongue every 5 (five) minutes as needed for chest pain.    . rizatriptan (MAXALT) 10 MG tablet Take 10 mg by mouth daily as needed for migraine. May repeat in 2 hours if needed    . topiramate (TOPAMAX) 100 MG tablet Take 100 mg by mouth daily.    . Turmeric 450 MG CAPS Take by mouth.     No facility-administered medications prior to visit.      Allergies:   Venofer [ferric oxide] and Azithromycin   Social History   Social History  . Marital status: Married    Spouse name: N/A  . Number of children: N/A  . Years of  education: N/A   Social History Main Topics  . Smoking status: Never Smoker  . Smokeless tobacco: Never Used  . Alcohol use No  . Drug use: No  . Sexual activity: Not Asked   Other Topics Concern  . None   Social History Narrative  . None     Family History:  The patient's family history includes Cancer in her maternal grandmother; Diabetes in her father, paternal grandfather, and sister; Heart disease in her father and paternal grandfather; Hyperlipidemia in her father and paternal grandfather; Hypertension in her father and paternal grandfather; Stroke in her father.      ROS:   Please see the history of present illness.    ROS All other systems reviewed and are negative.   PHYSICAL EXAM:   VS:  BP 106/70   Pulse 80   Ht 5\' 6"  (1.676 m)   Wt 142 lb 1.9 oz (64.5 kg)   SpO2 98%   BMI 22.94 kg/m    GEN: Well nourished, well developed, in no acute distress  HEENT: normal  Neck: no JVD, carotid bruits, or masses Cardiac: RRR; no murmurs, rubs, or gallops,no edema  Respiratory:  clear to auscultation bilaterally, normal work of breathing GI: soft, nontender, nondistended, + BS MS: no deformity or atrophy  Skin: warm and dry, no rash Neuro:  Alert and Oriented x 3, Strength and sensation are intact Psych: euthymic mood, full affect  Wt Readings from Last 3 Encounters:  04/23/16 142 lb 1.9 oz (64.5 kg)  03/30/16 140 lb (63.5 kg)  10/29/14 143 lb (64.9 kg)      Studies/Labs Reviewed:   EKG:  EKG is ordered today.  The ekg ordered today demonstrates NSR HR 74  Recent Labs: 02/06/2016: ALT 13; BUN 13; Creat 0.70; Hemoglobin 12.2; Platelets 247; Potassium 4.2; Sodium 132   Lipid Panel No results found for: CHOL, TRIG, HDL, CHOLHDL, VLDL, LDLCALC, LDLDIRECT  Additional studies/ records that were reviewed today include:  2D ECHO: 12/07/2012 LV EF: 55% -  60% Study Conclusion - Left ventricle: The cavity size was normal. Systolic function was normal. The  estimated ejection fraction wasin the range of 55% to 60%. Wall motion was normal; therewere no regional wall motion abnormalities. - Mitral valve: Mild regurgitation. - Atrial septum: No defect or patent foramen ovale was identified.   ASSESSMENT & PLAN:   Chest pain/dyspnea on exertion: atypical but also has dyspnea on exertion. She does have chronic inflammatory condition and strong family history of early CAD. Will check a lipid panel, 2D ECHO and lexiscan myoview. If lipids elevated, will start low dose statin  for primary prevention  Palpitations: seen by Dr. Eden EmmsNishan in the past and told she had PVCs/PACs? Will place a 30 day event monitor  SLE: on plaquenil, followed by rheum.   Hyponatremia: followed closely by Dr Timothy Lassousso   Diabetes insipidus: on DDVAP   Medication Adjustments/Labs and Tests Ordered: Current medicines are reviewed at length with the patient today.  Concerns regarding medicines are outlined above.  Medication changes, Labs and Tests ordered today are listed in the Patient Instructions below. Patient Instructions  Medication Instructions:  Your physician recommends that you continue on your current medications as directed. Please refer to the Current Medication list given to you today.   Labwork: TODAY:  LIPID PANEL   Testing/Procedures: Your physician has requested that you have a lexiscan myoview. For further information please visit [email or web address]. Please follow instruction sheet, as given.  Your physician has recommended that you wear an event monitor. Event monitors are medical devices that record the heart's electrical activity. Doctors most often us these monitors to diagnose arrhythmias. Arrhythmias are problems with the speed or rhythm of the heartbeat. The monitor is a small, portable device. You can wear one while you do your normal daily activities. This is usually used to diagnose what is causing palpitations/syncope (passing out).  Your  physician has requested that you have an echocardiogram. Echocardiography is a painless test that uses sound waves to create images of your heart. It provides your doctor with information about the size and shape of your heart and how well your heart's chambers and valves are working. This procedure takes approximately one hour. There are no restrictions for this procedure.   Follow-Up: Your physician recommends that you schedule a follow-up appointment in: 3 MONTHS WITH DR. Eden EmmsNISHAN   Any Other Special Instructions Will Be Listed Below (If Applicable).   Pharmacologic Stress Electrocardiogram A pharmacologic stress electrocardiogram is a heart (cardiac) test that uses nuclear imaging to evaluate the blood supply to your heart. This test may also be called a pharmacologic stress electrocardiography. Pharmacologic means that a medicine is used to increase your heart rate and blood pressure.  This stress test is done to find areas of poor blood flow to the heart by determining the extent of coronary artery disease (CAD). Some people exercise on a treadmill, which naturally increases the blood flow to the heart. For those people unable to exercise on a treadmill, a medicine is used. This medicine stimulates your heart and will cause your heart to beat harder and more quickly, as if you were exercising.  Pharmacologic stress tests can help determine:  The adequacy of blood flow to your heart during increased levels of activity in order to clear you for discharge home.  The extent of coronary artery blockage caused by CAD.  Your prognosis if you have suffered a heart attack.  The effectiveness of cardiac procedures done, such as an angioplasty, which can increase the circulation in your coronary arteries.  Causes of chest pain or pressure. LET [facility] PROVID[facility] KNOW ABOUT:  Any allergies you have.  All medicines you are taking, including vitamins, herbs, eye drops, creams, and  over-the-counter medicines.  Previous problems you or members of your family have had with the use of anesthetics.  Any blood disorders you have.  Previous surgeries you have had.  Medical conditions you have.  Possibility of pregnancy, if this applies.  If you are currently breastfeeding. RISKS AND COMPLICATIONS Generally, this is a safe procedure. However, as with any procedure, complications  can occur. Possible complications include:  You develop pain or pressure in the following areas:  Chest.  Jaw or neck.  Between your shoulder blades.  Radiating down your left arm.  Headache.  Dizziness or light-headedness.  Shortness of breath.  Increased or irregular heartbeat.  Low blood pressure.  Nausea or vomiting.  Flushing.  Redness going up the arm and slight pain during injection of medicine.  Heart attack (rare). BEFORE THE PROCEDURE   Avoid all forms of caffeine for 24 hours before your test or as directed by your health care provider. This includes coffee, tea (even decaffeinated tea), caffeinated sodas, chocolate, cocoa, and certain pain medicines.  Follow your health care provider's instructions regarding eating and drinking before the test.  Take your medicines as directed at regular times with water unless instructed otherwise. Exceptions may include:  If you have diabetes, ask how you are to take your insulin or pills. It is common to adjust insulin dosing the morning of the test.  If you are taking beta-blocker medicines, it is important to talk to your health care provider about these medicines well before the date of your test. Taking beta-blocker medicines may interfere with the test. In some cases, these medicines need to be changed or stopped 24 hours or more before the test.  If you wear a nitroglycerin patch, it may need to be removed prior to the test. Ask your health care provider if the patch should be removed before the test.  If you use an  inhaler for any breathing condition, bring it with you to the test.  If you are an outpatient, bring a snack so you can eat right after the stress phase of the test.  Do not smoke for 4 hours prior to the test or as directed by your health care provider.  Do not apply lotions, powders, creams, or oils on your chest prior to the test.  Wear comfortable shoes and clothing. Let your health care provider know if you were unable to complete or follow the preparations for your test. PROCEDURE   Multiple patches (electrodes) will be put on your chest. If needed, small areas of your chest may be shaved to get better contact with the electrodes. Once the electrodes are attached to your body, multiple wires will be attached to the electrodes, and your heart rate will be monitored.  An IV access will be started. A nuclear trace (isotope) is given. The isotope may be given intravenously, or it may be swallowed. Nuclear refers to several types of radioactive isotopes, and the nuclear isotope lights up the arteries so that the nuclear images are clear. The isotope is absorbed by your body. This results in low radiation exposure.  A resting nuclear image is taken to show how your heart functions at rest.  A medicine is given through the IV access.  A second scan is done about 1 hour after the medicine injection and determines how your heart functions under stress.  During this stress phase, you will be connected to an electrocardiogram machine. Your blood pressure and oxygen levels will be monitored. AFTER THE PROCEDURE   Your heart rate and blood pressure will be monitored after the test.  You may return to your normal schedule, including diet,activities, and medicines, unless your health care provider tells you otherwise. This information is not intended to replace advice given to you by your health care provider. Make sure you discuss any questions you have with your health care provider. Document  Released: 07/12/2008 Document Revised: 02/28/2013 Document Reviewed: 10/31/2012 Elsevier Interactive Patient Education  2017 [employer]  Cardiac Event Monitoring A cardiac event monitor is a small recording device used to help detect abnormal heart rhythms (arrhythmias). The monitor is used to record heart rhythm when noticeable symptoms such as the following occur:  Fast heartbeats (palpitations), such as heart racing or fluttering.  Dizziness.  Fainting or light-headedness.  Unexplained weakness. The monitor is wired to two electrodes placed on your chest. Electrodes are flat, sticky disks that attach to your skin. The monitor can be worn for up to 30 days. You will wear the monitor at all times, except when bathing. How to use your cardiac event monitor A technician will prepare your chest for the electrode placement. The technician will show you how to place the electrodes, how to work the monitor, and how to replace the batteries. Take time to practice using the monitor before you leave the office. Make sure you understand how to send the information from the monitor to your health care provider. This requires a telephone with a landline, not a cell phone. You need to:  Wear your monitor at all times, except when you are in water:  Do not get the monitor wet.  Take the monitor off when bathing. Do not swim or use a hot tub with it on.  Keep your skin clean. Do not put body lotion or moisturizer on your chest.  Change the electrodes daily or any time they stop sticking to your skin. You might need to use tape to keep them on.  It is possible that your skin under the electrodes could become irritated. To keep this from happening, try to put the electrodes in slightly different places on your chest. However, they must remain in the area under your left breast and in the upper right section of your chest.  Make sure the monitor is safely clipped to your clothing or in a location  close to your body that your health care provider recommends.  Press the button to record when you feel symptoms of heart trouble, such as dizziness, weakness, light-headedness, palpitations, thumping, shortness of breath, unexplained weakness, or a fluttering or racing heart. The monitor is always on and records what happened slightly before you pressed the button, so do not worry about being too late to get good information.  Keep a diary of your activities, such as walking, doing chores, and taking medicine. It is especially important to note what you were doing when you pushed the button to record your symptoms. This will help your health care provider determine what might be contributing to your symptoms. The information stored in your monitor will be reviewed by your health care provider alongside your diary entries.  Send the recorded information as recommended by your health care provider. It is important to understand that it will take some time for your health care provider to process the results.  Change the batteries as recommended by your health care provider. Get help right away if:  You have chest pain.  You have extreme difficulty breathing or shortness of breath.  You develop a very fast heartbeat that persists.  You develop dizziness that does not go away.  You faint or constantly feel you are about to faint. This information is not intended to replace advice given to you by your health care provider. Make sure you discuss any questions you have with your health care provider. Document Released: 12/03/2007 Document Revised: 08/01/2015  Document Reviewed: 08/22/2012 Elsevier Interactive Patient Education  2017 [employer] Inc.  Echocardiogram An echocardiogram, or echocardiography, uses sound waves (ultrasound) to produce an image of your heart. The echocardiogram is simple, painless, obtained within a short period of time, and offers valuable information to your health care  provider. The images from an echocardiogram can provide information such as:  Evidence of coronary artery disease (CAD).  Heart size.  Heart muscle function.  Heart valve function.  Aneurysm detection.  Evidence of a past heart attack.  Fluid buildup around the heart.  Heart muscle thickening.  Assess heart valve function. Tell a health care provider about:  Any allergies you have.  All medicines you are taking, including vitamins, herbs, eye drops, creams, and over-the-counter medicines.  Any problems you or family members have had with anesthetic medicines.  Any blood disorders you have.  Any surgeries you have had.  Any medical conditions you have.  Whether you are pregnant or may be pregnant. What happens before the procedure? No special preparation is needed. Eat and drink normally. What happens during the procedure?  In order to produce an image of your heart, gel will be applied to your chest and a wand-like tool (transducer) will be moved over your chest. The gel will help transmit the sound waves from the transducer. The sound waves will harmlessly bounce off your heart to allow the heart images to be captured in real-time motion. These images will then be recorded.  You may need an IV to receive a medicine that improves the quality of the pictures. What happens after the procedure? You may return to your normal schedule including diet, activities, and medicines, unless your health care provider tells you otherwise. This information is not intended to replace advice given to you by your health care provider. Make sure you discuss any questions you have with your health care provider. Document Released: 02/21/2000 Document Revised: 10/12/2015 Document Reviewed: 10/31/2012 Elsevier Interactive Patient Education  2017 [employer].   If you need a refill on your cardiac medications before your next appointment, please call your pharmacy.       Signed, Cline Crock, PA-C  04/23/2016 9:23 AM    [facility] [address] [place], [place], [place]  [address] Phone: [phone]; Fax: [phone]

## 2016-04-23 ENCOUNTER — Ambulatory Visit (INDEPENDENT_AMBULATORY_CARE_PROVIDER_SITE_OTHER): Payer: Managed Care, Other (non HMO) | Admitting: Physician Assistant

## 2016-04-23 ENCOUNTER — Ambulatory Visit (INDEPENDENT_AMBULATORY_CARE_PROVIDER_SITE_OTHER): Payer: 59

## 2016-04-23 ENCOUNTER — Encounter: Payer: Self-pay | Admitting: Physician Assistant

## 2016-04-23 VITALS — BP 106/70 | HR 80 | Ht 66.0 in | Wt 142.1 lb

## 2016-04-23 DIAGNOSIS — M3219 Other organ or system involvement in systemic lupus erythematosus: Secondary | ICD-10-CM | POA: Diagnosis not present

## 2016-04-23 DIAGNOSIS — R002 Palpitations: Secondary | ICD-10-CM

## 2016-04-23 DIAGNOSIS — R079 Chest pain, unspecified: Secondary | ICD-10-CM

## 2016-04-23 DIAGNOSIS — E871 Hypo-osmolality and hyponatremia: Secondary | ICD-10-CM | POA: Diagnosis not present

## 2016-04-23 DIAGNOSIS — E232 Diabetes insipidus: Secondary | ICD-10-CM

## 2016-04-23 LAB — LIPID PANEL
Chol/HDL Ratio: 2.6 ratio units (ref 0.0–4.4)
Cholesterol, Total: 153 mg/dL (ref 100–199)
HDL: 60 mg/dL (ref >39)
LDL Calculated: 81 mg/dL (ref 0–99)
TRIGLYCERIDES: 59 mg/dL (ref 0–149)
VLDL Cholesterol Cal: 12 mg/dL (ref 5–40)

## 2016-04-23 NOTE — Patient Instructions (Addendum)
Medication Instructions:  Your physician recommends that you continue on your current medications as directed. Please refer to the Current Medication list given to you today.   Labwork: TODAY:  LIPID PANEL   Testing/Procedures: Your physician has requested that you have a lexiscan myoview. For further information please visit [email or web address]. Please follow instruction sheet, as given.  Your physician has recommended that you wear an event monitor. Event monitors are medical devices that record the heart's electrical activity. Doctors most often us these monitors to diagnose arrhythmias. Arrhythmias are problems with the speed or rhythm of the heartbeat. The monitor is a small, portable device. You can wear one while you do your normal daily activities. This is usually used to diagnose what is causing palpitations/syncope (passing out).  Your physician has requested that you have an echocardiogram. Echocardiography is a painless test that uses sound waves to create images of your heart. It provides your doctor with information about the size and shape of your heart and how well your heart's chambers and valves are working. This procedure takes approximately one hour. There are no restrictions for this procedure.   Follow-Up: Your physician recommends that you schedule a follow-up appointment in: 3 MONTHS WITH DR. Eden EmmsNISHAN   Any Other Special Instructions Will Be Listed Below (If Applicable).   Pharmacologic Stress Electrocardiogram A pharmacologic stress electrocardiogram is a heart (cardiac) test that uses nuclear imaging to evaluate the blood supply to your heart. This test may also be called a pharmacologic stress electrocardiography. Pharmacologic means that a medicine is used to increase your heart rate and blood pressure.  This stress test is done to find areas of poor blood flow to the heart by determining the extent of coronary artery disease (CAD). Some people exercise on a  treadmill, which naturally increases the blood flow to the heart. For those people unable to exercise on a treadmill, a medicine is used. This medicine stimulates your heart and will cause your heart to beat harder and more quickly, as if you were exercising.  Pharmacologic stress tests can help determine:  The adequacy of blood flow to your heart during increased levels of activity in order to clear you for discharge home.  The extent of coronary artery blockage caused by CAD.  Your prognosis if you have suffered a heart attack.  The effectiveness of cardiac procedures done, such as an angioplasty, which can increase the circulation in your coronary arteries.  Causes of chest pain or pressure. LET [facility] PROVIDER KNOW ABOUT:  Any allergies you have.  All medicines you are taking, including vitamins, herbs, eye drops, creams, and over-the-counter medicines.  Previous problems you or members of your family have had with the use of anesthetics.  Any blood disorders you have.  Previous surgeries you have had.  Medical conditions you have.  Possibility of pregnancy, if this applies.  If you are currently breastfeeding. RISKS AND COMPLICATIONS Generally, this is a safe procedure. However, as with any procedure, complications can occur. Possible complications include:  You develop pain or pressure in the following areas:  Chest.  Jaw or neck.  Between your shoulder blades.  Radiating down your left arm.  Headache.  Dizziness or light-headedness.  Shortness of breath.  Increased or irregular heartbeat.  Low blood pressure.  Nausea or vomiting.  Flushing.  Redness going up the arm and slight pain during injection of medicine.  Heart attack (rare). BEFORE THE PROCEDURE   Avoid all forms of caffeine for 24 hours before  your test or as directed by your health care provider. This includes coffee, tea (even decaffeinated tea), caffeinated sodas, chocolate,  cocoa, and certain pain medicines.  Follow your health care provider's instructions regarding eating and drinking before the test.  Take your medicines as directed at regular times with water unless instructed otherwise. Exceptions may include:  If you have diabetes, ask how you are to take your insulin or pills. It is common to adjust insulin dosing the morning of the test.  If you are taking beta-blocker medicines, it is important to talk to your health care provider about these medicines well before the date of your test. Taking beta-blocker medicines may interfere with the test. In some cases, these medicines need to be changed or stopped 24 hours or more before the test.  If you wear a nitroglycerin patch, it may need to be removed prior to the test. Ask your health care provider if the patch should be removed before the test.  If you use an inhaler for any breathing condition, bring it with you to the test.  If you are an outpatient, bring a snack so you can eat right after the stress phase of the test.  Do not smoke for 4 hours prior to the test or as directed by your health care provider.  Do not apply lotions, powders, creams, or oils on your chest prior to the test.  Wear comfortable shoes and clothing. Let your health care provider know if you were unable to complete or follow the preparations for your test. PROCEDURE   Multiple patches (electrodes) will be put on your chest. If needed, small areas of your chest may be shaved to get better contact with the electrodes. Once the electrodes are attached to your body, multiple wires will be attached to the electrodes, and your heart rate will be monitored.  An IV access will be started. A nuclear trace (isotope) is given. The isotope may be given intravenously, or it may be swallowed. Nuclear refers to several types of radioactive isotopes, and the nuclear isotope lights up the arteries so that the nuclear images are clear. The  isotope is absorbed by your body. This results in low radiation exposure.  A resting nuclear image is taken to show how your heart functions at rest.  A medicine is given through the IV access.  A second scan is done about 1 hour after the medicine injection and determines how your heart functions under stress.  During this stress phase, you will be connected to an electrocardiogram machine. Your blood pressure and oxygen levels will be monitored. AFTER THE PROCEDURE   Your heart rate and blood pressure will be monitored after the test.  You may return to your normal schedule, including diet,activities, and medicines, unless your health care provider tells you otherwise. This information is not intended to replace advice given to you by your health care provider. Make sure you discuss any questions you have with your health care provider. Document Released: 07/12/2008 Document Revised: 02/28/2013 Document Reviewed: 10/31/2012 Elsevier Interactive Patient Education  2017 [employer] Inc.  Cardiac Event Monitoring A cardiac event monitor is a small recording device used to help detect abnormal heart rhythms (arrhythmias). The monitor is used to record heart rhythm when noticeable symptoms such as the following occur:  Fast heartbeats (palpitations), such as heart racing or fluttering.  Dizziness.  Fainting or light-headedness.  Unexplained weakness. The monitor is wired to two electrodes placed on your chest. Electrodes are flat, sticky  disks that attach to your skin. The monitor can be worn for up to 30 days. You will wear the monitor at all times, except when bathing. How to use your cardiac event monitor A technician will prepare your chest for the electrode placement. The technician will show you how to place the electrodes, how to work the monitor, and how to replace the batteries. Take time to practice using the monitor before you leave the office. Make sure you understand how to  send the information from the monitor to your health care provider. This requires a telephone with a landline, not a cell phone. You need to:  Wear your monitor at all times, except when you are in water:  Do not get the monitor wet.  Take the monitor off when bathing. Do not swim or use a hot tub with it on.  Keep your skin clean. Do not put body lotion or moisturizer on your chest.  Change the electrodes daily or any time they stop sticking to your skin. You might need to use tape to keep them on.  It is possible that your skin under the electrodes could become irritated. To keep this from happening, try to put the electrodes in slightly different places on your chest. However, they must remain in the area under your left breast and in the upper right section of your chest.  Make sure the monitor is safely clipped to your clothing or in a location close to your body that your health care provider recommends.  Press the button to record when you feel symptoms of heart trouble, such as dizziness, weakness, light-headedness, palpitations, thumping, shortness of breath, unexplained weakness, or a fluttering or racing heart. The monitor is always on and records what happened slightly before you pressed the button, so do not worry about being too late to get good information.  Keep a diary of your activities, such as walking, doing chores, and taking medicine. It is especially important to note what you were doing when you pushed the button to record your symptoms. This will help your health care provider determine what might be contributing to your symptoms. The information stored in your monitor will be reviewed by your health care provider alongside your diary entries.  Send the recorded information as recommended by your health care provider. It is important to understand that it will take some time for your health care provider to process the results.  Change the batteries as recommended by  your health care provider. Get help right away if:  You have chest pain.  You have extreme difficulty breathing or shortness of breath.  You develop a very fast heartbeat that persists.  You develop dizziness that does not go away.  You faint or constantly feel you are about to faint. This information is not intended to replace advice given to you by your health care provider. Make sure you discuss any questions you have with your health care provider. Document Released: 12/03/2007 Document Revised: 08/01/2015 Document Reviewed: 08/22/2012 Elsevier Interactive Patient Education  2017 [employer]vier Inc.  Echocardiogram An echocardiogram, or echocardiography, uses sound waves (ultrasound) to produce an image of your heart. The echocardiogram is simple, painless, obtained within a short period of time, and offers valuable information to your health care provider. The images from an echocardiogram can provide information such as:  Evidence of coronary artery disease (CAD).  Heart size.  Heart muscle function.  Heart valve function.  Aneurysm detection.  Evidence of a past heart  attack.  Fluid buildup around the heart.  Heart muscle thickening.  Assess heart valve function. Tell a health care provider about:  Any allergies you have.  All medicines you are taking, including vitamins, herbs, eye drops, creams, and over-the-counter medicines.  Any problems you or family members have had with anesthetic medicines.  Any blood disorders you have.  Any surgeries you have had.  Any medical conditions you have.  Whether you are pregnant or may be pregnant. What happens before the procedure? No special preparation is needed. Eat and drink normally. What happens during the procedure?  In order to produce an image of your heart, gel will be applied to your chest and a wand-like tool (transducer) will be moved over your chest. The gel will help transmit the sound waves from the  transducer. The sound waves will harmlessly bounce off your heart to allow the heart images to be captured in real-time motion. These images will then be recorded.  You may need an IV to receive a medicine that improves the quality of the pictures. What happens after the procedure? You may return to your normal schedule including diet, activities, and medicines, unless your health care provider tells you otherwise. This information is not intended to replace advice given to you by your health care provider. Make sure you discuss any questions you have with your health care provider. Document Released: 02/21/2000 Document Revised: 10/12/2015 Document Reviewed: 10/31/2012 Elsevier Interactive Patient Education  2017 [employer].   If you need a refill on your cardiac medications before your next appointment, please call your pharmacy.

## 2016-04-24 ENCOUNTER — Telehealth: Payer: Self-pay | Admitting: Physician Assistant

## 2016-04-24 NOTE — Telephone Encounter (Signed)
-----   Message from Janetta HoraKathryn R Thompson, PA-C sent at 04/24/2016  7:36 AM EST ----- Cholesterol looks perfect. No need for statin at this time. Continue other work up

## 2016-04-24 NOTE — Telephone Encounter (Signed)
Follow Up: ° ° ° °Returning your call, concerning her lab results. °

## 2016-04-24 NOTE — Telephone Encounter (Signed)
Patient made aware of results. Patient verbalizes understanding.  

## 2016-04-28 ENCOUNTER — Telehealth: Payer: Self-pay | Admitting: Physician Assistant

## 2016-04-28 NOTE — Telephone Encounter (Signed)
No answer. LMVM monitor office phone number left.  Will try again later.

## 2016-04-28 NOTE — Telephone Encounter (Signed)
New message  Pt call requesting to speak with RN about Holter monitor. Please call back to discuss

## 2016-04-29 DIAGNOSIS — Z862 Personal history of diseases of the blood and blood-forming organs and certain disorders involving the immune mechanism: Secondary | ICD-10-CM | POA: Insufficient documentation

## 2016-04-29 DIAGNOSIS — M1120 Other chondrocalcinosis, unspecified site: Secondary | ICD-10-CM | POA: Insufficient documentation

## 2016-04-29 DIAGNOSIS — Z8639 Personal history of other endocrine, nutritional and metabolic disease: Secondary | ICD-10-CM | POA: Insufficient documentation

## 2016-04-29 DIAGNOSIS — R768 Other specified abnormal immunological findings in serum: Secondary | ICD-10-CM | POA: Insufficient documentation

## 2016-04-29 DIAGNOSIS — M3501 Sicca syndrome with keratoconjunctivitis: Secondary | ICD-10-CM | POA: Insufficient documentation

## 2016-04-29 DIAGNOSIS — M359 Systemic involvement of connective tissue, unspecified: Secondary | ICD-10-CM | POA: Insufficient documentation

## 2016-04-29 DIAGNOSIS — Z79899 Other long term (current) drug therapy: Secondary | ICD-10-CM | POA: Insufficient documentation

## 2016-04-29 NOTE — Progress Notes (Signed)
Office Visit Note  Patient: Stefanie [name]             Date of Birth: Jan 31, 1961           MRN: [id number]             PCP: Gwen Pounds, MD Referring: Creola Corn, MD Visit Date: 05/12/2016 Occupation: @GUAROCC @    Subjective:  Knee pain   History of Present Illness: Stefanie [name] is a 56 y.o. female history of autoimmune disease and osteoarthritis. She states she continues to have some discomfort in her right trochanteric area in the right as she'll bursa area. She has difficulty walking she's been limping. She continues to have sicca symptoms and drinks frequent sips of water. Left CMC joint continues to hurt. She has been using CMC brace. She also has discomfort in her knee joint. She states since she's been taking supplements her knee joint discomfort as he stopped to some extent.  Activities of Daily Living:  Patient reports morning stiffness for 30 minutes.   Patient Reports nocturnal pain.  Difficulty dressing/grooming: Denies Difficulty climbing stairs: Reports Difficulty getting out of chair: Reports Difficulty using hands for taps, buttons, cutlery, and/or writing: Reports   Review of Systems  Constitutional: Positive for fatigue. Negative for night sweats, weight gain, weight loss and weakness.  HENT: Positive for mouth dryness. Negative for mouth sores, trouble swallowing, trouble swallowing and nose dryness.   Eyes: Positive for dryness. Negative for pain, redness and visual disturbance.  Respiratory: Negative for cough, shortness of breath and difficulty breathing.   Cardiovascular: Negative for chest pain, palpitations, hypertension, irregular heartbeat and swelling in legs/feet.  Gastrointestinal: Negative for blood in stool, constipation and diarrhea.  Endocrine: Negative for increased urination.  Genitourinary: Negative for vaginal dryness.  Musculoskeletal: Positive for arthralgias, joint pain and morning stiffness. Negative for joint swelling, myalgias,  muscle weakness, muscle tenderness and myalgias.  Skin: Negative for color change, rash, hair loss, skin tightness, ulcers and sensitivity to sunlight.  Allergic/Immunologic: Negative for susceptible to infections.  Neurological: Negative for dizziness, memory loss and night sweats.  Hematological: Negative for swollen glands.  Psychiatric/Behavioral: Positive for sleep disturbance. Negative for depressed mood. The patient is not nervous/anxious.     PMFS History:  Patient Active Problem List   Diagnosis Date Noted  . Primary osteoarthritis of both hands 05/11/2016  . Primary osteoarthritis of both knees 05/11/2016  . History of hypothyroidism 05/11/2016  . History of migraine 05/11/2016  . Autoimmune disease (HCC) 04/29/2016  . ANA positive 04/29/2016  . Rheumatoid factor positive 04/29/2016  . Pseudogout 04/29/2016  . Chondrocalcinosis 04/29/2016  . High risk medication use 04/29/2016  . Sjogren's syndrome with keratoconjunctivitis sicca (HCC) 04/29/2016  . History of neutropenia 04/29/2016  . 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  . Hyponatremia 12/12/2013  . Fever 12/13/2012  . Pleuritic chest pain 12/06/2012  . 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 Anxiety 12/06/2012  . ANEMIA-IRON DEFICIENCY 01/08/2010  . GASTRIC DILATION, ACUTE 01/08/2010  . NAUSEA ALONE 01/08/2010  . ABDOMINAL PAIN, LEFT UPPER QUADRANT 01/08/2010    Past Medical History:  Diagnosis Date  . Anemia   . Arthritis   . Asthma   . Diabetes insipidus (HCC)   . Fibromyalgia   . Lupus   . Mitral valve prolapse   . SLE (systemic lupus erythematosus) (HCC) 12/06/2012  . Thyroid disease     Family History  Problem Relation Age of Onset  . Diabetes Father   .  Heart disease Father   . Hyperlipidemia Father   . Hypertension Father   . Stroke Father   . Diabetes Sister   . Cancer Maternal Grandmother   . Diabetes Paternal Grandfather   . Heart disease Paternal Grandfather   . Hyperlipidemia  Paternal Grandfather   . Hypertension Paternal Grandfather    Past Surgical History:  Procedure Laterality Date  . NO PAST SURGERIES     Social History   Social History Narrative  . No narrative on file     Objective: Vital Signs: BP 120/70   Pulse 68   Resp 14   Ht 5\' 7"  (1.702 m)   Wt 145 lb (65.8 kg)   LMP 04/09/2014 (Approximate)   BMI 22.71 kg/m    Physical Exam  Constitutional: She is oriented to person, place, and time. She appears well-developed and well-nourished.  HENT:  Head: Normocephalic and atraumatic.  Eyes: Conjunctivae and EOM are normal.  Neck: Normal range of motion.  Cardiovascular: Normal rate, regular rhythm, normal heart sounds and intact distal pulses.   Pulmonary/Chest: Effort normal and breath sounds normal.  Abdominal: Soft. Bowel sounds are normal.  Lymphadenopathy:    She has no cervical adenopathy.  Neurological: She is alert and oriented to person, place, and time.  Skin: Skin is warm and dry. Capillary refill takes less than 2 seconds.  Psychiatric: She has a normal mood and affect. Her behavior is normal.  Nursing note and vitals reviewed.    Musculoskeletal Exam: C-spine and thoracic lumbar spine good range of motion. She has good range of motion of her shoulders elbow joints wrist joints. She has thickening of bilateral CMC joint PIP/DIP joints of bilateral hands. Hip joints knee joints ankles MTPs PIPs with good range of motion. She has tenderness on palpation over right trochanteric bursa and right initial bursa consistent with bursitis.  CDAI Exam: No CDAI exam completed.    Investigation: Findings:   July 2017:  White cell count was low at 3.8.  CCP was negative, 14-3-3 eta antibody was negative and hep panel was negative.  Synovial fluid showed positive calcium, pyrophosphate crystals and WBC count was 230.  February 2017:  BMP showed sodium of 134.  X-ray of lumbar spine 2 views showed levoscoliosis and multi-level  spondylosis with L3-4, L5-S1 narrowing.  Bilateral knee joint 2 view x-ray showed bilateral moderate medial compartment narrowing, more so on the left than the right, and bilateral moderate patellofemoral narrowing consistent with moderate osteoarthritis and moderate chondromalacia patella.  June 2017:  Her white cell count was low at 2.5.  Hemoglobin 11.1.  Comprehensive metabolic panel showed sodium of 124.  ANA was 1:160 nuclear speckled.  Complements were normal double-stranded DNA.  Anticardiolipin antibodies were within normal limits.  Vitamin D was 30.    Imaging: No results found.  Speciality Comments: No specialty comments available.    Procedures:  No procedures performed Allergies: Venofer [ferric oxide] and Azithromycin   Assessment / Plan:     Visit Diagnoses: Autoimmune disease (HCC) -  positive ANA, positive Ro, positive La, anticardiolipin antibody and positive rheumatoid factor - Plan: Urinalysis, Routine w reflex microscopic  Sjogren's syndrome with keratoconjunctivitis sicca (HCC) she continues to have sicca symptoms.- Plan: Rheumatoid factor, Protein electrophoresis, serum  High risk medication use - plaquenil 200 mg 300 mg po qd - Plan: CBC with Differential/Platelet, COMPLETE METABOLIC PANEL WITH GFR  Pseudogout: Doing better on colchicine. She requests refills for colchicine noted that today.  Primary osteoarthritis of  both hands: Chronic pain especially in her CMC joint. She's been using brace which is been helpful yet.  Primary osteoarthritis of both knees - And chondromalacia patella  Trochanteric bursitis of right hip: Will refer to physical therapy  Right is she'll bursitis which is causing discomfort as well. I offered injection she declined. I will refer her to physical therapy for that as well.  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 CBC with Differential/Platelet  . COMPLETE METABOLIC PANEL WITH GFR  . Rheumatoid factor  . Protein electrophoresis, serum  . Urinalysis, Routine w reflex microscopic  . Ambulatory referral to Physical Therapy   Meds ordered this encounter  Medications  . Colchicine 0.6 MG CAPS    Sig: Take 1 capsule by mouth daily.    Dispense:  90 capsule    Refill:  1    Face-to-face time spent with patient was 30 minutes. 50% of time was spent in counseling and coordination of care.  Follow-Up Instructions: Return in about 5 months (around 10/12/2016) for Autoimmune disease.   Pollyann Savoy, MD  Note - This record has been created using Animal nutritionist.  Chart creation errors have been sought, but may not always  have been located. Such creation errors do not reflect on  the standard of medical care.

## 2016-05-06 ENCOUNTER — Telehealth (HOSPITAL_COMMUNITY): Payer: Self-pay | Admitting: *Deleted

## 2016-05-06 NOTE — Telephone Encounter (Signed)
Left message on voicemail in reference to upcoming appointment scheduled for 05/08/16. Phone number given for a call back so details instructions can be given. Antionette CharMary J Gifford Ballon, RN

## 2016-05-07 ENCOUNTER — Encounter (HOSPITAL_COMMUNITY): Payer: Managed Care, Other (non HMO)

## 2016-05-07 ENCOUNTER — Other Ambulatory Visit (HOSPITAL_COMMUNITY): Payer: Managed Care, Other (non HMO)

## 2016-05-08 ENCOUNTER — Ambulatory Visit (HOSPITAL_COMMUNITY): Payer: 59 | Attending: Physician Assistant

## 2016-05-08 ENCOUNTER — Other Ambulatory Visit: Payer: Self-pay

## 2016-05-08 ENCOUNTER — Ambulatory Visit (HOSPITAL_BASED_OUTPATIENT_CLINIC_OR_DEPARTMENT_OTHER): Payer: 59

## 2016-05-08 DIAGNOSIS — R079 Chest pain, unspecified: Secondary | ICD-10-CM | POA: Diagnosis not present

## 2016-05-08 DIAGNOSIS — R002 Palpitations: Secondary | ICD-10-CM | POA: Insufficient documentation

## 2016-05-08 DIAGNOSIS — I340 Nonrheumatic mitral (valve) insufficiency: Secondary | ICD-10-CM | POA: Insufficient documentation

## 2016-05-08 LAB — MYOCARDIAL PERFUSION IMAGING
CHL CUP RESTING HR STRESS: 71 {beats}/min
CSEPPHR: 116 {beats}/min
LVDIAVOL: 66 mL (ref 46–106)
LVSYSVOL: 23 mL
RATE: 0.32
SDS: 0
SRS: 1
SSS: 1
TID: 1.06

## 2016-05-08 LAB — ECHOCARDIOGRAM COMPLETE
Ao-asc: 28 cm
CHL CUP RV SYS PRESS: 26 mmHg
E decel time: 261 msec
EERAT: 11.06
FS: 31 % (ref 28–44)
IVS/LV PW RATIO, ED: 1
LA ID, A-P, ES: 31 mm
LA vol index: 20.7 mL/m2
LA vol: 36 mL
LADIAMINDEX: 1.78 cm/m2
LAVOLA4C: 36 mL
LDCA: 2.54 cm2
LEFT ATRIUM END SYS DIAM: 31 mm
LV E/e'average: 11.06
LV PW d: 10.5 mm — AB (ref 0.6–1.1)
LV SIMPSON'S DISK: 56
LV TDI E'LATERAL: 8.97
LV dias vol index: 33 mL/m2
LV dias vol: 57 mL (ref 46–106)
LV sys vol index: 14 mL/m2
LVEEMED: 11.06
LVELAT: 8.97 cm/s
LVOT SV: 52 mL
LVOT VTI: 20.4 cm
LVOT diameter: 18 mm
LVOT peak grad rest: 4 mmHg
LVOT peak vel: 95.5 cm/s
LVSYSVOL: 25 mL (ref 14–42)
MV Dec: 261
MV Peak grad: 4 mmHg
MVPKAVEL: 85.4 m/s
MVPKEVEL: 99.2 m/s
RV LATERAL S' VELOCITY: 9.65 cm/s
Reg peak vel: 238 cm/s
Stroke v: 32 ml
TDI e' medial: 8.68
TR max vel: 238 cm/s

## 2016-05-08 MED ORDER — TECHNETIUM TC 99M TETROFOSMIN IV KIT
30.8000 | PACK | Freq: Once | INTRAVENOUS | Status: AC | PRN
  Administered 2016-05-08: 30.8 via INTRAVENOUS
  Filled 2016-05-08: qty 31

## 2016-05-08 MED ORDER — TECHNETIUM TC 99M TETROFOSMIN IV KIT
10.2000 | PACK | Freq: Once | INTRAVENOUS | Status: AC | PRN
  Administered 2016-05-08: 10.2 via INTRAVENOUS
  Filled 2016-05-08: qty 11

## 2016-05-08 MED ORDER — REGADENOSON 0.4 MG/5ML IV SOLN
0.4000 mg | Freq: Once | INTRAVENOUS | Status: AC
  Administered 2016-05-08: 0.4 mg via INTRAVENOUS

## 2016-05-11 DIAGNOSIS — Z8669 Personal history of other diseases of the nervous system and sense organs: Secondary | ICD-10-CM | POA: Insufficient documentation

## 2016-05-11 DIAGNOSIS — Z8639 Personal history of other endocrine, nutritional and metabolic disease: Secondary | ICD-10-CM | POA: Insufficient documentation

## 2016-05-11 DIAGNOSIS — M19041 Primary osteoarthritis, right hand: Secondary | ICD-10-CM | POA: Insufficient documentation

## 2016-05-11 DIAGNOSIS — M170 Bilateral primary osteoarthritis of knee: Secondary | ICD-10-CM | POA: Insufficient documentation

## 2016-05-11 DIAGNOSIS — M19042 Primary osteoarthritis, left hand: Secondary | ICD-10-CM

## 2016-05-12 ENCOUNTER — Encounter: Payer: Self-pay | Admitting: Rheumatology

## 2016-05-12 ENCOUNTER — Ambulatory Visit (INDEPENDENT_AMBULATORY_CARE_PROVIDER_SITE_OTHER): Payer: Managed Care, Other (non HMO) | Admitting: Rheumatology

## 2016-05-12 VITALS — BP 120/70 | HR 68 | Resp 14 | Ht 67.0 in | Wt 145.0 lb

## 2016-05-12 DIAGNOSIS — M1120 Other chondrocalcinosis, unspecified site: Secondary | ICD-10-CM

## 2016-05-12 DIAGNOSIS — M359 Systemic involvement of connective tissue, unspecified: Secondary | ICD-10-CM

## 2016-05-12 DIAGNOSIS — M19041 Primary osteoarthritis, right hand: Secondary | ICD-10-CM | POA: Diagnosis not present

## 2016-05-12 DIAGNOSIS — M3501 Sicca syndrome with keratoconjunctivitis: Secondary | ICD-10-CM

## 2016-05-12 DIAGNOSIS — M7071 Other bursitis of hip, right hip: Secondary | ICD-10-CM | POA: Diagnosis not present

## 2016-05-12 DIAGNOSIS — Z79899 Other long term (current) drug therapy: Secondary | ICD-10-CM

## 2016-05-12 DIAGNOSIS — Z8669 Personal history of other diseases of the nervous system and sense organs: Secondary | ICD-10-CM

## 2016-05-12 DIAGNOSIS — Z8639 Personal history of other endocrine, nutritional and metabolic disease: Secondary | ICD-10-CM

## 2016-05-12 DIAGNOSIS — M7061 Trochanteric bursitis, right hip: Secondary | ICD-10-CM

## 2016-05-12 DIAGNOSIS — M19042 Primary osteoarthritis, left hand: Secondary | ICD-10-CM

## 2016-05-12 DIAGNOSIS — M170 Bilateral primary osteoarthritis of knee: Secondary | ICD-10-CM

## 2016-05-12 LAB — CBC WITH DIFFERENTIAL/PLATELET
BASOS PCT: 0 %
Basophils Absolute: 0 cells/uL (ref 0–200)
EOS ABS: 94 {cells}/uL (ref 15–500)
Eosinophils Relative: 2 %
HCT: 37.2 % (ref 35.0–45.0)
Hemoglobin: 12.5 g/dL (ref 11.7–15.5)
Lymphocytes Relative: 32 %
Lymphs Abs: 1504 cells/uL (ref 850–3900)
MCH: 28.6 pg (ref 27.0–33.0)
MCHC: 33.6 g/dL (ref 32.0–36.0)
MCV: 85.1 fL (ref 80.0–100.0)
MONO ABS: 752 {cells}/uL (ref 200–950)
MPV: 9.9 fL (ref 7.5–12.5)
Monocytes Relative: 16 %
NEUTROS ABS: 2350 {cells}/uL (ref 1500–7800)
Neutrophils Relative %: 50 %
Platelets: 238 10*3/uL (ref 140–400)
RBC: 4.37 MIL/uL (ref 3.80–5.10)
RDW: 14.2 % (ref 11.0–15.0)
WBC: 4.7 10*3/uL (ref 3.8–10.8)

## 2016-05-12 LAB — COMPLETE METABOLIC PANEL WITH GFR
ALK PHOS: 99 U/L (ref 33–130)
ALT: 17 U/L (ref 6–29)
AST: 25 U/L (ref 10–35)
Albumin: 4.1 g/dL (ref 3.6–5.1)
BUN: 14 mg/dL (ref 7–25)
CHLORIDE: 99 mmol/L (ref 98–110)
CO2: 23 mmol/L (ref 20–31)
Calcium: 9.3 mg/dL (ref 8.6–10.4)
Creat: 0.68 mg/dL (ref 0.50–1.05)
GFR, Est African American: >89 mL/min (ref >=60)
GLUCOSE: 83 mg/dL (ref 65–99)
POTASSIUM: 4.3 mmol/L (ref 3.5–5.3)
SODIUM: 133 mmol/L — AB (ref 135–146)
Total Bilirubin: 0.5 mg/dL (ref 0.2–1.2)
Total Protein: 7.5 g/dL (ref 6.1–8.1)

## 2016-05-12 MED ORDER — COLCHICINE 0.6 MG PO CAPS: 1.0000 | ORAL_CAPSULE | Freq: Every day | ORAL | 1 refills | Status: DC

## 2016-05-12 NOTE — Patient Instructions (Signed)
Supplements for OA Natural anti-inflammatories  You can purchase these at [employer], [employer] or online.  . Turmeric (capsules)  . Ginger (ginger root or capsules)  . Omega 3 (Fish, flax seeds, chia seeds, walnuts, almonds)  . Tart cherry (dried or extract)   Patient should be under the care of a physician while taking these supplements. This may not be reproduced without the permission of Dr. Kairy Folsom.  

## 2016-05-13 LAB — URINALYSIS, ROUTINE W REFLEX MICROSCOPIC
Bilirubin Urine: NEGATIVE
Glucose, UA: NEGATIVE
HGB URINE DIPSTICK: NEGATIVE
Ketones, ur: NEGATIVE
Leukocytes, UA: NEGATIVE
NITRITE: NEGATIVE
PROTEIN: NEGATIVE
Specific Gravity, Urine: 1.02 (ref 1.001–1.035)
pH: 6.5 (ref 5.0–8.0)

## 2016-05-13 LAB — RHEUMATOID FACTOR: RHEUMATOID FACTOR: 26 [IU]/mL — AB (ref <14)

## 2016-05-14 LAB — PROTEIN ELECTROPHORESIS, SERUM
ALBUMIN ELP: 4 g/dL (ref 3.8–4.8)
ALPHA-2-GLOBULIN: 0.7 g/dL (ref 0.5–0.9)
Alpha-1-Globulin: 0.3 g/dL (ref 0.2–0.3)
BETA GLOBULIN: 0.4 g/dL (ref 0.4–0.6)
Beta 2: 0.4 g/dL (ref 0.2–0.5)
Gamma Globulin: 1.6 g/dL (ref 0.8–1.7)
Total Protein, Serum Electrophoresis: 7.5 g/dL (ref 6.1–8.1)

## 2016-05-14 NOTE — Progress Notes (Signed)
Labs stable

## 2016-05-18 ENCOUNTER — Encounter: Payer: Self-pay | Admitting: *Deleted

## 2016-05-19 ENCOUNTER — Telehealth: Payer: Self-pay | Admitting: *Deleted

## 2016-05-19 NOTE — Telephone Encounter (Signed)
Pt requested to [name], PA-C, that I call her at the daycare. Called pt and discussed her Echo and Myoview. Pt verbalized understanding.

## 2016-06-30 ENCOUNTER — Other Ambulatory Visit: Payer: Self-pay | Admitting: Internal Medicine

## 2016-06-30 DIAGNOSIS — N6012 Diffuse cystic mastopathy of left breast: Secondary | ICD-10-CM

## 2016-07-15 ENCOUNTER — Encounter: Payer: Self-pay | Admitting: Cardiovascular Disease

## 2016-07-24 ENCOUNTER — Other Ambulatory Visit: Payer: Self-pay | Admitting: *Deleted

## 2016-07-24 MED ORDER — HYDROXYCHLOROQUINE SULFATE 200 MG PO TABS: ORAL_TABLET | ORAL | 2 refills | Status: DC

## 2016-07-24 NOTE — Telephone Encounter (Signed)
Last Visit: 05/12/16 Next Visit: 10/13/16 Labs: 05/12/16 Stable  PLQ Eye Exam: 10/28/15 WNL   Okay to refill per Dr. Corliss Skainseveshwar

## 2016-07-28 ENCOUNTER — Encounter (HOSPITAL_COMMUNITY): Payer: Self-pay | Admitting: Emergency Medicine

## 2016-07-28 ENCOUNTER — Inpatient Hospital Stay (HOSPITAL_COMMUNITY)
Admission: EM | Admit: 2016-07-28 | Discharge: 2016-07-30 | DRG: 641 | Disposition: A | Discharge status: Home / Self Care | Payer: 59 | Attending: Internal Medicine | Admitting: Internal Medicine

## 2016-07-28 ENCOUNTER — Observation Stay (HOSPITAL_COMMUNITY): Payer: 59

## 2016-07-28 DIAGNOSIS — J0130 Acute sphenoidal sinusitis, unspecified: Secondary | ICD-10-CM | POA: Diagnosis present

## 2016-07-28 DIAGNOSIS — E232 Diabetes insipidus: Secondary | ICD-10-CM | POA: Diagnosis not present

## 2016-07-28 DIAGNOSIS — R9431 Abnormal electrocardiogram [ECG] [EKG]: Secondary | ICD-10-CM | POA: Diagnosis present

## 2016-07-28 DIAGNOSIS — E063 Autoimmune thyroiditis: Secondary | ICD-10-CM | POA: Diagnosis not present

## 2016-07-28 DIAGNOSIS — R29810 Facial weakness: Secondary | ICD-10-CM | POA: Diagnosis not present

## 2016-07-28 DIAGNOSIS — M329 Systemic lupus erythematosus, unspecified: Secondary | ICD-10-CM | POA: Diagnosis present

## 2016-07-28 DIAGNOSIS — E861 Hypovolemia: Secondary | ICD-10-CM | POA: Diagnosis present

## 2016-07-28 DIAGNOSIS — E871 Hypo-osmolality and hyponatremia: Secondary | ICD-10-CM | POA: Diagnosis present

## 2016-07-28 DIAGNOSIS — H16209 Unspecified keratoconjunctivitis, unspecified eye: Secondary | ICD-10-CM | POA: Diagnosis present

## 2016-07-28 DIAGNOSIS — M3500 Sicca syndrome, unspecified: Secondary | ICD-10-CM | POA: Diagnosis present

## 2016-07-28 DIAGNOSIS — I4581 Long QT syndrome: Secondary | ICD-10-CM | POA: Diagnosis present

## 2016-07-28 DIAGNOSIS — E039 Hypothyroidism, unspecified: Secondary | ICD-10-CM | POA: Diagnosis present

## 2016-07-28 DIAGNOSIS — Z79899 Other long term (current) drug therapy: Secondary | ICD-10-CM

## 2016-07-28 DIAGNOSIS — M3501 Sicca syndrome with keratoconjunctivitis: Secondary | ICD-10-CM

## 2016-07-28 LAB — CBC WITH DIFFERENTIAL/PLATELET
BASOS PCT: 0 %
Basophils Absolute: 0 10*3/uL (ref 0.0–0.1)
EOS ABS: 0 10*3/uL (ref 0.0–0.7)
Eosinophils Relative: 0 %
HCT: 37.5 % (ref 36.0–46.0)
Hemoglobin: 13.6 g/dL (ref 12.0–15.0)
Lymphocytes Relative: 22 %
Lymphs Abs: 0.7 10*3/uL (ref 0.7–4.0)
MCH: 28.9 pg (ref 26.0–34.0)
MCHC: 36.3 g/dL — AB (ref 30.0–36.0)
MCV: 79.6 fL (ref 78.0–100.0)
MONO ABS: 0.5 10*3/uL (ref 0.1–1.0)
MONOS PCT: 15 %
Neutro Abs: 2.1 10*3/uL (ref 1.7–7.7)
Neutrophils Relative %: 63 %
Platelets: 240 10*3/uL (ref 150–400)
RBC: 4.71 MIL/uL (ref 3.87–5.11)
RDW: 12.8 % (ref 11.5–15.5)
WBC: 3.3 10*3/uL — ABNORMAL LOW (ref 4.0–10.5)

## 2016-07-28 LAB — OSMOLALITY: Osmolality: 245 mOsm/kg — CL (ref 275–295)

## 2016-07-28 LAB — BASIC METABOLIC PANEL
ANION GAP: 9 (ref 5–15)
BUN: 9 mg/dL (ref 6–20)
CHLORIDE: 85 mmol/L — AB (ref 101–111)
CO2: 20 mmol/L — AB (ref 22–32)
CREATININE: 0.63 mg/dL (ref 0.44–1.00)
Calcium: 9.7 mg/dL (ref 8.9–10.3)
GFR calc non Af Amer: >60 mL/min (ref >60)
Glucose, Bld: 97 mg/dL (ref 65–99)
POTASSIUM: 4.2 mmol/L (ref 3.5–5.1)
SODIUM: 114 mmol/L — AB (ref 135–145)

## 2016-07-28 LAB — URINALYSIS, ROUTINE W REFLEX MICROSCOPIC
Bilirubin Urine: NEGATIVE
Glucose, UA: NEGATIVE mg/dL
Hgb urine dipstick: NEGATIVE
Ketones, ur: NEGATIVE mg/dL
Leukocytes, UA: NEGATIVE
Nitrite: NEGATIVE
PH: 7 (ref 5.0–8.0)
Protein, ur: NEGATIVE mg/dL
SPECIFIC GRAVITY, URINE: 1.009 (ref 1.005–1.030)

## 2016-07-28 LAB — I-STAT TROPONIN, ED: TROPONIN I, POC: 0 ng/mL (ref 0.00–0.08)

## 2016-07-28 LAB — OSMOLALITY, URINE: OSMOLALITY UR: 359 mosm/kg (ref 300–900)

## 2016-07-28 LAB — SODIUM, URINE, RANDOM: SODIUM UR: 82 mmol/L

## 2016-07-28 MED ORDER — COLCHICINE 0.6 MG PO TABS
0.6000 mg | ORAL_TABLET | Freq: Every day | ORAL | Status: DC
  Administered 2016-07-29 – 2016-07-30 (×2): 0.6 mg via ORAL
  Filled 2016-07-28 (×2): qty 1

## 2016-07-28 MED ORDER — LORAZEPAM 2 MG/ML IJ SOLN
0.5000 mg | Freq: Once | INTRAMUSCULAR | Status: AC
  Administered 2016-07-28: 0.5 mg via INTRAVENOUS
  Filled 2016-07-28: qty 1

## 2016-07-28 MED ORDER — DIPHENHYDRAMINE HCL 50 MG/ML IJ SOLN: 25.0000 mg | Freq: Once | INTRAMUSCULAR | Status: DC

## 2016-07-28 MED ORDER — ACETAMINOPHEN 650 MG RE SUPP: 650.0000 mg | Freq: Four times a day (QID) | RECTAL | Status: DC | PRN

## 2016-07-28 MED ORDER — ENOXAPARIN SODIUM 40 MG/0.4ML ~~LOC~~ SOLN
40.0000 mg | SUBCUTANEOUS | Status: DC
  Administered 2016-07-28 – 2016-07-29 (×2): 40 mg via SUBCUTANEOUS
  Filled 2016-07-28 (×2): qty 0.4

## 2016-07-28 MED ORDER — LEVOTHYROXINE SODIUM 25 MCG PO TABS
125.0000 ug | ORAL_TABLET | Freq: Every day | ORAL | Status: DC
  Administered 2016-07-29 – 2016-07-30 (×2): 125 ug via ORAL
  Filled 2016-07-28 (×2): qty 1

## 2016-07-28 MED ORDER — SODIUM CHLORIDE 0.9 % IV BOLUS (SEPSIS)
500.0000 mL | Freq: Once | INTRAVENOUS | Status: AC
  Administered 2016-07-28: 500 mL via INTRAVENOUS

## 2016-07-28 MED ORDER — METOCLOPRAMIDE HCL 5 MG/ML IJ SOLN: 10.0000 mg | Freq: Once | INTRAMUSCULAR | Status: DC

## 2016-07-28 MED ORDER — SODIUM CHLORIDE 0.9% FLUSH: 3.0000 mL | Freq: Two times a day (BID) | INTRAVENOUS | Status: DC

## 2016-07-28 MED ORDER — LORAZEPAM 0.5 MG PO TABS: 0.5000 mg | ORAL_TABLET | Freq: Once | ORAL | Status: DC | PRN

## 2016-07-28 MED ORDER — HYDROXYCHLOROQUINE SULFATE 200 MG PO TABS
200.0000 mg | ORAL_TABLET | Freq: Every day | ORAL | Status: DC
  Administered 2016-07-29 – 2016-07-30 (×2): 200 mg via ORAL
  Filled 2016-07-28 (×2): qty 1

## 2016-07-28 MED ORDER — ACETAMINOPHEN 325 MG PO TABS: 650.0000 mg | ORAL_TABLET | Freq: Four times a day (QID) | ORAL | Status: DC | PRN

## 2016-07-28 MED ORDER — SODIUM CHLORIDE 0.9 % IV SOLN
INTRAVENOUS | Status: DC
Start: 2016-07-28 — End: 2016-07-29
  Administered 2016-07-28: 23:00:00 via INTRAVENOUS

## 2016-07-28 NOTE — ED Notes (Signed)
Dr. Madilyn Hookees informed via telephone of pts serum osmolality level of 245. No new verbal orders at this time

## 2016-07-28 NOTE — H&P (Signed)
History and Physical  Patient Name: Stefanie [name]     ZOX:[id number]    DOB: 1960-03-18    DOA: 07/28/2016 PCP: Creola Corn, MD   Patient coming from: Home --> PCP's office  Chief Complaint: Weakness, malaise  HPI: BERNARDETTE [name] is a 56 y.o. 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  The patient was in her usual state of health until two weeks ago she had her lower teeth removed for dentures. Post-operatively, she never really bounced back, has had fatigue, very poor PO intake (small amounts of pureed food only).  In the last 4 days, she has had new headache, worse than her usual migraines, and worse fatigue, cramps, so today she went to see her PCP where she was found to have Na 118 and sent to the ER.  She took opiates only in the first 24 hours post-op, has been taking a few NSAIDs over the weekend for her headaches, no other new medicines.  No LOC, seizures, confusion.   ED course: -Afebrile, heart rate 71, respirations pulse ox normal, blood pressure 147/79 -Na 114, K 4.2, Cr 0.63, WBC 3.3K, Hgb 13.6 -Serum osmolality 245 -ECG showed prolonged QT interval -She was given 500 mL normal saline and TRH was asked to evaluate for hyponatremia         ROS: Review of Systems  Constitutional: Positive for malaise/fatigue. Negative for chills and fever.  Eyes: Negative for blurred vision, double vision and photophobia.  Musculoskeletal: Positive for neck pain.  Neurological: Positive for tremors, weakness and headaches. Negative for dizziness, tingling, sensory change, speech change, focal weakness, seizures and loss of consciousness.  All other systems reviewed and are negative.         Past Medical History:  Diagnosis Date  . Anemia   . Arthritis   . Asthma   . Diabetes insipidus (HCC)   . Fibromyalgia   . Lupus    . Mitral valve prolapse   . SLE (systemic lupus erythematosus) (HCC) 12/06/2012  . Thyroid disease     Past Surgical History:  Procedure Laterality Date  . 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  She is from [place], went to [place].  The patient walks unassisted.  She works at the [employer] center.  She is a never smoker.    Allergies  Allergen Reactions  . Venofer [Ferric Oxide] Palpitations    Dizziness, sweating, "felt like I was going to pass out" unknown when episode happened, itching  . Azithromycin Other (See Comments)    "couldnt sleep"    Family history: family history includes Cancer in her maternal grandmother; Diabetes in her father, paternal grandfather, and sister; Heart disease in her father and paternal grandfather; Hyperlipidemia in her father and paternal grandfather; Hypertension in her father and paternal grandfather; Stroke in her father.  Prior to Admission medications   Medication Sig Start Date End Date Taking? Authorizing Provider  albuterol (PROVENTIL HFA;VENTOLIN HFA) 108 (90 BASE) MCG/ACT inhaler Inhale 1 puff into the lungs every 6 (six) hours as needed for wheezing or shortness of breath.   Yes [provider]  Cholecalciferol (VITAMIN D PO) Take 1 capsule by mouth daily.   Yes [provider]  Colchicine 0.6 MG CAPS Take 1 capsule by mouth daily. 05/12/16  Yes Deveshwar, Janalyn Rouse, MD  desmopressin (DDAVP RHINAL TUBE) 0.01 % SOLN Place 1 spray into the nose every other day. At bedtime   Yes [provider]  diclofenac sodium (VOLTAREN) 1 % GEL Voltaren Gel 3 grams to 3 large joints upto TID 3 TUBES with 3 refills 03/30/16  Yes Panwala, Naitik, PA-C  hydroxychloroquine (PLAQUENIL) 200 MG tablet TAKE 1 TABLET EVERY MORNING AND 1/2 TABLET EVERY EVENING Patient taking differently: Take 200 mg by mouth daily.  07/24/16  Yes Deveshwar, Janalyn Rouse, MD  ibuprofen  (ADVIL,MOTRIN) 200 MG tablet Take 400 mg by mouth every 6 (six) hours as needed for pain.   Yes [provider]  levothyroxine (SYNTHROID, LEVOTHROID) 125 MCG tablet Take 125 mcg by mouth at bedtime.  06/14/14  Yes [provider]  rizatriptan (MAXALT) 10 MG tablet Take 10 mg by mouth daily as needed for migraine. May repeat in 2 hours if needed   Yes [provider]  topiramate (TOPAMAX) 100 MG tablet Take 100 mg by mouth daily as needed. Migraines   Yes [provider]  Turmeric 450 MG CAPS Take by mouth.   Yes [provider]  nitroGLYCERIN (NITROSTAT) 0.4 MG SL tablet Place 0.4 mg under the tongue every 5 (five) minutes as needed for chest pain.    [provider]       Physical Exam: BP 101/66 (BP Location: Right Arm)   Pulse 65   Temp 98.6 F (37 C) (Oral)   Resp 17   LMP 04/09/2014 (Approximate)   SpO2 99%  General appearance: Thin elderly adult female, alert and in mild distress from malaise, headache.   Eyes: Anicteric, conjunctiva pink, lids and lashes normal. PERRL.    ENT: No nasal deformity, discharge, epistaxis.  Hearing normal. OP tacky dry without lesions.   Neck: No neck masses.  Trachea midline.  No thyromegaly/tenderness. Lymph: No cervical or supraclavicular lymphadenopathy. Skin: Warm and very dry.  No jaundice.  Pink across malar area, no other rashes. Cardiac: RRR, nl S1-S2, no murmurs appreciated.  Capillary refill is brisk.  JVP not visible.  No LE edema.  Radial and DP pulses 2+ and symmetric. Respiratory: Normal respiratory rate and rhythm.  CTAB without rales or wheezes. Abdomen: Abdomen soft.  No TTP. No ascites, distension, hepatosplenomegaly.   MSK: No deformities or effusions.  No cyanosis or clubbing. Neuro: Pupils are 3 mm and reactive to 2 mm.  Extraocular movements are intact, without nystagmus.  Cranial nerve 5 is within normal limits.  Cranial nerve 7 shows right lip droop.  Cranial nerve 8 is  within normal limits.  Cranial nerves 9 and 10 reveal equal palate elevation.  Cranial nerve 11 reveals sternocleidomastoid strong.  Cranial nerve 12 is midline.  Motor strength testing is 5/5 in the upper and lower extremities bilaterally with normal motor, tone and bulk.  Sensory examination is intact to light touch.  No pronator drift.  Finger-to-nose testing is within normal limits. The patient is oriented to time, place and person.  Speech is fluent.  Naming is grossly intact.  Recall, recent and remote, as well as general fund of knowledge seem within normal limits.  Attention span and concentration are within normal limits. Psych: Sensorium intact and responding to questions, attention normal.  Behavior appropriate.  Affect blunted.  Judgment and insight appear normal.     Labs on Admission:  I have personally reviewed following labs and imaging studies: CBC:  Recent Labs Lab 07/28/16 1730  WBC 3.3*  NEUTROABS 2.1  HGB 13.6  HCT 37.5  MCV 79.6  PLT 240   Basic Metabolic Panel:  Recent Labs Lab 07/28/16 1730  NA 114*  K 4.2  CL 85*  CO2 20*  GLUCOSE 97  BUN 9  CREATININE 0.63  CALCIUM 9.7   GFR: CrCl cannot be calculated (Unknown ideal weight.).  Liver Function Tests: No results for input(s): AST, ALT, ALKPHOS, BILITOT, PROT, ALBUMIN in the last 168 hours. No results for input(s): LIPASE, AMYLASE in the last 168 hours. No results for input(s): AMMONIA in the last 168 hours. Coagulation Profile: No results for input(s): INR, PROTIME in the last 168 hours. Cardiac Enzymes: No results for input(s): CKTOTAL, CKMB, CKMBINDEX, TROPONINI in the last 168 hours. BNP (last 3 results) No results for input(s): PROBNP in the last 8760 hours. HbA1C: No results for input(s): HGBA1C in the last 72 hours. CBG: No results for input(s): GLUCAP in the last 168 hours. Lipid Profile: No results for input(s): CHOL, HDL, LDLCALC, TRIG, CHOLHDL, LDLDIRECT in the last 72  hours. Thyroid Function Tests: No results for input(s): TSH, T4TOTAL, FREET4, T3FREE, THYROIDAB in the last 72 hours. Anemia Panel: No results for input(s): VITAMINB12, FOLATE, FERRITIN, TIBC, IRON, RETICCTPCT in the last 72 hours. Sepsis Labs: Invalid input(s): PROCALCITONIN, LACTICIDVEN No results found for this or any previous visit (from the past 240 hour(s)).        EKG: Independently reviewed. Rate 71, QTc 516.           Assessment/Plan  1. Hyponatremia:  Insetting of poor by mouth intake, clinical hypovolemia. Suspect that she would auto correct overnight if DDAVP were held alone, but given history of poor intake and clinical hypovolemia will hydrate very gently and check sodiums overnight.   -Hold DDAVP -Normal saline 75 mL per hour -Check sodium every 4 hours -Fluid restriction 1.5L -Reglan and diphenhdyramine once for headache    2. Right facial droop:  Patient did not note.  Unsure clinical significance, but feel compelled to image. -MR brain ordered  3. Prolonged QT interval:  -Limit QT prolonging medicines -Monitor on tele -Repeat ECG in AM  4. SLE:  -Continue plaquenil  5. Hypothyroidism:  -Continue levothyroxine  6. CPPD:  -Continue colchicine          DVT prophylaxis: Lovenox  Code Status: FULL  Family Communication: Husband at bedside  Disposition Plan: Anticipate IV fluids, hold DDAVP, monitor Na overnight. Consults called: None Admission status: OBS At the point of initial evaluation, it is my clinical opinion that admission for OBSERVATION is reasonable and necessary because the patient's presenting complaints in the context of their chronic conditions represent sufficient risk of deterioration or significant morbidity to constitute reasonable grounds for close observation in the hospital setting, but that the patient may be medically stable for discharge from the hospital within 24 to 48 hours.    Medical decision making: Patient  seen at 7:25 PM on 07/28/2016.  The patient was discussed with Dr. Madilyn Hookees.  What exists of the patient's chart was reviewed in depth and summarized above.  Clinical condition: stable.        Alberteen SamChristopher P Isador Castille [facility] Pager [phone]

## 2016-07-28 NOTE — ED Triage Notes (Signed)
Pt here from PCP with sodium of 118; pt with hx of same and had recent dental sx and unable to eat well; pt sts weakness and cramping

## 2016-07-28 NOTE — ED Provider Notes (Signed)
[facility]-EMERGENCY DEPT Provider Note   CSN: [id number] Arrival date & time: 07/28/16  1303     History   Chief Complaint Chief Complaint  Patient presents with  . Abnormal Lab    HPI Stefanie [name] is a 56 y.o. female.  The history is provided by the patient. No language interpreter was used.  Abnormal Lab    Stefanie [name] is a 56 y.o. female who presents to the Emergency Department complaining of abnormal lab.  She was referred to the emergency department today for abnormal lab. In early May she had her lower teeth pulled this had difficulty with eating since then. Over the last several days she's began to feel poorly with cramping in her arms and legs as well as nausea and headaches. No fevers, vomiting, shortness of breath, dysuria. She did have some mild chest pain yesterday, now resolved. She saw her PCP for these symptoms today and had labs performed as an outpatient that demonstrated a sodium of 118 and she was referred to the ED for further management.  Past Medical History:  Diagnosis Date  . Anemia   . Arthritis   . Asthma   . Diabetes insipidus (HCC)   . Fibromyalgia   . Lupus   . Mitral valve prolapse   . SLE (systemic lupus erythematosus) (HCC) 12/06/2012  . Thyroid disease     Patient Active Problem List   Diagnosis Date Noted  . Primary osteoarthritis of both hands 05/11/2016  . Primary osteoarthritis of both knees 05/11/2016  . History of hypothyroidism 05/11/2016  . History of migraine 05/11/2016  . Autoimmune disease (HCC) 04/29/2016  . ANA positive 04/29/2016  . Rheumatoid factor positive 04/29/2016  . Pseudogout 04/29/2016  . Chondrocalcinosis 04/29/2016  . High risk medication use 04/29/2016  . Sjogren's syndrome with keratoconjunctivitis sicca (HCC) 04/29/2016  . History of neutropenia 04/29/2016  . 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  . Hyponatremia 12/12/2013  . Fever 12/13/2012  . Pleuritic chest pain 12/06/2012  . DI (diabetes  insipidus) (HCC) 12/06/2012  . Anxiety 12/06/2012  . ANEMIA-IRON DEFICIENCY 01/08/2010  . GASTRIC DILATION, ACUTE 01/08/2010  . NAUSEA ALONE 01/08/2010  . ABDOMINAL PAIN, LEFT UPPER QUADRANT 01/08/2010    Past Surgical History:  Procedure Laterality Date  . NO PAST SURGERIES      OB History    No data available       Home Medications    Prior to Admission medications   Medication Sig Start Date End Date Taking? Authorizing Provider  albuterol (PROVENTIL HFA;VENTOLIN HFA) 108 (90 BASE) MCG/ACT inhaler Inhale 1 puff into the lungs every 6 (six) hours as needed for wheezing or shortness of breath.    [provider]  Cholecalciferol (VITAMIN D PO) Take 1 capsule by mouth daily.    [provider]  Colchicine 0.6 MG CAPS Take 1 capsule by mouth daily. 05/12/16   Pollyann Savoy, MD  desmopressin (DDAVP RHINAL TUBE) 0.01 % SOLN Place 1 spray into the nose every other day. At bedtime    [provider]  diclofenac sodium (VOLTAREN) 1 % GEL Voltaren Gel 3 grams to 3 large joints upto TID 3 TUBES with 3 refills 03/30/16   Panwala, Naitik, PA-C  hydroxychloroquine (PLAQUENIL) 200 MG tablet TAKE 1 TABLET EVERY MORNING AND 1/2 TABLET EVERY EVENING 07/24/16   Pollyann Savoy, MD  ibuprofen (ADVIL,MOTRIN) 200 MG tablet Take 400 mg by mouth every 6 (six) hours as needed for pain.    [provider]  levothyroxine (SYNTHROID, LEVOTHROID) 125 MCG tablet Take 150 mcg by mouth at bedtime.  06/14/14   [provider]  nitroGLYCERIN (NITROSTAT) 0.4 MG SL tablet Place 0.4 mg under the tongue every 5 (five) minutes as needed for chest pain.    [provider]  rizatriptan (MAXALT) 10 MG tablet Take 10 mg by mouth daily as needed for migraine. May repeat in 2 hours if needed    [provider]  topiramate (TOPAMAX) 100 MG tablet Take 100 mg by mouth daily.    [provider]  Turmeric 450 MG CAPS Take by mouth.    [provider]    Family History Family History  Problem Relation Age of Onset  . Diabetes Father   . Heart disease Father   . Hyperlipidemia Father   . Hypertension Father   . Stroke Father   . Diabetes Sister   . Cancer Maternal Grandmother   . Diabetes Paternal Grandfather   . Heart disease Paternal Grandfather   . Hyperlipidemia Paternal Grandfather   . Hypertension Paternal Grandfather     Social History Social History  Substance Use Topics  . Smoking status: Never Smoker  . Smokeless tobacco: Never Used  . Alcohol use No     Allergies   Venofer [ferric oxide] and Azithromycin   Review of Systems Review of Systems  All other systems reviewed and are negative.    Physical Exam Updated Vital Signs BP 101/66 (BP Location: Right Arm)   Pulse 65   Temp 98.6 F (37 C) (Oral)   Resp 17   LMP 04/09/2014 (Approximate)   SpO2 99%   Physical Exam  Constitutional: She is oriented to person, place, and time. She appears well-developed and well-nourished.  HENT:  Head: Normocephalic and atraumatic.  Cardiovascular: Normal rate and regular rhythm.   No murmur heard. Pulmonary/Chest: Effort normal and breath sounds normal. No respiratory distress.  Abdominal: Soft. There is no tenderness. There is no rebound and no guarding.  Musculoskeletal: She exhibits no edema or tenderness.  Neurological: She is alert and oriented to person, place, and time.  Skin: Skin is warm and dry.  Psychiatric: She has a normal mood and affect. Her behavior is normal.  Nursing note and vitals reviewed.    ED Treatments / Results  Labs (all labs ordered are listed, but only abnormal results are displayed) Labs Reviewed  BASIC METABOLIC PANEL - Abnormal; Notable for the following:       Result Value   Sodium 114 (*)    Chloride 85 (*)    CO2 20 (*)    All other components within normal limits  CBC WITH DIFFERENTIAL/PLATELET - Abnormal; Notable for the following:    WBC 3.3 (*)    MCHC  36.3 (*)    All other components within normal limits  OSMOLALITY - Abnormal; Notable for the following:    Osmolality 245 (*)    All other components within normal limits  OSMOLALITY, URINE    EKG  EKG Interpretation None       Radiology No results found.  Procedures Procedures (including critical care time)  Medications Ordered in ED Medications  sodium chloride 0.9 % bolus 500 mL (not administered)     Initial Impression / Assessment and Plan / ED Course  I have reviewed the triage vital signs and the nursing notes.  Pertinent labs & imaging results that were available during my care of the patient were reviewed by me and considered in  my medical decision making (see chart for details).     Patient with history of diabetes insipidus here for evaluation of hyponatremia. She has associated nausea and headache. She is mildly dehydrated on examination, will initiate IV fluids. Hospitialist consulted for admission for further treatment.  Final Clinical Impressions(s) / ED Diagnoses   Final diagnoses:  Hyponatremia    New Prescriptions New Prescriptions   No medications on file     Tilden Fossa, MD 07/28/16 2341

## 2016-07-28 NOTE — ED Triage Notes (Signed)
Pt has blood work results from today with her

## 2016-07-28 NOTE — ED Notes (Signed)
Attempted report x1. 

## 2016-07-29 DIAGNOSIS — E232 Diabetes insipidus: Secondary | ICD-10-CM | POA: Diagnosis present

## 2016-07-29 DIAGNOSIS — R29810 Facial weakness: Secondary | ICD-10-CM | POA: Diagnosis present

## 2016-07-29 DIAGNOSIS — H16209 Unspecified keratoconjunctivitis, unspecified eye: Secondary | ICD-10-CM | POA: Diagnosis present

## 2016-07-29 DIAGNOSIS — E039 Hypothyroidism, unspecified: Secondary | ICD-10-CM | POA: Diagnosis present

## 2016-07-29 DIAGNOSIS — M329 Systemic lupus erythematosus, unspecified: Secondary | ICD-10-CM | POA: Diagnosis present

## 2016-07-29 DIAGNOSIS — E871 Hypo-osmolality and hyponatremia: Secondary | ICD-10-CM | POA: Diagnosis present

## 2016-07-29 DIAGNOSIS — I4581 Long QT syndrome: Secondary | ICD-10-CM | POA: Diagnosis present

## 2016-07-29 DIAGNOSIS — E861 Hypovolemia: Secondary | ICD-10-CM | POA: Diagnosis present

## 2016-07-29 DIAGNOSIS — Z79899 Other long term (current) drug therapy: Secondary | ICD-10-CM | POA: Diagnosis not present

## 2016-07-29 DIAGNOSIS — J0130 Acute sphenoidal sinusitis, unspecified: Secondary | ICD-10-CM | POA: Diagnosis present

## 2016-07-29 DIAGNOSIS — M3500 Sicca syndrome, unspecified: Secondary | ICD-10-CM | POA: Diagnosis present

## 2016-07-29 DIAGNOSIS — E063 Autoimmune thyroiditis: Secondary | ICD-10-CM | POA: Diagnosis not present

## 2016-07-29 LAB — BASIC METABOLIC PANEL
Anion gap: 7 (ref 5–15)
Anion gap: 9 (ref 5–15)
BUN: 6 mg/dL (ref 6–20)
BUN: 6 mg/dL (ref 6–20)
CALCIUM: 8.9 mg/dL (ref 8.9–10.3)
CALCIUM: 9 mg/dL (ref 8.9–10.3)
CO2: 19 mmol/L — ABNORMAL LOW (ref 22–32)
CO2: 20 mmol/L — AB (ref 22–32)
CREATININE: 0.48 mg/dL (ref 0.44–1.00)
CREATININE: 0.51 mg/dL (ref 0.44–1.00)
Chloride: 97 mmol/L — ABNORMAL LOW (ref 101–111)
Chloride: 97 mmol/L — ABNORMAL LOW (ref 101–111)
GFR calc non Af Amer: >60 mL/min (ref >60)
GFR calc non Af Amer: >60 mL/min (ref >60)
Glucose, Bld: 106 mg/dL — ABNORMAL HIGH (ref 65–99)
Glucose, Bld: 93 mg/dL (ref 65–99)
Potassium: 3.6 mmol/L (ref 3.5–5.1)
Potassium: 3.8 mmol/L (ref 3.5–5.1)
SODIUM: 124 mmol/L — AB (ref 135–145)
SODIUM: 125 mmol/L — AB (ref 135–145)

## 2016-07-29 MED ORDER — TRAZODONE HCL 50 MG PO TABS
50.0000 mg | ORAL_TABLET | Freq: Every evening | ORAL | Status: DC | PRN
  Administered 2016-07-30: 50 mg via ORAL
  Filled 2016-07-29: qty 1

## 2016-07-29 MED ORDER — AMOXICILLIN-POT CLAVULANATE 875-125 MG PO TABS
1.0000 | ORAL_TABLET | Freq: Two times a day (BID) | ORAL | Status: DC
  Administered 2016-07-29 – 2016-07-30 (×3): 1 via ORAL
  Filled 2016-07-29 (×3): qty 1

## 2016-07-29 MED ORDER — SODIUM CHLORIDE 0.9 % IV SOLN
INTRAVENOUS | Status: DC
  Administered 2016-07-29 – 2016-07-30 (×2): via INTRAVENOUS

## 2016-07-29 NOTE — Progress Notes (Signed)
Pt was admitted from ED per stretcher accompanied by pt husband and a nurse tech, on arrival to the floor pt was fully alert and oriented self introduced to pt, oriented to the room and pt care equipment, skin assessment done given her food to eat vital signs are stable fall risk assessment done prescribed treatment started, per pt she doesn't have any headache her scheduled iv reglan and benadryl not given and attending has been notified, pt still feels weak and not fully steady on her feet, I have put on high fall risk bracelet and socks on and kept on bed larm, hooked to tele and CCMD notified will continue to monitor pt

## 2016-07-29 NOTE — Progress Notes (Signed)
[facility] PROGRESS NOTE  Stefanie [name] DOB: Mar 18, 1960 DOA: 07/28/2016   PCP: Creola Cornusso, John, MD     Assessment/Plan: Principal Problem:   Hyponatremia Active Problems:   DI (diabetes insipidus) (HCC)   Sjogren's syndrome with keratoconjunctivitis sicca (HCC)   Hypothyroidism, acquired, autoimmune   SLE (systemic lupus erythematosus) (HCC)   Prolonged QT interval   Facial droop    Stefanie [name] is a 56 y.o. 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 Admitted for hyponatremia  Assessment and plan  1. Hyponatremia: due to DDAVP, dehydration Insetting of poor by mouth intake, clinical hypovolemia. Suspect that she would auto correct overnight if DDAVP were held alone, but given history of poor intake and clinical hypovolemia continue IV fluids,    -Hold DDAVP -Normal saline 75 mL per hour -Check sodium in am , correct slowly -Fluid restriction 1.5L -Reglan and diphenhdyramine once for headache    2. Right facial droop:  Patient did not note.  Unsure clinical significance, but feel compelled to image. -MR brain no acute intracranial process, sinusitis  3. Prolonged QT interval:  -Limit QT prolonging medicines -Monitor on tele -Repeat ECG in AM  4. SLE:  -Continue plaquenil  5. Hypothyroidism:  -Continue levothyroxine  6. CPPD:  -Continue colchicine  7. Acute sinusitis We'll start Augmentin   DVT prophylaxsis Lovenox  Code Status:  Full code   Family Communication: Discussed in detail with the patient, all imaging results, lab results explained to the patient   Disposition Plan:   Continue current  treatment until sodium is normalized      Consultants:  None  Procedures:  None  Antibiotics: Anti-infectives    Start     Dose/Rate Route Frequency Ordered  Stop   07/29/16 1000  hydroxychloroquine (PLAQUENIL) tablet 200 mg    Comments:  TAKE 1 TABLET EVERY MORNING AND 1/2 TABLET EVERY EVENING     200 mg Oral Daily 07/28/16 2156           HPI/Subjective: Feels very week,  Has a severe HA   Objective: Vitals:   07/28/16 1605 07/28/16 1945 07/28/16 2218 07/29/16 0659  BP: 101/66 (!) 143/88 115/83 97/62  Pulse: 65 74 69 69  Resp: 17 14 18 17   Temp:   98.1 F (36.7 C) 98.3 F (36.8 C)  TempSrc:   Oral Oral  SpO2: 99% 100% 98% 100%    Intake/Output Summary (Last 24 hours) at 07/29/16 1153 Last data filed at 07/29/16 0659  Gross per 24 hour  Intake             2060 ml  Output             1450 ml  Net              610 ml    Exam:  Examination:  General exam: Appears calm and comfortable  Respiratory system: Clear to auscultation. Respiratory effort normal. Cardiovascular system: S1 & S2 heard, RRR. No JVD, murmurs, rubs, gallops or clicks. No pedal edema. Gastrointestinal system: Abdomen is nondistended, soft and nontender. No organomegaly or masses felt. Normal bowel sounds heard. Central nervous system: Alert and oriented. No focal neurological deficits. Extremities: Symmetric 5 x 5 power. Skin: No rashes, lesions or ulcers Psychiatry: Judgement and insight appear normal. Mood & affect appropriate.  Data Reviewed: I have personally reviewed following labs and imaging studies  Micro Results No results found for this or any previous visit (from the past 240 hour(s)).  Radiology Reports Mr Brain Wo Contrast  Result Date: 07/29/2016 CLINICAL DATA:  Initial valuation for acute facial droop. EXAM: MRI HEAD WITHOUT CONTRAST TECHNIQUE: Multiplanar, multiecho pulse sequences of the brain and surrounding structures were obtained without intravenous contrast. COMPARISON:  Prior CT from 12/13/2012 as well as previous MRI from 06/13/2003. FINDINGS: Brain: Cerebral volume within normal limits for age. No made of a few prominent  dilated perivascular spaces within the bilateral cerebral hemispheres. No focal parenchymal signal abnormality. No significant cerebral white matter disease for age. No abnormal foci of restricted diffusion to suggest acute or subacute ischemia. No areas of chronic infarction identified. No evidence for acute or chronic intracranial hemorrhage. No mass lesion, midline shift or mass effect. Ventricles normal size without evidence for hydrocephalus. No extra-axial fluid collection. Patient status post transsphenoidal resection for remote pituitary lesion. Suprasellar region normal. Midline structures intact and normal. Vascular: Major intracranial vascular flow voids are maintained. Skull and upper cervical spine: Craniocervical junction normal. Visualized upper cervical spine unremarkable. Bone marrow signal intensity within normal limits. No scalp soft tissue abnormality. Sinuses/Orbits: Globes and orbital soft tissues within normal limits. Layering fluid level present within the left sphenoid sinus. Mild scattered mucosal thickening within the ethmoidal air cells and maxillary sinuses. No mastoid effusion. Inner ear structures normal. Other: None. IMPRESSION: 1. No acute intracranial process identified. 2. 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 Otherwise unremarkable brain MRI. 3. Acute left sphenoid sinusitis. Electronically Signed   By: Rise Mu M.D.   On: 07/29/2016 00:27     CBC  Recent Labs Lab 07/28/16 1730  WBC 3.3*  HGB 13.6  HCT 37.5  PLT 240  MCV 79.6  MCH 28.9  MCHC 36.3*  RDW 12.8  LYMPHSABS 0.7  MONOABS 0.5  EOSABS 0.0  BASOSABS 0.0    Chemistries   Recent Labs Lab 07/28/16 1730 07/29/16 0044 07/29/16 0333  NA 114* 124* 125*  K 4.2 3.6 3.8  CL 85* 97* 97*  CO2 20* 20* 19*  GLUCOSE 97 106* 93  BUN 9 6 6   CREATININE 0.63 0.51 0.48  CALCIUM 9.7 9.0 8.9    ------------------------------------------------------------------------------------------------------------------ CrCl cannot be calculated (Unknown ideal weight.). ------------------------------------------------------------------------------------------------------------------ No results for input(s): HGBA1C in the last 72 hours. ------------------------------------------------------------------------------------------------------------------ No results for input(s): CHOL, HDL, LDLCALC, TRIG, CHOLHDL, LDLDIRECT in the last 72 hours. ------------------------------------------------------------------------------------------------------------------ No results for input(s): TSH, T4TOTAL, T3FREE, THYROIDAB in the last 72 hours.  Invalid input(s): FREET3 ------------------------------------------------------------------------------------------------------------------ No results for input(s): VITAMINB12, FOLATE, FERRITIN, TIBC, IRON, RETICCTPCT in the last 72 hours.  Coagulation profile No results for input(s): INR, PROTIME in the last 168 hours.  No results for input(s): DDIMER in the last 72 hours.  Cardiac Enzymes No results for input(s): CKMB, TROPONINI, MYOGLOBIN in the last 168 hours.  Invalid input(s): CK ------------------------------------------------------------------------------------------------------------------ Invalid input(s): POCBNP   CBG: No results for input(s): GLUCAP in the last 168 hours.     Studies: Mr Brain Wo Contrast  Result Date: 07/29/2016 CLINICAL DATA:  Initial valuation for acute facial droop. EXAM: MRI HEAD WITHOUT CONTRAST TECHNIQUE: Multiplanar, multiecho pulse sequences of the brain and surrounding structures were obtained without intravenous contrast. COMPARISON:  Prior CT from 12/13/2012 as well as previous MRI from 06/13/2003. FINDINGS: Brain: Cerebral volume within normal limits for age. No made of a few prominent dilated perivascular  spaces  within the bilateral cerebral hemispheres. No focal parenchymal signal abnormality. No significant cerebral white matter disease for age. No abnormal foci of restricted diffusion to suggest acute or subacute ischemia. No areas of chronic infarction identified. No evidence for acute or chronic intracranial hemorrhage. No mass lesion, midline shift or mass effect. Ventricles normal size without evidence for hydrocephalus. No extra-axial fluid collection. Patient status post transsphenoidal resection for remote pituitary lesion. Suprasellar region normal. Midline structures intact and normal. Vascular: Major intracranial vascular flow voids are maintained. Skull and upper cervical spine: Craniocervical junction normal. Visualized upper cervical spine unremarkable. Bone marrow signal intensity within normal limits. No scalp soft tissue abnormality. Sinuses/Orbits: Globes and orbital soft tissues within normal limits. Layering fluid level present within the left sphenoid sinus. Mild scattered mucosal thickening within the ethmoidal air cells and maxillary sinuses. No mastoid effusion. Inner ear structures normal. Other: None. IMPRESSION: 1. No acute intracranial process identified. 2. 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 Otherwise unremarkable brain MRI. 3. Acute left sphenoid sinusitis. Electronically Signed   By: Rise Mu M.D.   On: 07/29/2016 00:27      No results found for: HGBA1C Lab Results  Component Value Date   LDLCALC 81 04/23/2016   CREATININE 0.48 07/29/2016       Scheduled Meds: . colchicine  0.6 mg Oral Daily  . enoxaparin (LOVENOX) injection  40 mg Subcutaneous Q24H  . hydroxychloroquine  200 mg Oral Daily  . levothyroxine  125 mcg Oral QAC breakfast  . sodium chloride flush  3 mL Intravenous Q12H   Continuous Infusions:   LOS: 0 days    Time spent: >30 MINS    Richarda Overlie  [facility] Pager [phone]. If 7PM-7AM, please contact  night-coverage at [email or web address], password [facility] 07/29/2016, 11:53 AM  LOS: 0 days

## 2016-07-30 DIAGNOSIS — E063 Autoimmune thyroiditis: Secondary | ICD-10-CM

## 2016-07-30 LAB — COMPREHENSIVE METABOLIC PANEL
ALBUMIN: 3.6 g/dL (ref 3.5–5.0)
ALT: 20 U/L (ref 14–54)
AST: 27 U/L (ref 15–41)
Alkaline Phosphatase: 97 U/L (ref 38–126)
Anion gap: 8 (ref 5–15)
BUN: 6 mg/dL (ref 6–20)
CHLORIDE: 106 mmol/L (ref 101–111)
CO2: 20 mmol/L — AB (ref 22–32)
Calcium: 8.9 mg/dL (ref 8.9–10.3)
Creatinine, Ser: 0.68 mg/dL (ref 0.44–1.00)
GFR calc Af Amer: >60 mL/min (ref >60)
GFR calc non Af Amer: >60 mL/min (ref >60)
GLUCOSE: 91 mg/dL (ref 65–99)
POTASSIUM: 3.9 mmol/L (ref 3.5–5.1)
SODIUM: 134 mmol/L — AB (ref 135–145)
Total Bilirubin: 0.5 mg/dL (ref 0.3–1.2)
Total Protein: 7 g/dL (ref 6.5–8.1)

## 2016-07-30 MED ORDER — AMOXICILLIN-POT CLAVULANATE 875-125 MG PO TABS: 1.0000 | ORAL_TABLET | Freq: Two times a day (BID) | ORAL | 0 refills | Status: AC

## 2016-07-30 NOTE — Discharge Instructions (Signed)
Follow-up with PCP in 3-5 days , including all  additional recommended appointments as below Follow-up CBC, CMP in 3-5 days Patient to follow-up with PCP, Dr. Timothy Lassousso prior to resuming ddavp Continue fluid restriction of 1500 mL a day

## 2016-07-30 NOTE — Discharge Summary (Signed)
Physician Discharge Summary  Stefanie [name] MRN: [id number] DOB/AGE: 1960/05/05 56 y.o.  PCP: Shon Baton, MD   Admit date: 07/28/2016 Discharge date: 07/30/2016  Discharge Diagnoses:    Principal Problem:   Hyponatremia Active Problems:   DI (diabetes insipidus) ([facility])   Sjogren's syndrome with keratoconjunctivitis sicca (HCC)   Hypothyroidism, acquired, autoimmune   SLE (systemic lupus erythematosus) (HCC)   Prolonged QT interval   Facial droop    Follow-up recommendations Follow-up with PCP in 3-5 days , including all  additional recommended appointments as below Follow-up CBC, CMP in 3-5 days Patient to follow-up with PCP, Dr. Virgina Jock prior to resuming ddavp. I have personally spoken with him in presence of the patient and he will follow her in the outpatient setting , including labs tomorrow  Continue fluid restriction of 1500 mL a day     Current Discharge Medication List    START taking these medications   Details  amoxicillin-clavulanate (AUGMENTIN) 875-125 MG tablet Take 1 tablet by mouth every 12 (twelve) hours. Qty: 20 tablet, Refills: 0      CONTINUE these medications which have NOT CHANGED   Details  albuterol (PROVENTIL HFA;VENTOLIN HFA) 108 (90 BASE) MCG/ACT inhaler Inhale 1 puff into the lungs every 6 (six) hours as needed for wheezing or shortness of breath.    Cholecalciferol (VITAMIN D PO) Take 1 capsule by mouth daily.    Colchicine 0.6 MG CAPS Take 1 capsule by mouth daily. Qty: 90 capsule, Refills: 1    diclofenac sodium (VOLTAREN) 1 % GEL Voltaren Gel 3 grams to 3 large joints upto TID 3 TUBES with 3 refills Qty: 3 Tube, Refills: 3    hydroxychloroquine (PLAQUENIL) 200 MG tablet TAKE 1 TABLET EVERY MORNING AND 1/2 TABLET EVERY EVENING Qty: 45 tablet, Refills: 2    levothyroxine (SYNTHROID, LEVOTHROID) 125 MCG tablet Take 125 mcg by mouth at bedtime.     rizatriptan (MAXALT) 10 MG tablet Take 10 mg by mouth daily as needed for migraine. May  repeat in 2 hours if needed    topiramate (TOPAMAX) 100 MG tablet Take 100 mg by mouth daily as needed. Migraines    Turmeric 450 MG CAPS Take by mouth.    nitroGLYCERIN (NITROSTAT) 0.4 MG SL tablet Place 0.4 mg under the tongue every 5 (five) minutes as needed for chest pain.      STOP taking these medications     desmopressin (DDAVP RHINAL TUBE) 0.01 % SOLN      ibuprofen (ADVIL,MOTRIN) 200 MG tablet          Discharge Condition:  Stable   Discharge Instructions Get Medicines reviewed and adjusted: Please take all your medications with you for your next visit with your Primary MD  Please request your Primary MD to go over all hospital tests and procedure/radiological results at the follow up, please ask your Primary MD to get all Hospital records sent to his/her office.  If you experience worsening of your admission symptoms, develop shortness of breath, life threatening emergency, suicidal or homicidal thoughts you must seek medical attention immediately by calling 911 or calling your MD immediately if symptoms less severe.  You must read complete instructions/literature along with all the possible adverse reactions/side effects for all the Medicines you take and that have been prescribed to you. Take any new Medicines after you have completely understood and accpet all the possible adverse reactions/side effects.   Do not drive when taking Pain medications.   Do not take more than  prescribed Pain, Sleep and Anxiety Medications  Special Instructions: If you have smoked or chewed Tobacco in the last 2 yrs please stop smoking, stop any regular Alcohol and or any Recreational drug use.  Wear Seat belts while driving.  Please note  You were cared for by a hospitalist during your hospital stay. Once you are discharged, your primary care physician will handle any further medical issues. Please note that NO REFILLS for any discharge medications will be authorized once you are  discharged, as it is imperative that you return to your primary care physician (or establish a relationship with a primary care physician if you do not have one) for your aftercare needs so that they can reassess your need for medications and monitor your lab values.     Allergies  Allergen Reactions  . Venofer [Ferric Oxide] Palpitations    Dizziness, sweating, "felt like I was going to pass out" unknown when episode happened, itching  . Azithromycin Other (See Comments)    "couldnt sleep"      Disposition: 01-Home or Self Care   Consults:  None    Significant Diagnostic Studies:  Mr Brain 59 Contrast  Result Date: 07/29/2016 CLINICAL DATA:  Initial valuation for acute facial droop. EXAM: MRI HEAD WITHOUT CONTRAST TECHNIQUE: Multiplanar, multiecho pulse sequences of the brain and surrounding structures were obtained without intravenous contrast. COMPARISON:  Prior CT from 12/13/2012 as well as previous MRI from 06/13/2003. FINDINGS: Brain: Cerebral volume within normal limits for age. No made of a few prominent dilated perivascular spaces within the bilateral cerebral hemispheres. No focal parenchymal signal abnormality. No significant cerebral white matter disease for age. No abnormal foci of restricted diffusion to suggest acute or subacute ischemia. No areas of chronic infarction identified. No evidence for acute or chronic intracranial hemorrhage. No mass lesion, midline shift or mass effect. Ventricles normal size without evidence for hydrocephalus. No extra-axial fluid collection. Patient status post transsphenoidal resection for remote pituitary lesion. Suprasellar region normal. Midline structures intact and normal. Vascular: Major intracranial vascular flow voids are maintained. Skull and upper cervical spine: Craniocervical junction normal. Visualized upper cervical spine unremarkable. Bone marrow signal intensity within normal limits. No scalp soft tissue abnormality.  Sinuses/Orbits: Globes and orbital soft tissues within normal limits. Layering fluid level present within the left sphenoid sinus. Mild scattered mucosal thickening within the ethmoidal air cells and maxillary sinuses. No mastoid effusion. Inner ear structures normal. Other: None. IMPRESSION: 1. No acute intracranial process identified. 2. 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 Otherwise unremarkable brain MRI. 3. Acute left sphenoid sinusitis. Electronically Signed   By: Jeannine Boga M.D.   On: 07/29/2016 00:27       There were no vitals filed for this visit.   Microbiology: No results found for this or any previous visit (from the past 240 hour(s)).     Blood Culture    Component Value Date/Time   SDES CSF 12/13/2012 2135   SDES CSF 12/13/2012 2135   SPECREQUEST NO 2 3CC 12/13/2012 2135   SPECREQUEST NO 2 3CC 12/13/2012 2135   CULT  12/13/2012 2135    NO GROWTH 3 DAYS Performed at [facility] 12/17/2012 FINAL 12/13/2012 2135   REPTSTATUS 12/13/2012 FINAL 12/13/2012 2135      Labs: Results for orders placed or performed during the hospital encounter of 07/28/16 (from the past 48 hour(s))  Basic metabolic panel     Status: Abnormal   Collection Time: 07/28/16  5:30  PM  Result Value Ref Range   Sodium 114 (LL) 135 - 145 mmol/L    Comment: CRITICAL RESULT CALLED TO, READ BACK BY AND VERIFIED WITH: Randall Hiss 1808 07/28/16 D BRADLEY    Potassium 4.2 3.5 - 5.1 mmol/L   Chloride 85 (L) 101 - 111 mmol/L   CO2 20 (L) 22 - 32 mmol/L   Glucose, Bld 97 65 - 99 mg/dL   BUN 9 6 - 20 mg/dL   Creatinine, Ser 0.63 0.44 - 1.00 mg/dL   Calcium 9.7 8.9 - 10.3 mg/dL   GFR calc non Af Amer >60 >60 mL/min   GFR calc Af Amer >60 >60 mL/min    Comment: (NOTE) The eGFR has been calculated using the CKD EPI equation. This calculation has not been validated in all clinical situations. eGFR's persistently <60 mL/min signify possible  Chronic Kidney Disease.    Anion gap 9 5 - 15  CBC with Differential     Status: Abnormal   Collection Time: 07/28/16  5:30 PM  Result Value Ref Range   WBC 3.3 (L) 4.0 - 10.5 K/uL   RBC 4.71 3.87 - 5.11 MIL/uL   Hemoglobin 13.6 12.0 - 15.0 g/dL   HCT 37.5 36.0 - 46.0 %   MCV 79.6 78.0 - 100.0 fL   MCH 28.9 26.0 - 34.0 pg   MCHC 36.3 (H) 30.0 - 36.0 g/dL   RDW 12.8 11.5 - 15.5 %   Platelets 240 150 - 400 K/uL   Neutrophils Relative % 63 %   Neutro Abs 2.1 1.7 - 7.7 K/uL   Lymphocytes Relative 22 %   Lymphs Abs 0.7 0.7 - 4.0 K/uL   Monocytes Relative 15 %   Monocytes Absolute 0.5 0.1 - 1.0 K/uL   Eosinophils Relative 0 %   Eosinophils Absolute 0.0 0.0 - 0.7 K/uL   Basophils Relative 0 %   Basophils Absolute 0.0 0.0 - 0.1 K/uL  Osmolality     Status: Abnormal   Collection Time: 07/28/16  5:30 PM  Result Value Ref Range   Osmolality 245 (LL) 275 - 295 mOsm/kg    Comment: CRITICAL RESULT CALLED TO, READ BACK BY AND VERIFIED WITH: M.COMPTON,RN 1812 07/28/16 T.TYSOR   I-stat troponin, ED     Status: None   Collection Time: 07/28/16  7:14 PM  Result Value Ref Range   Troponin i, poc 0.00 0.00 - 0.08 ng/mL   Comment 3            Comment: Due to the release kinetics of cTnI, a negative result within the first hours of the onset of symptoms does not rule out myocardial infarction with certainty. If myocardial infarction is still suspected, repeat the test at appropriate intervals.   Osmolality, urine     Status: None   Collection Time: 07/28/16  9:00 PM  Result Value Ref Range   Osmolality, Ur 359 300 - 900 mOsm/kg  Urinalysis, Routine w reflex microscopic     Status: None   Collection Time: 07/28/16  9:00 PM  Result Value Ref Range   Color, Urine YELLOW YELLOW   APPearance CLEAR CLEAR   Specific Gravity, Urine 1.009 1.005 - 1.030   pH 7.0 5.0 - 8.0   Glucose, UA NEGATIVE NEGATIVE mg/dL   Hgb urine dipstick NEGATIVE NEGATIVE   Bilirubin Urine NEGATIVE NEGATIVE    Ketones, ur NEGATIVE NEGATIVE mg/dL   Protein, ur NEGATIVE NEGATIVE mg/dL   Nitrite NEGATIVE NEGATIVE   Leukocytes, UA NEGATIVE NEGATIVE  Sodium, urine, random     Status: None   Collection Time: 07/28/16  9:00 PM  Result Value Ref Range   Sodium, Ur 82 mmol/L  Basic metabolic panel     Status: Abnormal   Collection Time: 07/29/16 12:44 AM  Result Value Ref Range   Sodium 124 (L) 135 - 145 mmol/L    Comment: DELTA CHECK NOTED   Potassium 3.6 3.5 - 5.1 mmol/L   Chloride 97 (L) 101 - 111 mmol/L   CO2 20 (L) 22 - 32 mmol/L   Glucose, Bld 106 (H) 65 - 99 mg/dL   BUN 6 6 - 20 mg/dL   Creatinine, Ser 0.51 0.44 - 1.00 mg/dL   Calcium 9.0 8.9 - 10.3 mg/dL   GFR calc non Af Amer >60 >60 mL/min   GFR calc Af Amer >60 >60 mL/min    Comment: (NOTE) The eGFR has been calculated using the CKD EPI equation. This calculation has not been validated in all clinical situations. eGFR's persistently <60 mL/min signify possible Chronic Kidney Disease.    Anion gap 7 5 - 15  Basic metabolic panel     Status: Abnormal   Collection Time: 07/29/16  3:33 AM  Result Value Ref Range   Sodium 125 (L) 135 - 145 mmol/L   Potassium 3.8 3.5 - 5.1 mmol/L   Chloride 97 (L) 101 - 111 mmol/L   CO2 19 (L) 22 - 32 mmol/L   Glucose, Bld 93 65 - 99 mg/dL   BUN 6 6 - 20 mg/dL   Creatinine, Ser 0.48 0.44 - 1.00 mg/dL   Calcium 8.9 8.9 - 10.3 mg/dL   GFR calc non Af Amer >60 >60 mL/min   GFR calc Af Amer >60 >60 mL/min    Comment: (NOTE) The eGFR has been calculated using the CKD EPI equation. This calculation has not been validated in all clinical situations. eGFR's persistently <60 mL/min signify possible Chronic Kidney Disease.    Anion gap 9 5 - 15  Comprehensive metabolic panel     Status: Abnormal   Collection Time: 07/30/16  5:33 AM  Result Value Ref Range   Sodium 134 (L) 135 - 145 mmol/L   Potassium 3.9 3.5 - 5.1 mmol/L   Chloride 106 101 - 111 mmol/L   CO2 20 (L) 22 - 32 mmol/L   Glucose, Bld 91  65 - 99 mg/dL   BUN 6 6 - 20 mg/dL   Creatinine, Ser 0.68 0.44 - 1.00 mg/dL   Calcium 8.9 8.9 - 10.3 mg/dL   Total Protein 7.0 6.5 - 8.1 g/dL   Albumin 3.6 3.5 - 5.0 g/dL   AST 27 15 - 41 U/L   ALT 20 14 - 54 U/L   Alkaline Phosphatase 97 38 - 126 U/L   Total Bilirubin 0.5 0.3 - 1.2 mg/dL   GFR calc non Af Amer >60 >60 mL/min   GFR calc Af Amer >60 >60 mL/min    Comment: (NOTE) The eGFR has been calculated using the CKD EPI equation. This calculation has not been validated in all clinical situations. eGFR's persistently <60 mL/min signify possible Chronic Kidney Disease.    Anion gap 8 5 - 15     Lipid Panel     Component Value Date/Time   CHOL 153 04/23/2016 1008   TRIG 59 04/23/2016 1008   HDL 60 04/23/2016 1008   CHOLHDL 2.6 04/23/2016 1008   LDLCALC 81 04/23/2016 1008        HPI :  Stefanie [name] is a 56 y.o. 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  The patient was in her usual state of health until two weeks ago she had her lower teeth removed for dentures. Post-operatively, she never really bounced back, has had fatigue, very poor PO intake (small amounts of pureed food only).  In the last 4 days, she has had new headache, worse than her usual migraines, and worse fatigue, cramps, so today she went to see her PCP where she was found to have Na 118 and sent to the ER.  She took opiates only in the first 24 hours post-op, has been taking a few NSAIDs over the weekend for her headaches, no other new medicines.  No LOC, seizures, confusion.   ED course: -Afebrile, heart rate 71, respirations pulse ox normal, blood pressure 147/79 -Na 114, K 4.2, Cr 0.63, WBC 3.3K, Hgb 13.6 -Serum osmolality 245 -ECG showed prolonged QT interval -She was given 500 mL normal saline and TRH was asked to evaluate for  hyponatremia  HOSPITAL COURSE:   1.Hyponatremia:due to DDAVP, dehydration Insetting of poor by mouth intake, clinical hypovolemia.  Patient admitted for IV fluids -Held DDAVP, will be on hold until resumed by Dr Virgina Jock -Received Normal saline 75 mL per hour -Sodium improved from [phone] -Patient to continue with Fluid restriction 1.5L      2. Right facial droop: Resolved -MR brain no acute intracranial process, sinusitis   3. Prolonged QT interval: -Limit QT prolonging medicines -Telemetry uneventful -Repeat ECG in AM shows normal QTC  4. SLE: -Continue plaquenil  5. Hypothyroidism: -Continue levothyroxine  6. CPPD: -Continue colchicine  7. Acute sinusitis Started on Augmentin will continue for another 10 days   Discharge Exam:   Blood pressure 102/68, pulse (!) 58, temperature 97.3 F (36.3 C), temperature source Oral, resp. rate 20, last menstrual period 04/09/2014, SpO2 98 %. General appearance: Thin elderly adult female, alert and in mild distress from malaise, headache.   Eyes: Anicteric, conjunctiva pink, lids and lashes normal. PERRL.    ENT: No nasal deformity, discharge, epistaxis.  Hearing normal. OP tacky dry without lesions.   Neck: No neck masses.  Trachea midline.  No thyromegaly/tenderness. Lymph: No cervical or supraclavicular lymphadenopathy. Skin: Warm and very dry.  No jaundice.  Pink across malar area, no other rashes. Cardiac: RRR, nl S1-S2, no murmurs appreciated.  Capillary refill is brisk.  JVP not visible.  No LE edema.  Radial and DP pulses 2+ and symmetric. Respiratory: Normal respiratory rate and rhythm.  CTAB without rales or wheezes.     Follow-up Information    Shon Baton, MD. Call.   Specialty:  Internal Medicine Why:  Hospital follow-up in 3-5 days Contact information: [place] [address] [phone]           Signed: Reyne Dumas 07/30/2016, 9:00 AM        Time spent >45  mins

## 2016-08-04 NOTE — Progress Notes (Deleted)
Cardiology Office Note    Date:  08/04/2016   ID:  Stefanie [name], DOB 1960/08/16, MRN [id number]  PCP:  Creola Corn, MD  Cardiologist:  New- seen by Dr. Eden Emms in 2010  CC: chest pain / palpitations  History of Present Illness:  Stefanie [name] is a 56 y.o. 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   She was admitted in 11/2012 severe R Chest/Shoulder/back pain c/w lupus flare causing pleurisy vrs severe constcochonditis with anxiety and SOB. No Shingles seen. EKG/CXR/CTPA were all (-). She ruled out for MI. ECHO showed EF 55-60% c no wall motion abnormalities, mild MR. She was treated with pain management and discharged home.  Had symptoms of panic attack in 04/2016  Seen by Carlean Jews PA  04/23/16 Echo 05/08/16 reviewed mild to moderate MR EF 55-60% otherwise normal Myovue 05/08/16 breast attenuation otherwise normal EF 64%   07/28/16 Hospitalized after having teeth pulled. Low sodium 118 Headache  DDAVP held ? Right facial droop resolved And MRI normal QT c transiently prolonged normal on d/c   Over the last month or so she has felt like her heart has occasional racing and irregular heart beats. Gets uncomfortable feeling like she might pass out. She does get some associated chest pain that is sharp. No orthopnea or PND. She is very active at work around the house but doesn't formally exercise. Does get exertional SOB. She had a little bit of blood in her stool the other day. No history of hemorrhoids.   She has a family history of very early CAD. Her father had a CVA in his 84s and father had first heart event in 30s. He also had bypass surgery. Father's side of the family has a lot of CAD.    Past Medical History:  Diagnosis Date  . Anemia   . Arthritis   . Asthma   . Diabetes insipidus  (HCC)   . Fibromyalgia   . Lupus   . Mitral valve prolapse   . SLE (systemic lupus erythematosus) (HCC) 12/06/2012  . Thyroid disease     Past Surgical History:  Procedure Laterality Date  . NO PAST SURGERIES      Current Medications: Outpatient Medications Prior to Visit  Medication Sig Dispense Refill  . albuterol (PROVENTIL HFA;VENTOLIN HFA) 108 (90 BASE) MCG/ACT inhaler Inhale 1 puff into the lungs every 6 (six) hours as needed for wheezing or shortness of breath.    [name] amoxicillin-clavulanate (AUGMENTIN) 875-125 MG tablet Take 1 tablet by mouth every 12 (twelve) hours. 20 tablet 0  . Cholecalciferol (VITAMIN D PO) Take 1 capsule by mouth daily.    . Colchicine 0.6 MG CAPS Take 1 capsule by mouth daily. 90 capsule 1  . diclofenac sodium (VOLTAREN) 1 % GEL Voltaren Gel 3 grams to 3 large joints upto TID 3 TUBES with 3 refills 3 Tube 3  . hydroxychloroquine (PLAQUENIL) 200 MG tablet TAKE 1 TABLET EVERY MORNING AND 1/2 TABLET EVERY EVENING (Patient taking differently: Take 200 mg by mouth daily. ) 45 tablet 2  . levothyroxine (SYNTHROID, LEVOTHROID) 125 MCG tablet Take 125 mcg by mouth at bedtime.     . nitroGLYCERIN (NITROSTAT) 0.4 MG SL tablet Place 0.4 mg under the tongue every 5 (five) minutes as needed for chest pain.    . rizatriptan (MAXALT) 10  MG tablet Take 10 mg by mouth daily as needed for migraine. May repeat in 2 hours if needed    . topiramate (TOPAMAX) 100 MG tablet Take 100 mg by mouth daily as needed. Migraines    . Turmeric 450 MG CAPS Take by mouth.     No facility-administered medications prior to visit.      Allergies:   Venofer [ferric oxide] and Azithromycin   Social History   Social History  . Marital status: Married    Spouse name: N/A  . Number of children: N/A  . Years of education: N/A   Social History Main Topics  . Smoking status: Never Smoker  . Smokeless tobacco: Never Used  . Alcohol use No  . Drug use: No  . Sexual activity: Not on file     Other Topics Concern  . Not on file   Social History Narrative  . No narrative on file     Family History:  The patient's family history includes Cancer in her maternal grandmother; Diabetes in her father, paternal grandfather, and sister; Heart disease in her father and paternal grandfather; Hyperlipidemia in her father and paternal grandfather; Hypertension in her father and paternal grandfather; Stroke in her father.      ROS:   Please see the history of present illness.    ROS All other systems reviewed and are negative.   PHYSICAL EXAM:   VS:  LMP 04/09/2014 (Approximate)    Affect appropriate Healthy:  appears stated age HEENT: normal Neck supple with no adenopathy JVP normal no bruits no thyromegaly Lungs clear with no wheezing and good diaphragmatic motion Heart:  S1/S2 no murmur, no rub, gallop or click PMI normal Abdomen: benighn, BS positve, no tenderness, no AAA no bruit.  No HSM or HJR Distal pulses intact with no bruits No edema Neuro non-focal Skin warm and dry No muscular weakness   Wt Readings from Last 3 Encounters:  05/12/16 145 lb (65.8 kg)  04/23/16 142 lb 1.9 oz (64.5 kg)  03/30/16 140 lb (63.5 kg)      Studies/Labs Reviewed:   EKG:  07/29/16  SR PR 226 QT 398 rate 73 nonspecific ST changes   Recent Labs: 07/28/2016: Hemoglobin 13.6; Platelets 240 07/30/2016: ALT 20; BUN 6; Creatinine, Ser 0.68; Potassium 3.9; Sodium 134   Lipid Panel    Component Value Date/Time   CHOL 153 04/23/2016 1008   TRIG 59 04/23/2016 1008   HDL 60 04/23/2016 1008   CHOLHDL 2.6 04/23/2016 1008   LDLCALC 81 04/23/2016 1008    Additional studies/ records that were reviewed today include:  2D ECHO: 12/07/2012 LV EF: 55% -  60% Study Conclusion - Left ventricle: The cavity size was normal. Systolic function was normal. The estimated ejection fraction wasin the range of 55% to 60%. Wall motion was normal; therewere no regional wall motion  abnormalities. - Mitral valve: Mild regurgitation. - Atrial septum: No defect or patent foramen ovale was identified.   ASSESSMENT & PLAN:   Chest pain/dyspnea on exertion: normal myovue and echo no further w/u indicated   Palpitations: event monitor reveiwed 04/23/16 rare PAC/s/ PVC;s observe   SLE: on plaquenil, followed by rheum.   Hyponatremia: followed closely by Dr Timothy Lasso DDAVP held last admission   Diabetes insipidus: on DDVAP   Medication Adjustments/Labs and Tests Ordered: Current medicines are reviewed at length with the patient today.  Concerns regarding medicines are outlined above.  Medication changes, Labs and Tests ordered today are listed in  the Patient Instructions below. There are no Patient Instructions on file for this visit.   Signed, Charlton HawsPeter Dewight Catino, MD  08/04/2016 1:31 PM    [facility] [address], [place], [place]  [address] Phone: [phone]; Fax: [phone]

## 2016-08-06 ENCOUNTER — Ambulatory Visit: Payer: Managed Care, Other (non HMO) | Admitting: Cardiovascular Disease

## 2016-08-06 ENCOUNTER — Telehealth: Payer: Self-pay | Admitting: Cardiovascular Disease

## 2016-08-06 NOTE — Telephone Encounter (Signed)
New message    Pt would like waive cancellation fee, due to she is sick and her sodium level dropped and her doctor wanted her to stay in bed.

## 2016-08-06 NOTE — Telephone Encounter (Signed)
Will route to manager

## 2016-08-24 ENCOUNTER — Other Ambulatory Visit: Payer: 59

## 2016-08-27 ENCOUNTER — Ambulatory Visit
Admission: RE | Admit: 2016-08-27 | Discharge: 2016-08-27 | Disposition: A | Discharge status: Home / Self Care | Payer: 59 | Source: Ambulatory Visit | Attending: Internal Medicine | Admitting: Internal Medicine

## 2016-08-27 ENCOUNTER — Other Ambulatory Visit: Payer: Self-pay | Admitting: Internal Medicine

## 2016-08-27 DIAGNOSIS — N6012 Diffuse cystic mastopathy of left breast: Secondary | ICD-10-CM

## 2016-09-02 ENCOUNTER — Telehealth: Payer: Self-pay | Admitting: Rheumatology

## 2016-09-02 NOTE — Telephone Encounter (Signed)
Patient would like to know when she is due for labs.  [phone].  Thank you.

## 2016-09-02 NOTE — Telephone Encounter (Signed)
Patient advised she is due for labs and a follow up visit August 2018. Patient is scheduled for her follow up visit.

## 2016-09-16 ENCOUNTER — Other Ambulatory Visit: Payer: Self-pay | Admitting: Rheumatology

## 2016-09-16 NOTE — Telephone Encounter (Signed)
Last Visit: 05/12/16 Next Visit: 10/13/16 Labs: 05/12/16 Stable  PLQ Eye Exam: 10/28/15 WNL   Okay to refill per Dr. Corliss Skainseveshwar

## 2016-10-08 DIAGNOSIS — M797 Fibromyalgia: Secondary | ICD-10-CM | POA: Insufficient documentation

## 2016-10-08 DIAGNOSIS — R5383 Other fatigue: Secondary | ICD-10-CM | POA: Insufficient documentation

## 2016-10-08 DIAGNOSIS — M5136 Other intervertebral disc degeneration, lumbar region: Secondary | ICD-10-CM | POA: Insufficient documentation

## 2016-10-08 DIAGNOSIS — M7071 Other bursitis of hip, right hip: Secondary | ICD-10-CM | POA: Insufficient documentation

## 2016-10-08 DIAGNOSIS — M2241 Chondromalacia patellae, right knee: Secondary | ICD-10-CM | POA: Insufficient documentation

## 2016-10-08 NOTE — Progress Notes (Signed)
Office Visit Note  Patient: Stefanie [name]             Date of Birth: 08-17-1960           MRN: [id number]             PCP: Creola Cornusso, John, MD Referring: Creola Cornusso, John, MD Visit Date: 10/13/2016 Occupation: @GUAROCC @    Subjective:  Increased joint pain.   History of Present Illness: Stefanie SangRenee J Mcgilvray is a 56 y.o. female with history of autoimmune disease osteoarthritis and disc disease. She states she was recently hospitalized due to hyponatremia. She forgot to take her  nasal spray. Her PCP advised her not to take her nasal spray for right now. She had been very active and has been working out. She's been also experiencing increased joint pain. She describes discomfort in her lower back and her right hip region.  Activities of Daily Living Patient reports morning stiffness for 45 minutes.   Patient Reports nocturnal pain.  Difficulty dressing/grooming: Reports Difficulty climbing stairs: Reports Difficulty getting out of chair: Reports Difficulty using hands for taps, buttons, cutlery, and/or writing: Reports   Review of Systems  Constitutional: Positive for fatigue. Negative for night sweats, weight gain, weight loss and weakness.  HENT: Positive for mouth sores and mouth dryness. Negative for trouble swallowing, trouble swallowing and nose dryness.        Related to dentures  Eyes: Positive for redness and dryness. Negative for pain and visual disturbance.  Respiratory: Negative.  Negative for cough, shortness of breath and difficulty breathing.   Cardiovascular: Negative.  Negative for chest pain, palpitations, hypertension, irregular heartbeat and swelling in legs/feet.  Gastrointestinal: Positive for blood in stool. Negative for constipation and diarrhea.  Endocrine: Negative.  Negative for increased urination.  Genitourinary: Negative.  Negative for nocturia and vaginal dryness.  Musculoskeletal: Positive for arthralgias, joint pain, muscle weakness and morning stiffness.  Negative for joint swelling, myalgias, muscle tenderness and myalgias.  Skin: Positive for rash. Negative for color change, hair loss, skin tightness, ulcers and sensitivity to sunlight.  Allergic/Immunologic: Negative for susceptible to infections.  Neurological: Negative.  Negative for dizziness, headaches, memory loss and night sweats.  Hematological: Negative.  Negative for swollen glands.  Psychiatric/Behavioral: Positive for sleep disturbance. Negative for depressed mood. The patient is not nervous/anxious.     PMFS History:  Patient Active Problem List   Diagnosis Date Noted  . Bursitis, ischial, right 10/08/2016  . 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 Fibromyalgia 10/08/2016  . Other fatigue 10/08/2016  . DDD (degenerative disc disease), lumbar/ and scoliosis 10/08/2016  . Facial droop   . 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 SLE (systemic lupus erythematosus) (HCC) 07/28/2016  . Prolonged QT interval 07/28/2016  . Primary osteoarthritis of both hands 05/11/2016  . Primary osteoarthritis of both knees 05/11/2016  . History of hypothyroidism 05/11/2016  . History of migraine 05/11/2016  . 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 ANA positive 04/29/2016  . Rheumatoid factor positive 04/29/2016  . Pseudogout 04/29/2016  . Chondrocalcinosis 04/29/2016  . High risk medication use 04/29/2016  . Sjogren's syndrome with keratoconjunctivitis sicca (HCC) 04/29/2016  . History of neutropenia 04/29/2016  . 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  . Hyponatremia 12/12/2013  . Fever 12/13/2012  . Pleuritic chest pain 12/06/2012  . 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 Anxiety 12/06/2012  . ANEMIA-IRON DEFICIENCY 01/08/2010  . GASTRIC DILATION, ACUTE  01/08/2010  . NAUSEA ALONE 01/08/2010  . ABDOMINAL PAIN, LEFT UPPER QUADRANT 01/08/2010    Past Medical  History:  Diagnosis Date  . Anemia   . Arthritis   . Asthma   . Diabetes insipidus (HCC)   . Fibromyalgia   . Lupus   . Mitral valve prolapse   . SLE (systemic lupus erythematosus) (HCC) 12/06/2012  . Thyroid disease     Family History  Problem Relation Age of Onset  . Diabetes Father   . Heart disease Father   . Hyperlipidemia Father   . Hypertension Father   . Stroke Father   . Diabetes Sister   . Cancer Maternal Grandmother   . Diabetes Paternal Grandfather   . Heart disease Paternal Grandfather   . Hyperlipidemia Paternal Grandfather   . Hypertension Paternal Grandfather    Past Surgical History:  Procedure Laterality Date  . NO PAST SURGERIES     Social History   Social History Narrative  . No narrative on file     Objective: Vital Signs: BP 107/71   Pulse 80   Resp 14   Ht 5\' 6"  (1.676 m)   Wt 136 lb (61.7 kg)   LMP 04/09/2014 (Approximate)   BMI 21.95 kg/m    Physical Exam  Constitutional: She is oriented to person, place, and time. She appears well-developed and well-nourished.  HENT:  Head: Normocephalic and atraumatic.  Eyes: Conjunctivae and EOM are normal.  Neck: Normal range of motion.  Cardiovascular: Normal rate, regular rhythm, normal heart sounds and intact distal pulses.   Pulmonary/Chest: Effort normal and breath sounds normal.  Abdominal: Soft. Bowel sounds are normal.  Lymphadenopathy:    She has no cervical adenopathy.  Neurological: She is alert and oriented to person, place, and time.  Skin: Skin is warm and dry. Capillary refill takes less than 2 seconds.  Psychiatric: She has a normal mood and affect. Her behavior is normal.  Nursing note and vitals reviewed.    Musculoskeletal Exam: C-spine fairly good range of motion. She has discomfort on palpation of her lumbar spine and discomfort range of motion of her lumbar spine. Shoulder joints elbow joints wrist joint MCPs PIPs DIPs with good range of motion with no synovitis. She is  painful range of motion of her right hip joint, left hip joints, knee joints ankles MTPs PIPs DIPs are good range of motion with no synovitis. She tenderness on palpation of her right trochanteric bursa.  CDAI Exam: No CDAI exam completed.    Investigation: No additional findings. CBC Latest Ref Rng & Units 07/28/2016 05/12/2016 02/06/2016  WBC 4.0 - 10.5 K/uL 3.3(L) 4.7 3.9  Hemoglobin 12.0 - 15.0 g/dL 45.413.6 09.812.5 11.912.2  Hematocrit 36.0 - 46.0 % 37.5 37.2 37.0  Platelets 150 - 400 K/uL 240 238 247   CMP Latest Ref Rng & Units 07/30/2016 07/29/2016 07/29/2016  Glucose 65 - 99 mg/dL 91 93 147(W106(H)  BUN 6 - 20 mg/dL 6 6 6   Creatinine 0.44 - 1.00 mg/dL 2.950.68 6.210.48 3.080.51  Sodium 135 - 145 mmol/L 134(L) 125(L) 124(L)  Potassium 3.5 - 5.1 mmol/L 3.9 3.8 3.6  Chloride 101 - 111 mmol/L 106 97(L) 97(L)  CO2 22 - 32 mmol/L 20(L) 19(L) 20(L)  Calcium 8.9 - 10.3 mg/dL 8.9 8.9 9.0  Total Protein 6.5 - 8.1 g/dL 7.0 - -  Total Bilirubin 0.3 - 1.2 mg/dL 0.5 - -  Alkaline Phos 38 - 126 U/L 97 - -  AST 15 - 41 U/L  27 - -  ALT 14 - 54 U/L 20 - -    Imaging: Xr Hip Unilat W Or W/o Pelvis 2-3 Views Right  Result Date: 10/13/2016 No hip joint narrowing or SI joint changes were noted. Normal x-ray of the hip joint.  Xr Lumbar Spine 2-3 Views  Result Date: 10/13/2016 Mild scoliosis. Multilevel spondylosis from L1-L5. Anterior spurring and facet joint arthropathy was noted. Impression multilevel spondylosis   Speciality Comments: No specialty comments available.    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  Sjogren's syndrome with keratoconjunctivitis sicca (HCC): She continues to have some sicca symptoms for which she's been using over-the-counter products.  High risk  medication use - Plaquenil 300 mg po qd. Her labs have been stable. She does have some neutropenia. She has some recent labs that Dr. Charlott Rakes office which she will forward to [place].  Chondrocalcinosis - on Colchicine when necessary  Primary osteoarthritis of both hands: Some stiffness  Pain of right hip joint. She's having increased pain in her right hip joint. - Plan: XR HIP UNILAT W OR W/O PELVIS 2-3 VIEWS RIGHT . Her hip joint x-ray was unremarkable. I believe her symptoms are coming from the trochanteric bursitis. Have given her ITB and exercises to do. Some of those exercises were demonstrated in the office as well.  Primary osteoarthritis of both knees: Chronic pain  Chondromalacia patellae, right knee  DDD (degenerative disc disease), lumbar/ and scoliosis. She's been experiencing increased lower back pain - Plan: XR Lumbar Spine 2-3 Views: Multilevel spondylosis was noted. She states she does have a handout on exercises which she'll plan to do at home. She will contact me if her symptoms do not improve.  Her other medical problems are listed as follows:  History of diabetes insipidus s/p pituatary tumor   History of hypothyroidism/ autoimmune  History of migraine  Prolonged QT interval  Sedimentation rate elevation (chronic)  Fibromyalgia: She continues to have generalized pain and discomfort.  Other fatigue  Orders: Orders Placed This Encounter  Procedures  . XR HIP UNILAT W OR W/O PELVIS 2-3 VIEWS RIGHT  . XR Lumbar Spine 2-3 Views   No orders of the defined types were placed in this encounter.   Face-to-face time spent with patient was 30  Minutes. Greater than 50% of time was spent in counseling and coordination of care.  Follow-Up Instructions: Return in about 5 months (around 03/15/2017) for autoimmune disease, OA,DDD.   Pollyann Savoy, MD  Note - This record has been created using Animal nutritionist.  Chart creation errors have been sought, but may not always    have been located. Such creation errors do not reflect on  the standard of medical care.

## 2016-10-13 ENCOUNTER — Encounter: Payer: Self-pay | Admitting: Rheumatology

## 2016-10-13 ENCOUNTER — Ambulatory Visit (INDEPENDENT_AMBULATORY_CARE_PROVIDER_SITE_OTHER): Payer: Managed Care, Other (non HMO)

## 2016-10-13 ENCOUNTER — Ambulatory Visit (INDEPENDENT_AMBULATORY_CARE_PROVIDER_SITE_OTHER): Payer: Managed Care, Other (non HMO) | Admitting: Rheumatology

## 2016-10-13 VITALS — BP 107/71 | HR 80 | Resp 14 | Ht 66.0 in | Wt 136.0 lb

## 2016-10-13 DIAGNOSIS — M170 Bilateral primary osteoarthritis of knee: Secondary | ICD-10-CM | POA: Diagnosis not present

## 2016-10-13 DIAGNOSIS — M3501 Sicca syndrome with keratoconjunctivitis: Secondary | ICD-10-CM | POA: Diagnosis not present

## 2016-10-13 DIAGNOSIS — M25551 Pain in right hip: Secondary | ICD-10-CM | POA: Diagnosis not present

## 2016-10-13 DIAGNOSIS — M1120 Other chondrocalcinosis, unspecified site: Secondary | ICD-10-CM | POA: Diagnosis not present

## 2016-10-13 DIAGNOSIS — Z8669 Personal history of other diseases of the nervous system and sense organs: Secondary | ICD-10-CM

## 2016-10-13 DIAGNOSIS — M5136 Other intervertebral disc degeneration, lumbar region: Secondary | ICD-10-CM

## 2016-10-13 DIAGNOSIS — M2241 Chondromalacia patellae, right knee: Secondary | ICD-10-CM

## 2016-10-13 DIAGNOSIS — Z8639 Personal history of other endocrine, nutritional and metabolic disease: Secondary | ICD-10-CM

## 2016-10-13 DIAGNOSIS — D8989 Other specified disorders involving the immune mechanism, not elsewhere classified: Secondary | ICD-10-CM

## 2016-10-13 DIAGNOSIS — R5383 Other fatigue: Secondary | ICD-10-CM

## 2016-10-13 DIAGNOSIS — M19041 Primary osteoarthritis, right hand: Secondary | ICD-10-CM | POA: Diagnosis not present

## 2016-10-13 DIAGNOSIS — R700 Elevated erythrocyte sedimentation rate: Secondary | ICD-10-CM

## 2016-10-13 DIAGNOSIS — R9431 Abnormal electrocardiogram [ECG] [EKG]: Secondary | ICD-10-CM

## 2016-10-13 DIAGNOSIS — Z79899 Other long term (current) drug therapy: Secondary | ICD-10-CM

## 2016-10-13 DIAGNOSIS — M359 Systemic involvement of connective tissue, unspecified: Secondary | ICD-10-CM

## 2016-10-13 DIAGNOSIS — M19042 Primary osteoarthritis, left hand: Secondary | ICD-10-CM

## 2016-10-13 DIAGNOSIS — M797 Fibromyalgia: Secondary | ICD-10-CM

## 2016-10-13 NOTE — Patient Instructions (Signed)
Iliotibial Bursitis Rehab Ask your health care provider which exercises are safe for you. Do exercises exactly as told by your health care provider and adjust them as directed. It is normal to feel mild stretching, pulling, tightness, or discomfort as you do these exercises, but you should stop right away if you feel sudden pain or your pain gets worse.Do not begin these exercises until told by your health care provider. Stretching and range of motion exercises These exercises warm up your muscles and joints and improve the movement and flexibility of your leg. These exercises also help to relieve pain and stiffness. Exercise A: Quadriceps stretch, prone  1. Lie on your abdomen on a firm surface, such as a bed or padded floor. 2. Bend your left / right knee and hold your ankle. If you cannot reach your ankle or pant leg, loop a belt around your foot and grab the belt instead. 3. Gently pull your heel toward your buttocks. Your knee should not slide out to the side. You should feel a stretch in the front of your thigh and knee. 4. Hold this position for __________ seconds. Repeat __________ times. Complete this exercise __________ times a day. Exercise B: Lunge ( adductor stretch) 1. Stand and spread your legs about 3 feet (about 1 m) apart. Put your left / right leg slightly back for balance. 2. Lean away from your left / right leg by bending your other knee and shifting your weight toward your bent knee. You may rest your hands on your thigh for balance. You should feel a stretch in your left / right inner thigh. 3. Hold for __________ seconds. Repeat __________ times. Complete this exercise __________ times a day. Exercise C: Hamstring stretch, supine  1. Lie on your back. 2. Hold both ends of a belt or towel as you loop it over the ball of your left / right foot. The ball of your foot is on the walking surface, right under your toes. 3. Straighten your left / right knee and slowly pull on  the belt to raise your leg. Stop when you feel a gentle stretch in the back of your left / right knee or thigh. ? Do not let your left / right knee bend. ? Keep your other leg flat on the floor. 4. Hold this position for __________ seconds. Repeat __________ times. Complete this exercise __________ times a day. Strengthening exercises These exercises build strength and endurance in your leg. Endurance is the ability to use your muscles for a long time, even after they get tired. Exercise D: Quadriceps wall slides  1. Lean your back against a smooth wall or door while you walk your feet out 18-24 inches (46-61 cm) from it. 2. Place your feet hip-width apart. 3. Slowly slide down the wall or door until your knees bend as far as told by your health care provider. Keep your knees over your heels, not your toes. Keep your knees in line with your hips. 4. Hold for __________ seconds. 5. Push through your heels to stand up to rest for __________ seconds after each repetition. Repeat __________ times. Complete this exercise __________ times a day. Exercise E: Straight leg raises ( hip abductors) 1. Lie on your side, with your left / right leg in the top position. Lie so your head, shoulder, knee, and hip line up with each other. You may bend your bottom knee to help you balance. 2. Lift your top leg 4-6 inches (10-15 cm) while keeping your   toes pointed straight ahead. 3. Hold this position for __________ seconds. 4. Slowly lower your leg to the starting position. Allow your muscles to relax completely after each repetition. Repeat __________ times. Complete this exercise __________ times a day. Exercise F: Straight leg raises ( hip extensors) 1. Lie on your abdomen on a firm surface. You can put a pillow under your hips if that is more comfortable. 2. Tense the muscles in your buttocks and lift your left / right leg about 4-6 inches (10-15 cm). Keep your knee straight as you lift your leg. 3. Hold  this position for __________ seconds. 4. Slowly lower your leg to the starting position. 5. Let your leg relax completely after each repetition. Repeat __________ times. Complete this exercise __________ times a day. Exercise G: Bridge ( hip extensors) 1. Lie on your back on a firm surface with your knees bent and your feet flat on the floor. 2. Tighten your buttocks muscles and lift your bottom off the floor until your trunk is level with your thighs. ? Do not arch your back. ? You should feel the muscles working in your buttocks and the back of your thighs. If you do not feel these muscles, slide your feet 1-2 inches (2.5-5 cm) farther away from your buttocks. 3. Hold this position for __________ seconds. 4. Slowly lower your hips to the starting position. 5. Let your buttocks muscles relax completely between repetitions. 6. If this exercise is too easy, try doing it with your arms crossed over your chest. Repeat __________ times. Complete this exercise __________ times a day. This information is not intended to replace advice given to you by your health care provider. Make sure you discuss any questions you have with your health care provider. Document Released: 02/23/2005 Document Revised: 10/31/2015 Document Reviewed: 02/05/2015 [employer] Interactive Patient Education  2018 [employer]  

## 2017-01-19 ENCOUNTER — Other Ambulatory Visit: Payer: Self-pay | Admitting: Internal Medicine

## 2017-01-19 DIAGNOSIS — R921 Mammographic calcification found on diagnostic imaging of breast: Secondary | ICD-10-CM

## 2017-02-26 ENCOUNTER — Other Ambulatory Visit: Payer: Self-pay | Admitting: Internal Medicine

## 2017-02-26 ENCOUNTER — Ambulatory Visit: Payer: 59

## 2017-02-26 DIAGNOSIS — Z1231 Encounter for screening mammogram for malignant neoplasm of breast: Secondary | ICD-10-CM

## 2017-03-03 ENCOUNTER — Ambulatory Visit
Admission: RE | Admit: 2017-03-03 | Discharge: 2017-03-03 | Disposition: A | Discharge status: Home / Self Care | Payer: 59 | Source: Ambulatory Visit | Attending: Internal Medicine | Admitting: Internal Medicine

## 2017-03-03 ENCOUNTER — Telehealth: Payer: Self-pay | Admitting: Rheumatology

## 2017-03-03 DIAGNOSIS — Z1231 Encounter for screening mammogram for malignant neoplasm of breast: Secondary | ICD-10-CM

## 2017-03-03 NOTE — Progress Notes (Signed)
Office Visit Note  Patient: Stefanie [name]             Date of Birth: [birth year]           MRN: [id number]             PCP: Creola Cornusso, John, MD Referring: Creola Cornusso, John, MD Visit Date: 03/04/2017 Occupation: @GUAROCC @    Subjective:  Right knee swelling   History of Present Illness: Stefanie SangRenee J Fredlund is a 56 y.o. female with history of autoimmune disease, chondrocalcinosis and osteoarthritis. According to patient for the last 2 months she's been having some discomfort in her right knee joint. The pain has been worse in the last 4 weeks. She states she's been feeling warmth and swelling in her right knee joint. She has difficulty walking due to right knee joint discomfort. She's been having some left knee joint pain. She also complains of some discomfort in her bilateral hands. She also has some lower back pain. She's been taking colchicine on regular basis.  Activities of Daily Living:  Patient reports morning stiffness for 20 minute.   Patient Reports nocturnal pain.  Difficulty dressing/grooming: Reports Difficulty climbing stairs: Reports Difficulty getting out of chair: Reports Difficulty using hands for taps, buttons, cutlery, and/or writing: Reports   Review of Systems  Constitutional: Positive for fatigue. Negative for night sweats, weight gain, weight loss and weakness.  HENT: Positive for mouth dryness. Negative for mouth sores, trouble swallowing, trouble swallowing and nose dryness.   Eyes: Positive for dryness. Negative for pain, redness and visual disturbance.  Respiratory: Negative.  Negative for cough, shortness of breath and difficulty breathing.   Cardiovascular: Negative for chest pain, palpitations, hypertension, irregular heartbeat and swelling in legs/feet.  Gastrointestinal: Negative.  Negative for blood in stool, constipation and diarrhea.  Endocrine: Negative for increased urination.  Genitourinary: Negative for vaginal dryness.  Musculoskeletal: Positive for  arthralgias, joint pain, joint swelling, myalgias, morning stiffness and myalgias. Negative for muscle weakness and muscle tenderness.  Skin: Negative for color change, rash, hair loss, skin tightness, ulcers and sensitivity to sunlight.  Allergic/Immunologic: Negative for susceptible to infections.  Neurological: Negative for dizziness, numbness, headaches, memory loss and night sweats.  Hematological: Negative for swollen glands.  Psychiatric/Behavioral: Positive for sleep disturbance. Negative for depressed mood. The patient is not nervous/anxious.     PMFS History:  Patient Active Problem List   Diagnosis Date Noted  . Bursitis, ischial, right 10/08/2016  . 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 Fibromyalgia 10/08/2016  . Other fatigue 10/08/2016  . DDD (degenerative disc disease), lumbar/ and scoliosis 10/08/2016  . Facial droop   . 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 SLE (systemic lupus erythematosus) (HCC) 07/28/2016  . Prolonged QT interval 07/28/2016  . Primary osteoarthritis of both hands 05/11/2016  . Primary osteoarthritis of both knees 05/11/2016  . History of hypothyroidism 05/11/2016  . History of migraine 05/11/2016  . 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 ANA positive 04/29/2016  . Rheumatoid factor positive 04/29/2016  . Pseudogout 04/29/2016  . Chondrocalcinosis 04/29/2016  . High risk medication use 04/29/2016  . Sjogren's syndrome with keratoconjunctivitis sicca (HCC) 04/29/2016  . History of neutropenia 04/29/2016  . 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  . Hyponatremia 12/12/2013  . Fever 12/13/2012  . Pleuritic chest pain 12/06/2012  . 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 Anxiety 12/06/2012  . ANEMIA-IRON DEFICIENCY 01/08/2010  . GASTRIC DILATION, ACUTE  01/08/2010  . NAUSEA ALONE 01/08/2010  . ABDOMINAL PAIN, LEFT UPPER  QUADRANT 01/08/2010    Past Medical History:  Diagnosis Date  . Anemia   . Arthritis   . Asthma   . Diabetes insipidus (HCC)   . Fibromyalgia   . Lupus   . Mitral valve prolapse   . SLE (systemic lupus erythematosus) (HCC) 12/06/2012  . Thyroid disease     Family History  Problem Relation Age of Onset  . Diabetes Father   . Heart disease Father   . Hyperlipidemia Father   . Hypertension Father   . Stroke Father   . Diabetes Sister   . Cancer Maternal Grandmother   . Diabetes Paternal Grandfather   . Heart disease Paternal Grandfather   . Hyperlipidemia Paternal Grandfather   . Hypertension Paternal Grandfather    Past Surgical History:  Procedure Laterality Date  . NO PAST SURGERIES     Social History   Social History Narrative  . Not on file     Objective: Vital Signs: BP 104/64 (BP Location: Left Arm, Patient Position: Sitting, Cuff Size: Normal)   Pulse 79   Ht 5\' 6"  (1.676 m)   Wt 140 lb (63.5 kg)   LMP 04/09/2014 (Approximate)   BMI 22.60 kg/m    Physical Exam  Constitutional: She is oriented to person, place, and time. She appears well-developed and well-nourished.  HENT:  Head: Normocephalic and atraumatic.  Eyes: Conjunctivae and EOM are normal.  Neck: Normal range of motion.  Cardiovascular: Normal rate, regular rhythm, normal heart sounds and intact distal pulses.  Pulmonary/Chest: Effort normal and breath sounds normal.  Abdominal: Soft. Bowel sounds are normal.  Lymphadenopathy:    She has no cervical adenopathy.  Neurological: She is alert and oriented to person, place, and time.  Skin: Skin is warm and dry. Capillary refill takes less than 2 seconds.  Psychiatric: She has a normal mood and affect. Her behavior is normal.  Nursing note and vitals reviewed.    Musculoskeletal Exam: C-spine and thoracic lumbar spine good range of motion. Shoulder joints although joints wrist joints are good range of motion. She has DIP PIP thickening in her  hands and CMC thickening without any synovitis. Hip joints are good range of motion. She is warmth and swelling in her right knee joint with mild effusion. Left knee joint had good range of motion without any warmth swelling or effusion. Ankle joints MTPs PIPs with good range of motion. Fibromyalgia tender points were 16 out of 18 positive. She had generalized hyperalgesia.  CDAI Exam: CDAI Homunculus Exam:   Tenderness:  RLE: tibiofemoral  Joint Counts:  CDAI Tender Joint count: 1 CDAI Swollen Joint count: 0  Global Assessments:  Patient Global Assessment: 8 Provider Global Assessment: 4  CDAI Calculated Score: 13    Investigation: November 2016 TB gold negative, hepatitis panel negative, G6PD normal, TPM T normal No additional findings.PLQ eye exam: November 2018 normal per patient CBC Latest Ref Rng & Units 07/28/2016 05/12/2016 02/06/2016  WBC 4.0 - 10.5 K/uL 3.3(L) 4.7 3.9  Hemoglobin 12.0 - 15.0 g/dL 40.9 81.1 91.4  Hematocrit 36.0 - 46.0 % 37.5 37.2 37.0  Platelets 150 - 400 K/uL 240 238 247   CMP Latest Ref Rng & Units 07/30/2016 07/29/2016 07/29/2016  Glucose 65 - 99 mg/dL 91 93 782(N)  BUN 6 - 20 mg/dL 6 6 6   Creatinine 0.44 - 1.00 mg/dL 5.62 1.30 8.65  Sodium 135 - 145 mmol/L 134(L) 125(L) 124(L)  Potassium 3.5 - 5.1 mmol/L 3.9 3.8 3.6  Chloride 101 - 111 mmol/L 106 97(L) 97(L)  CO2 22 - 32 mmol/L 20(L) 19(L) 20(L)  Calcium 8.9 - 10.3 mg/dL 8.9 8.9 9.0  Total Protein 6.5 - 8.1 g/dL 7.0 - -  Total Bilirubin 0.3 - 1.2 mg/dL 0.5 - -  Alkaline Phos 38 - 126 U/L 97 - -  AST 15 - 41 U/L 27 - -  ALT 14 - 54 U/L 20 - -    Imaging: Mm Screening Breast Tomo Bilateral  Result Date: 03/03/2017 CLINICAL DATA:  Screening. EXAM: 2D DIGITAL SCREENING BILATERAL MAMMOGRAM WITH CAD AND ADJUNCT TOMO COMPARISON:  Previous exam(s). ACR Breast Density Category b: There are scattered areas of fibroglandular density. FINDINGS: There are no findings suspicious for malignancy. Images were  processed with CAD. IMPRESSION: No mammographic evidence of malignancy. A result letter of this screening mammogram will be mailed directly to the patient. RECOMMENDATION: Screening mammogram in one year. (Code:SM-B-01Y) BI-RADS CATEGORY  1: Negative. Electronically Signed   By: Harmon PierJeffrey  Hu M.D.   On: 03/03/2017 16:09    Speciality Comments: No specialty comments available.    Procedures:  Large Joint Inj: R knee on 03/04/2017 12:19 PM Indications: pain Details: 27 G 1.5 in needle, medial approach  Arthrogram: No  Medications: 2 mL lidocaine 1 %; 40 mg triamcinolone acetonide 40 MG/ML Aspirate: 0 mL Outcome: tolerated well, no immediate complications Procedure, treatment alternatives, risks and benefits explained, specific risks discussed. Consent was given by the patient. Immediately prior to procedure a time out was called to verify the correct patient, procedure, equipment, support staff and site/side marked as required. Patient was prepped and draped in the usual sterile fashion.     Allergies: Iron sucrose; Morphine; Venofer [ferric oxide]; Azithromycin; and Other   Assessment / Plan:     Visit Diagnoses: Autoimmune disease (HCC) positive ANA, positive Ro, positive La, anticardiolipin antibody and positive rheumatoid factor -her autoimmune disease has been stable. She's been having increased pain and swelling in her right knee joint. I'm uncertain if is due to a flare of autoimmune disease versus her chondrocalcinosis. I'll obtain following labs today. Plan: Urinalysis, Routine w reflex microscopic, Anti-DNA antibody, double-stranded, C3 and C4, Cardiolipin antibodies, IgG, IgM, IgA  Sjogren's syndrome with keratoconjunctivitis sicca (HCC) - She continues to have some sicca symptoms for which she's been using over-the-counter products.  High risk medication use - PLQeye exam: 10/28/2015 - Plan: CBC with Differential/Platelet, COMPLETE METABOLIC PANEL WITH GFR today.  Primary  osteoarthritis of both hands: She has chronic pain due to underlying osteoarthritis in her hands.  Acute pain of right knee -she has significant warmth swelling and small effusion. After informed consent was obtained right knee joint was injected. The procedures described above. No fluid could be aspirated. I've advised her to continue with colchicine for right now. Plan: Uric acid, Sedimentation rate  Primary osteoarthritis of both knees: Chronic pain  Chondrocalcinosis - on Colchicine when necessary  Chondromalacia patellae, right knee  Sedimentation rate elevation - (chronic)  Fibromyalgia: She continues to have some generalized pain and discomfort   DDD (degenerative disc disease), lumbar/ and scoliosis -she has chronic pain in her lower back.  Other medical problems are listed as follows:  Other fatigue  History of migraine  History of hypothyroidism - autoimmune  Prolonged QT interval  History of diabetes insipidus - s/p pituatary tumor   History of anxiety    Orders: Orders Placed This Encounter  Procedures  .  Large Joint Inj  . CBC with Differential/Platelet  . COMPLETE METABOLIC PANEL WITH GFR  . Uric acid  . Sedimentation rate  . Urinalysis, Routine w reflex microscopic  . Anti-DNA antibody, double-stranded  . C3 and C4  . Cardiolipin antibodies, IgG, IgM, IgA   No orders of the defined types were placed in this encounter.   Face-to-face time spent with patient was 30 minutes. Greater than 50% of time was spent in counseling and coordination of care.  Follow-Up Instructions: Return for Autoimmune disease, Osteoarthritis.   Pollyann Savoy, MD  Note - This record has been created using Animal nutritionist.  Chart creation errors have been sought, but may not always  have been located. Such creation errors do not reflect on  the standard of medical care.

## 2017-03-03 NOTE — Telephone Encounter (Signed)
Patient called stating swelling in her right knee (size of gratefruit)   She wants to know if there is any way she could be seen   CB#[phone]  Thank you

## 2017-03-03 NOTE — Telephone Encounter (Signed)
Patient has been scheduled for 03/04/17 at 11:15 am to have her knee evaluated,

## 2017-03-04 ENCOUNTER — Encounter: Payer: Self-pay | Admitting: Rheumatology

## 2017-03-04 ENCOUNTER — Ambulatory Visit: Payer: 59 | Admitting: Rheumatology

## 2017-03-04 VITALS — BP 104/64 | HR 79 | Ht 66.0 in | Wt 140.0 lb

## 2017-03-04 DIAGNOSIS — M3501 Sicca syndrome with keratoconjunctivitis: Secondary | ICD-10-CM

## 2017-03-04 DIAGNOSIS — M19042 Primary osteoarthritis, left hand: Secondary | ICD-10-CM

## 2017-03-04 DIAGNOSIS — R9431 Abnormal electrocardiogram [ECG] [EKG]: Secondary | ICD-10-CM

## 2017-03-04 DIAGNOSIS — M1120 Other chondrocalcinosis, unspecified site: Secondary | ICD-10-CM | POA: Diagnosis not present

## 2017-03-04 DIAGNOSIS — M170 Bilateral primary osteoarthritis of knee: Secondary | ICD-10-CM | POA: Diagnosis not present

## 2017-03-04 DIAGNOSIS — Z8659 Personal history of other mental and behavioral disorders: Secondary | ICD-10-CM

## 2017-03-04 DIAGNOSIS — D8989 Other specified disorders involving the immune mechanism, not elsewhere classified: Secondary | ICD-10-CM | POA: Diagnosis not present

## 2017-03-04 DIAGNOSIS — M797 Fibromyalgia: Secondary | ICD-10-CM

## 2017-03-04 DIAGNOSIS — R5383 Other fatigue: Secondary | ICD-10-CM

## 2017-03-04 DIAGNOSIS — R700 Elevated erythrocyte sedimentation rate: Secondary | ICD-10-CM | POA: Diagnosis not present

## 2017-03-04 DIAGNOSIS — M25561 Pain in right knee: Secondary | ICD-10-CM | POA: Diagnosis not present

## 2017-03-04 DIAGNOSIS — M359 Systemic involvement of connective tissue, unspecified: Secondary | ICD-10-CM

## 2017-03-04 DIAGNOSIS — Z79899 Other long term (current) drug therapy: Secondary | ICD-10-CM

## 2017-03-04 DIAGNOSIS — M5136 Other intervertebral disc degeneration, lumbar region: Secondary | ICD-10-CM

## 2017-03-04 DIAGNOSIS — M2241 Chondromalacia patellae, right knee: Secondary | ICD-10-CM | POA: Diagnosis not present

## 2017-03-04 DIAGNOSIS — Z8669 Personal history of other diseases of the nervous system and sense organs: Secondary | ICD-10-CM

## 2017-03-04 DIAGNOSIS — M51369 Other intervertebral disc degeneration, lumbar region without mention of lumbar back pain or lower extremity pain: Secondary | ICD-10-CM

## 2017-03-04 DIAGNOSIS — M19041 Primary osteoarthritis, right hand: Secondary | ICD-10-CM | POA: Diagnosis not present

## 2017-03-04 DIAGNOSIS — Z8639 Personal history of other endocrine, nutritional and metabolic disease: Secondary | ICD-10-CM

## 2017-03-04 MED ORDER — LIDOCAINE HCL 1 % IJ SOLN
2.0000 mL | INTRAMUSCULAR | Status: AC | PRN
  Administered 2017-03-04: 2 mL

## 2017-03-04 MED ORDER — TRIAMCINOLONE ACETONIDE 40 MG/ML IJ SUSP
40.0000 mg | INTRAMUSCULAR | Status: AC | PRN
  Administered 2017-03-04: 40 mg via INTRA_ARTICULAR

## 2017-03-05 LAB — COMPLETE METABOLIC PANEL WITH GFR
AG Ratio: 1.4 (calc) (ref 1.0–2.5)
ALBUMIN MSPROF: 5 g/dL (ref 3.6–5.1)
ALT: 17 U/L (ref 6–29)
AST: 23 U/L (ref 10–35)
Alkaline phosphatase (APISO): 109 U/L (ref 33–130)
BUN: 13 mg/dL (ref 7–25)
CO2: 30 mmol/L (ref 20–32)
CREATININE: 0.69 mg/dL (ref 0.50–1.05)
Calcium: 10.3 mg/dL (ref 8.6–10.4)
Chloride: 101 mmol/L (ref 98–110)
GFR, EST AFRICAN AMERICAN: 112 mL/min/{1.73_m2} (ref >=60)
GFR, EST NON AFRICAN AMERICAN: 97 mL/min/{1.73_m2} (ref >=60)
GLUCOSE: 80 mg/dL (ref 65–99)
Globulin: 3.6 g/dL (calc) (ref 1.9–3.7)
Potassium: 4.3 mmol/L (ref 3.5–5.3)
Sodium: 139 mmol/L (ref 135–146)
TOTAL PROTEIN: 8.6 g/dL — AB (ref 6.1–8.1)
Total Bilirubin: 0.7 mg/dL (ref 0.2–1.2)

## 2017-03-05 LAB — URINALYSIS, ROUTINE W REFLEX MICROSCOPIC
Bilirubin Urine: NEGATIVE
Glucose, UA: NEGATIVE
HGB URINE DIPSTICK: NEGATIVE
Ketones, ur: NEGATIVE
LEUKOCYTES UA: NEGATIVE
NITRITE: NEGATIVE
PH: 7 (ref 5.0–8.0)
Protein, ur: NEGATIVE
SPECIFIC GRAVITY, URINE: 1.006 (ref 1.001–1.03)

## 2017-03-05 LAB — CBC WITH DIFFERENTIAL/PLATELET
BASOS PCT: 0.6 %
Basophils Absolute: 31 cells/uL (ref 0–200)
EOS PCT: 1.2 %
Eosinophils Absolute: 62 cells/uL (ref 15–500)
HEMATOCRIT: 46.1 % — AB (ref 35.0–45.0)
HEMOGLOBIN: 15.6 g/dL — AB (ref 11.7–15.5)
LYMPHS ABS: 1576 {cells}/uL (ref 850–3900)
MCH: 28.5 pg (ref 27.0–33.0)
MCHC: 33.8 g/dL (ref 32.0–36.0)
MCV: 84.1 fL (ref 80.0–100.0)
MONOS PCT: 8.3 %
MPV: 11.8 fL (ref 7.5–12.5)
NEUTROS ABS: 3099 {cells}/uL (ref 1500–7800)
Neutrophils Relative %: 59.6 %
Platelets: 256 10*3/uL (ref 140–400)
RBC: 5.48 10*6/uL — ABNORMAL HIGH (ref 3.80–5.10)
RDW: 13.3 % (ref 11.0–15.0)
Total Lymphocyte: 30.3 %
WBC mixed population: 432 cells/uL (ref 200–950)
WBC: 5.2 10*3/uL (ref 3.8–10.8)

## 2017-03-05 LAB — URIC ACID: Uric Acid, Serum: 5.1 mg/dL (ref 2.5–7.0)

## 2017-03-05 LAB — ANTI-DNA ANTIBODY, DOUBLE-STRANDED: ds DNA Ab: 1 IU/mL

## 2017-03-05 LAB — CARDIOLIPIN ANTIBODIES, IGG, IGM, IGA

## 2017-03-05 LAB — C3 AND C4
C3 COMPLEMENT: 153 mg/dL (ref 83–193)
C4 COMPLEMENT: 36 mg/dL (ref 15–57)

## 2017-03-05 LAB — SEDIMENTATION RATE: Sed Rate: 36 mm/h — ABNORMAL HIGH (ref 0–30)

## 2017-03-08 NOTE — Progress Notes (Signed)
High called patient and discussed lab results with her. The labs do not indicate a flare of autoimmune disease. Her sedimentation rate is mildly elevated. She also has underlying chondrocalcinosis which could be flaring. She states her knee is somewhat better after the cortisone injection but the symptoms are not completely resolved. I advised her to increase her colchicine to twice a day for a few days if tolerated. I made her aware that colchicine can cause diarrhea. She has a follow-up appointment on January 15.

## 2017-03-09 NOTE — Progress Notes (Signed)
Office Visit Note  Patient: Stefanie [name]             Date of Birth: 1960-12-16           MRN: [id number]             PCP: Shon Baton, MD Referring: Shon Baton, MD Visit Date: 03/23/2017 Occupation: '@GUAROCC' @    Subjective:  Pain in bilateral knees.   History of Present Illness: Stefanie [name] is a 57 y.o. female with history of autoimmune disease, osteoarthritis and chondrocalcinosis. She was seen last with pain and swelling in her right knee joint. She was given a cortisone injection in December. She said eventually the swelling went down. She still has some discomfort in her right knee. Recently she's been having pain in her left knee as well. She has some discomfort in her right wrist. She continues to have sicca symptoms.  Activities of Daily Living:  Patient reports morning stiffness for 15 minutes.   Patient Reports nocturnal pain. Trochanteric pain Difficulty dressing/grooming: Denies Difficulty climbing stairs: Reports Difficulty getting out of chair: Reports Difficulty using hands for taps, buttons, cutlery, and/or writing: Reports   Review of Systems  Constitutional: Positive for fatigue. Negative for night sweats, weight gain, weight loss and weakness.  HENT: Positive for mouth dryness. Negative for mouth sores, trouble swallowing, trouble swallowing and nose dryness.   Eyes: Negative for pain, redness, visual disturbance and dryness.  Respiratory: Positive for shortness of breath. Negative for cough and difficulty breathing.   Cardiovascular: Negative.  Negative for chest pain, palpitations, hypertension, irregular heartbeat and swelling in legs/feet.  Gastrointestinal: Positive for constipation. Negative for blood in stool and diarrhea.  Endocrine: Negative.  Negative for increased urination.  Genitourinary: Negative.  Negative for nocturia and vaginal dryness.  Musculoskeletal: Positive for arthralgias and joint pain. Negative for joint swelling, myalgias,  muscle weakness, morning stiffness, muscle tenderness and myalgias.  Skin: Negative.  Negative for color change, rash, hair loss, skin tightness, ulcers and sensitivity to sunlight.  Allergic/Immunologic: Negative for susceptible to infections.  Neurological: Negative.  Negative for dizziness, headaches, memory loss and night sweats.  Hematological: Negative for swollen glands.  Psychiatric/Behavioral: Negative.  Negative for depressed mood and sleep disturbance. The patient is not nervous/anxious.     PMFS History:  Patient Active Problem List   Diagnosis Date Noted  . Bursitis, ischial, right 10/08/2016  . 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 Fibromyalgia 10/08/2016  . Other fatigue 10/08/2016  . DDD (degenerative disc disease), lumbar/ and scoliosis 10/08/2016  . Facial droop   . 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 Prolonged QT interval 07/28/2016  . Primary osteoarthritis of both hands 05/11/2016  . Primary osteoarthritis of both knees 05/11/2016  . History of hypothyroidism 05/11/2016  . History of migraine 05/11/2016  . Autoimmune disease ([place]) positive ANA, positive Ro, positive La, anticardiolipin antibody and positive rheumatoid factor 04/29/2016  . ANA positive 04/29/2016  . Rheumatoid factor positive 04/29/2016  . Pseudogout 04/29/2016  . Chondrocalcinosis 04/29/2016  . High risk medication use 04/29/2016  . Sjogren's syndrome with keratoconjunctivitis sicca ([place]) 04/29/2016  . History of neutropenia 04/29/2016  . 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  . Hyponatremia 12/12/2013  . Fever 12/13/2012  . Pleuritic chest pain 12/06/2012  . DI (diabetes insipidus) ([place]) 12/06/2012  . Anxiety 12/06/2012  . ANEMIA-IRON DEFICIENCY 01/08/2010  . GASTRIC DILATION, ACUTE 01/08/2010  . NAUSEA ALONE 01/08/2010  . ABDOMINAL PAIN, LEFT UPPER QUADRANT 01/08/2010  Past Medical History:  Diagnosis Date  . Anemia   . Arthritis   . Asthma   .  Diabetes insipidus ([place])   . Fibromyalgia   . Lupus   . Mitral valve prolapse   . SLE (systemic lupus erythematosus) ([place]) 12/06/2012  . Thyroid disease     Family History  Problem Relation Age of Onset  . Diabetes Father   . Heart disease Father   . Hyperlipidemia Father   . Hypertension Father   . Stroke Father   . Diabetes Sister   . Cancer Maternal Grandmother   . Diabetes Paternal Grandfather   . Heart disease Paternal Grandfather   . Hyperlipidemia Paternal Grandfather   . Hypertension Paternal Grandfather    Past Surgical History:  Procedure Laterality Date  . NO PAST SURGERIES     Social History   Social History Narrative  . Not on file     Objective: Vital Signs: BP 116/78   Pulse 73   Resp 14   Ht '5\' 6"'  (1.676 m)   Wt 132 lb (59.9 kg)   LMP 04/09/2014 (Approximate)   BMI 21.31 kg/m    Physical Exam  Constitutional: She is oriented to person, place, and time. She appears well-developed and well-nourished.  HENT:  Head: Normocephalic and atraumatic.  Eyes: Conjunctivae and EOM are normal.  Neck: Normal range of motion.  Cardiovascular: Normal rate, regular rhythm, normal heart sounds and intact distal pulses.  Pulmonary/Chest: Effort normal and breath sounds normal.  Abdominal: Soft. Bowel sounds are normal.  Lymphadenopathy:    She has no cervical adenopathy.  Neurological: She is alert and oriented to person, place, and time.  Skin: Skin is warm and dry. Capillary refill takes less than 2 seconds.  Psychiatric: She has a normal mood and affect. Her behavior is normal.  Nursing note and vitals reviewed.    Musculoskeletal Exam: C-spine and thoracic lumbar spine good range of motion. Shoulder joints elbow joints wrist joint MCPs PIPs DIPs with good range of motion. Hip joints knee joints ankles MTPs PIPs DIPs are good range of motion. Her knee joints were without any warmth swelling or effusion.  CDAI Exam: No CDAI exam completed.     Investigation: No additional findings.PLQ eye exam: 10/28/2015 CBC Latest Ref Rng & Units 03/04/2017 07/28/2016 05/12/2016  WBC 3.8 - 10.8 Thousand/uL 5.2 3.3(L) 4.7  Hemoglobin 11.7 - 15.5 g/dL 15.6(H) 13.6 12.5  Hematocrit 35.0 - 45.0 % 46.1(H) 37.5 37.2  Platelets 140 - 400 Thousand/uL 256 240 238   CMP Latest Ref Rng & Units 03/04/2017 07/30/2016 07/29/2016  Glucose 65 - 99 mg/dL 80 91 93  BUN 7 - 25 mg/dL '13 6 6  ' Creatinine 0.50 - 1.05 mg/dL 0.69 0.68 0.48  Sodium 135 - 146 mmol/L 139 134(L) 125(L)  Potassium 3.5 - 5.3 mmol/L 4.3 3.9 3.8  Chloride 98 - 110 mmol/L 101 106 97(L)  CO2 20 - 32 mmol/L 30 20(L) 19(L)  Calcium 8.6 - 10.4 mg/dL 10.3 8.9 8.9  Total Protein 6.1 - 8.1 g/dL 8.6(H) 7.0 -  Total Bilirubin 0.2 - 1.2 mg/dL 0.7 0.5 -  Alkaline Phos 38 - 126 U/L - 97 -  AST 10 - 35 U/L 23 27 -  ALT 6 - 29 U/L 17 20 -  ESR 36, double-stranded DNA negative, C3-C4 normal, anticardiolipin negative, UA negative, uric acid 5.1 05/12/2016 RF 26, SPEP normal Imaging: Mm Screening Breast Tomo Bilateral  Result Date: 03/03/2017 CLINICAL DATA:  Screening. EXAM: 2D  DIGITAL SCREENING BILATERAL MAMMOGRAM WITH CAD AND ADJUNCT TOMO COMPARISON:  Previous exam(s). ACR Breast Density Category b: There are scattered areas of fibroglandular density. FINDINGS: There are no findings suspicious for malignancy. Images were processed with CAD. IMPRESSION: No mammographic evidence of malignancy. A result letter of this screening mammogram will be mailed directly to the patient. RECOMMENDATION: Screening mammogram in one year. (Code:SM-B-01Y) BI-RADS CATEGORY  1: Negative. Electronically Signed   By: Margarette Canada M.D.   On: 03/03/2017 16:09    Speciality Comments: No specialty comments available.    Procedures:  No procedures performed Allergies: Iron sucrose; Morphine; Venofer [ferric oxide]; Azithromycin; and Other   Assessment / Plan:     Visit Diagnoses: Autoimmune disease ([place]) positive ANA,  positive Ro, positive La, anticardiolipin antibody and positive rheumatoid factor: Her most recent labs did not show any autoimmune disease flare. Her sicca symptoms are fairly well tolerable with over-the-counter medications.  Sjogren's syndrome with keratoconjunctivitis sicca ([place]) - She continues to have some sicca symptoms for which she's been using over-the-counter products.  High risk medication use - PLQeye exam: 10/27/2016. Her eye exams has been up-to-date. Her labs have been stable. She will continue to monitor her labs every 5 months.  Primary osteoarthritis of both hands: She has mild osteoarthritis in her hands which is tolerable.  Primary osteoarthritis of both knees: She does have osteoarthritis in her knee joints which causes chronic pain. She had recent episode with right knee joint swelling and effusion I believe it was related to chondrocalcinosis. Although we could not aspirated her knee joint. She responded very well to injection and colchicine. As she is doing better advised her to decrease her colchicine to once a day.  Chondrocalcinosis - on Colchicine po  bid   Chondromalacia patellae, right knee  Fibromyalgia: She continues to have some generalized pain and discomfort from fibromyalgia.  Other fatigue: Need for regular exercise was discussed.  DDD (degenerative disc disease), lumbar/ and scoliosis: Chronic pain  Sedimentation rate elevation: She has history of chronic elevation.  Other medical problems are listed as follows:  History of anxiety  History of migraine  History of diabetes insipidus - s/p pituatary tumor  History of hypothyroidism - autoimmune  Prolonged QT interval    Orders: No orders of the defined types were placed in this encounter.  No orders of the defined types were placed in this encounter.   Face-to-face time spent with patient was 30 minutes. Greater than 50% of time was spent in counseling and coordination of  care.  Follow-Up Instructions: Return in about 5 months (around 08/21/2017) for Autoimmune disease, Osteoarthritis.   Bo Merino, MD  Note - This record has been created using Editor, commissioning.  Chart creation errors have been sought, but may not always  have been located. Such creation errors do not reflect on  the standard of medical care.

## 2017-03-23 ENCOUNTER — Encounter: Payer: Self-pay | Admitting: Rheumatology

## 2017-03-23 ENCOUNTER — Ambulatory Visit: Payer: Managed Care, Other (non HMO) | Admitting: Rheumatology

## 2017-03-23 VITALS — BP 116/78 | HR 73 | Resp 14 | Ht 66.0 in | Wt 132.0 lb

## 2017-03-23 DIAGNOSIS — R5383 Other fatigue: Secondary | ICD-10-CM

## 2017-03-23 DIAGNOSIS — R700 Elevated erythrocyte sedimentation rate: Secondary | ICD-10-CM | POA: Diagnosis not present

## 2017-03-23 DIAGNOSIS — Z8639 Personal history of other endocrine, nutritional and metabolic disease: Secondary | ICD-10-CM

## 2017-03-23 DIAGNOSIS — Z8659 Personal history of other mental and behavioral disorders: Secondary | ICD-10-CM

## 2017-03-23 DIAGNOSIS — M2241 Chondromalacia patellae, right knee: Secondary | ICD-10-CM

## 2017-03-23 DIAGNOSIS — M19041 Primary osteoarthritis, right hand: Secondary | ICD-10-CM | POA: Diagnosis not present

## 2017-03-23 DIAGNOSIS — M3501 Sicca syndrome with keratoconjunctivitis: Secondary | ICD-10-CM

## 2017-03-23 DIAGNOSIS — M5136 Other intervertebral disc degeneration, lumbar region: Secondary | ICD-10-CM | POA: Diagnosis not present

## 2017-03-23 DIAGNOSIS — M1120 Other chondrocalcinosis, unspecified site: Secondary | ICD-10-CM

## 2017-03-23 DIAGNOSIS — M170 Bilateral primary osteoarthritis of knee: Secondary | ICD-10-CM

## 2017-03-23 DIAGNOSIS — M797 Fibromyalgia: Secondary | ICD-10-CM | POA: Diagnosis not present

## 2017-03-23 DIAGNOSIS — D8989 Other specified disorders involving the immune mechanism, not elsewhere classified: Secondary | ICD-10-CM

## 2017-03-23 DIAGNOSIS — R9431 Abnormal electrocardiogram [ECG] [EKG]: Secondary | ICD-10-CM

## 2017-03-23 DIAGNOSIS — M19042 Primary osteoarthritis, left hand: Secondary | ICD-10-CM

## 2017-03-23 DIAGNOSIS — Z79899 Other long term (current) drug therapy: Secondary | ICD-10-CM

## 2017-03-23 DIAGNOSIS — Z8669 Personal history of other diseases of the nervous system and sense organs: Secondary | ICD-10-CM

## 2017-03-23 DIAGNOSIS — M359 Systemic involvement of connective tissue, unspecified: Secondary | ICD-10-CM

## 2017-05-10 NOTE — Progress Notes (Signed)
Cardiology Office Note    Date:  05/12/2017   ID:  Stefanie [name], DOB [birth year], MRN [id number]  PCP:  Creola Cornusso, John, MD  Cardiologist:  New- seen by Dr. Eden EmmsNishan in 2010  CC: chest pain / palpitations  History of Present Illness:   57 y.o. I have not seen since 2010. Seen by PA 04/23/16 for chest pain.  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 Anemia   . Arthritis   . Asthma   . Diabetes insipidus (HCC)   . Fibromyalgia   . Lupus   . Mitral valve prolapse   . SLE (systemic lupus erythematosus) (HCC) 12/06/2012  . Thyroid disease     Past Surgical History:  Procedure Laterality Date  . NO PAST SURGERIES      Current Medications: Outpatient Medications Prior to Visit  Medication Sig Dispense Refill  . albuterol (PROVENTIL HFA;VENTOLIN HFA) 108 (90 BASE) MCG/ACT inhaler Inhale 1 puff into the lungs every 6 (six) hours as needed for wheezing or shortness of breath.    . Cholecalciferol (VITAMIN D PO) Take 1 capsule by mouth daily.    . Colchicine 0.6 MG CAPS Take 1 capsule by mouth daily. 90 capsule 1  . diclofenac sodium (VOLTAREN) 1 % GEL Voltaren Gel 3 grams to 3 large joints upto TID 3 TUBES with 3 refills 3 Tube 3  . hydroxychloroquine (PLAQUENIL) 200 MG tablet TAKE 1 TABLET EVERY MORNING AND 1/2 TABLET EVERY EVENING 45 tablet 2  . levothyroxine (SYNTHROID, LEVOTHROID) 125 MCG tablet Take 125 mcg by mouth at bedtime.     . nitroGLYCERIN (NITROSTAT) 0.4 MG SL tablet Place 0.4 mg under  the tongue every 5 (five) minutes as needed for chest pain.    . rizatriptan (MAXALT) 10 MG tablet Take 10 mg by mouth daily as needed for migraine. May repeat in 2 hours if needed    . topiramate (TOPAMAX) 100 MG tablet Take 100 mg by mouth daily as needed. Migraines    . Turmeric 450 MG CAPS Take by mouth.    . hydroxychloroquine (PLAQUENIL) 200 MG tablet TAKE 1 TABLET EVERY MORNING AND 1/2 TABLET EVERY EVENING (Patient not taking: Reported on 05/12/2017) 45 tablet 2   No facility-administered medications prior to visit.      Allergies:   Iron sucrose; Morphine; Venofer [ferric oxide]; Azithromycin; and Other   Social History   Socioeconomic History  . Marital status: Married    Spouse name: None  . Number of children: None  . Years of education: None  . Highest education level: None  Social Needs  . Financial resource strain: None  . Food insecurity - worry: None  . Food insecurity - inability: None  . Transportation needs - medical: None  . Transportation needs - non-medical: None  Occupational History  . None  Tobacco Use  . Smoking status: Never Smoker  . Smokeless tobacco: Never Used  Substance and Sexual Activity  . Alcohol use: No  . Drug use: No  . Sexual activity:  None  Other Topics Concern  . None  Social History Narrative  . None     Family History:  The patient's family history includes Cancer in her maternal grandmother; Diabetes in her father, paternal grandfather, and sister; Heart disease in her father and paternal grandfather; Hyperlipidemia in her father and paternal grandfather; Hypertension in her father and paternal grandfather; Stroke in her father.      ROS:   Please see the history of present illness.    ROS All other systems reviewed and are negative.   PHYSICAL EXAM:    Vitals:   05/12/17 1536  BP: 110/72  Pulse: 83  SpO2: 98%   Affect appropriate Healthy:  appears stated age HEENT: normal Neck supple with no adenopathy JVP normal  no bruits no thyromegaly Lungs clear with no wheezing and good diaphragmatic motion Heart:  S1/S2 MR murmur heard LLDP , no rub, gallop or click PMI normal Abdomen: benighn, BS positve, no tenderness, no AAA no bruit.  No HSM or HJR Distal pulses intact with no bruits No edema Neuro non-focal Skin warm and dry No muscular weakness   Wt Readings from Last 3 Encounters:  05/12/17 143 lb 8 oz (65.1 kg)  03/23/17 132 lb (59.9 kg)  03/04/17 140 lb (63.5 kg)      Studies/Labs Reviewed:   EKG:  07/29/16 SR rate 73 PR 223 otherwise normal   Recent Labs: 03/04/2017: ALT 17; BUN 13; Creat 0.69; Hemoglobin 15.6; Platelets 256; Potassium 4.3; Sodium 139   Lipid Panel    Component Value Date/Time   CHOL 153 04/23/2016 1008   TRIG 59 04/23/2016 1008   HDL 60 04/23/2016 1008   CHOLHDL 2.6 04/23/2016 1008   LDLCALC 81 04/23/2016 1008    Additional studies/ records that were reviewed today include:  See HPI Echo , Holter and Myovue 2018    ASSESSMENT & PLAN:   Chest pain/dyspnea on exertion: atypical likely related to autoimmune disease myovue  05/18/16 normal   Palpitations: benign holter 04/2016 observe   SLE: on plaquenil, followed by rheum. Sjogren's   Hyponatremia: followed closely by Dr Timothy Lasso Pituitary resection DDAVP  Thyroid: on replacement labs with primary   Murmur:  MR heard best in left lateral decubitus position f/u echo in a year if stable Can observe   Charlton Haws

## 2017-05-12 ENCOUNTER — Ambulatory Visit: Payer: 59 | Admitting: Cardiovascular Disease

## 2017-05-12 ENCOUNTER — Encounter: Payer: Self-pay | Admitting: Cardiovascular Disease

## 2017-05-12 VITALS — BP 110/72 | HR 83 | Ht 65.0 in | Wt 143.5 lb

## 2017-05-12 DIAGNOSIS — I059 Rheumatic mitral valve disease, unspecified: Secondary | ICD-10-CM | POA: Diagnosis not present

## 2017-05-12 DIAGNOSIS — R002 Palpitations: Secondary | ICD-10-CM

## 2017-05-12 DIAGNOSIS — R079 Chest pain, unspecified: Secondary | ICD-10-CM | POA: Diagnosis not present

## 2017-05-12 NOTE — Patient Instructions (Addendum)
Medication Instructions:  Your physician recommends that you continue on your current medications as directed. Please refer to the Current Medication list given to you today.  Labwork: NONE  Testing/Procedures: Your physician has requested that you have an echocardiogram in 1 year. Echocardiography is a painless test that uses sound waves to create images of your heart. It provides your doctor with information about the size and shape of your heart and how well your heart's chambers and valves are working. This procedure takes approximately one hour. There are no restrictions for this procedure.  Follow-Up: Your physician wants you to follow-up in: 12 months with Dr. Nishan. You will receive a reminder letter in the mail two months in advance. If you don't receive a letter, please call our office to schedule the follow-up appointment.   If you need a refill on your cardiac medications before your next appointment, please call your pharmacy.    

## 2017-06-03 ENCOUNTER — Other Ambulatory Visit: Payer: Self-pay | Admitting: Rheumatology

## 2017-06-03 NOTE — Telephone Encounter (Signed)
Last Visit: 03/23/17 Next Visit: 09/24/17 Labs: 03/04/17 cbc/cmp stable  Okay to refill per Dr. Corliss Skainseveshwar

## 2017-07-19 ENCOUNTER — Telehealth: Payer: Self-pay | Admitting: Rheumatology

## 2017-07-19 NOTE — Telephone Encounter (Signed)
Ok to give rx  

## 2017-07-19 NOTE — Telephone Encounter (Signed)
Patient called requesting a prescription for massage therapy.  Patient states approximately 5 years ago Dr. Corliss Skains gave her one, but she never used it.  Patient requests a return call when she can pick up the prescription.

## 2017-07-19 NOTE — Telephone Encounter (Signed)
Left message to advise patient prescription for massage therapy is ready for pick up.

## 2017-08-15 ENCOUNTER — Other Ambulatory Visit: Payer: Self-pay | Admitting: Rheumatology

## 2017-08-16 NOTE — Telephone Encounter (Addendum)
Last Visit: 03/23/17 Next Visit: 09/24/17 Labs: 03/04/17 cbc/cmp stable PLQ eye exam: 10/28/2015  Left message to advise patient we need updated labs and updated PLQ eye exam.  Okay to refill 30 day supply per Dr. Corliss Skainseveshwar

## 2017-09-10 NOTE — Progress Notes (Signed)
Office Visit Note  Patient: Stefanie [name]             Date of Birth: [birth year]           MRN: [id number]             PCP: Shon Baton, MD Referring: Shon Baton, MD Visit Date: 09/24/2017 Occupation: '@GUAROCC' @    Subjective:  Arthritis (Neck pain severe, right ankle pain)   History of Present Illness: Stefanie [name] is a 57 y.o. female with history of autoimmune disease, osteoarthritis and chondrocalcinosis.  She states she has been having increased pain and stiffness in her C-spine for last few months.  The pain has increased over the last 2 to 3 months with decreased range of motion and stiffness.  She states she had a fall about a month ago when she landed on her right shoulder right knee and had some bruising from that.  She has been also having increased pain and and swelling in her right ankle for the last 3 weeks.  She continues to have discomfort in her bilateral knee joints.  Activities of Daily Living:  Patient reports morning stiffness for 45 minutes.   Patient Reports nocturnal pain.  Difficulty dressing/grooming: Reports Difficulty climbing stairs: Reports Difficulty getting out of chair: Reports Difficulty using hands for taps, buttons, cutlery, and/or writing: Reports   Review of Systems  Constitutional: Positive for fatigue. Negative for activity change, night sweats, weight gain and weight loss.  HENT: Positive for mouth dryness. Negative for mouth sores, trouble swallowing, trouble swallowing and nose dryness.   Eyes: Positive for dryness. Negative for pain, redness and visual disturbance.  Respiratory: Negative for cough, shortness of breath and difficulty breathing.   Cardiovascular: Negative for chest pain, palpitations, hypertension, irregular heartbeat and swelling in legs/feet.  Gastrointestinal: Positive for constipation. Negative for blood in stool and diarrhea.  Endocrine: Negative for cold intolerance and increased urination.  Genitourinary:  Negative for difficulty urinating and vaginal dryness.  Musculoskeletal: Positive for arthralgias, joint pain, morning stiffness and muscle tenderness. Negative for gait problem, joint swelling, myalgias, muscle weakness and myalgias.  Skin: Negative for color change, rash, hair loss, skin tightness, ulcers and sensitivity to sunlight.  Allergic/Immunologic: Negative for susceptible to infections.  Neurological: Negative for dizziness, light-headedness, numbness, memory loss, night sweats and weakness.  Hematological: Positive for bruising/bleeding tendency. Negative for swollen glands.  Psychiatric/Behavioral: Positive for sleep disturbance. Negative for depressed mood. The patient is not nervous/anxious.     PMFS History:  Patient Active Problem List   Diagnosis Date Noted  . Bursitis, ischial, right 10/08/2016  . 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 Fibromyalgia 10/08/2016  . Other fatigue 10/08/2016  . DDD (degenerative disc disease), lumbar/ and scoliosis 10/08/2016  . Facial droop   . 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 Prolonged QT interval 07/28/2016  . Primary osteoarthritis of both hands 05/11/2016  . Primary osteoarthritis of both knees 05/11/2016  . History of hypothyroidism 05/11/2016  . History of migraine 05/11/2016  . 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 ANA positive 04/29/2016  . Rheumatoid factor positive 04/29/2016  . Pseudogout 04/29/2016  . Chondrocalcinosis 04/29/2016  . High risk medication use 04/29/2016  . Sjogren's syndrome with keratoconjunctivitis sicca ([place]) 04/29/2016  . History of neutropenia 04/29/2016  . 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  . Hyponatremia 12/12/2013  . Fever 12/13/2012  . Pleuritic chest pain 12/06/2012  .  DI (diabetes insipidus) ([place]) 12/06/2012  . Anxiety 12/06/2012  . ANEMIA-IRON DEFICIENCY  01/08/2010  . GASTRIC DILATION, ACUTE 01/08/2010  . NAUSEA ALONE 01/08/2010  . ABDOMINAL PAIN, LEFT UPPER QUADRANT 01/08/2010    Past Medical History:  Diagnosis Date  . Anemia   . Arthritis   . Asthma   . Diabetes insipidus ([place])   . Fibromyalgia   . Lupus ([place])   . Mitral valve prolapse   . SLE (systemic lupus erythematosus) ([place]) 12/06/2012  . Thyroid disease     Family History  Problem Relation Age of Onset  . Diabetes Father   . Heart disease Father   . Hyperlipidemia Father   . Hypertension Father   . Stroke Father   . Diabetes Sister   . Cancer Maternal Grandmother   . Diabetes Paternal Grandfather   . Heart disease Paternal Grandfather   . Hyperlipidemia Paternal Grandfather   . Hypertension Paternal Grandfather    Past Surgical History:  Procedure Laterality Date  . PITUITARY EXCISION     Social History   Social History Narrative  . Not on file     Objective: Vital Signs: BP 101/69 (BP Location: Left Arm, Patient Position: Sitting, Cuff Size: Normal)   Pulse 83   Resp 14   Ht '5\' 7"'  (1.702 m)   Wt 146 lb (66.2 kg)   LMP 04/09/2014 (Approximate)   BMI 22.87 kg/m    Physical Exam  Constitutional: She is oriented to person, place, and time. She appears well-developed and well-nourished.  HENT:  Head: Normocephalic and atraumatic.  Eyes: Conjunctivae and EOM are normal.  Neck: Normal range of motion.  Cardiovascular: Normal rate, regular rhythm, normal heart sounds and intact distal pulses.  Pulmonary/Chest: Effort normal and breath sounds normal.  Abdominal: Soft. Bowel sounds are normal.  Lymphadenopathy:    She has no cervical adenopathy.  Neurological: She is alert and oriented to person, place, and time.  Skin: Skin is warm and dry. Capillary refill takes less than 2 seconds.  Psychiatric: She has a normal mood and affect. Her behavior is normal.  Nursing note and vitals reviewed.    Musculoskeletal Exam: She has discomfort with range of  motion of the cervical spine.  PIP and DIP thickening consistent with osteoarthritis.  Hip joints and knee joints are good range of motion.  She has some swelling over the medial aspect of her she had painful range of motion of lumbar spine which was limited.  Shoulder joints elbow joints were in good range of motion.  She has swelling tenderness over right ankle.  CDAI Exam: No CDAI exam completed.    Investigation: No additional findings. CBC Latest Ref Rng & Units 03/04/2017 07/28/2016 05/12/2016  WBC 3.8 - 10.8 Thousand/uL 5.2 3.3(L) 4.7  Hemoglobin 11.7 - 15.5 g/dL 15.6(H) 13.6 12.5  Hematocrit 35.0 - 45.0 % 46.1(H) 37.5 37.2  Platelets 140 - 400 Thousand/uL 256 240 238   CMP Latest Ref Rng & Units 03/04/2017 07/30/2016 07/29/2016  Glucose 65 - 99 mg/dL 80 91 93  BUN 7 - 25 mg/dL '13 6 6  ' Creatinine 0.50 - 1.05 mg/dL 0.69 0.68 0.48  Sodium 135 - 146 mmol/L 139 134(L) 125(L)  Potassium 3.5 - 5.3 mmol/L 4.3 3.9 3.8  Chloride 98 - 110 mmol/L 101 106 97(L)  CO2 20 - 32 mmol/L 30 20(L) 19(L)  Calcium 8.6 - 10.4 mg/dL 10.3 8.9 8.9  Total Protein 6.1 - 8.1 g/dL 8.6(H) 7.0 -  Total Bilirubin 0.2 -  1.2 mg/dL 0.7 0.5 -  Alkaline Phos 38 - 126 U/L - 97 -  AST 10 - 35 U/L 23 27 -  ALT 6 - 29 U/L 17 20 -    Imaging: Xr Ankle 2 Views Right  Result Date: 09/24/2017 No tibiotalar or subtalar joint space narrowing was noted.  No fracture was noted.  Xr Cervical Spine 2 Or 3 Views  Result Date: 09/24/2017 Multilevel mild spondylosis was noted.  C5-6 and C6-7 narrowing was noted.  Mild facet joint arthropathy was noted.   Speciality Comments: No specialty comments available.    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  There are no other clinical findings.  Plan: Sedimentation rate, C3 and C4, Anti-DNA antibody, double-stranded  Sjogren's syndrome with keratoconjunctivitis sicca (HCC)-she has been using over-the-counter products which are helpful.  High risk medication use - PLQ 300 mg p.o. dailyeye exam 12/2016 - Plan: CBC with Differential/Platelet, COMPLETE METABOLIC PANEL WITH GFR today and then every 5 months.  Neck pain -she has been experiencing increased pain and stiffness in her cervical spine.  Plan: XR Cervical Spine 2 or 3 views.  The x-ray showed mild spondylosis and facet joint arthropathy.  Have given her C-spine exercises to do.  Acute right ankle pain -she had recent fall.  She has noticed some swelling and discomfort in her right ankle.  Her ankle was tender to touch.  Plan: XR Ankle 2 Views Right.  The x-ray of the ankle joint was unremarkable.  Primary osteoarthritis of both hands-joint protection muscle strengthening was discussed.  Primary osteoarthritis of both knees-she has chronic pain.  Chondrocalcinosis - on Colchicine po which has been helpful.  Chondromalacia patellae, right knee  Fibromyalgia-she continues to have generalized pain and discomfort.  Other fatigue-persist.  DDD (degenerative disc disease), lumbar/ and scoliosis-she has chronic lower back pain.  Sedimentation rate elevation-she has chronic elevation of ESR which has been stable.  Other medical problems are listed as follows:  Prolonged QT interval  History of anxiety  History of diabetes insipidus - s/p pituatary tumor  History of migraine  History of hypothyroidism - autoimmune    Orders: Orders Placed This Encounter  Procedures  . XR Ankle 2 Views Right  . XR Cervical Spine 2 or 3 views  . CBC with Differential/Platelet  . COMPLETE METABOLIC PANEL WITH GFR  . Sedimentation rate  . C3 and C4  . Anti-DNA antibody, double-stranded   No orders of the defined types were placed in  this encounter.   Face-to-face time spent with patient was 30 minutes. Greater than 50% of time was spent in counseling and coordination of care.  Follow-Up Instructions: Return in about 5 months (around 02/24/2018) for Autoimmune disease, OA , FMS.   Bo Merino, MD  Note - This record has been created using Editor, commissioning.  Chart creation errors have been sought, but may not always  have been located. Such creation errors do not reflect on  the standard of medical care.

## 2017-09-24 ENCOUNTER — Ambulatory Visit (INDEPENDENT_AMBULATORY_CARE_PROVIDER_SITE_OTHER): Payer: 59

## 2017-09-24 ENCOUNTER — Ambulatory Visit: Payer: 59 | Admitting: Rheumatology

## 2017-09-24 ENCOUNTER — Encounter: Payer: Self-pay | Admitting: Rheumatology

## 2017-09-24 VITALS — BP 101/69 | HR 83 | Resp 14 | Ht 67.0 in | Wt 146.0 lb

## 2017-09-24 DIAGNOSIS — M170 Bilateral primary osteoarthritis of knee: Secondary | ICD-10-CM | POA: Diagnosis not present

## 2017-09-24 DIAGNOSIS — M3501 Sicca syndrome with keratoconjunctivitis: Secondary | ICD-10-CM

## 2017-09-24 DIAGNOSIS — M542 Cervicalgia: Secondary | ICD-10-CM

## 2017-09-24 DIAGNOSIS — Z8659 Personal history of other mental and behavioral disorders: Secondary | ICD-10-CM

## 2017-09-24 DIAGNOSIS — D8989 Other specified disorders involving the immune mechanism, not elsewhere classified: Secondary | ICD-10-CM

## 2017-09-24 DIAGNOSIS — M1120 Other chondrocalcinosis, unspecified site: Secondary | ICD-10-CM

## 2017-09-24 DIAGNOSIS — Z79899 Other long term (current) drug therapy: Secondary | ICD-10-CM

## 2017-09-24 DIAGNOSIS — M25571 Pain in right ankle and joints of right foot: Secondary | ICD-10-CM

## 2017-09-24 DIAGNOSIS — R5383 Other fatigue: Secondary | ICD-10-CM

## 2017-09-24 DIAGNOSIS — R700 Elevated erythrocyte sedimentation rate: Secondary | ICD-10-CM

## 2017-09-24 DIAGNOSIS — M2241 Chondromalacia patellae, right knee: Secondary | ICD-10-CM

## 2017-09-24 DIAGNOSIS — M19042 Primary osteoarthritis, left hand: Secondary | ICD-10-CM

## 2017-09-24 DIAGNOSIS — M797 Fibromyalgia: Secondary | ICD-10-CM

## 2017-09-24 DIAGNOSIS — M5136 Other intervertebral disc degeneration, lumbar region: Secondary | ICD-10-CM | POA: Diagnosis not present

## 2017-09-24 DIAGNOSIS — M359 Systemic involvement of connective tissue, unspecified: Secondary | ICD-10-CM

## 2017-09-24 DIAGNOSIS — M51369 Other intervertebral disc degeneration, lumbar region without mention of lumbar back pain or lower extremity pain: Secondary | ICD-10-CM

## 2017-09-24 DIAGNOSIS — M19041 Primary osteoarthritis, right hand: Secondary | ICD-10-CM | POA: Diagnosis not present

## 2017-09-24 DIAGNOSIS — Z8669 Personal history of other diseases of the nervous system and sense organs: Secondary | ICD-10-CM

## 2017-09-24 DIAGNOSIS — Z8639 Personal history of other endocrine, nutritional and metabolic disease: Secondary | ICD-10-CM

## 2017-09-24 DIAGNOSIS — R9431 Abnormal electrocardiogram [ECG] [EKG]: Secondary | ICD-10-CM

## 2017-09-24 NOTE — Patient Instructions (Signed)
Cervical Strain and Sprain Rehab Ask your health care provider which exercises are safe for you. Do exercises exactly as told by your health care provider and adjust them as directed. It is normal to feel mild stretching, pulling, tightness, or discomfort as you do these exercises, but you should stop right away if you feel sudden pain or your pain gets worse.Do not begin these exercises until told by your health care provider. Stretching and range of motion exercises These exercises warm up your muscles and joints and improve the movement and flexibility of your neck. These exercises also help to relieve pain, numbness, and tingling. Exercise A: Cervical side bend  1. Using good posture, sit on a stable chair or stand up. 2. Without moving your shoulders, slowly tilt your left / right ear to your shoulder until you feel a stretch in your neck muscles. You should be looking straight ahead. 3. Hold for __________ seconds. 4. Repeat with the other side of your neck. Repeat __________ times. Complete this exercise __________ times a day. Exercise B: Cervical rotation  1. Using good posture, sit on a stable chair or stand up. 2. Slowly turn your head to the side as if you are looking over your left / right shoulder. ? Keep your eyes level with the ground. ? Stop when you feel a stretch along the side and the back of your neck. 3. Hold for __________ seconds. 4. Repeat this by turning to your other side. Repeat __________ times. Complete this exercise __________ times a day. Exercise C: Thoracic extension and pectoral stretch 1. Roll a towel or a small blanket so it is about 4 inches (10 cm) in diameter. 2. Lie down on your back on a firm surface. 3. Put the towel lengthwise, under your spine in the middle of your back. It should not be not under your shoulder blades. The towel should line up with your spine from your middle back to your lower back. 4. Put your hands behind your head and let your  elbows fall out to your sides. 5. Hold for __________ seconds. Repeat __________ times. Complete this exercise __________ times a day. Strengthening exercises These exercises build strength and endurance in your neck. Endurance is the ability to use your muscles for a long time, even after your muscles get tired. Exercise D: Upper cervical flexion, isometric 1. Lie on your back with a thin pillow behind your head and a small rolled-up towel under your neck. 2. Gently tuck your chin toward your chest and nod your head down to look toward your feet. Do not lift your head off the pillow. 3. Hold for __________ seconds. 4. Release the tension slowly. Relax your neck muscles completely before you repeat this exercise. Repeat __________ times. Complete this exercise __________ times a day. Exercise E: Cervical extension, isometric  1. Stand about 6 inches (15 cm) away from a wall, with your back facing the wall. 2. Place a soft object, about 6-8 inches (15-20 cm) in diameter, between the back of your head and the wall. A soft object could be a small pillow, a ball, or a folded towel. 3. Gently tilt your head back and press into the soft object. Keep your jaw and forehead relaxed. 4. Hold for __________ seconds. 5. Release the tension slowly. Relax your neck muscles completely before you repeat this exercise. Repeat __________ times. Complete this exercise __________ times a day. Posture and body mechanics  Body mechanics refers to the movements and positions of   your body while you do your daily activities. Posture is part of body mechanics. Good posture and healthy body mechanics can help to relieve stress in your body's tissues and joints. Good posture means that your spine is in its natural S-curve position (your spine is neutral), your shoulders are pulled back slightly, and your head is not tipped forward. The following are general guidelines for applying improved posture and body mechanics to  your everyday activities. Standing  When standing, keep your spine neutral and keep your feet about hip-width apart. Keep a slight bend in your knees. Your ears, shoulders, and hips should line up.  When you do a task in which you stand in one place for a long time, place one foot up on a stable object that is 2-4 inches (5-10 cm) high, such as a footstool. This helps keep your spine neutral. Sitting   When sitting, keep your spine neutral and your keep feet flat on the floor. Use a footrest, if necessary, and keep your thighs parallel to the floor. Avoid rounding your shoulders, and avoid tilting your head forward.  When working at a desk or a computer, keep your desk at a height where your hands are slightly lower than your elbows. Slide your chair under your desk so you are close enough to maintain good posture.  When working at a computer, place your monitor at a height where you are looking straight ahead and you do not have to tilt your head forward or downward to look at the screen. Resting When lying down and resting, avoid positions that are most painful for you. Try to support your neck in a neutral position. You can use a contour pillow or a small rolled-up towel. Your pillow should support your neck but not push on it. This information is not intended to replace advice given to you by your health care provider. Make sure you discuss any questions you have with your health care provider. Document Released: 02/23/2005 Document Revised: 10/31/2015 Document Reviewed: 01/30/2015 Elsevier Interactive Patient Education  2018 [employer]  

## 2017-09-28 LAB — CBC WITH DIFFERENTIAL/PLATELET
BASOS ABS: 41 {cells}/uL (ref 0–200)
Basophils Relative: 0.8 %
Eosinophils Absolute: 102 cells/uL (ref 15–500)
Eosinophils Relative: 2 %
HCT: 39.4 % (ref 35.0–45.0)
Hemoglobin: 13.2 g/dL (ref 11.7–15.5)
Lymphs Abs: 1295 cells/uL (ref 850–3900)
MCH: 29.1 pg (ref 27.0–33.0)
MCHC: 33.5 g/dL (ref 32.0–36.0)
MCV: 87 fL (ref 80.0–100.0)
MONOS PCT: 10.1 %
MPV: 11.7 fL (ref 7.5–12.5)
Neutro Abs: 3147 cells/uL (ref 1500–7800)
Neutrophils Relative %: 61.7 %
PLATELETS: 228 10*3/uL (ref 140–400)
RBC: 4.53 10*6/uL (ref 3.80–5.10)
RDW: 12.6 % (ref 11.0–15.0)
TOTAL LYMPHOCYTE: 25.4 %
WBC: 5.1 10*3/uL (ref 3.8–10.8)
WBCMIX: 515 {cells}/uL (ref 200–950)

## 2017-09-28 LAB — COMPLETE METABOLIC PANEL WITH GFR
AG Ratio: 1.4 (calc) (ref 1.0–2.5)
ALBUMIN MSPROF: 4.4 g/dL (ref 3.6–5.1)
ALT: 24 U/L (ref 6–29)
AST: 29 U/L (ref 10–35)
Alkaline phosphatase (APISO): 96 U/L (ref 33–130)
BUN: 15 mg/dL (ref 7–25)
CALCIUM: 9.5 mg/dL (ref 8.6–10.4)
CO2: 26 mmol/L (ref 20–32)
CREATININE: 0.74 mg/dL (ref 0.50–1.05)
Chloride: 104 mmol/L (ref 98–110)
GFR, EST AFRICAN AMERICAN: 104 mL/min/{1.73_m2} (ref >=60)
GFR, EST NON AFRICAN AMERICAN: 90 mL/min/{1.73_m2} (ref >=60)
GLOBULIN: 3.1 g/dL (ref 1.9–3.7)
GLUCOSE: 87 mg/dL (ref 65–99)
Potassium: 4 mmol/L (ref 3.5–5.3)
SODIUM: 140 mmol/L (ref 135–146)
TOTAL PROTEIN: 7.5 g/dL (ref 6.1–8.1)
Total Bilirubin: 0.5 mg/dL (ref 0.2–1.2)

## 2017-09-28 LAB — C3 AND C4
C3 COMPLEMENT: 131 mg/dL (ref 83–193)
C4 Complement: 29 mg/dL (ref 15–57)

## 2017-09-28 LAB — ANTI-DNA ANTIBODY, DOUBLE-STRANDED: ds DNA Ab: <1 IU/mL

## 2017-09-28 LAB — SEDIMENTATION RATE: SED RATE: 25 mm/h (ref 0–30)

## 2017-11-25 ENCOUNTER — Ambulatory Visit: Payer: 59 | Admitting: Physician Assistant

## 2017-11-25 ENCOUNTER — Telehealth: Payer: Self-pay | Admitting: Rheumatology

## 2017-11-25 ENCOUNTER — Encounter: Payer: Self-pay | Admitting: Physician Assistant

## 2017-11-25 VITALS — BP 124/83 | HR 78 | Resp 14 | Ht 66.0 in | Wt 150.8 lb

## 2017-11-25 DIAGNOSIS — M2241 Chondromalacia patellae, right knee: Secondary | ICD-10-CM

## 2017-11-25 DIAGNOSIS — M1120 Other chondrocalcinosis, unspecified site: Secondary | ICD-10-CM

## 2017-11-25 DIAGNOSIS — M19079 Primary osteoarthritis, unspecified ankle and foot: Secondary | ICD-10-CM

## 2017-11-25 DIAGNOSIS — M25571 Pain in right ankle and joints of right foot: Secondary | ICD-10-CM | POA: Diagnosis not present

## 2017-11-25 DIAGNOSIS — R5383 Other fatigue: Secondary | ICD-10-CM

## 2017-11-25 DIAGNOSIS — M3501 Sicca syndrome with keratoconjunctivitis: Secondary | ICD-10-CM | POA: Diagnosis not present

## 2017-11-25 DIAGNOSIS — Z8669 Personal history of other diseases of the nervous system and sense organs: Secondary | ICD-10-CM

## 2017-11-25 DIAGNOSIS — D8989 Other specified disorders involving the immune mechanism, not elsewhere classified: Secondary | ICD-10-CM | POA: Diagnosis not present

## 2017-11-25 DIAGNOSIS — Z79899 Other long term (current) drug therapy: Secondary | ICD-10-CM

## 2017-11-25 DIAGNOSIS — M797 Fibromyalgia: Secondary | ICD-10-CM

## 2017-11-25 DIAGNOSIS — M359 Systemic involvement of connective tissue, unspecified: Secondary | ICD-10-CM

## 2017-11-25 DIAGNOSIS — Z8639 Personal history of other endocrine, nutritional and metabolic disease: Secondary | ICD-10-CM

## 2017-11-25 DIAGNOSIS — M47812 Spondylosis without myelopathy or radiculopathy, cervical region: Secondary | ICD-10-CM

## 2017-11-25 DIAGNOSIS — R9431 Abnormal electrocardiogram [ECG] [EKG]: Secondary | ICD-10-CM

## 2017-11-25 DIAGNOSIS — M19042 Primary osteoarthritis, left hand: Secondary | ICD-10-CM

## 2017-11-25 DIAGNOSIS — M170 Bilateral primary osteoarthritis of knee: Secondary | ICD-10-CM

## 2017-11-25 DIAGNOSIS — Z8659 Personal history of other mental and behavioral disorders: Secondary | ICD-10-CM

## 2017-11-25 DIAGNOSIS — M19041 Primary osteoarthritis, right hand: Secondary | ICD-10-CM

## 2017-11-25 NOTE — Telephone Encounter (Signed)
Patient denies any injury to her foot or fracture to her foot or ankle. Patient scheduled for 11/25/17 at 3:15 pm.

## 2017-11-25 NOTE — Progress Notes (Addendum)
Office Visit Note  Patient: Stefanie [name]             Date of Birth: 1960/12/20           MRN: [id number]             PCP: Creola Cornusso, John, MD Referring: Creola Cornusso, John, MD Visit Date: 11/25/2017 Occupation: @GUAROCC @  Subjective:  Right ankle swelling   History of Present Illness: Stefanie [name] is a 57 y.o. female with history of autoimmune disease, Sjogren's, and osteoarthritis.  Patient is on PLQ 200 mg 1 tablet by mouth in the morning and 1/2 tablet po daily. She denies missing any doses of PLQ recently. She has been having right ankle swelling, pain, and joint stiffness for the past 1-2 months.  She states the pain is most severe with dorsiflexion.  She denies any overuse activities or change in activity recently. She had a x-ray at her last visit which was normal but her symptoms have persisted. She continues to have chronic pain in both knee joints.  She states her knees having been giving out on her more frequently leading to increased falls.  She states she has mild swelling of both knee joints. She reports that fibromyalgia is also flaring.  She is having generalized muscle aches and tenderness.  She has trapezius muscle tension bilaterally. She has been having increased neck pain and stiffness.  She denies any recent rashes, sun sensitivity, or hair loss.  She continues to have intermittent symptoms of Raynaud's but denies digital ulcerations.   She continues to have sicca symptoms and uses biotene products and eye drops.  She states she has been having increased difficulty breathing for the past 3-4 weeks.  She denies any coughing or recent fevers.  She denies any palpitations. She has had worsening fatigue.     Activities of Daily Living:  Patient reports morning stiffness all day.   Patient Reports nocturnal pain.  Difficulty dressing/grooming: Denies Difficulty climbing stairs: Reports Difficulty getting out of chair: Reports Difficulty using hands for taps, buttons, cutlery,  and/or writing: Reports  Review of Systems  Constitutional: Positive for fatigue.  HENT: Positive for mouth dryness. Negative for mouth sores, trouble swallowing, trouble swallowing and nose dryness.   Eyes: Positive for dryness. Negative for pain and visual disturbance.  Respiratory: Negative for cough, hemoptysis, shortness of breath and difficulty breathing.   Cardiovascular: Negative for palpitations, hypertension and swelling in legs/feet.  Gastrointestinal: Positive for constipation. Negative for abdominal pain, blood in stool, diarrhea, nausea and vomiting.  Endocrine: Negative for increased urination.  Genitourinary: Negative for painful urination, nocturia and pelvic pain.  Musculoskeletal: Positive for arthralgias, gait problem, joint pain, joint swelling and morning stiffness. Negative for myalgias, muscle weakness, muscle tenderness and myalgias.  Skin: Negative for color change, pallor, rash, hair loss, nodules/bumps, skin tightness, ulcers and sensitivity to sunlight.  Allergic/Immunologic: Negative for susceptible to infections.  Neurological: Negative for dizziness, light-headedness, numbness, headaches and memory loss.  Hematological: Negative for swollen glands.  Psychiatric/Behavioral: Negative for depressed mood, confusion and sleep disturbance. The patient is not nervous/anxious.     PMFS History:  Patient Active Problem List   Diagnosis Date Noted  . Bursitis, ischial, right 10/08/2016  . 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 Fibromyalgia 10/08/2016  . Other fatigue 10/08/2016  . DDD (degenerative disc disease), lumbar/ and scoliosis 10/08/2016  . Facial droop   . 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 Prolonged QT interval 07/28/2016  . Primary osteoarthritis  of both hands 05/11/2016  . Primary osteoarthritis of both knees 05/11/2016  . History of hypothyroidism 05/11/2016  . History of migraine 05/11/2016  . 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 ANA positive 04/29/2016  . Rheumatoid factor positive 04/29/2016  . Pseudogout 04/29/2016  . Chondrocalcinosis 04/29/2016  . High risk medication use 04/29/2016  . Sjogren's syndrome with keratoconjunctivitis sicca (HCC) 04/29/2016  . History of neutropenia 04/29/2016  . 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  . Hyponatremia 12/12/2013  . Fever 12/13/2012  . Pleuritic chest pain 12/06/2012  . 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 Anxiety 12/06/2012  . ANEMIA-IRON DEFICIENCY 01/08/2010  . GASTRIC DILATION, ACUTE 01/08/2010  . NAUSEA ALONE 01/08/2010  . ABDOMINAL PAIN, LEFT UPPER QUADRANT 01/08/2010    Past Medical History:  Diagnosis Date  . Anemia   . Arthritis   . Asthma   . Diabetes insipidus (HCC)   . Fibromyalgia   . Lupus (HCC)   . Mitral valve prolapse   . SLE (systemic lupus erythematosus) (HCC) 12/06/2012  . Thyroid disease     Family History  Problem Relation Age of Onset  . Diabetes Father   . Heart disease Father   . Hyperlipidemia Father   . Hypertension Father   . Stroke Father   . Diabetes Sister   . Cancer Maternal Grandmother   . Diabetes Paternal Grandfather   . Heart disease Paternal Grandfather   . Hyperlipidemia Paternal Grandfather   . Hypertension Paternal Grandfather    Past Surgical History:  Procedure Laterality Date  . PITUITARY EXCISION     Social History   Social History Narrative  . Not on file    Objective: Vital Signs: BP 124/83 (BP Location: Left Arm, Patient Position: Sitting, Cuff Size: Normal)   Pulse 78   Resp 14   Ht 5\' 6"  (1.676 m)   Wt 150 lb 12.8 oz (68.4 kg)   LMP 04/09/2014 (Approximate)   BMI 24.34 kg/m    Physical Exam  Constitutional: She is oriented to person, place, and time. She appears well-developed and well-nourished.  HENT:  Head: Normocephalic and atraumatic.  Eyes: Conjunctivae  and EOM are normal.  Neck: Normal range of motion.  Cardiovascular: Normal rate, regular rhythm, normal heart sounds and intact distal pulses.  Pulmonary/Chest: Effort normal and breath sounds normal.  Abdominal: Soft. Bowel sounds are normal.  Lymphadenopathy:    She has no cervical adenopathy.  Neurological: She is alert and oriented to person, place, and time.  Skin: Skin is warm and dry. Capillary refill takes less than 2 seconds.  Psychiatric: She has a normal mood and affect. Her behavior is normal.  Nursing note and vitals reviewed.    Musculoskeletal Exam: C-spine slightly limited range of motion with lateral rotation and discomfort.  Thoracic lumbar spine good range of motion.  No midline spinal tenderness.  No SI joint tenderness.  Shoulder joints, elbow joints, wrist joints, MCPs, PIPs, DIPs good range of motion with no synovitis.  She has PIP and DIP synovial thickening consistent with osteoarthritis of bilateral hands.  She has complete fist formation bilaterally.  Left CMC joint tenderness and thickening noted.  Hip joints and knee joints good range of motion with no discomfort.  No warmth or effusion of bilateral knee joints. She has right ankle tenderness and swelling. Tenderness of right 5th metatarsal.   CDAI Exam: CDAI Score: Not documented Patient Global Assessment: Not documented;  Provider Global Assessment: Not documented Swollen: Not documented; Tender: Not documented Joint Exam   Not documented   There is currently no information documented on the homunculus. Go to the Rheumatology activity and complete the homunculus joint exam.  Investigation: No additional findings.  Imaging: No results found.  Recent Labs: Lab Results  Component Value Date   WBC 5.1 09/24/2017   HGB 13.2 09/24/2017   PLT 228 09/24/2017   NA 140 09/24/2017   K 4.0 09/24/2017   CL 104 09/24/2017   CO2 26 09/24/2017   GLUCOSE 87 09/24/2017   BUN 15 09/24/2017   CREATININE 0.74  09/24/2017   BILITOT 0.5 09/24/2017   ALKPHOS 97 07/30/2016   AST 29 09/24/2017   ALT 24 09/24/2017   PROT 7.5 09/24/2017   ALBUMIN 3.6 07/30/2016   CALCIUM 9.5 09/24/2017   GFRAA 104 09/24/2017    Speciality Comments: No specialty comments available.  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  She has warmth and swelling of the right ankle and tenderness along the right 5th metatarsal. A x-ray of the right ankle was obtained at her last visit, which was unremarkable.  A right foot MRI was ordered today.  She continues to take PLQ 200 mg 1 tablet in the morning and 1/2 tablet in the evening.  She has not had any recent rashes, sun sensitivity, or hair loss. No malar rash or alopecia noted.  No oral or nasal ulcerations noted.  No parotid swelling although she continues to have mouth dryness and uses biotene products.  She continues to have symptoms of Raynaud's.  No digital ulcerations were noted.  She reports increased difficulty breathing at times, and she was advised to follow up with PCP urgently.  She has not had a recent cough or fever. She has not had any recent palpitations.  She will continue on PLQ 200 mg 1 tablet po morning and 1/2 tablet in the evening. Sed rate, complements, and dsDNA were WNL on 09/24/17.  She was advised to notify us if she develops increased joint pain or joint swelling.   High risk medication use - PLQ 200 mg 1 tablet by mouth in the morning and 1/2 table po in the evening.  CBC and CMP are within normal limits on 09/24/2017.  She has her Plaquenil eye exam scheduled for 03/04/2018.  Pain in right ankle and joints of right foot: She has been  having pain and swelling in her right ankle and right foot for the past 2 months.  She has tenderness along the right ankle joint line as well as the fifth metatarsal.  Swelling was noted on exam today.  An x-ray of the right knee ankle was obtained at her last visit on 09/24/2017 that was unremarkable.  We will obtain an MRI of the right foot.  Sjogren's syndrome with keratoconjunctivitis sicca (HCC): She continues to have sicca symptoms.  No parotid swelling was noted on exam.  She uses Biotene products as well as eyedrops on a regular basis.  She takes Plaquenil 200 mg 1 tablet in the morning and half tablet in the evening.  Cervical spondylosis without myelopathy: She is having decreased discomfort in her neck.  She has  no symptoms of radiculopathy at this time.  Primary osteoarthritis of both hands: She has PIP and DIP synovial thickening consistent with osteoarthritis of bilateral hands.  She has left CMC joint tenderness and synovial thickening.  She wears a left CMC joint brace on a regular basis.  Joint protection and muscle strengthening were discussed.  Primary osteoarthritis of both knees: No warmth or effusion noted.  She is good range of motion.  She continues to have discomfort in both knee joints.  She states that her knee joints have been giving out on her more frequently which has been leading to increased falls.  We discussed the importance of muscle strengthening.  Chondrocalcinosis  Chondromalacia patellae, right knee  Fibromyalgia: Her fibromyalgia has been flaring over the past several weeks.  She has generalized muscle aches and muscle tenderness due to fiber myalgia.  She is generalized hyperalgesia on exam today.  She has increased muscle tension in bilateral trapezius muscles.  She continues to have chronic fatigue and insomnia.  Other fatigue: Chronic and related to insomnia.    Other medical conditions are listed as follows:   Prolonged QT interval  History of  anxiety  History of diabetes insipidus  History of migraine  History of hypothyroidism  Primary osteoarthritis, unspecified ankle and foot - Plan: MR Foot Right w/o contrast   Orders: Orders Placed This Encounter  Procedures  . MR Foot Right w/o contrast   No orders of the defined types were placed in this encounter.   Face-to-face time spent with patient was 30 minutes. Greater than 50% of time was spent in counseling and coordination of care.  Follow-Up Instructions: Return in about 5 months (around 04/27/2018) for Autoimmune Disease, Sjogren's syndrome, Osteoarthritis.   Gearldine Bienenstock, PA-C   I examined and evaluated the patient with Sherron Ales PA.  Patient continues to have pain and swelling in her right ankle.  On my examination she had puffiness and tenderness over right ankle.  We had detailed discussion regarding obtaining MRI of the right ankle.  She was in agreement.  We will contact her after lab results are available.  The plan of care was discussed as noted above.  Pollyann Savoy, MD  Note - This record has been created using Animal nutritionist.  Chart creation errors have been sought, but may not always  have been located. Such creation errors do not reflect on  the standard of medical care.

## 2017-11-25 NOTE — Telephone Encounter (Signed)
Patient called stating her right foot / ankle has gotten worse since her appointment on 09/24/17.  Patient states she has pain on the inside by her arch and swelling around her ankle and by the afternoons it is too painful to walk on.  Patient is requesting a return call at her work #[phone] before 3:00 pm to let her know if she needs to schedule with Dr. Corliss Skainseveshwar or if she needs to be seen by an Orthopedic Surgeon.

## 2017-12-01 ENCOUNTER — Telehealth: Payer: Self-pay | Admitting: Rheumatology

## 2017-12-01 NOTE — Telephone Encounter (Signed)
Ultrasound will not give the same information as the MRI.  She may want to see a foot specialist if she does not want to get MRI.

## 2017-12-01 NOTE — Telephone Encounter (Signed)
Patient called stating she called her insurance company and because she has "not met her out of pocket the MRI will be very expensive."  Patient states she talked to Dr. Hulan Saas a friend of hers and he told her he could get an ultrasound which would be less expensive. Patient states her right foot is still swollen and is checking to see if an ultrasound would give Dr. Estanislado Pandy the information she needs.  Patient requested a return call.

## 2017-12-01 NOTE — Telephone Encounter (Signed)
Patient advised ultrasound will not give the same information as the MRI.  She may want to see a foot specialist if she does not want to get MRI. Patient verbalized understanding.

## 2017-12-07 ENCOUNTER — Other Ambulatory Visit: Payer: Self-pay | Admitting: Rheumatology

## 2017-12-07 NOTE — Telephone Encounter (Signed)
Last Visit: 11/25/17 Next Visit: 03/15/18 Labs: 09/24/17 CBC and CMP WNL. Sed rate WNL. Complements WNL.  Okay to refill per Dr. Corliss Skains

## 2017-12-21 ENCOUNTER — Ambulatory Visit
Admission: RE | Admit: 2017-12-21 | Discharge: 2017-12-21 | Disposition: A | Discharge status: Home / Self Care | Payer: 59 | Source: Ambulatory Visit | Attending: Cardiology | Admitting: Cardiology

## 2017-12-21 ENCOUNTER — Other Ambulatory Visit: Payer: Self-pay | Admitting: Cardiology

## 2017-12-21 DIAGNOSIS — R0602 Shortness of breath: Secondary | ICD-10-CM

## 2017-12-22 ENCOUNTER — Ambulatory Visit (HOSPITAL_COMMUNITY)
Admission: RE | Admit: 2017-12-22 | Discharge: 2017-12-22 | Disposition: A | Discharge status: Home / Self Care | Payer: 59 | Source: Ambulatory Visit | Attending: Rheumatology | Admitting: Rheumatology

## 2017-12-22 DIAGNOSIS — M19079 Primary osteoarthritis, unspecified ankle and foot: Secondary | ICD-10-CM | POA: Diagnosis present

## 2017-12-24 ENCOUNTER — Telehealth: Payer: Self-pay | Admitting: Rheumatology

## 2017-12-24 NOTE — Telephone Encounter (Signed)
Patient left a voicemail stating she was returning a call regarding her MRI results.  Patient states she can be reached at her work [phone]

## 2017-12-24 NOTE — Telephone Encounter (Signed)
Patient advised MRI of right ankle revealed insertional achilles tendinosis with retrocalcaneal bursitis. PTT tendinosis noted.  No tears or fractures noted.  Patient states she continues to having swelling in right ankle. Per Sherron Ales, PA-C recommended voltaren gel topically 3 times daily as well as ice. Also recommend using a right heel cushion. Patient advised if she continues to be symptomatic despite conservative measures please advise patient to notify us. Patient advised if her pain and swelling persists a walking boot may be necessary.  Physical therapy could be an option as well. Patient verbalized understanding.

## 2018-02-07 ENCOUNTER — Other Ambulatory Visit: Payer: Self-pay | Admitting: Rheumatology

## 2018-02-07 NOTE — Telephone Encounter (Signed)
Last Visit: 11/25/17 Next Visit: 03/15/18 Labs: 09/24/17 CBC and CMP WNL. Sed rate WNL. Complements WNL. PLQ dailyeye exam 12/2016 ( due to update 03/04/18)  Okay to refill per Dr. Corliss Skainseveshwar

## 2018-02-23 ENCOUNTER — Other Ambulatory Visit: Payer: Self-pay | Admitting: Internal Medicine

## 2018-02-23 DIAGNOSIS — Z1231 Encounter for screening mammogram for malignant neoplasm of breast: Secondary | ICD-10-CM

## 2018-03-03 NOTE — Progress Notes (Signed)
Office Visit Note  Patient: Stefanie [name]             Date of Birth: [birth year]           MRN: [id number]             PCP: Creola Corn, MD Referring: Creola Corn, MD Visit Date: 03/15/2018 Occupation: @GUAROCC @  Subjective:  Pain in multiple joints    History of Present Illness: Stefanie [name] is a 58 y.o. female with history of autoimmune disease, Sjogren's, osteoarthritis, and fibromyalgia. She is taking PLQ 200 mg 1 tablet by mouth in the morning and 1/2 tablet in the evening. She is taking colchicine 0.6 mg 1 tablet by mouth daily.   She takes Advil PRN for pain relief. She has been having pain in multiple joints including bilateral knee joints and bilateral knee joints.  She has noticed swelling in the right knee and both ankle joints.  She states she continues to have pain the left CMC joint.  She continues to have intermittent facial flushing.  She has not had any sores in her mouth or nose.  She continues to have severe sicca symptoms.  She has intermittent symptoms of Raynaud's.  She denies any worsening hair loss recently.  She denies any worsening photosensitivity.  She states she has noticed worsening fatigue but denies any low grade fevers.  She is following up with Dr. Jacinto Halim tomorrow for chest pain that she has been experiencing.   Activities of Daily Living:  Patient reports morning stiffness for 1 hour.   Patient Reports nocturnal pain.  Difficulty dressing/grooming: Reports Difficulty climbing stairs: Reports Difficulty getting out of chair: Reports Difficulty using hands for taps, buttons, cutlery, and/or writing: Reports  Review of Systems  Constitutional: Positive for fatigue.  HENT: Positive for mouth dryness. Negative for mouth sores, trouble swallowing, trouble swallowing and nose dryness.   Eyes: Positive for dryness. Negative for pain, redness, itching and visual disturbance.  Respiratory: Negative for cough, hemoptysis, shortness of breath, wheezing and  difficulty breathing.   Cardiovascular: Positive for swelling in legs/feet. Negative for palpitations and hypertension.  Gastrointestinal: Negative for abdominal pain, blood in stool, constipation and diarrhea.  Endocrine: Negative for increased urination.  Genitourinary: Negative for painful urination, nocturia and pelvic pain.  Musculoskeletal: Positive for arthralgias, joint pain, joint swelling and morning stiffness. Negative for myalgias, muscle weakness, muscle tenderness and myalgias.  Skin: Negative for color change, pallor, rash, hair loss, nodules/bumps, skin tightness, ulcers and sensitivity to sunlight.  Allergic/Immunologic: Negative for susceptible to infections.  Neurological: Negative for dizziness, light-headedness, numbness and memory loss.  Hematological: Negative for swollen glands.  Psychiatric/Behavioral: Negative for depressed mood, confusion and sleep disturbance. The patient is not nervous/anxious.     PMFS History:  Patient Active Problem List   Diagnosis Date Noted  . Bursitis, ischial, right 10/08/2016  . 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 Fibromyalgia 10/08/2016  . Other fatigue 10/08/2016  . DDD (degenerative disc disease), lumbar/ and scoliosis 10/08/2016  . Facial droop   . 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 Prolonged QT interval 07/28/2016  . Primary osteoarthritis of both hands 05/11/2016  . Primary osteoarthritis of both knees 05/11/2016  . History of hypothyroidism 05/11/2016  . History of migraine 05/11/2016  . 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 ANA positive 04/29/2016  . Rheumatoid factor positive 04/29/2016  . Pseudogout 04/29/2016  . Chondrocalcinosis 04/29/2016  .  High risk medication use 04/29/2016  . Sjogren's syndrome with keratoconjunctivitis sicca (HCC) 04/29/2016  . History of neutropenia 04/29/2016  .  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  . Hyponatremia 12/12/2013  . Fever 12/13/2012  . Pleuritic chest pain 12/06/2012  . 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 Anxiety 12/06/2012  . ANEMIA-IRON DEFICIENCY 01/08/2010  . GASTRIC DILATION, ACUTE 01/08/2010  . NAUSEA ALONE 01/08/2010  . ABDOMINAL PAIN, LEFT UPPER QUADRANT 01/08/2010    Past Medical History:  Diagnosis Date  . Anemia   . Arthritis   . Asthma   . Diabetes insipidus (HCC)   . Fibromyalgia   . Lupus (HCC)   . Mitral valve prolapse   . SLE (systemic lupus erythematosus) (HCC) 12/06/2012  . Thyroid disease     Family History  Problem Relation Age of Onset  . Diabetes Father   . Heart disease Father   . Hyperlipidemia Father   . Hypertension Father   . Stroke Father   . Diabetes Sister   . Cancer Maternal Grandmother   . Diabetes Paternal Grandfather   . Heart disease Paternal Grandfather   . Hyperlipidemia Paternal Grandfather   . Hypertension Paternal Grandfather    Past Surgical History:  Procedure Laterality Date  . PITUITARY EXCISION     Social History   Social History Narrative  . Not on file    Objective: Vital Signs: BP 119/81 (BP Location: Left Arm, Patient Position: Sitting, Cuff Size: Normal)   Pulse 78   Resp 13   Ht 5\' 6"  (1.676 m)   Wt 151 lb 12.8 oz (68.9 kg)   LMP 04/09/2014 (Approximate)   BMI 24.50 kg/m    Physical Exam Vitals signs and nursing note reviewed.  Constitutional:      Appearance: She is well-developed.  HENT:     Head: Normocephalic and atraumatic.  Eyes:     Conjunctiva/sclera: Conjunctivae normal.  Neck:     Musculoskeletal: Normal range of motion.  Cardiovascular:     Rate and Rhythm: Normal rate and regular rhythm.     Heart sounds: Normal heart sounds.  Pulmonary:     Effort: Pulmonary effort is normal.     Breath sounds: Normal breath sounds.  Abdominal:     General: Bowel sounds are normal.     Palpations: Abdomen is soft.    Lymphadenopathy:     Cervical: No cervical adenopathy.  Skin:    General: Skin is warm and dry.     Capillary Refill: Capillary refill takes less than 2 seconds.  Neurological:     Mental Status: She is alert and oriented to person, place, and time.  Psychiatric:        Behavior: Behavior normal.      Musculoskeletal Exam: C-spine, thoracic spine, and lumbar spine good ROM.  No midline spinal tenderness.  No SI joint tenderness.  Shoulder joints, elbow joints, wrist joints, MCPs, PIPs, and IDPs good ROM with no synovitis.  Hip joints good ROM with no discomfort.  Right knee small effusion noted.  Left knee good ROM with no warmth or effusion.  No tenderness or swelling of ankle joints.   Mild pedal edema bilaterally.   CDAI Exam: CDAI Score: Not documented Patient Global Assessment: Not documented; Provider Global Assessment: Not documented Swollen: Not documented; Tender: Not documented Joint Exam   Not documented   There is currently no information documented on the homunculus. Go to the Rheumatology activity and complete the homunculus joint exam.  Investigation: No  additional findings.  Imaging: No results found.  Recent Labs: Lab Results  Component Value Date   WBC 5.1 09/24/2017   HGB 13.2 09/24/2017   PLT 228 09/24/2017   NA 140 09/24/2017   K 4.0 09/24/2017   CL 104 09/24/2017   CO2 26 09/24/2017   GLUCOSE 87 09/24/2017   BUN 15 09/24/2017   CREATININE 0.74 09/24/2017   BILITOT 0.5 09/24/2017   ALKPHOS 97 07/30/2016   AST 29 09/24/2017   ALT 24 09/24/2017   PROT 7.5 09/24/2017   ALBUMIN 3.6 07/30/2016   CALCIUM 9.5 09/24/2017   GFRAA 104 09/24/2017    Speciality Comments: PLQ Eye [facility]  Procedures:  Large Joint Inj: R knee on 03/15/2018 3:30 PM Indications: pain Details: 27 G 1.5 in needle, medial approach  Arthrogram: No  Medications: 1.5 mL lidocaine 1 %; 40 mg triamcinolone acetonide  40 MG/ML Aspirate: 0 mL Outcome: tolerated well, no immediate complications Procedure, treatment alternatives, risks and benefits explained, specific risks discussed. Consent was given by the patient. Immediately prior to procedure a time out was called to verify the correct patient, procedure, equipment, support staff and site/side marked as required. Patient was prepped and draped in the usual sterile fashion.     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  She continues to take PLQ 200 mg 1 tablet in the morning and 1/2 tablet in the evening.  She has not missed any doses recently and has been tolerating it well.  She has warmth and small effusion of the right knee joint on exam, which is likely due to a pseudogout flare.  She takes colchicine 0.6 mg 1 tablet by mouth daily.  She was advised to take colchicine 0.6 2 tablet daily during flares.  A right knee joint cortisone injection was performed today.  She has no malar rash at this time.  She has no oral or nasal ulcerations.  She has intermittent symptoms of raynaud's but digital ulcerations or signs of gangrene noted.  She continues to have sicca symptoms.  She has no parotid swelling on exam.  She has not had any hair loss or photosensitivity recently. She will continue on PLQ 200 mg 1 tablet in the morning and 1/2 tab in the evening.  Refill was sent to pharmacy. We will check autoimmune labs today. She was advised to notify us if she develops any new or worsening symptoms.  She will follow up in the office in 5 months. Plan: Urinalysis, Routine w reflex microscopic, Anti-DNA antibody, double-stranded, C3 and C4, Sedimentation rate, hydroxychloroquine (PLAQUENIL) 200 MG tablet  High risk  medication use - PLQ 1 tablet in morning and 1/2 tablet in the evening. eye exam: 02/16/2018. CBC and CMP were drawn today to monitor for drug toxicity.  We will check autoimmune labs today.- Plan: CBC with Differential/Platelet, COMPLETE METABOLIC PANEL WITH GFR, CBC with Differential/Platelet, COMPLETE METABOLIC PANEL WITH GFR  Sjogren's syndrome with keratoconjunctivitis sicca (HCC) - She continues to have severe sicca symptoms.  She has no parotid swelling on exam.  She will continue on PLQ 1 tablet in the morning and 1/2 tablet in the evening.  We will check SPEP, RF, UA, CBC, and CMP today.  Plan: Serum protein electrophoresis with reflex, Rheumatoid factor  Pain in right ankle and joints of right foot - MRI 12/23/2017 MRI of right ankle revealed insertional achilles tendinosis with retrocalcaneal bursitis.  Cervical spondylosis without myelopathy: She has good ROM on exam.  She has no symptoms of radiculopathy at this time.   Primary osteoarthritis of both hands: She has PIP and DIP synovial thickening consistent with osteoarthritis.  She has left CMC joint synovial thickening and tenderness. She has complete fist formation bilaterally.  She has no synovitis.  Joint protection and muscle strengthening were discussed.   Primary osteoarthritis of both knees: She has a small effusion of the right knee joint on exam.  She has been having increased pain in both knee joints and intermittent joint swelling.  A right knee joint cortisone injection was performed today.  She tolerated the procedure well.  Procedure note completed above.    Chondromalacia patellae, right knee: She has a small effusion of the right knee joint on exam.    Chondrocalcinosis: She takes colchicine 0.6 mg 1 tablet po daily.  During flares, she was advised to take Colchicine 0.6 mg 2 tablets po daily.   Fibromyalgia: She has generalized hyperalgesia and positive tender points on exam.  She is having worsening muscle tenderness  today. She continues to have chronic fatigue.   Other fatigue: Chronic   Other medical conditions are listed as follows:   History of hypothyroidism  History of diabetes insipidus  Prolonged QT interval  History of anxiety  History of migraine   Orders: Orders Placed This Encounter  Procedures  . Large Joint Inj: R knee  . CBC with Differential/Platelet  . COMPLETE METABOLIC PANEL WITH GFR  . CBC with Differential/Platelet  . COMPLETE METABOLIC PANEL WITH GFR  . Urinalysis, Routine w reflex microscopic  . Anti-DNA antibody, double-stranded  . C3 and C4  . Sedimentation rate  . Serum protein electrophoresis with reflex  . Rheumatoid factor   Meds ordered this encounter  Medications  . hydroxychloroquine (PLAQUENIL) 200 MG tablet    Sig: Take 1 tablet (200 mg total) by mouth in the morning and 1/2 tablet (100 mg total) by mouth in the evening.    Dispense:  135 tablet    Refill:  0  . Colchicine 0.6 MG CAPS    Sig: Take 1 capsule by mouth daily.    Dispense:  90 capsule    Refill:  1    Face-to-face time spent with patient was 30 minutes. Greater than 50% of time was spent in counseling and coordination of care.  Follow-Up Instructions: Return in about 5 months (around 08/14/2018) for Autoimmune Disease, Sjogren's syndrome.   Gearldine Bienenstockaylor M Dale, PA-C   I examined and evaluated the patient with Sherron Alesaylor Dale PA.  Patient had some warmth and swelling of her right knee joint on my examination today.  I tried to aspirate her knee but could not get any fluid.  The knee joint was injected with cortisone.  She has known history of pseudogout.  I believe she probably is having pseudogout flare.  We discussed increasing colchicine to twice daily for the flares.  We will also obtain autoimmune labs today to monitor for autoimmune process.  The plan of care was discussed as noted above.  Pollyann SavoyShaili Deveshwar, MD  Note - This record has been created using [employer] software.  Chart creation  errors have been sought, but may not always  have been located. Such creation errors do not  reflect on  the standard of medical care.

## 2018-03-15 ENCOUNTER — Ambulatory Visit: Payer: 59 | Admitting: Rheumatology

## 2018-03-15 ENCOUNTER — Encounter: Payer: Self-pay | Admitting: Rheumatology

## 2018-03-15 VITALS — BP 119/81 | HR 78 | Resp 13 | Ht 66.0 in | Wt 151.8 lb

## 2018-03-15 DIAGNOSIS — M25571 Pain in right ankle and joints of right foot: Secondary | ICD-10-CM

## 2018-03-15 DIAGNOSIS — M25561 Pain in right knee: Secondary | ICD-10-CM

## 2018-03-15 DIAGNOSIS — Z8639 Personal history of other endocrine, nutritional and metabolic disease: Secondary | ICD-10-CM

## 2018-03-15 DIAGNOSIS — M359 Systemic involvement of connective tissue, unspecified: Secondary | ICD-10-CM | POA: Diagnosis not present

## 2018-03-15 DIAGNOSIS — Z8669 Personal history of other diseases of the nervous system and sense organs: Secondary | ICD-10-CM

## 2018-03-15 DIAGNOSIS — M25461 Effusion, right knee: Secondary | ICD-10-CM

## 2018-03-15 DIAGNOSIS — M3501 Sicca syndrome with keratoconjunctivitis: Secondary | ICD-10-CM | POA: Diagnosis not present

## 2018-03-15 DIAGNOSIS — M170 Bilateral primary osteoarthritis of knee: Secondary | ICD-10-CM

## 2018-03-15 DIAGNOSIS — Z79899 Other long term (current) drug therapy: Secondary | ICD-10-CM

## 2018-03-15 DIAGNOSIS — Z8659 Personal history of other mental and behavioral disorders: Secondary | ICD-10-CM

## 2018-03-15 DIAGNOSIS — G8929 Other chronic pain: Secondary | ICD-10-CM

## 2018-03-15 DIAGNOSIS — M47812 Spondylosis without myelopathy or radiculopathy, cervical region: Secondary | ICD-10-CM

## 2018-03-15 DIAGNOSIS — M19042 Primary osteoarthritis, left hand: Secondary | ICD-10-CM

## 2018-03-15 DIAGNOSIS — R5383 Other fatigue: Secondary | ICD-10-CM

## 2018-03-15 DIAGNOSIS — M2241 Chondromalacia patellae, right knee: Secondary | ICD-10-CM

## 2018-03-15 DIAGNOSIS — M19041 Primary osteoarthritis, right hand: Secondary | ICD-10-CM

## 2018-03-15 DIAGNOSIS — R9431 Abnormal electrocardiogram [ECG] [EKG]: Secondary | ICD-10-CM

## 2018-03-15 DIAGNOSIS — M797 Fibromyalgia: Secondary | ICD-10-CM

## 2018-03-15 DIAGNOSIS — M1120 Other chondrocalcinosis, unspecified site: Secondary | ICD-10-CM

## 2018-03-15 MED ORDER — HYDROXYCHLOROQUINE SULFATE 200 MG PO TABS: ORAL_TABLET | ORAL | 0 refills | Status: DC

## 2018-03-15 MED ORDER — TRIAMCINOLONE ACETONIDE 40 MG/ML IJ SUSP
40.0000 mg | INTRAMUSCULAR | Status: AC | PRN
  Administered 2018-03-15: 40 mg via INTRA_ARTICULAR

## 2018-03-15 MED ORDER — COLCHICINE 0.6 MG PO CAPS: 1.0000 | ORAL_CAPSULE | Freq: Every day | ORAL | 1 refills | Status: DC

## 2018-03-15 MED ORDER — LIDOCAINE HCL 1 % IJ SOLN
1.5000 mL | INTRAMUSCULAR | Status: AC | PRN
  Administered 2018-03-15: 1.5 mL

## 2018-03-17 LAB — CBC WITH DIFFERENTIAL/PLATELET
ABSOLUTE MONOCYTES: 466 {cells}/uL (ref 200–950)
Basophils Absolute: 31 cells/uL (ref 0–200)
Basophils Relative: 0.7 %
Eosinophils Absolute: 70 cells/uL (ref 15–500)
Eosinophils Relative: 1.6 %
HCT: 41.7 % (ref 35.0–45.0)
Hemoglobin: 14.2 g/dL (ref 11.7–15.5)
Lymphs Abs: 1588 cells/uL (ref 850–3900)
MCH: 29.6 pg (ref 27.0–33.0)
MCHC: 34.1 g/dL (ref 32.0–36.0)
MCV: 86.9 fL (ref 80.0–100.0)
MPV: 11.1 fL (ref 7.5–12.5)
Monocytes Relative: 10.6 %
Neutro Abs: 2244 cells/uL (ref 1500–7800)
Neutrophils Relative %: 51 %
Platelets: 283 10*3/uL (ref 140–400)
RBC: 4.8 10*6/uL (ref 3.80–5.10)
RDW: 12.4 % (ref 11.0–15.0)
TOTAL LYMPHOCYTE: 36.1 %
WBC: 4.4 10*3/uL (ref 3.8–10.8)

## 2018-03-17 LAB — COMPLETE METABOLIC PANEL WITH GFR
AG RATIO: 1.5 (calc) (ref 1.0–2.5)
ALT: 24 U/L (ref 6–29)
AST: 31 U/L (ref 10–35)
Albumin: 4.7 g/dL (ref 3.6–5.1)
Alkaline phosphatase (APISO): 85 U/L (ref 33–130)
BUN: 13 mg/dL (ref 7–25)
CALCIUM: 9.8 mg/dL (ref 8.6–10.4)
CO2: 29 mmol/L (ref 20–32)
CREATININE: 0.68 mg/dL (ref 0.50–1.05)
Chloride: 101 mmol/L (ref 98–110)
GFR, Est African American: 112 mL/min/{1.73_m2} (ref >=60)
GFR, Est Non African American: 96 mL/min/{1.73_m2} (ref >=60)
Globulin: 3.2 g/dL (calc) (ref 1.9–3.7)
Glucose, Bld: 81 mg/dL (ref 65–99)
Potassium: 4.8 mmol/L (ref 3.5–5.3)
Sodium: 138 mmol/L (ref 135–146)
Total Bilirubin: 0.7 mg/dL (ref 0.2–1.2)
Total Protein: 7.9 g/dL (ref 6.1–8.1)

## 2018-03-17 LAB — URINALYSIS, ROUTINE W REFLEX MICROSCOPIC
BACTERIA UA: NONE SEEN /HPF
Bilirubin Urine: NEGATIVE
Glucose, UA: NEGATIVE
Hgb urine dipstick: NEGATIVE
Hyaline Cast: NONE SEEN /LPF
Ketones, ur: NEGATIVE
Nitrite: NEGATIVE
PROTEIN: NEGATIVE
RBC / HPF: NONE SEEN /HPF (ref 0–2)
Specific Gravity, Urine: 1.01 (ref 1.001–1.03)
Squamous Epithelial / HPF: NONE SEEN /HPF (ref <=5)
pH: 6.5 (ref 5.0–8.0)

## 2018-03-17 LAB — C3 AND C4
C3 Complement: 146 mg/dL (ref 83–193)
C4 Complement: 33 mg/dL (ref 15–57)

## 2018-03-17 LAB — PROTEIN ELECTROPHORESIS, SERUM, WITH REFLEX
ALPHA 1: 0.3 g/dL (ref 0.2–0.3)
Albumin ELP: 4.7 g/dL (ref 3.8–4.8)
Alpha 2: 0.8 g/dL (ref 0.5–0.9)
Beta 2: 0.5 g/dL (ref 0.2–0.5)
Beta Globulin: 0.5 g/dL (ref 0.4–0.6)
Gamma Globulin: 1.5 g/dL (ref 0.8–1.7)
Total Protein: 8.3 g/dL — ABNORMAL HIGH (ref 6.1–8.1)

## 2018-03-17 LAB — SEDIMENTATION RATE: Sed Rate: 41 mm/h — ABNORMAL HIGH (ref 0–30)

## 2018-03-17 LAB — ANTI-DNA ANTIBODY, DOUBLE-STRANDED: ds DNA Ab: 1 IU/mL

## 2018-03-17 LAB — RHEUMATOID FACTOR: Rheumatoid fact SerPl-aCnc: 20 IU/mL — ABNORMAL HIGH (ref <14)

## 2018-03-17 NOTE — Progress Notes (Signed)
CBC and CMP WNL. RF is 20.  SPEP is normal. Sed rate is elevated-41.  She had a right knee effusion at her recent appointment.  DsDNA negative. Complements WNL.  UA revealed +1 leukocytes.  Please ask patient if she is experiencing any symptoms of a UTI.  She should follow up with PCP for for further evaluation.

## 2018-03-30 ENCOUNTER — Ambulatory Visit
Admission: RE | Admit: 2018-03-30 | Discharge: 2018-03-30 | Disposition: A | Discharge status: Home / Self Care | Payer: 59 | Source: Ambulatory Visit | Attending: Internal Medicine | Admitting: Internal Medicine

## 2018-03-30 DIAGNOSIS — Z1231 Encounter for screening mammogram for malignant neoplasm of breast: Secondary | ICD-10-CM

## 2018-04-01 ENCOUNTER — Other Ambulatory Visit (HOSPITAL_COMMUNITY): Payer: Self-pay | Admitting: Respiratory Therapy

## 2018-04-01 DIAGNOSIS — R0602 Shortness of breath: Secondary | ICD-10-CM

## 2018-04-14 ENCOUNTER — Ambulatory Visit (HOSPITAL_COMMUNITY)
Admission: RE | Admit: 2018-04-14 | Discharge: 2018-04-14 | Disposition: A | Discharge status: Home / Self Care | Payer: 59 | Source: Ambulatory Visit | Attending: Cardiology | Admitting: Cardiology

## 2018-04-14 DIAGNOSIS — R0602 Shortness of breath: Secondary | ICD-10-CM | POA: Insufficient documentation

## 2018-04-14 LAB — PULMONARY FUNCTION TEST
DL/VA % pred: 81 %
DL/VA: 3.41 ml/min/mmHg/L
DLCO UNC % PRED: 78 %
DLCO unc: 17.13 ml/min/mmHg
FEF 25-75 PRE: 2.72 L/s
FEF 25-75 Post: 3.15 L/sec
FEF2575-%CHANGE-POST: 15 %
FEF2575-%Pred-Post: 123 %
FEF2575-%Pred-Pre: 106 %
FEV1-%Change-Post: 4 %
FEV1-%Pred-Post: 102 %
FEV1-%Pred-Pre: 98 %
FEV1-Post: 2.88 L
FEV1-Pre: 2.75 L
FEV1FVC-%Change-Post: 0 %
FEV1FVC-%Pred-Pre: 101 %
FEV6-%Change-Post: 3 %
FEV6-%Pred-Post: 102 %
FEV6-%Pred-Pre: 99 %
FEV6-Post: 3.57 L
FEV6-Pre: 3.46 L
FEV6FVC-%Change-Post: 0 %
FEV6FVC-%Pred-Post: 102 %
FEV6FVC-%Pred-Pre: 103 %
FVC-%Change-Post: 3 %
FVC-%Pred-Post: 99 %
FVC-%Pred-Pre: 95 %
FVC-Post: 3.59 L
FVC-Pre: 3.46 L
Post FEV1/FVC ratio: 80 %
Post FEV6/FVC ratio: 99 %
Pre FEV1/FVC ratio: 80 %
Pre FEV6/FVC Ratio: 100 %
RV % pred: 224 %
RV: 4.6 L
TLC % pred: 151 %
TLC: 8.09 L

## 2018-04-14 MED ORDER — ALBUTEROL SULFATE (2.5 MG/3ML) 0.083% IN NEBU
2.5000 mg | INHALATION_SOLUTION | Freq: Once | RESPIRATORY_TRACT | Status: AC
  Administered 2018-04-14: 2.5 mg via RESPIRATORY_TRACT

## 2018-05-10 ENCOUNTER — Ambulatory Visit (INDEPENDENT_AMBULATORY_CARE_PROVIDER_SITE_OTHER): Payer: 59 | Admitting: Neurology

## 2018-05-10 ENCOUNTER — Encounter: Payer: Self-pay | Admitting: Neurology

## 2018-05-10 VITALS — BP 105/73 | HR 81 | Resp 18 | Ht 66.0 in | Wt 149.0 lb

## 2018-05-10 DIAGNOSIS — M3501 Sicca syndrome with keratoconjunctivitis: Secondary | ICD-10-CM

## 2018-05-10 DIAGNOSIS — E871 Hypo-osmolality and hyponatremia: Secondary | ICD-10-CM | POA: Diagnosis not present

## 2018-05-10 DIAGNOSIS — R0689 Other abnormalities of breathing: Secondary | ICD-10-CM

## 2018-05-10 DIAGNOSIS — E063 Autoimmune thyroiditis: Secondary | ICD-10-CM

## 2018-05-10 DIAGNOSIS — M359 Systemic involvement of connective tissue, unspecified: Secondary | ICD-10-CM

## 2018-05-10 DIAGNOSIS — R0781 Pleurodynia: Secondary | ICD-10-CM

## 2018-05-10 DIAGNOSIS — Z8639 Personal history of other endocrine, nutritional and metabolic disease: Secondary | ICD-10-CM

## 2018-05-10 DIAGNOSIS — R768 Other specified abnormal immunological findings in serum: Secondary | ICD-10-CM

## 2018-05-10 DIAGNOSIS — M1120 Other chondrocalcinosis, unspecified site: Secondary | ICD-10-CM

## 2018-05-10 MED ORDER — ALPRAZOLAM 0.5 MG PO TABS: 0.5000 mg | ORAL_TABLET | Freq: Every evening | ORAL | 0 refills | Status: DC | PRN

## 2018-05-10 NOTE — Progress Notes (Signed)
SLEEP MEDICINE CLINIC   Provider:  Melvyn Novas, MD   Primary Care Physician:  Creola Corn, MD   Referring Provider:  Yates Decamp, MD    Chief Complaint  Patient presents with  . Fatigue    Rm. 11. Sts. has chronic fatigue due to Lupus and hypothyroidism.  Goes to bed around 10, occasional difficulty fallinjg asleep. Wakes 1-2 times to go to the bathroom. Goes back to sleep ok. Husband tells her she snores, and she sts. she has snored herself awake occasionally. Her alarm goes off at 4:26am, and she gets up by 5am. Has more fatigue around 1-2pm. Does not nap during the day. 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     HPI:  Stefanie [name] is a 58 y.o. female patient of Dr. Verl Dicker who referred for consultation. The patient is seen here on 05-10-2018. Stefanie [name] is a 58 year old Caucasian right-handed female patient who works in the [employer] center. She has seen Dr. Jacinto Halim who follows her for dyspnea.  She has a history of rheumatoid arthritis with a positive ANA and RF, keratoconjunctivitis sicca, degenerative joint disease and lumbar disc disease.  She had a normal Lexiscan nuclear test with another Cardiologist and a normal echocardiogram there was just a mild mitral valve regurgitation there was no pulmonary hypertension noted in 2018 she was encouraged to start regular exercises preferably aquatic exercises to improve her symptoms as Dr. Jacinto Halim initially felt that were related to deconditioning.  He also referred for a sleep medicine evaluation and for pulmonary function tests.  She has never presented with leg swelling she does not report orthopnea and she has not coughed blood.  Most of her symptoms are reflective of her marked fatigue but also often interrupted sleep pattern at night. SIADH- hyponatremia.     Chief complaint according to patient: "at night I wake up gasping,with a feeling my chest is heavy".    Sleep habits are as follows: Supper  time at 5 to 5 thirty PM. Her husband and the patient stooped walking after dinner. She is mostly doing housekeeping chores.  Bedtime is 10 PM, she is asleep within 25 minutes after a hot shower. Some nights she has leg cramps, but not RLS. She sleeps on her right side, on 2 pillows under the head and one pillow on her chest. She will stay asleep until she has one of 2 bathroom calls- Goes back to sleep after refreshing her eye drops.. Waking up ay 4 . 26 AM for her husbands work. She can stay another 30 minutes in bed. Work starts at 7 AM.   She cannot nap in day time.    Sleep medical history : followed by oncology, cardiology, rheumatology, Gi. Hyponatremia, SIADH. 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 Sjoegren  Syndrome- SIcca- Myalgia, ANA positive. Gout.  She is a little OCD- a perfectionist.    Family sleep history: For that the patient's father and paternal grandfather had heart disease the father also suffered from depression, diabetes, hypertension paternal grandfather had also arthritis possibly rheumatoid.  Mother has hypothyroidism maternal grandmother died of lung cancer and her father died at age 31 of congestive heart failure.  Her mother is alive and at this time 22.  Social history: 2 adult children, daughter is 70 ,married with 2 kids, son is 38 and graduates from [employer] in May. He is living with his parents- non smoking patient,  non drinker.  Coffee 50/50 one cup. No tea, seldomly sodas.  No history of night shift work.    Review of Systems: Out of a complete 14 system review, the patient complains of only the following symptoms, and all other reviewed systems are negative.  Right eye droops . Dry mouth, dry eyes. Choking. Gasping for air .   Epworth score: 0 , Fatigue severity score : 38/63  , depression score n/a    Social History   Socioeconomic History  . Marital status: Married    Spouse  name: Not on file  . Number of children: Not on file  . Years of education: Not on file  . Highest education level: Not on file  Occupational History  . Not on file  Social Needs  . Financial resource strain: Not on file  . Food insecurity:    Worry: Not on file    Inability: Not on file  . Transportation needs:    Medical: Not on file    Non-medical: Not on file  Tobacco Use  . Smoking status: Never Smoker  . Smokeless tobacco: Never Used  Substance and Sexual Activity  . Alcohol use: No  . Drug use: No  . Sexual activity: Not on file  Lifestyle  . Physical activity:    Days per week: Not on file    Minutes per session: Not on file  . Stress: Not on file  Relationships  . Social connections:    Talks on phone: Not on file    Gets together: Not on file    Attends religious service: Not on file    Active member of club or organization: Not on file    Attends meetings of clubs or organizations: Not on file    Relationship status: Not on file  . Intimate partner violence:    Fear of current or ex partner: Not on file    Emotionally abused: Not on file    Physically abused: Not on file    Forced sexual activity: Not on file  Other Topics Concern  . Not on file  Social History Narrative  . Not on file    Family History  Problem Relation Age of Onset  . Diabetes Father   . Heart disease Father   . Hyperlipidemia Father   . Hypertension Father   . Stroke Father   . Diabetes Sister   . Cancer Maternal Grandmother   . Diabetes Paternal Grandfather   . Heart disease Paternal Grandfather   . Hyperlipidemia Paternal Grandfather   . Hypertension Paternal Grandfather     Past Medical History:  Diagnosis Date  . Anemia   . Arthritis with gout   . Asthma   . Diabetes insipidus (HCC)   . myalgia   . Lupus (HCC)   . Mitral valve prolapse   . SLE (systemic lupus erythematosus) (HCC) 12/06/2012  . Thyroid disease     Past Surgical History:  Procedure Laterality  Date  . PITUITARY EXCISION    . TONSILLECTOMY      Current Outpatient Medications  Medication Sig Dispense Refill  . albuterol (PROVENTIL HFA;VENTOLIN HFA) 108 (90 BASE) MCG/ACT inhaler Inhale 1 puff into the lungs every 6 (six) hours as needed for wheezing or shortness of breath.    . Cholecalciferol (VITAMIN D PO) Take 1 capsule by mouth daily.    . Colchicine 0.6 MG CAPS Take 1 capsule by mouth daily. 90 capsule 1  . hydroxychloroquine (PLAQUENIL) 200 MG tablet Take  1 tablet (200 mg total) by mouth in the morning and 1/2 tablet (100 mg total) by mouth in the evening. 135 tablet 0  . levothyroxine (SYNTHROID, LEVOTHROID) 125 MCG tablet Take 125 mcg by mouth at bedtime.     . rizatriptan (MAXALT) 10 MG tablet Take 10 mg by mouth daily as needed for migraine. May repeat in 2 hours if needed    . topiramate (TOPAMAX) 100 MG tablet Take 100 mg by mouth daily as needed. Migraines    . Turmeric 450 MG CAPS Take by mouth.     No current facility-administered medications for this visit.     Allergies as of 05/10/2018 - Review Complete 04/14/2018  Allergen Reaction Noted  . Iron sucrose Rash 04/29/2011  . Morphine  04/29/2011  . Venofer [ferric oxide] Palpitations 12/07/2012  . Azithromycin Other (See Comments) 01/08/2010  . Other  04/29/2011    Vitals: BP 105/73   Pulse 81   Resp 18   Ht 5\' 6"  (1.676 m)   Wt 149 lb (67.6 kg)   LMP 04/09/2014 (Approximate)   BMI 24.05 kg/m  Last Weight:  Wt Readings from Last 1 Encounters:  05/10/18 149 lb (67.6 kg)   PQZ:RAQT mass index is 24.05 kg/m.     Last Height:   Ht Readings from Last 1 Encounters:  05/10/18 5\' 6"  (1.676 m)    Physical exam:  General: The patient is awake, alert and appears not in acute distress. The patient is well groomed. Head: Normocephalic, atraumatic. Neck is supple. Mallampati: 1- wide open . Edentulous.  neck circumference:14". Nasal airflow patent ,   Cardiovascular:  Regular rate and rhythm, with trivial  regurgitation murmur , without  carotid bruit, and without distended neck veins. Respiratory: Lungs are clear to auscultation. Skin:  Without evidence of edema, or rash Trunk: BMI is not calculated. The patient's posture is erect  Neurologic exam : The patient is awake and alert, oriented to place and time.  Memory subjective  described as intact.  Attention span & concentration ability appears normal.  Speech is fluent,  without dysarthria, dysphonia or aphasia.  Mood and affect are appropriate.  Cranial nerves: Pupils are equal and briskly reactive to light. Funduscopic exam without  evidence of pallor or edema. Dry eyes. Extraocular movements  in vertical and horizontal planes intact and without nystagmus. Visual fields by finger perimetry are intact. Hearing to finger rub intact. Facial sensation intact to fine touch. Facial motor strength is symmetric and tongue and uvula move midline. Shoulder shrug was symmetrical.   Motor exam:   Normal tone, muscle bulk and symmetric strength in all extremities. She reports feeling much weaker than a year ago.   Sensory:  Fine touch, pinprick and vibration were tested in all extremities. Proprioception tested in the upper extremities was normal.  Coordination: Rapid alternating movements in the fingers/hands was normal.  Finger-to-nose maneuver normal without evidence of ataxia, dysmetria or tremor.  Gait and station: Patient walks without assistive device and is able unassisted to stand up and climb up to the exam table. Strength within normal limits. Stance is stable and normal.  Toe gait is unfragmented. Turns with  3  Steps.  Deep tendon reflexes: in the  upper and lower extremities are symmetric and intact. Babinski maneuver response is   downgoing.    Assessment:  After physical and neurologic examination, review of laboratory studies,  Personal review of imaging studies, reports of other /same  Imaging studies, results of polysomnography  and / or neurophysiology testing and pre-existing records as far as provided in visit., my assessment is :   1) Mr. Rada reports marked fatigue without excessive daytime sleepiness.  It is almost a paradoxical form of fatigue that does not allow her to go to sleep and daytime.  At nighttime however she is able to initiate sleep and except for 2 periods of nocturia each night she is usually able to get just about 6 hours of nocturnal sleep.  I would like for her to advance her bedtime also in preparation of the upcoming daylight savings time by 20 minutes for the next 4 days.  This will make the transfer to daylight savings time easier and she would not end up with an hour of sleep deficit.  I would also like for her to try to in general get half an hour more of sleep by going to bed half an hour earlier.  Her bedroom is described as cool, quiet and dark and conducive to sleep and she is not in need of any specific sleep inducing medications.  Her husband has noted her to snore sometimes very mildly and she has woken up gasping for air in combination with a chest discomfort and feeling of heaviness on the chest.  It is for that reason but I will evaluate her for central sleep apnea as she has very little risk factors in terms of obstructive sleep apnea.    I will order an attended sleep test but with apnea coverage she is more likely to only be allowed to have a home sleep test.  If so we will use a watch pat to screen for central versus obstructive sleep apnea.  2)  3)   The patient was advised of the nature of the diagnosed disorder , the treatment options and the  risks for general health and wellness arising from not treating the condition.   I spent more than 45 minutes of face to face time with the patient.  Greater than 50% of time was spent in counseling and coordination of care. We have discussed the diagnosis and differential and I answered the patient's questions.    Plan:  Treatment  plan and additional workup :  Evaluation for central sleep apnea in a patient with autoimmune disease. The most reliable test is an attended sleep study    Melvyn Novas, MD 05/10/2018, 2:56 PM  Certified in Neurology by [employer] Certified in Sleep Medicine by [facility] [address], [address] [place], [place] [address]

## 2018-05-10 NOTE — Addendum Note (Signed)
Addended by: Melvyn Novas on: 05/10/2018 03:44 PM   Modules accepted: Orders

## 2018-05-10 NOTE — Patient Instructions (Signed)

## 2018-05-24 ENCOUNTER — Other Ambulatory Visit: Payer: Self-pay | Admitting: Physician Assistant

## 2018-05-24 DIAGNOSIS — M359 Systemic involvement of connective tissue, unspecified: Secondary | ICD-10-CM

## 2018-05-25 ENCOUNTER — Telehealth: Payer: Self-pay

## 2018-05-25 ENCOUNTER — Other Ambulatory Visit: Payer: Self-pay | Admitting: Neurology

## 2018-05-25 DIAGNOSIS — R0689 Other abnormalities of breathing: Secondary | ICD-10-CM

## 2018-05-25 DIAGNOSIS — E063 Autoimmune thyroiditis: Secondary | ICD-10-CM

## 2018-05-25 DIAGNOSIS — E871 Hypo-osmolality and hyponatremia: Secondary | ICD-10-CM

## 2018-05-25 DIAGNOSIS — M359 Systemic involvement of connective tissue, unspecified: Secondary | ICD-10-CM

## 2018-05-25 DIAGNOSIS — M3501 Sicca syndrome with keratoconjunctivitis: Secondary | ICD-10-CM

## 2018-05-25 NOTE — Telephone Encounter (Signed)
Last Visit: 03/15/18 Next Visit: 08/23/18 Labs: 03/15/18 CBC and CMP WNL. PLQ Eye Exam 02/16/18 WNL  Okay to refill per Dr. Corliss Skains

## 2018-05-25 NOTE — Telephone Encounter (Signed)
Order placed for HST 

## 2018-05-25 NOTE — Telephone Encounter (Signed)
Insurance has denied in lab sleep study request. Can we get a HST order? Insurance has authorized for that. Thanks

## 2018-05-31 ENCOUNTER — Ambulatory Visit: Payer: Self-pay | Admitting: Cardiology

## 2018-06-17 ENCOUNTER — Telehealth: Payer: Self-pay

## 2018-06-17 NOTE — Telephone Encounter (Signed)
Left message for patient to call back. Patient is over due for 1 year f/u. Wanted to see if patient would like to have a virtual visit next Tuesday.

## 2018-06-20 ENCOUNTER — Encounter: Payer: 59 | Admitting: Neurology

## 2018-06-20 DIAGNOSIS — M359 Systemic involvement of connective tissue, unspecified: Secondary | ICD-10-CM

## 2018-06-20 DIAGNOSIS — M3501 Sicca syndrome with keratoconjunctivitis: Secondary | ICD-10-CM

## 2018-06-20 DIAGNOSIS — R0689 Other abnormalities of breathing: Principal | ICD-10-CM

## 2018-06-20 DIAGNOSIS — E063 Autoimmune thyroiditis: Secondary | ICD-10-CM

## 2018-06-20 DIAGNOSIS — E871 Hypo-osmolality and hyponatremia: Secondary | ICD-10-CM

## 2018-06-21 ENCOUNTER — Other Ambulatory Visit: Payer: Self-pay

## 2018-06-21 NOTE — Telephone Encounter (Signed)
Left second message for patient to call back. Will await patient to call back. 

## 2018-07-04 ENCOUNTER — Ambulatory Visit (INDEPENDENT_AMBULATORY_CARE_PROVIDER_SITE_OTHER): Payer: 59 | Admitting: Cardiology

## 2018-07-04 ENCOUNTER — Encounter: Payer: Self-pay | Admitting: Cardiology

## 2018-07-04 ENCOUNTER — Other Ambulatory Visit: Payer: Self-pay

## 2018-07-04 VITALS — BP 102/64 | HR 77 | Ht 66.0 in | Wt 150.0 lb

## 2018-07-04 DIAGNOSIS — R002 Palpitations: Secondary | ICD-10-CM

## 2018-07-04 DIAGNOSIS — R5383 Other fatigue: Secondary | ICD-10-CM

## 2018-07-04 DIAGNOSIS — M3501 Sicca syndrome with keratoconjunctivitis: Secondary | ICD-10-CM

## 2018-07-04 DIAGNOSIS — R5381 Other malaise: Secondary | ICD-10-CM | POA: Diagnosis not present

## 2018-07-04 DIAGNOSIS — R0789 Other chest pain: Secondary | ICD-10-CM | POA: Diagnosis not present

## 2018-07-04 DIAGNOSIS — R0609 Other forms of dyspnea: Secondary | ICD-10-CM | POA: Diagnosis not present

## 2018-07-04 DIAGNOSIS — I340 Nonrheumatic mitral (valve) insufficiency: Secondary | ICD-10-CM

## 2018-07-04 NOTE — Progress Notes (Signed)
Subjective:   Stefanie [name], female    DOB: 1960/07/20, 58 y.o.   MRN: [id number]  Shon Baton, MD:  Chief Complaint  Patient presents with  . Shortness of Breath  . Fatigue   This visit type was conducted due to national recommendations for restrictions regarding the COVID-19 Pandemic (e.g. social distancing).  This format is felt to be most appropriate for this patient at this time.  All issues noted in this document were discussed and addressed.  No physical exam was performed (except for noted visual exam findings with Telehealth visits).  The patient has consented to conduct a Telehealth visit and understands insurance will be billed.   I discussed the limitations of evaluation and management by telemedicine and the availability of in person appointments. The patient expressed understanding and agreed to proceed.  Virtual Visit via Video Note is as below  I connected with Mrs. Mishkin, on 07/04/2018 at 1530 by a video enabled telemedicine application and verified that I am speaking with the correct person using two identifiers.     I have discussed with her regarding the safety during COVID Pandemic and steps and precautions including social distancing with the patient.    HPI: Stefanie [name]  is a 58 y.o. female  with long-standing history of fibromyalgia, rheumatoid arthritis with positive ANA and rheumatoid factor, Sjogren's syndrome with keratoconjunctivitis sicca, degenerative joint disease and lumbar disc disease.  Patient has been evaluated for atypical chest pain and also dyspnea and has had normal Lexiscan nuclear stress test and echocardiogram except for mild to moderate MR without pulmonary hypertension in 2018.  As her symptoms were concerning for possible underlying central sleep apnea, we had referred to sleep medicine for evaluation of possible central sleep apnea. She was evaluated by Dr. Brett Fairy, who is performing sleep study for further  evaluation. She did have PFT's also performed that showed a reduced diffusing capacity, minimal airway obstruction and overinflation.  She continues to have significant fatigue, decreased exercise tolerance, and associated dyspnea. No leg edema, no PND or orthopnea, no hemoptysis. She does continue to have occasional chest pain not associated with exertion. She also reports two epsiodes of palpitations over the last month that felt like irregular heart beat. Her last episode was a few days ago, but has not had any reoccurrence. Palpitations occurred while just sitting watching TV and lasted for a few hours. Felt extremely tired after this.   She is followed by Dr. Estanislado Pandy for management of autoimmune diseases.     Past Medical History:  Diagnosis Date  . Anemia   . Arthritis   . Asthma   . Diabetes insipidus ([place])   . Fibromyalgia   . Lupus ([place])   . Mitral valve prolapse   . SLE (systemic lupus erythematosus) ([place]) 12/06/2012  . Thyroid disease     Past Surgical History:  Procedure Laterality Date  . PITUITARY EXCISION    . TONSILLECTOMY      Family History  Problem Relation Age of Onset  . Diabetes Father   . Heart disease Father   . Hyperlipidemia Father   . Hypertension Father   . Stroke Father   . Diabetes Sister   . Cancer Maternal Grandmother   . Diabetes Paternal Grandfather   . Heart disease Paternal Grandfather   . Hyperlipidemia Paternal Grandfather   . Hypertension Paternal Grandfather     Social History   Socioeconomic History  . Marital status: Married  Spouse name: Not on file  . Number of children: 2  . Years of education: Not on file  . Highest education level: Not on file  Occupational History  . Not on file  Social Needs  . Financial resource strain: Not on file  . Food insecurity:    Worry: Not on file    Inability: Not on file  . Transportation needs:    Medical: Not on file    Non-medical: Not on file  Tobacco Use  . Smoking  status: Never Smoker  . Smokeless tobacco: Never Used  Substance and Sexual Activity  . Alcohol use: No  . Drug use: No  . Sexual activity: Not on file  Lifestyle  . Physical activity:    Days per week: Not on file    Minutes per session: Not on file  . Stress: Not on file  Relationships  . Social connections:    Talks on phone: Not on file    Gets together: Not on file    Attends religious service: Not on file    Active member of club or organization: Not on file    Attends meetings of clubs or organizations: Not on file    Relationship status: Not on file  . Intimate partner violence:    Fear of current or ex partner: Not on file    Emotionally abused: Not on file    Physically abused: Not on file    Forced sexual activity: Not on file  Other Topics Concern  . Not on file  Social History Narrative  . Not on file    Current Meds  Medication Sig  . albuterol (PROVENTIL HFA;VENTOLIN HFA) 108 (90 BASE) MCG/ACT inhaler Inhale 1 puff into the lungs every 6 (six) hours as needed for wheezing or shortness of breath.  . ALPRAZolam (XANAX) 0.5 MG tablet Take 1 tablet (0.5 mg total) by mouth at bedtime as needed for anxiety.  . Cholecalciferol (VITAMIN D PO) Take 1 capsule by mouth daily.  . Colchicine 0.6 MG CAPS Take 1 capsule by mouth daily.  . hydroxychloroquine (PLAQUENIL) 200 MG tablet TAKE 1 TABLET (200 MG) BY MOUTH IN THE MORNING AND 1/2 TABLET (100 MG) BY MOUTH IN THE EVENING.  [name] levothyroxine (SYNTHROID, LEVOTHROID) 125 MCG tablet Take 125 mcg by mouth at bedtime.   . rizatriptan (MAXALT) 10 MG tablet Take 10 mg by mouth daily as needed for migraine. May repeat in 2 hours if needed  . Turmeric 450 MG CAPS Take by mouth.     Review of Systems  Constitution: Positive for malaise/fatigue (chronic and improved the last few days). Negative for decreased appetite, weight gain and weight loss.  Eyes: Negative for visual disturbance.  Cardiovascular: Positive for chest pain,  dyspnea on exertion and palpitations (lasting 3-4 hours; during rest). Negative for claudication, leg swelling, orthopnea and syncope.  Respiratory: Negative for hemoptysis and wheezing.   Endocrine: Negative for cold intolerance and heat intolerance.  Hematologic/Lymphatic: Does not bruise/bleed easily.  Skin: Negative for nail changes.  Musculoskeletal: Positive for joint pain. Negative for muscle weakness and myalgias.  Gastrointestinal: Negative for abdominal pain, change in bowel habit, nausea and vomiting.  Neurological: Negative for difficulty with concentration, dizziness, focal weakness and headaches.  Psychiatric/Behavioral: Negative for altered mental status and suicidal ideas.  All other systems reviewed and are negative.      Objective:     Blood pressure 102/64, pulse 77, height '5\' 6"'  (1.676 m), weight 150 lb (68 kg),  last menstrual period 04/09/2014.  Cardiac studies:  Outside Echocardiogram May 20, 2016: - Left ventricle: The cavity size was normal. Systolic function was   normal. The estimated ejection fraction was in the range of 55%   to 60%. Wall motion was normal; there were no regional wall   motion abnormalities. Left ventricular diastolic function parameters were normal. - Mitral valve: Mildly calcified annulus. There was mild to moderate regurgitation directed centrally.  Lexiscan sestamibi Stress test 2018: Nondiagnostic EKG.  Medium size defect of moderate intensity in the anterior wall suggestive breast attenuation, no evidence of ischemia, EF normal 64%.  Low risk study.   PFT 04/13/2018: PFTs show overinflation of lungs/air trapping. That may represent COPD in a smoker or asthma. She has Sjogren's and lung cysts. Sometimes Sjogrens can manifest itself in lung as this.  (Curb side consult with Dr. Vaughan Browner)  CTA of chest 2016:  No significant acute pulmonary embolus by CTA. Very small pericardial effusion. Right middle lobe nodular consolidative  bronchovascular opacities with adjacent fine tree-in-bud opacities, infectious/inflammatory process such as pneumonia is favored. This correlates with the chest x-ray finding. Recommend short-term follow-up at 4 weeks following treatment with a repeat two view chest exam. 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  Recent Labs:  11/29/2017: Creatinine 0.8, EGFR 73/89, potassium 4.2, CMP normal.  CBC normal.  Ferritin low at 19.7.  TSH 4.28.  Free T4 normal at 1.0.  Vitamin D normal.  Positive ANA, rheumatoid factor, positive Ro and [place], positive anticardiolipin antibody -  follows Dr. Bo Merino. Negative anti-DS DNA  Physical Exam  Constitutional: She is oriented to person, place, and time. Vital signs are normal. She appears well-developed and well-nourished.  HENT:  Head: Normocephalic and atraumatic.  Neck: Normal range of motion.  Cardiovascular: Normal rate, regular rhythm and intact distal pulses. Exam reveals a midsystolic click.  Murmur heard. High-pitched blowing holosystolic murmur is present with a grade of 2/6 at the apex. Pulmonary/Chest: Effort normal and breath sounds normal. No accessory muscle usage. No respiratory distress.  Abdominal: Soft. Bowel sounds are normal.  Musculoskeletal: Normal range of motion.  Neurological: She is alert and oriented to person, place, and time.  Skin: Skin is warm and dry.  Vitals reviewed.        Assessment & Recommendations:   Dyspnea on exertion  Malaise and fatigue  Sjogren's syndrome with keratoconjunctivitis sicca ([facility])  Atypical chest pain  Palpitations  Mild mitral regurgitation   EKG 03/31/2018: Normal sinus rhythm at rate of 68 bpm, normal axis.  No evidence of ischemia.  Low-voltage complexes. Unchanged from 12/01/2017.  Plan: Mrs. Aviah Sorci is a Caucasian female with long-standing history of fibromyalgia, rheumatoid arthritis with positive ANA and  rheumatoid factor, kerato conjunctivitis sicca, degenerative joint disease and lumbar disc disease.  Patient continues to have marked fatigue, decreased exercise tolerance, and dypsnea on exertion that is likely multifactoral. Awaiting sleep study for possible central sleep apnea and appreciate input from Dr. Brett Fairy.   I have discussed PFT results that suggest air trapping and over inflation without history of asthma or tobacco use. Will refer to Dr. Vaughan Browner as Dr. Einar Gip has previously obtained curbside consult from him regarding these findings. Could potentially have lung involvement from Sjogrens. Previous echocardiogram did not suggest any pulmonary hypertension. I will repeat her echocardiogram as it has been 2 years since this was last performed to ensure that she has not had any changes suggestive of pulmonary hypertension and  to also follow up on MR. No leg edema.   She has continued to have occasional episodes of palpitations that lasted for several hours; however, has not had any reoccurence over the last few days. She has previously worn a holter monitor that revealed PVC's. She reports that these symptoms were different. Feel that it would be beneficial to obtain event monitor for 30 days for further evaluation. She will call our office about scheduling this once her sleep study is performed.   Chest pain continues to be atypical and not associated with exertion. Has had negative nuclear stress test in 2018. Suspect non-cardiac etiology. Will continue to monitor.   Due to fibromyalgia and her underlying medical issues, patient has reduced her activity fairly significantly and also has poor sleeping due to right hip arthritis and hence deconditioning may be contributing to her symptoms. I have reinforced the importance of this and encouraged at least 20 mins of exercise daily.    *I have discussed this case with Dr. Einar Gip and he participated in formulating the plan.Jeri Lager, MSN,  APRN, FNP-C [facility], [place] Office: [phone] Fax: [phone]

## 2018-07-07 ENCOUNTER — Encounter: Payer: Self-pay | Admitting: Cardiology

## 2018-07-07 ENCOUNTER — Telehealth: Payer: Self-pay | Admitting: Cardiology

## 2018-07-07 NOTE — Telephone Encounter (Signed)
Discussed plan with patient after discussing with Dr. Jacinto Halim. She will contact us for monitor once sleep study is performed.

## 2018-07-20 ENCOUNTER — Ambulatory Visit (INDEPENDENT_AMBULATORY_CARE_PROVIDER_SITE_OTHER): Payer: 59 | Admitting: Neurology

## 2018-07-20 ENCOUNTER — Other Ambulatory Visit: Payer: Self-pay

## 2018-07-20 DIAGNOSIS — G4710 Hypersomnia, unspecified: Secondary | ICD-10-CM | POA: Diagnosis not present

## 2018-08-09 NOTE — Progress Notes (Signed)
Office Visit Note  Patient: Stefanie [name]             Date of Birth: 06-24-1960           MRN: [id number]             PCP: Creola Corn, MD Referring: Creola Corn, MD Visit Date: 08/23/2018 Occupation: @  Subjective:  Generalized pain   History of Present Illness: Catrina Fellenz is a 58 y.o. female with history of autoimmune disease, Sjogren's syndrome, osteoarthritis, and fibromyalgia.  She is taking Plaquenil 200 mg 1 tablet by mouth in the morning and half tablet by mouth in the evening.  She denies any signs or symptoms of an autoimmune flare.  She has been having frequent and severe fibromyalgia flares.  She has generalized muscle aches muscle tenderness due to fibromyalgia.  She states her level of fatigue has been severe lately.  She states that she typically goes to sleep around 7:30 PM and wakes up around 5 AM.  She states that she had a sleep study performed which was unremarkable.  She has been having increased neck and lower back pain.  She states her lower back pain is worse when lifting heavy objects.  She is also been experiencing trapezius muscle tension and muscle tenderness bilaterally.  She reports that she has been having worsening bilateral knee joint pain.  She states that she has intermittent joint swelling in both knee joints.  She states that she has difficulty climbing steps and occasionally getting up from a chair due to discomfort. She denies any recent rashes.  She continues to have sicca symptoms.  She has intermittent symptoms of Raynaud's but no ulcerations.  She denies any sores in her mouth or nose.  She continues to have palpitations intermittently and is followed by Dr. Jacinto Halim.  She will be wearing a Holter monitor starting tomorrow.  She denies any recent fevers or swollen lymph nodes.    Activities of Daily Living:  Patient reports morning stiffness for 5 hours.   Patient Reports nocturnal pain.  Difficulty dressing/grooming:  Reports Difficulty climbing stairs: Reports Difficulty getting out of chair: Reports Difficulty using hands for taps, buttons, cutlery, and/or writing: Reports  Review of Systems  Constitutional: Positive for fatigue.  HENT: Positive for mouth dryness. Negative for mouth sores and nose dryness.   Eyes: Positive for dryness. Negative for pain and visual disturbance.  Respiratory: Negative for cough, hemoptysis, shortness of breath and difficulty breathing.   Cardiovascular: Positive for swelling in legs/feet. Negative for chest pain, palpitations and hypertension.  Gastrointestinal: Negative for blood in stool, constipation and diarrhea.  Genitourinary: Negative for painful urination.  Musculoskeletal: Positive for arthralgias, joint pain, joint swelling, myalgias, muscle tenderness and myalgias. Negative for muscle weakness and morning stiffness.  Skin: Negative for color change, pallor, rash, hair loss, nodules/bumps, skin tightness, ulcers and sensitivity to sunlight.  Neurological: Negative for dizziness, numbness and headaches.  Hematological: Negative for bruising/bleeding tendency and swollen glands.  Psychiatric/Behavioral: Positive for sleep disturbance. Negative for depressed mood. The patient is not nervous/anxious.      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   Prolonged QT interval 07/28/2016  Primary osteoarthritis of both hands 05/11/2016   Primary osteoarthritis of both knees 05/11/2016   History of hypothyroidism 05/11/2016   History of migraine 05/11/2016   Autoimmune disease (HCC) positive ANA, 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, positive La,  anticardiolipin antibody and positive rheumatoid factor 04/29/2016   ANA positive 04/29/2016   Rheumatoid factor positive 04/29/2016   Pseudogout 04/29/2016   Chondrocalcinosis 04/29/2016   High risk medication use 04/29/2016   Sjogren's syndrome with keratoconjunctivitis sicca (HCC) 04/29/2016   History of neutropenia 04/29/2016   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   Hyponatremia 12/12/2013   Fever 12/13/2012   Pleuritic chest pain 12/06/2012   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   Anxiety 12/06/2012   ANEMIA-IRON DEFICIENCY 01/08/2010   GASTRIC DILATION, ACUTE 01/08/2010   NAUSEA ALONE 01/08/2010   ABDOMINAL PAIN, LEFT UPPER QUADRANT 01/08/2010    Past Medical History:  Diagnosis Date   Anemia    Arthritis    Asthma    Diabetes insipidus (HCC)    Fibromyalgia    Lupus (HCC)    Mitral valve prolapse    SLE (systemic lupus erythematosus) (HCC) 12/06/2012   Thyroid disease     Family History  Problem Relation Age of Onset   Diabetes Father    Heart disease Father    Hyperlipidemia Father    Hypertension Father    Stroke Father    Diabetes Sister    Cancer Maternal Grandmother    Diabetes Paternal Grandfather    Heart disease Paternal Grandfather    Hyperlipidemia Paternal Grandfather    Hypertension Paternal Grandfather    Past Surgical History:  Procedure Laterality Date   PITUITARY EXCISION     TONSILLECTOMY     Social History   Social History Narrative   Not on file    There is no immunization history on file for this patient.   Objective: Vital Signs: BP 122/74 (BP Location: Left Arm, Patient Position: Sitting, Cuff Size: Normal)    Pulse 76    Resp 16    Ht  (1.676 m)    Wt 153 lb 12.8 oz (69.8 kg)    LMP 04/09/2014 (Approximate)    BMI 24.82 kg/m    Physical Exam Vitals signs and nursing note reviewed.  Constitutional:      Appearance: She is well-developed.  HENT:     Head: Normocephalic  and atraumatic.  Eyes:     Conjunctiva/sclera: Conjunctivae normal.  Neck:     Musculoskeletal: Normal range of motion.  Cardiovascular:     Rate and Rhythm: Normal rate and regular rhythm.     Heart sounds: Normal heart sounds.  Pulmonary:     Effort: Pulmonary effort is normal.     Breath sounds: Normal breath sounds.  Abdominal:     General: Bowel sounds are normal.     Palpations: Abdomen is soft.  Lymphadenopathy:     Cervical: No cervical adenopathy.  Skin:    General: Skin is warm and dry.     Capillary Refill: Capillary refill takes less than 2 seconds.  Neurological:     Mental Status: She is alert and oriented to person, place, and time.  Psychiatric:        Behavior: Behavior normal.      Musculoskeletal Exam: Generalized hyperalgesia and positive tender points on exam.  C-spine limited range of motion with discomfort.  Thoracic and lumbar spine good range of motion.  She has pain with range of motion of the lumbar spine.  She has  no midline spinal tenderness at this time.  No SI joint tenderness.  Shoulder joints, elbow joints, wrist joints, MCPs, PIPs, DIPs good range of motion with no synovitis.  She has complete fist formation bilaterally.  Hip joints, knee joints, ankle joints, MTPs, PIPs, DIPs good range of motion no synovitis.  No warmth or effusion bilateral knee joints.  She has bilateral knee crepitus.  No tenderness swelling of ankle joints.  She has tenderness over bilateral trochanter bursa.  CDAI Exam: CDAI Score: -- Patient Global: --; Provider Global: -- Swollen: --; Tender: -- Joint Exam   No joint exam has been documented for this visit   There is currently no information documented on the homunculus. Go to the Rheumatology activity and complete the homunculus joint exam.  Investigation: No additional findings.  Imaging: No results found.  Recent Labs: Lab Results  Component Value Date   WBC 4.4 03/15/2018   HGB 14.2 03/15/2018   PLT 283  03/15/2018   NA 138 03/15/2018   K 4.8 03/15/2018   CL 101 03/15/2018   CO2 29 03/15/2018   GLUCOSE 81 03/15/2018   BUN 13 03/15/2018   CREATININE 0.68 03/15/2018   BILITOT 0.7 03/15/2018   ALKPHOS 97 07/30/2016   AST 31 03/15/2018   ALT 24 03/15/2018   PROT 7.9 03/15/2018   PROT 8.3 (H) 03/15/2018   ALBUMIN 3.6 07/30/2016   CALCIUM 9.8 03/15/2018   GFRAA 112 03/15/2018    Speciality Comments: PLQ Eye Exam 02/16/18 WNL @ [facility] follow up in 1 year  Procedures:  No procedures performed Allergies: Iron sucrose, Morphine, Venofer [ferric oxide], Azithromycin, and Other   Assessment / Plan:     Visit Diagnoses: Autoimmune disease (HCC) positive ANA, positive Ro, positive La, anticardiolipin antibody and positive rheumatoid factor -she has not had any signs or symptoms of a flare recently.  She is clinically doing well on Plaquenil 200 mg 1 tablet by mouth in the morning half tablet by mouth in the evening.  She has no synovitis on exam.  She is been having worsening bilateral knee pain, neck pain, and lower back pain.  X-rays of both knee joints were obtained today.  She has not had any recent rashes or photosensitivity.  She continues to have intermittent symptoms of Raynaud's but no digital stations or signs of gangrene were noted.  She has chronic sicca symptoms related to Sjogren syndrome.  She has not had any oral or nasal ulcerations recently.  She continues to have palpitations and is followed by Dr. Jacinto Halim.  She will be doing a trial with a Holter monitor starting tomorrow.  She has not had any shortness of breath recently.  She has chronic fatigue related to insomnia.  She had a sleep study that was unremarkable.  She has not had any fevers or swollen lymph nodes recently.  She will continue taking Plaquenil 200 mg 1 tab in the morning half tablet in the evening.  She does not need any refills at this time.  We will check autoimmune lab work today.  She will follow-up  in the office in 5 months.  Plan: Anti-DNA antibody, double-stranded, C3 and C4, Sedimentation rate, Urinalysis, Routine w reflex microscopic  High risk medication use - Plaquenil 200 mg 1 tablet in the morning and 1/2 tablet in the evening.  Last Plaquenil eye exam normal on 02/16/2018.  Most recent CBC/CMP within normal limits on 03/15/2018.  She is due for CBC/CMP today and will monitor every 5  months.- Plan: CBC with Differential/Platelet, COMPLETE METABOLIC PANEL WITH GFR  Sjogren's syndrome with keratoconjunctivitis sicca (HCC) -She continues to have sicca symptoms.  She has not had any salivary gland stones recently.  She states since her last visit she got dentures.  She continues to take Plaquenil 200 mg 1 tablet in the morning half tablet in the evening.  We will check CBC, CMP, and UA today.  SPEP was within normal limits on 03/15/2018.  Primary osteoarthritis of both hands -She has no synovitis or tenderness on exam.  She has complete fist formation bilaterally.  She has occasional discomfort in the left hand.  Joint protection and muscle strengthening were discussed.  Primary osteoarthritis of both knees -No warmth or effusion.  She has good range of motion.  She has chronic pain in bilateral knee joints.  She has bilateral knee crepitus.  She gives a history of intermittent joint swelling but no effusion was noted on exam today.  X-rays of both knees were obtained.  Chondromalacia patellae, right knee: She has right knee joint crepitus.   Chronic pain of both knees -She has chronic pain in bilateral knee joints.  She has not had any recent injuries or falls.  She has no mechanical symptoms at this time.  No warmth or effusion was noted.  She has good range of motion of bilateral knee joints.  She has bilateral knee crepitus.  X-rays of both knee joints were obtained today.  Plan: XR KNEE 3 VIEW LEFT, XR KNEE 3 VIEW RIGHT  Pain in right ankle and joints of right foot - MRI 12/23/2017 MRI of  right ankle revealed insertional achilles tendinosis with retrocalcaneal bursitis -She has no tenderness or inflammation at this time.  Cervical spondylosis without myelopathy -She has been experiencing increased neck pain over the past few months.  She has no symptoms of radiculopathy at this time.  She has been experiencing increased neck stiffness.  She has trapezius muscle tension and muscle tenderness bilaterally.  X-rays of her C-spine were obtained on 09/24/2017.  She declined a referral to physical therapy at this time.  She was encouraged to perform stretching exercises on a regular basis.  Chondrocalcinosis - She takes colchicine 0.6 mg 1 capsule by mouth daily.  Fibromyalgia -She has generalized hyperalgesia and positive tender points on exam.  She has been having severe and frequent fibromyalgia flares.  She has difficulties with ADLs and sleeping at night due to discomfort she experiences.  She is also been having severe fatigue.  She has difficulty sleeping at night.  She recently had an at-home sleep study which was unremarkable.  We discussed the importance of regular exercise and good sleep hygiene.  Other fatigue - Chronic and severe   Other medical conditions are listed as follows:  History of migraine   Prolonged QT interval   History of diabetes insipidus   History of hypothyroidism  History of anxiety    Orders: Orders Placed This Encounter  Procedures   XR KNEE 3 VIEW LEFT   XR KNEE 3 VIEW RIGHT   CBC with Differential/Platelet   COMPLETE METABOLIC PANEL WITH GFR   Anti-DNA antibody, double-stranded   C3 and C4   Sedimentation rate   Urinalysis, Routine w reflex microscopic   No orders of the defined types were placed in this encounter.   Face-to-face time spent with patient was 30 minutes. Greater than 50% of time was spent in counseling and coordination of care.  Follow-Up Instructions: Return in about  6 months (around 02/22/2019) for  Fibromyalgia, Autoimmune Disease.   Gearldine Bienenstock, PA-C   I examined and evaluated the patient with Sherron Ales PA.  Patient continues to have some generalized pain and discomfort.  She had no synovitis on examination.  She has ongoing discomfort in her cervical and lumbar spine.  On my examination she had no warmth swelling or effusion in her knee joints.  She has been having knee joint discomfort.  X-rays were consistent with mild to moderate osteoarthritis and mild to moderate chondromalacia patella.  I reviewed the x-rays with the patient.  I offered physical therapy which she declined.  Have advised her to try recumbent bike at home.  Have given her a handout on knee exercises.  The plan of  care was discussed as noted above.  Pollyann Savoy, MD Note - This record has been created using Animal nutritionist.  Chart creation errors have been sought, but may not always  have been located. Such creation errors do not reflect on  the standard of medical care.

## 2018-08-15 ENCOUNTER — Other Ambulatory Visit: Payer: Self-pay | Admitting: Physician Assistant

## 2018-08-15 DIAGNOSIS — M359 Systemic involvement of connective tissue, unspecified: Secondary | ICD-10-CM

## 2018-08-15 NOTE — Telephone Encounter (Addendum)
Last Visit: 03/15/2018 Next Visit: 08/23/2018 Labs: 07/04/2018  Eye exam: 02/16/2018  Okay to refill per Dr. Estanislado Pandy.

## 2018-08-16 NOTE — Procedures (Signed)
Patient Information     First Name: Stefanie [name]. Last Name: Rutan ID: [id number]  Birth Date: 09-Feb-1961 Age: 58 Gender: Female  Referring Provider: Dr. Adrian Prows BMI: 24.1 (W=150 lb, H=5' 6'')  Neck Circ.:  14 '' Epworth:  0/24  Sleep Study Information    Study Date: Aug 05, 2018 S/H/A Version: [id number] / 4.1.1531 / 18  History: Stefanie [name] is a 58 year old Caucasian right-handed female patient who works in the [employer]. She has seen Dr. Einar Gip who follows her for cardiac issues.  She has a history of rheumatoid arthritis with a positive ANA and RF, keratoconjunctivitis sicca, degenerative joint disease and lumbar disc disease.  She had a normal Lexi scan nuclear test with another Cardiologist and a normal echocardiogram there was just a mild mitral valve regurgitation- there was no pulmonary hypertension noted.  In 2018 she was encouraged to start regular exercises, preferably aquatic exercises, to improve her symptoms as Dr. Einar Gip initially felt that were related to deconditioning.  He also referred for a sleep medicine evaluation and for pulmonary function tests.   She has never presented with leg swelling she does not report orthopnea and she has not coughed blood.  Most of her symptoms are reflective of her marked fatigue but also often interrupted sleep pattern at night.     Diagnosis:     Overall Apnea hypopnea Index at 4.6/h was too mild to recommend intervention. REM sleep accentuated the AHI to 8.6/h.  The Patient slept on her left side only. There was no Hypoxemia noted and a moderate degree of tachy-bradycardia was seen.   Recommendations:       No intervention is recommended for this low degree of Apnea. The HST did not provide an explanation for the patient's symptoms.     Electronically Signed: Larey Seat, MD   08-16-2018           Sleep Summary  Oxygen Saturation Statistics   Start Study Time: End Study Time: Total Recording Time:   11:14:50 PM            7:22:23 AM      8 h, 7 min  Total Sleep Time % REM of Sleep Time:  7 h, 39 min  16.3    Mean: 96 Minimum: 93 Maximum: 100  Mean of Desaturations Nadirs (%):   95  Oxygen Desaturation %: 4-9 10-20 >20 Total  Events Number Total  10 100.0  0 0.0  0 0.0  10 100.0  Oxygen Saturation: <90 <=88 <85 <80 <70  Duration (minutes): Sleep % 0.0 0.0 0.0 0.0 0.0 0.0 0.0 0.0 0.0 0.0     Respiratory Indices      Total Events REM NREM All Night  pRDI:  42  pAHI:  35 ODI:  10  pAHIc:  15  % CSR: 0.0 9.7 8.1 3.2 4.2 4.7 3.9 0.9 1.8 5.5 4.6 1.3 2.2       Pulse Rate Statistics during Sleep (BPM)      Mean: 68 Minimum: 55 Maximum: 127    Indices are calculated using technically valid sleep time of  7 hrs, 37 min. Central-Indices are calculated using technically valid sleep time of  6  hrs, 42 min. pRDI/pAHI are calculated using oxi desaturations ? 3%  Body Position Statistics  Position Supine Prone Right Left Non-Supine  Sleep (min) 4.0 0.0 0.0 455.5 455.5  Sleep % 0.9 0.0 0.0 99.1 99.1  pRDI N/A N/A N/A 5.6 5.6  pAHI N/A N/A N/A 4.6 4.6  ODI N/A N/A N/A 1.3 1.3     Snoring Statistics Snoring Level (dB) >40 >50 >60 >70 >80 >Threshold (45)  Sleep (min) 154.6 4.6 1.1 0.0 0.0 11.9  Sleep % 33.6 1.0 0.2 0.0 0.0 2.6    Mean: 41 dB Sleep Stages Chart

## 2018-08-17 ENCOUNTER — Telehealth: Payer: Self-pay | Admitting: Neurology

## 2018-08-17 ENCOUNTER — Encounter: Payer: Self-pay | Admitting: Neurology

## 2018-08-17 NOTE — Telephone Encounter (Signed)
-----   Message from Larey Seat, MD sent at 08/16/2018  5:56 PM EDT ----- Home Sleep Study Result : No explanation for fatigue and hypersomnia found   Patient referred by Dr. Einar Gip -  HST on 08-06-2018   Please call and notify the patient that the recent home sleep test did not show any significant obstructive sleep apnea. Patient can follow up with the referring provider. A copy of the report will be sent to the patient, the PCP and referring MD, if other than PCP.  Once you have spoken to patient, you can close this encounter.   Thanks, Larey Seat, MD 1/65/5374, 8:27 PM  Certified in Neurology by ABPN Certified in [place] by [facility] [address], [place], [place] [address]

## 2018-08-17 NOTE — Telephone Encounter (Signed)
Called patient to discuss sleep study results. No answer at this time. LVM for the patient to call back.  Sending a [employer] as well

## 2018-08-22 ENCOUNTER — Other Ambulatory Visit: Payer: Self-pay | Admitting: Cardiology

## 2018-08-22 DIAGNOSIS — R002 Palpitations: Secondary | ICD-10-CM

## 2018-08-22 NOTE — Progress Notes (Signed)
3

## 2018-08-23 ENCOUNTER — Ambulatory Visit: Payer: 59 | Admitting: Rheumatology

## 2018-08-23 ENCOUNTER — Ambulatory Visit (INDEPENDENT_AMBULATORY_CARE_PROVIDER_SITE_OTHER): Payer: 59

## 2018-08-23 ENCOUNTER — Other Ambulatory Visit: Payer: Self-pay

## 2018-08-23 ENCOUNTER — Encounter: Payer: Self-pay | Admitting: Rheumatology

## 2018-08-23 ENCOUNTER — Ambulatory Visit: Payer: Self-pay

## 2018-08-23 VITALS — BP 122/74 | HR 76 | Resp 16 | Ht 66.0 in | Wt 153.8 lb

## 2018-08-23 DIAGNOSIS — M19041 Primary osteoarthritis, right hand: Secondary | ICD-10-CM

## 2018-08-23 DIAGNOSIS — M25562 Pain in left knee: Secondary | ICD-10-CM

## 2018-08-23 DIAGNOSIS — G8929 Other chronic pain: Secondary | ICD-10-CM | POA: Diagnosis not present

## 2018-08-23 DIAGNOSIS — M3501 Sicca syndrome with keratoconjunctivitis: Secondary | ICD-10-CM

## 2018-08-23 DIAGNOSIS — M1120 Other chondrocalcinosis, unspecified site: Secondary | ICD-10-CM

## 2018-08-23 DIAGNOSIS — M359 Systemic involvement of connective tissue, unspecified: Secondary | ICD-10-CM | POA: Diagnosis not present

## 2018-08-23 DIAGNOSIS — Z8659 Personal history of other mental and behavioral disorders: Secondary | ICD-10-CM

## 2018-08-23 DIAGNOSIS — M25571 Pain in right ankle and joints of right foot: Secondary | ICD-10-CM

## 2018-08-23 DIAGNOSIS — M19042 Primary osteoarthritis, left hand: Secondary | ICD-10-CM

## 2018-08-23 DIAGNOSIS — Z8639 Personal history of other endocrine, nutritional and metabolic disease: Secondary | ICD-10-CM

## 2018-08-23 DIAGNOSIS — M797 Fibromyalgia: Secondary | ICD-10-CM

## 2018-08-23 DIAGNOSIS — M2241 Chondromalacia patellae, right knee: Secondary | ICD-10-CM

## 2018-08-23 DIAGNOSIS — Z8669 Personal history of other diseases of the nervous system and sense organs: Secondary | ICD-10-CM

## 2018-08-23 DIAGNOSIS — M47812 Spondylosis without myelopathy or radiculopathy, cervical region: Secondary | ICD-10-CM

## 2018-08-23 DIAGNOSIS — Z79899 Other long term (current) drug therapy: Secondary | ICD-10-CM

## 2018-08-23 DIAGNOSIS — R5383 Other fatigue: Secondary | ICD-10-CM

## 2018-08-23 DIAGNOSIS — R9431 Abnormal electrocardiogram [ECG] [EKG]: Secondary | ICD-10-CM

## 2018-08-23 DIAGNOSIS — M25561 Pain in right knee: Secondary | ICD-10-CM | POA: Diagnosis not present

## 2018-08-23 DIAGNOSIS — M170 Bilateral primary osteoarthritis of knee: Secondary | ICD-10-CM

## 2018-08-23 NOTE — Patient Instructions (Signed)

## 2018-08-24 ENCOUNTER — Other Ambulatory Visit: Payer: Self-pay

## 2018-08-24 ENCOUNTER — Ambulatory Visit: Payer: 59

## 2018-08-24 DIAGNOSIS — R002 Palpitations: Secondary | ICD-10-CM

## 2018-08-24 NOTE — Telephone Encounter (Signed)
Called the patient home line for second time. This makes 3rd attempt to reach the pt. Advised the pt to call back to review SSR.

## 2018-08-25 LAB — URINALYSIS, ROUTINE W REFLEX MICROSCOPIC
Bilirubin Urine: NEGATIVE
Glucose, UA: NEGATIVE
Hgb urine dipstick: NEGATIVE
Ketones, ur: NEGATIVE
Leukocytes,Ua: NEGATIVE
Nitrite: NEGATIVE
Protein, ur: NEGATIVE
Specific Gravity, Urine: 1.011 (ref 1.001–1.03)
pH: 7 (ref 5.0–8.0)

## 2018-08-25 LAB — C3 AND C4
C3 Complement: 146 mg/dL (ref 83–193)
C4 Complement: 33 mg/dL (ref 15–57)

## 2018-08-25 NOTE — Telephone Encounter (Signed)
Called the patient on cell this time to attempt to reach her. No answer. LVM informing the patient we have tried to reach out. Informed her to call back or check her mychart

## 2018-08-30 LAB — COMPLETE METABOLIC PANEL WITH GFR
AG Ratio: 1.4 (calc) (ref 1.0–2.5)
ALT: 18 U/L (ref 6–29)
AST: 29 U/L (ref 10–35)
Albumin: 4.4 g/dL (ref 3.6–5.1)
Alkaline phosphatase (APISO): 88 U/L (ref 37–153)
BUN: 13 mg/dL (ref 7–25)
CO2: 27 mmol/L (ref 20–32)
Calcium: 9.8 mg/dL (ref 8.6–10.4)
Chloride: 101 mmol/L (ref 98–110)
Creat: 0.75 mg/dL (ref 0.50–1.05)
GFR, Est African American: 102 mL/min/{1.73_m2} (ref >=60)
GFR, Est Non African American: 88 mL/min/{1.73_m2} (ref >=60)
Globulin: 3.2 g/dL (calc) (ref 1.9–3.7)
Glucose, Bld: 90 mg/dL (ref 65–139)
Potassium: 3.9 mmol/L (ref 3.5–5.3)
Sodium: 138 mmol/L (ref 135–146)
Total Bilirubin: 0.6 mg/dL (ref 0.2–1.2)
Total Protein: 7.6 g/dL (ref 6.1–8.1)

## 2018-08-30 LAB — CBC WITH DIFFERENTIAL/PLATELET
Absolute Monocytes: 520 cells/uL (ref 200–950)
Basophils Absolute: 31 cells/uL (ref 0–200)
Basophils Relative: 0.6 %
Eosinophils Absolute: 99 cells/uL (ref 15–500)
Eosinophils Relative: 1.9 %
HCT: 40.2 % (ref 35.0–45.0)
Hemoglobin: 13.3 g/dL (ref 11.7–15.5)
Lymphs Abs: 1456 cells/uL (ref 850–3900)
MCH: 29.6 pg (ref 27.0–33.0)
MCHC: 33.1 g/dL (ref 32.0–36.0)
MCV: 89.5 fL (ref 80.0–100.0)
MPV: 11.7 fL (ref 7.5–12.5)
Monocytes Relative: 10 %
Neutro Abs: 3094 cells/uL (ref 1500–7800)
Neutrophils Relative %: 59.5 %
Platelets: 224 10*3/uL (ref 140–400)
RBC: 4.49 10*6/uL (ref 3.80–5.10)
RDW: 12.7 % (ref 11.0–15.0)
Total Lymphocyte: 28 %
WBC: 5.2 10*3/uL (ref 3.8–10.8)

## 2018-08-30 LAB — ANTI-DNA ANTIBODY, DOUBLE-STRANDED: ds DNA Ab: 1 IU/mL

## 2018-08-30 LAB — SEDIMENTATION RATE: Sed Rate: 29 mm/h (ref 0–30)

## 2018-08-30 NOTE — Progress Notes (Signed)
DsDNA is 1.  Labs do not reveal signs or symptoms of a flare.

## 2018-08-30 NOTE — Progress Notes (Signed)
CBC and CMP WNL.  Complements WNL.  Sed rate WNL.  UA WNL.

## 2018-08-31 ENCOUNTER — Telehealth: Payer: Self-pay

## 2018-08-31 NOTE — Telephone Encounter (Signed)
Pt needs to r/s appt due to AK not being in office

## 2018-09-05 ENCOUNTER — Ambulatory Visit: Payer: 59 | Admitting: Cardiology

## 2018-09-07 ENCOUNTER — Other Ambulatory Visit: Payer: Self-pay | Admitting: Physician Assistant

## 2018-09-07 NOTE — Telephone Encounter (Signed)
Last visit: 08/23/18 Next visit: 02/21/19 Labs: 08/29/18 CBC and CMP WNL.   Okay to refill per Dr. Estanislado Pandy

## 2018-10-04 ENCOUNTER — Ambulatory Visit: Payer: 59 | Admitting: Cardiology

## 2018-10-11 ENCOUNTER — Other Ambulatory Visit: Payer: Self-pay

## 2018-10-11 ENCOUNTER — Telehealth (INDEPENDENT_AMBULATORY_CARE_PROVIDER_SITE_OTHER): Payer: 59 | Admitting: Cardiology

## 2018-10-11 ENCOUNTER — Encounter: Payer: Self-pay | Admitting: Cardiology

## 2018-10-11 VITALS — Ht 66.0 in | Wt 148.0 lb

## 2018-10-11 DIAGNOSIS — M3501 Sicca syndrome with keratoconjunctivitis: Secondary | ICD-10-CM

## 2018-10-11 DIAGNOSIS — R0609 Other forms of dyspnea: Secondary | ICD-10-CM

## 2018-10-11 DIAGNOSIS — R002 Palpitations: Secondary | ICD-10-CM

## 2018-10-11 DIAGNOSIS — I340 Nonrheumatic mitral (valve) insufficiency: Secondary | ICD-10-CM

## 2018-10-11 NOTE — Progress Notes (Signed)
Subjective:   Stefanie [name], female    DOB: [birth year], 59 y.o.   MRN: [id number]  Stefanie Baton, MD:  CC: Palpitations, monitor results.  This visit type was conducted due to national recommendations for restrictions regarding the COVID-19 Pandemic (e.g. social distancing).  This format is felt to be most appropriate for this patient at this time.  All issues noted in this document were discussed and addressed.  No physical exam was performed (except for noted visual exam findings with Telehealth visits).  The patient has consented to conduct a Telehealth visit and understands insurance will be billed.   I discussed the limitations of evaluation and management by telemedicine and the availability of in person appointments. The patient expressed understanding and agreed to proceed.  Virtual Visit via Video Note is as below  I connected with Stefanie [name], on 10/11/2018 at 1530 by a video enabled telemedicine application and verified that I am speaking with the correct person using two identifiers.     I have discussed with her regarding the safety during COVID Pandemic and steps and precautions including social distancing with the patient.    HPI: Stefanie [name]  is a 58 y.o. female  with long-standing history of fibromyalgia, rheumatoid arthritis with positive ANA and rheumatoid factor, Sjogren's syndrome with keratoconjunctivitis sicca, degenerative joint disease and lumbar disc disease.  Patient has been evaluated for atypical chest pain and also dyspnea and has had normal Lexiscan nuclear stress test and echocardiogram except for mild to moderate MR without pulmonary hypertension in 2018.  As her symptoms were concerning for possible underlying central sleep apnea, we had referred to sleep medicine for evaluation of possible central sleep apnea. She was evaluated by Dr. Brett Fairy, and underwent sleep study that was negative. She did have PFT's also performed that showed a  reduced diffusing capacity, minimal airway obstruction and overinflation.  At her last office visit, she continued to report significant fatigue and worsening palpitations. She underwent 30 day event monitor and is here to discuss results.   She reports that her job is now requiring more walking, and since doing this, she has noticed improvement in dyspnea and decreased exercise tolerance. She also did not have any significant palpitations over the last 1 month. She does continue to have fatigue. Overall she feels that she is better with being more active.   She is followed by Dr. Estanislado Pandy for management of autoimmune diseases. She was referred to pulmonary medicine at her last office visit given her significant dyspnea and mild abnormalities on PFT's, but did not go for this appt.    Past Medical History:  Diagnosis Date  . Anemia   . Arthritis   . Asthma   . Diabetes insipidus ([place])   . Fibromyalgia   . Lupus ([place])   . Mitral valve prolapse   . SLE (systemic lupus erythematosus) ([place]) 12/06/2012  . Thyroid disease     Past Surgical History:  Procedure Laterality Date  . PITUITARY EXCISION    . TONSILLECTOMY      Family History  Problem Relation Age of Onset  . Diabetes Father   . Heart disease Father   . Hyperlipidemia Father   . Hypertension Father   . Stroke Father   . Diabetes Sister   . Cancer Maternal Grandmother   . Diabetes Paternal Grandfather   . Heart disease Paternal Grandfather   . Hyperlipidemia Paternal Grandfather   . Hypertension Paternal Grandfather  Social History   Socioeconomic History  . Marital status: Married    Spouse name: Not on file  . Number of children: 2  . Years of education: Not on file  . Highest education level: Not on file  Occupational History  . Not on file  Social Needs  . Financial resource strain: Not on file  . Food insecurity    Worry: Not on file    Inability: Not on file  . Transportation needs    Medical:  Not on file    Non-medical: Not on file  Tobacco Use  . Smoking status: Never Smoker  . Smokeless tobacco: Never Used  Substance and Sexual Activity  . Alcohol use: No  . Drug use: No  . Sexual activity: Not on file  Lifestyle  . Physical activity    Days per week: Not on file    Minutes per session: Not on file  . Stress: Not on file  Relationships  . Social Herbalist on phone: Not on file    Gets together: Not on file    Attends religious service: Not on file    Active member of club or organization: Not on file    Attends meetings of clubs or organizations: Not on file    Relationship status: Not on file  . Intimate partner violence    Fear of current or ex partner: Not on file    Emotionally abused: Not on file    Physically abused: Not on file    Forced sexual activity: Not on file  Other Topics Concern  . Not on file  Social History Narrative  . Not on file    No outpatient medications have been marked as taking for the 10/11/18 encounter (Appointment) with Miquel Dunn, NP.     Review of Systems  Constitution: Positive for malaise/fatigue (chronic and improved the last few days). Negative for decreased appetite, weight gain and weight loss.  Eyes: Negative for visual disturbance.  Cardiovascular: Positive for dyspnea on exertion (improved). Negative for chest pain, claudication, leg swelling, orthopnea, palpitations (lasting 3-4 hours; during rest) and syncope.  Respiratory: Negative for hemoptysis and wheezing.   Endocrine: Negative for cold intolerance and heat intolerance.  Hematologic/Lymphatic: Does not bruise/bleed easily.  Skin: Negative for nail changes.  Musculoskeletal: Positive for joint pain. Negative for muscle weakness and myalgias.  Gastrointestinal: Negative for abdominal pain, change in bowel habit, nausea and vomiting.  Neurological: Negative for difficulty with concentration, dizziness, focal weakness and headaches.   Psychiatric/Behavioral: Negative for altered mental status and suicidal ideas.  All other systems reviewed and are negative.      Objective:     Last menstrual period 04/09/2014.  Cardiac studies:  30-day event monitor 6/17-7/16/2020: Normal sinus rhythm. One auto detected event occurred on day 30 at 1647 for sinus tachycardia at 149 bpm. No patient triggered events occurred. Fastest heart rate was 149 bpm. No A. fib or SVT was noted.  Outside Echocardiogram 05/08/2016: - Left ventricle: The cavity size was normal. Systolic function was   normal. The estimated ejection fraction was in the range of 55%   to 60%. Wall motion was normal; there were no regional wall   motion abnormalities. Left ventricular diastolic function parameters were normal. - Mitral valve: Mildly calcified annulus. There was mild to moderate regurgitation directed centrally.  Lexiscan sestamibi Stress test 2018: Nondiagnostic EKG.  Medium size defect of moderate intensity in the anterior wall suggestive breast attenuation, no  evidence of ischemia, EF normal 64%.  Low risk study.  PFT 04/13/2018: PFTs show overinflation of lungs/air trapping. That may represent COPD in a smoker or asthma. She has Sjogren's and lung cysts. Sometimes Sjogrens can manifest itself in lung as this.  (Curb side consult with Dr. Vaughan Browner)  CTA of chest 2016:  No significant acute pulmonary embolus by CTA. Very small pericardial effusion. Right middle lobe nodular consolidative bronchovascular opacities with adjacent fine tree-in-bud opacities, infectious/inflammatory process such as pneumonia is favored. This correlates with the chest x-ray finding. Recommend short-term follow-up at 4 weeks following treatment with a repeat two view chest exam. 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  Recent Labs:  11/29/2017: Creatinine 0.8, EGFR 73/89, potassium 4.2, CMP normal.  CBC normal.  Ferritin low  at 19.7.  TSH 4.28.  Free T4 normal at 1.0.  Vitamin D normal.  Positive ANA, rheumatoid factor, positive Ro and [place], positive anticardiolipin antibody -  follows Dr. Bo Merino. Negative anti-DS DNA  Physical Exam  Constitutional: She is oriented to person, place, and time. Vital signs are normal. She appears well-developed and well-nourished.  HENT:  Head: Normocephalic and atraumatic.  Neck: Normal range of motion.  Cardiovascular: Normal rate, regular rhythm and intact distal pulses. Exam reveals a midsystolic click.  Murmur heard. High-pitched blowing holosystolic murmur is present with a grade of 2/6 at the apex. Pulmonary/Chest: Effort normal and breath sounds normal. No accessory muscle usage. No respiratory distress.  Abdominal: Soft. Bowel sounds are normal.  Musculoskeletal: Normal range of motion.  Neurological: She is alert and oriented to person, place, and time.  Skin: Skin is warm and dry.  Vitals reviewed.        Assessment & Recommendations:   No diagnosis found.  EKG 03/31/2018: Normal sinus rhythm at rate of 68 bpm, normal axis.  No evidence of ischemia.  Low-voltage complexes. Unchanged from 12/01/2017.  Plan: Mrs. Stefanie [name] is a Caucasian female with long-standing history of fibromyalgia, rheumatoid arthritis with positive ANA and rheumatoid factor, kerato conjunctivitis sicca, degenerative joint disease and lumbar disc disease.  I discussed recently obtained event monitor, had one episode of sinus tachycardia that occurred likely while cooking dinner.  No arrhythmias were noted.  She has not had any recent palpitations.  Dyspnea on exertion and chest discomfort have improved now that she has been more active with her job.  I continue to feel that her symptoms are related to inactivity.  Urged her to continue with regular exercise if there are changes to her job.  She continues to be followed by Dr. Estanislado Pandy for management of rheumatoid arthritis,  fibromyalgia, and surgeons.  Overall she has been stable from the standpoint.  She had negative sleep study.  She did have mild to moderate MR by previous echocardiogram 2 years ago, will repeat echocardiogram for surveillance.  She has previously undergone PFTs that did show air trapping potentially related to surgeons manifestation.  I have made referral to pulmonary at her last office visit; however, she did not make this appointment.  As her shortness of breath has improved at this time she wishes to hold off on referral. I will see her back in 6 months or sooner if needed.   Stefanie Lager, MSN, APRN, FNP-C [facility] [place] Office: [phone] Fax: [phone]

## 2018-12-05 NOTE — Progress Notes (Signed)
Office Visit Note  Patient: Stefanie [name]             Date of Birth: 09/13/1960           MRN: [id number]             PCP: Creola Corn, MD Referring: Creola Corn, MD Visit Date: 12/06/2018 Occupation: @  [employer] e:  Right knee pain     History of Present Illness: Stefanie [name] is a 58 y.o. female with history of autoimmune disease, Sjogren's syndrome, known chondrocalcinosis, fibromyalgia.  Patient is taking Plaquenil 200 mg 1 tablet in the morning and half tablet in the evening.  She denies any recent autoimmune flares.  She denies any recent rashes, hair loss, or photosensitivity.  She continues to have intermittent symptoms of Raynaud's but no digital ulcerations or signs of gangrene were noted.  She has chronic sicca symptoms but no nasal or oral ulcerations.  She has chronic fatigue but has not had any fevers or swollen lymph nodes recently.  She states that overall her fibromyalgia pain has been more manageable recently.  She continues have difficulty sleeping at night which worsens her level of fatigue.  She presents today with bilateral lower extremity swelling.  She states that she has noticed increased swelling over the past 3 weeks.  She states that she walks at her job for about 4 hours daily.  She states she has been having Achilles tenderness bilaterally for the past 1-1/2 weeks.  She continues to have chronic pain in both knee joints especially the right knee joint.  She had a cortisone injection on 03/15/18, which helped for about 5 months.  She takes colchicine 0.6 mg 1 capsule by mouth daily.  She has not missed any doses recently.   Activities of Daily Living:  Patient reports morning stiffness for 1-2 hours.   Patient Reports nocturnal pain.  Difficulty dressing/grooming: Denies Difficulty climbing stairs: Reports Difficulty getting out of chair: Reports Difficulty using hands for taps, buttons, cutlery, and/or writing: Reports  Review of Systems    Constitutional: Positive for fatigue.  HENT: Positive for mouth dryness. Negative for mouth sores and nose dryness.   Eyes: Positive for dryness. Negative for pain and visual disturbance.  Respiratory: Negative for cough, hemoptysis, shortness of breath and difficulty breathing.   Cardiovascular: Positive for palpitations. Negative for chest pain, hypertension and swelling in legs/feet.  Gastrointestinal: Negative for blood in stool, constipation and diarrhea.  Endocrine: Negative for increased urination.  Genitourinary: Negative for painful urination.  Musculoskeletal: Positive for arthralgias, joint pain, joint swelling and morning stiffness. Negative for myalgias, muscle weakness, muscle tenderness and myalgias.  Skin: Positive for color change. Negative for pallor, rash, hair loss, nodules/bumps, skin tightness, ulcers and sensitivity to sunlight.  Allergic/Immunologic: Negative for susceptible to infections.  Neurological: Negative for dizziness, numbness, headaches and weakness.  Hematological: Negative for swollen glands.  Psychiatric/Behavioral: Positive for sleep disturbance. Negative for depressed mood. The patient is not nervous/anxious.     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   Prolonged QT interval 07/28/2016   Primary osteoarthritis of both hands 05/11/2016   Primary osteoarthritis of both knees 05/11/2016  History of hypothyroidism 05/11/2016   History of migraine 05/11/2016   Autoimmune disease (HCC) positive ANA, 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, 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, anticardiolipin antibody and positive rheumatoid factor 04/29/2016   ANA positive  04/29/2016   Rheumatoid factor positive 04/29/2016   Pseudogout 04/29/2016   Chondrocalcinosis 04/29/2016   High risk medication use 04/29/2016   Sjogren's syndrome with keratoconjunctivitis sicca (HCC) 04/29/2016   History of neutropenia 04/29/2016   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   Hyponatremia 12/12/2013   Fever 12/13/2012   Pleuritic chest pain 12/06/2012   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   Anxiety 12/06/2012   ANEMIA-IRON DEFICIENCY 01/08/2010   GASTRIC DILATION, ACUTE 01/08/2010   NAUSEA ALONE 01/08/2010   ABDOMINAL PAIN, LEFT UPPER QUADRANT 01/08/2010    Past Medical History:  Diagnosis Date   Anemia    Arthritis    Asthma    Diabetes insipidus (HCC)    Fibromyalgia    Lupus (HCC)    Mitral valve prolapse    SLE (systemic lupus erythematosus) (HCC) 12/06/2012   Thyroid disease     Family History  Problem Relation Age of Onset   Diabetes Father    Heart disease Father    Hyperlipidemia Father    Hypertension Father    Stroke Father    Diabetes Sister    Cancer Maternal Grandmother    Diabetes Paternal Grandfather    Heart disease Paternal Grandfather    Hyperlipidemia Paternal Grandfather    Hypertension Paternal Grandfather    Past Surgical History:  Procedure Laterality Date   PITUITARY EXCISION     TONSILLECTOMY     Social History   Social History Narrative   Not on file    There is no immunization history on file for this patient.   Objective: Vital Signs: BP 117/77 (BP Location: Right Arm, Patient Position: Sitting, Cuff Size: Normal)    Pulse 68    Resp 13    Ht  (1.702 m)    Wt 155 lb (70.3 kg)    LMP 04/09/2014 (Approximate)    BMI 24.28 kg/m    Physical Exam Vitals signs and nursing note reviewed.  Constitutional:      Appearance: She is well-developed.  HENT:     Head: Normocephalic and atraumatic.  Eyes:     Conjunctiva/sclera: Conjunctivae normal.  Neck:      Musculoskeletal: Normal range of motion.  Cardiovascular:     Rate and Rhythm: Normal rate and regular rhythm.     Heart sounds: Normal heart sounds.  Pulmonary:     Effort: Pulmonary effort is normal.     Breath sounds: Normal breath sounds.  Abdominal:     General: Bowel sounds are normal.     Palpations: Abdomen is soft.  Lymphadenopathy:     Cervical: No cervical adenopathy.  Skin:    General: Skin is warm and dry.     Capillary Refill: Capillary refill takes less than 2 seconds.     Comments: She has few scattered subcutaneous almost calcified lesions on her fingers.  Neurological:     Mental Status: She is alert and oriented to person, place, and time.  Psychiatric:        Behavior: Behavior normal.      Musculoskeletal Exam: C-spine, thoracic spine, and lumbar spine good ROM.  No midline spinal tenderness.  No SI joint tenderness.  Shoulder joints, elbow joints, wrist joints, MCPs, PIPs, and DIPs good ROM with no synovitis.  Complete fist formation bilaterally.  Hip joints  good ROM.  Right knee inflammation and warmth noted.  Left knee good ROM with no warmth or effusion.  Pitting edema in bilateral lower extremities.   CDAI Exam: CDAI Score: -- Patient Global: --; Provider Global: -- Swollen: --; Tender: -- Joint Exam   No joint exam has been documented for this visit   There is currently no information documented on the homunculus. Go to the Rheumatology activity and complete the homunculus joint exam.  Investigation: No additional findings.  Imaging: No results found.  Recent Labs: Lab Results  Component Value Date   WBC 5.2 08/29/2018   HGB 13.3 08/29/2018   PLT 224 08/29/2018   NA 138 08/29/2018   K 3.9 08/29/2018   CL 101 08/29/2018   CO2 27 08/29/2018   GLUCOSE 90 08/29/2018   BUN 13 08/29/2018   CREATININE 0.75 08/29/2018   BILITOT 0.6 08/29/2018   ALKPHOS 97 07/30/2016   AST 29 08/29/2018   ALT 18 08/29/2018   PROT 7.6 08/29/2018   ALBUMIN  3.6 07/30/2016   CALCIUM 9.8 08/29/2018   GFRAA 102 08/29/2018    Speciality Comments: [facility]  Procedures:  Large Joint Inj: R knee on 12/06/2018 2:57 PM Indications: pain Details: 27 G 1.5 in needle, medial approach  Arthrogram: No  Medications: 1.5 mL lidocaine 1 %; 40 mg triamcinolone acetonide 40 MG/ML Aspirate: 0 mL Outcome: tolerated well, no immediate complications Procedure, treatment alternatives, risks and benefits explained, specific risks discussed. Consent was given by the patient. Immediately prior to procedure a time out was called to verify the correct patient, procedure, equipment, support staff and site/side marked as required. Patient was prepped and draped in the usual sterile fashion.     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  She has not had any signs or symptoms of a flare recently.  She presents today with chronic pain in both knee joints worse in the right knee.  She has inflammation and warmth of the right knee on exam.  She requested a right knee joint cortisone injection today.  She has no other joint synovitis on exam.  She has not had any recent rashes, photosensitivity, hair loss, oral nasal ulcerations.  She does have chronic sicca symptoms.  She has intermittent symptoms of Raynaud's but no digital ulcerations or signs of gangrene were noted.  She has chronic fatigue but has not had any fevers or swollen lymph nodes.  She has intermittent palpitations but has not had any increase shortness of breath or chest pain.  She will continue taking Plaquenil 200 mg 1 tablet in the morning and  half tablet in the evening.  She does not need a refill at this time.  She was advised to notify us if she develops any new or worsening symptoms.  She will follow-up in the office in 5 months.  She has some subcutaneous lesions.  High risk medication use - Plaquenil 200 mg 1 tablet in the morning and 1/2 tablet in the evening.  Last Plaquenil eye exam normal on 02/16/2018.  CBC and CMP were within normal limits on 08/29/2018.  She will be due to update lab work  in November and every 5 months to monitor for drug toxicity  Sjogren's syndrome with keratoconjunctivitis sicca (HCC): She has chronic sicca symptoms.  She has not had any enlarged symptoms or parotid swelling.  She continues to take Plaquenil 200 mg 1 tablet in the morning and half tablet in the evening.  She will continue on this current treatment regimen.  CBC, CMP, and UA were within normal limits on 08/29/2018.  Pain in right ankle and joints of right foot - MRI 12/23/2017 MRI of right ankle revealed insertional achilles tendinosis with retrocalcaneal bursitis.   Primary osteoarthritis of both hands: She has mild PIP and DIP synovial thickening cyst with osteoarthritis of both hands.  She has no tenderness or synovitis on exam.  She has complete fist formation bilaterally.  Joint protection and muscle strengthening were discussed.  Primary osteoarthritis of both knees: She has chronic pain in both knee joints.  She has right knee joint inflammation and warmth on exam today.  Left knee has no warmth or effusion.  She has difficulty climbing steps and getting up from a chair.  She walks about 4 hours daily at work which exacerbates the discomfort.  She requested a right knee joint cortisone injection.  She tolerated the procedure well.  Procedure note was completed above.  We also discussed applying for Visco gel injections for both knee joints.  She was given a handout of knee exercises to perform.  Chondromalacia patellae, right knee: She has  right knee crepitus on exam.  She has inflammation and warmth of the right knee on exam.  She requested a right knee joint cortisone injection.  We discussed applying for Visco gel injections in the future as well.  Cervical spondylosis without myelopathy: She experiences intermittent discomfort in her C-spine.  She has good range of motion with no discomfort on exam today.  She has no symptoms of radiculopathy.  Chondrocalcinosis - She takes colchicine 0.6 mg 1 capsule by mouth daily.  Fibromyalgia: She reports that overall her fibromyalgia pain has been manageable recently.  She denies any muscle aches or muscle tenderness at this time.  She continues to have chronic fatigue related to insomnia.  She is been having difficulty sleeping at night due to nocturnal pain in both knee joints.  According to the patient she walks for hours daily while at work but denies any other exercise at this time.  Other fatigue: Chronic and related to insomnia.  She was encouraged to stay active and exercise on a regular basis.  Other medical conditions are listed as follows:  History of anxiety  History of hypothyroidism  History of migraine  History of diabetes insipidus  Prolonged QT interval  Orders: Orders Placed This Encounter  Procedures   Large Joint Inj   No orders of the defined types were placed in this encounter.   Face-to-face time spent with patient was 30 minutes. Greater than 50% of time was spent in counseling and coordination of care.  Follow-Up Instructions: Return for Autoimmune Disease, Osteoarthritis, Fibromyalgia.   Gearldine Bienenstock, PA-C   I examined and evaluated the patient with Sherron Ales PA.  Patient had significant pedal edema on bilateral lower extremities which she was concerned about.  I have advised her to use compression socks.  Have also advised her to discuss this further with Dr. Timothy Lasso.  She had some inflammation in her right knee joint which was warm to touch.   After informed consent was obtained I injected her right knee  joint as described above.  She has some new subcutaneous lesions on her fingers which appear to be calcified.  I have advised her to schedule an appointment with dermatology for evaluation and possible biopsy for analysis.  The plan of care was discussed as noted above.  Bo Merino, MD  Note - This record has been created using Editor, commissioning.  Chart creation errors have been sought, but may not always  have been located. Such creation errors do not reflect on  the standard of medical care.

## 2018-12-06 ENCOUNTER — Ambulatory Visit: Payer: 59 | Admitting: Physician Assistant

## 2018-12-06 ENCOUNTER — Other Ambulatory Visit: Payer: Self-pay

## 2018-12-06 ENCOUNTER — Encounter: Payer: Self-pay | Admitting: Physician Assistant

## 2018-12-06 VITALS — BP 117/77 | HR 68 | Resp 13 | Ht 67.0 in | Wt 155.0 lb

## 2018-12-06 DIAGNOSIS — R9431 Abnormal electrocardiogram [ECG] [EKG]: Secondary | ICD-10-CM

## 2018-12-06 DIAGNOSIS — L989 Disorder of the skin and subcutaneous tissue, unspecified: Secondary | ICD-10-CM

## 2018-12-06 DIAGNOSIS — M25561 Pain in right knee: Secondary | ICD-10-CM

## 2018-12-06 DIAGNOSIS — M2241 Chondromalacia patellae, right knee: Secondary | ICD-10-CM

## 2018-12-06 DIAGNOSIS — M19041 Primary osteoarthritis, right hand: Secondary | ICD-10-CM

## 2018-12-06 DIAGNOSIS — G8929 Other chronic pain: Secondary | ICD-10-CM | POA: Diagnosis not present

## 2018-12-06 DIAGNOSIS — M25571 Pain in right ankle and joints of right foot: Secondary | ICD-10-CM

## 2018-12-06 DIAGNOSIS — M170 Bilateral primary osteoarthritis of knee: Secondary | ICD-10-CM

## 2018-12-06 DIAGNOSIS — Z8659 Personal history of other mental and behavioral disorders: Secondary | ICD-10-CM

## 2018-12-06 DIAGNOSIS — M359 Systemic involvement of connective tissue, unspecified: Secondary | ICD-10-CM

## 2018-12-06 DIAGNOSIS — R5383 Other fatigue: Secondary | ICD-10-CM

## 2018-12-06 DIAGNOSIS — M19042 Primary osteoarthritis, left hand: Secondary | ICD-10-CM

## 2018-12-06 DIAGNOSIS — M3501 Sicca syndrome with keratoconjunctivitis: Secondary | ICD-10-CM | POA: Diagnosis not present

## 2018-12-06 DIAGNOSIS — M47812 Spondylosis without myelopathy or radiculopathy, cervical region: Secondary | ICD-10-CM

## 2018-12-06 DIAGNOSIS — Z79899 Other long term (current) drug therapy: Secondary | ICD-10-CM | POA: Diagnosis not present

## 2018-12-06 DIAGNOSIS — Z8639 Personal history of other endocrine, nutritional and metabolic disease: Secondary | ICD-10-CM

## 2018-12-06 DIAGNOSIS — M1120 Other chondrocalcinosis, unspecified site: Secondary | ICD-10-CM

## 2018-12-06 DIAGNOSIS — M797 Fibromyalgia: Secondary | ICD-10-CM

## 2018-12-06 DIAGNOSIS — Z8669 Personal history of other diseases of the nervous system and sense organs: Secondary | ICD-10-CM

## 2018-12-06 MED ORDER — LIDOCAINE HCL 1 % IJ SOLN
1.5000 mL | INTRAMUSCULAR | Status: AC | PRN
  Administered 2018-12-06: 1.5 mL

## 2018-12-06 MED ORDER — TRIAMCINOLONE ACETONIDE 40 MG/ML IJ SUSP
40.0000 mg | INTRAMUSCULAR | Status: AC | PRN
  Administered 2018-12-06: 40 mg via INTRA_ARTICULAR

## 2018-12-06 NOTE — Patient Instructions (Signed)
Journal for Nurse Practitioners, 15(4), 263-267. Retrieved December 13, 2017 from http://clinicalkey.com/nursing">  Knee Exercises Ask your health care provider which exercises are safe for you. Do exercises exactly as told by your health care provider and adjust them as directed. It is normal to feel mild stretching, pulling, tightness, or discomfort as you do these exercises. Stop right away if you feel sudden pain or your pain gets worse. Do not begin these exercises until told by your health care provider. Stretching and range-of-motion exercises These exercises warm up your muscles and joints and improve the movement and flexibility of your knee. These exercises also help to relieve pain and swelling. Knee extension, prone 1. Lie on your abdomen (prone position) on a bed. 2. Place your left / right knee just beyond the edge of the surface so your knee is not on the bed. You can put a towel under your left / right thigh just above your kneecap for comfort. 3. Relax your leg muscles and allow gravity to straighten your knee (extension). You should feel a stretch behind your left / right knee. 4. Hold this position for __________ seconds. 5. Scoot up so your knee is supported between repetitions. Repeat __________ times. Complete this exercise __________ times a day. Knee flexion, active  1. Lie on your back with both legs straight. If this causes back discomfort, bend your left / right knee so your foot is flat on the floor. 2. Slowly slide your left / right heel back toward your buttocks. Stop when you feel a gentle stretch in the front of your knee or thigh (flexion). 3. Hold this position for __________ seconds. 4. Slowly slide your left / right heel back to the starting position. Repeat __________ times. Complete this exercise __________ times a day. Quadriceps stretch, prone  1. Lie on your abdomen on a firm surface, such as a bed or padded floor. 2. Bend your left / right knee and hold  your ankle. If you cannot reach your ankle or pant leg, loop a belt around your foot and grab the belt instead. 3. Gently pull your heel toward your buttocks. Your knee should not slide out to the side. You should feel a stretch in the front of your thigh and knee (quadriceps). 4. Hold this position for __________ seconds. Repeat __________ times. Complete this exercise __________ times a day. Hamstring, supine 1. Lie on your back (supine position). 2. Loop a belt or towel over the ball of your left / right foot. The ball of your foot is on the walking surface, right under your toes. 3. Straighten your left / right knee and slowly pull on the belt to raise your leg until you feel a gentle stretch behind your knee (hamstring). ? Do not let your knee bend while you do this. ? Keep your other leg flat on the floor. 4. Hold this position for __________ seconds. Repeat __________ times. Complete this exercise __________ times a day. Strengthening exercises These exercises build strength and endurance in your knee. Endurance is the ability to use your muscles for a long time, even after they get tired. Quadriceps, isometric This exercise stretches the muscles in front of your thigh (quadriceps) without moving your knee joint (isometric). 1. Lie on your back with your left / right leg extended and your other knee bent. Put a rolled towel or small pillow under your knee if told by your health care provider. 2. Slowly tense the muscles in the front of your left /   right thigh. You should see your kneecap slide up toward your hip or see increased dimpling just above the knee. This motion will push the back of the knee toward the floor. 3. For __________ seconds, hold the muscle as tight as you can without increasing your pain. 4. Relax the muscles slowly and completely. Repeat __________ times. Complete this exercise __________ times a day. Straight leg raises This exercise stretches the muscles in front  of your thigh (quadriceps) and the muscles that move your hips (hip flexors). 1. Lie on your back with your left / right leg extended and your other knee bent. 2. Tense the muscles in the front of your left / right thigh. You should see your kneecap slide up or see increased dimpling just above the knee. Your thigh may even shake a bit. 3. Keep these muscles tight as you raise your leg 4-6 inches (10-15 cm) off the floor. Do not let your knee bend. 4. Hold this position for __________ seconds. 5. Keep these muscles tense as you lower your leg. 6. Relax your muscles slowly and completely after each repetition. Repeat __________ times. Complete this exercise __________ times a day. Hamstring, isometric 1. Lie on your back on a firm surface. 2. Bend your left / right knee about __________ degrees. 3. Dig your left / right heel into the surface as if you are trying to pull it toward your buttocks. Tighten the muscles in the back of your thighs (hamstring) to "dig" as hard as you can without increasing any pain. 4. Hold this position for __________ seconds. 5. Release the tension gradually and allow your muscles to relax completely for __________ seconds after each repetition. Repeat __________ times. Complete this exercise __________ times a day. Hamstring curls If told by your health care provider, do this exercise while wearing ankle weights. Begin with __________ lb weights. Then increase the weight by 1 lb (0.5 kg) increments. Do not wear ankle weights that are more than __________ lb. 1. Lie on your abdomen with your legs straight. 2. Bend your left / right knee as far as you can without feeling pain. Keep your hips flat against the floor. 3. Hold this position for __________ seconds. 4. Slowly lower your leg to the starting position. Repeat __________ times. Complete this exercise __________ times a day. Squats This exercise strengthens the muscles in front of your thigh and knee  (quadriceps). 1. Stand in front of a table, with your feet and knees pointing straight ahead. You may rest your hands on the table for balance but not for support. 2. Slowly bend your knees and lower your hips like you are going to sit in a chair. ? Keep your weight over your heels, not over your toes. ? Keep your lower legs upright so they are parallel with the table legs. ? Do not let your hips go lower than your knees. ? Do not bend lower than told by your health care provider. ? If your knee pain increases, do not bend as low. 3. Hold the squat position for __________ seconds. 4. Slowly push with your legs to return to standing. Do not use your hands to pull yourself to standing. Repeat __________ times. Complete this exercise __________ times a day. Wall slides This exercise strengthens the muscles in front of your thigh and knee (quadriceps). 1. Lean your back against a smooth wall or door, and walk your feet out 18-24 inches (46-61 cm) from it. 2. Place your feet hip-width apart. 3.   Slowly slide down the wall or door until your knees bend __________ degrees. Keep your knees over your heels, not over your toes. Keep your knees in line with your hips. 4. Hold this position for __________ seconds. Repeat __________ times. Complete this exercise __________ times a day. Straight leg raises This exercise strengthens the muscles that rotate the leg at the hip and move it away from your body (hip abductors). 1. Lie on your side with your left / right leg in the top position. Lie so your head, shoulder, knee, and hip line up. You may bend your bottom knee to help you keep your balance. 2. Roll your hips slightly forward so your hips are stacked directly over each other and your left / right knee is facing forward. 3. Leading with your heel, lift your top leg 4-6 inches (10-15 cm). You should feel the muscles in your outer hip lifting. ? Do not let your foot drift forward. ? Do not let your knee  roll toward the ceiling. 4. Hold this position for __________ seconds. 5. Slowly return your leg to the starting position. 6. Let your muscles relax completely after each repetition. Repeat __________ times. Complete this exercise __________ times a day. Straight leg raises This exercise stretches the muscles that move your hips away from the front of the pelvis (hip extensors). 1. Lie on your abdomen on a firm surface. You can put a pillow under your hips if that is more comfortable. 2. Tense the muscles in your buttocks and lift your left / right leg about 4-6 inches (10-15 cm). Keep your knee straight as you lift your leg. 3. Hold this position for __________ seconds. 4. Slowly lower your leg to the starting position. 5. Let your leg relax completely after each repetition. Repeat __________ times. Complete this exercise __________ times a day. This information is not intended to replace advice given to you by your health care provider. Make sure you discuss any questions you have with your health care provider. Document Released: 01/07/2005 Document Revised: 12/14/2017 Document Reviewed: 12/14/2017 Elsevier Patient Education  2020 [employer]  

## 2019-02-21 ENCOUNTER — Ambulatory Visit: Payer: 59 | Admitting: Rheumatology

## 2019-02-22 LAB — HM DIABETES EYE EXAM

## 2019-03-30 ENCOUNTER — Other Ambulatory Visit: Payer: Self-pay | Admitting: Internal Medicine

## 2019-03-30 DIAGNOSIS — Z1231 Encounter for screening mammogram for malignant neoplasm of breast: Secondary | ICD-10-CM

## 2019-04-07 ENCOUNTER — Other Ambulatory Visit: Payer: Self-pay

## 2019-04-07 ENCOUNTER — Ambulatory Visit (INDEPENDENT_AMBULATORY_CARE_PROVIDER_SITE_OTHER): Payer: 59

## 2019-04-07 DIAGNOSIS — R0609 Other forms of dyspnea: Secondary | ICD-10-CM | POA: Diagnosis not present

## 2019-04-07 DIAGNOSIS — I340 Nonrheumatic mitral (valve) insufficiency: Secondary | ICD-10-CM | POA: Diagnosis not present

## 2019-04-14 ENCOUNTER — Telehealth: Payer: 59 | Admitting: Cardiology

## 2019-04-18 ENCOUNTER — Other Ambulatory Visit: Payer: Self-pay | Admitting: Rheumatology

## 2019-04-18 DIAGNOSIS — M359 Systemic involvement of connective tissue, unspecified: Secondary | ICD-10-CM

## 2019-04-18 NOTE — Telephone Encounter (Signed)
Last Visit: 12/06/18 Next Visit: 06/01/19 Labs: 08/29/18 CBC and CMP WNL PLQ Eye Exam 02/16/18 WNL   Patient advise  she is due to update labs and PLQ eye exam. Patient states she had her PLQ eye exam and will have them send results. Patient was exposed to COVID and was test on 04/13/19. Patient has rescheduled her follow up appointment to June 01, 2019.   Okay to refill PLQ and Colchicine?

## 2019-04-18 NOTE — Telephone Encounter (Signed)
Ok to refill 30 day supply.  She will require lab work ASAP.

## 2019-04-26 ENCOUNTER — Ambulatory Visit: Payer: 59 | Admitting: Rheumatology

## 2019-04-28 ENCOUNTER — Other Ambulatory Visit: Payer: Self-pay

## 2019-04-28 ENCOUNTER — Encounter: Payer: Self-pay | Admitting: Cardiology

## 2019-04-28 ENCOUNTER — Ambulatory Visit: Payer: 59 | Admitting: Cardiology

## 2019-04-28 VITALS — BP 113/79 | HR 82 | Temp 97.4°F | Ht 67.0 in | Wt 146.4 lb

## 2019-04-28 DIAGNOSIS — R0609 Other forms of dyspnea: Secondary | ICD-10-CM

## 2019-04-28 DIAGNOSIS — I340 Nonrheumatic mitral (valve) insufficiency: Secondary | ICD-10-CM

## 2019-04-28 DIAGNOSIS — R002 Palpitations: Secondary | ICD-10-CM

## 2019-04-28 DIAGNOSIS — M3501 Sicca syndrome with keratoconjunctivitis: Secondary | ICD-10-CM

## 2019-04-28 NOTE — Progress Notes (Signed)
Subjective:   Stefanie [name], female    DOB: 1960/04/20, 59 y.o.   MRN: [id number]  Shon Baton, MD:   HPI: Stefanie [name]  is a 59 y.o. female  with long-standing history of fibromyalgia, rheumatoid arthritis with positive ANA and rheumatoid factor, Sjogren's syndrome with keratoconjunctivitis sicca, degenerative joint disease and lumbar disc disease.  Patient has been evaluated for atypical chest pain and also dyspnea and has had normal Lexiscan nuclear stress test and echocardiogram except for mild to moderate MR without pulmonary hypertension in 2018. 30 day event monitor in June 2020 revealed one episode of sinus tachycardia at 149 bpm; otherwise, no arrhythmias.   She has had negative sleep study. She did have PFT's also performed that showed a reduced diffusing capacity, minimal airway obstruction and overinflation. Referral was previously placed to Dr. Rolla Etienne, but due to the pandemic, patient decided to hold off on this.  She is here on a 84-monthoffice visit and follow-up to discuss recent echocardiogram.  She is overall doing well.  She does state that she has had occasional episodes of palpitations and some dyspnea.  She has not been as active recently in view of the pandemic.    She has not been quite as active as she once was over the last few months.  She previously had improvement in symptoms with increased activity.  She is followed by Dr. DEstanislado Pandyfor management of autoimmune diseases.     Past Medical History:  Diagnosis Date  . Anemia   . Arthritis   . Asthma   . Diabetes insipidus ([place]   . Fibromyalgia   . Lupus ([place]   . Mitral valve prolapse   . SLE (systemic lupus erythematosus) ([facility] 12/06/2012  . Thyroid disease     Past Surgical History:  Procedure Laterality Date  . PITUITARY EXCISION    . TONSILLECTOMY      Family History  Problem Relation Age of Onset  . Diabetes Father   . Heart disease Father   . Hyperlipidemia Father   .  Hypertension Father   . Stroke Father   . Diabetes Sister   . Cancer Maternal Grandmother   . Diabetes Paternal Grandfather   . Heart disease Paternal Grandfather   . Hyperlipidemia Paternal Grandfather   . Hypertension Paternal Grandfather     Social History   Socioeconomic History  . Marital status: Married    Spouse name: Not on file  . Number of children: 2  . Years of education: Not on file  . Highest education level: Not on file  Occupational History  . Not on file  Tobacco Use  . Smoking status: Never Smoker  . Smokeless tobacco: Never Used  Substance and Sexual Activity  . Alcohol use: No  . Drug use: No  . Sexual activity: Not on file  Other Topics Concern  . Not on file  Social History Narrative  . Not on file   Social Determinants of Health   Financial Resource Strain:   . Difficulty of Paying Living Expenses: Not on file  Food Insecurity:   . Worried About RCharity fundraiserin the Last Year: Not on file  . Ran Out of Food in the Last Year: Not on file  Transportation Needs:   . Lack of Transportation (Medical): Not on file  . Lack of Transportation (Non-Medical): Not on file  Physical Activity:   . Days of Exercise per Week: Not on file  .  Minutes of Exercise per Session: Not on file  Stress:   . Feeling of Stress : Not on file  Social Connections:   . Frequency of Communication with Friends and Family: Not on file  . Frequency of Social Gatherings with Friends and Family: Not on file  . Attends Religious Services: Not on file  . Active Member of Clubs or Organizations: Not on file  . Attends Archivist Meetings: Not on file  . Marital Status: Not on file  Intimate Partner Violence:   . Fear of Current or Ex-Partner: Not on file  . Emotionally Abused: Not on file  . Physically Abused: Not on file  . Sexually Abused: Not on file    Current Meds  Medication Sig  . albuterol (PROVENTIL HFA;VENTOLIN HFA) 108 (90 BASE) MCG/ACT inhaler  Inhale 1 puff into the lungs every 6 (six) hours as needed for wheezing or shortness of breath.  . Ascorbic Acid (VITAMIN C) 1000 MG tablet Take 1,000 mg by mouth daily.  . Cholecalciferol (VITAMIN D PO) Take 1 capsule by mouth daily.  . Colchicine 0.6 MG CAPS TAKE 1 CAPSULE BY MOUTH EVERY DAY  . hydroxychloroquine (PLAQUENIL) 200 MG tablet TAKE 1 TABLET BY MOUTH IN THE MORNING AND 1/2 TABLET EVERY EVENING  . levothyroxine (SYNTHROID, LEVOTHROID) 125 MCG tablet Take 125 mcg by mouth at bedtime.   . rizatriptan (MAXALT) 10 MG tablet Take 10 mg by mouth daily as needed for migraine. May repeat in 2 hours if needed  . Turmeric 450 MG CAPS Take by mouth as needed.      Review of Systems  Constitution: Positive for malaise/fatigue (chronic ). Negative for decreased appetite, weight gain and weight loss.  Eyes: Negative for visual disturbance.  Cardiovascular: Positive for dyspnea on exertion (slight worsening) and palpitations (can happen at rest). Negative for chest pain, claudication, leg swelling, orthopnea and syncope.  Respiratory: Negative for hemoptysis and wheezing.   Endocrine: Negative for cold intolerance and heat intolerance.  Hematologic/Lymphatic: Does not bruise/bleed easily.  Skin: Negative for nail changes.  Musculoskeletal: Positive for joint pain. Negative for muscle weakness and myalgias.  Gastrointestinal: Negative for abdominal pain, change in bowel habit, nausea and vomiting.  Neurological: Negative for difficulty with concentration, dizziness, focal weakness and headaches.  Psychiatric/Behavioral: Negative for altered mental status and suicidal ideas.  All other systems reviewed and are negative.      Objective:     Vitals with BMI 04/28/2019 12/06/2018 10/11/2018  Height '5\' 7"'  '5\' 7"'  '5\' 6"'   Weight 146 lbs 6 oz 155 lbs 148 lbs  BMI 22.92 81.10 31.5  Systolic 945 859 -  Diastolic 79 77 -  Pulse 82 68 -     Blood pressure 113/79, pulse 82, temperature (!) 97.4 F  (36.3 C), height '5\' 7"'  (1.702 m), weight 146 lb 6.4 oz (66.4 kg), last menstrual period 04/09/2014, SpO2 100 %.  Cardiac studies:  Echocardiogram 04/07/2019:  Left ventricle cavity is normal in size and wall thickness. Normal global  wall motion. Normal LV systolic function with EF 61%. Normal diastolic  filling pattern.  Mild prolapse of the mitral valve leaflets. Moderate (Grade III) mitral  regurgitation.  Moderate to severe tricuspid regurgitation. Estimated pulmonary artery  systolic pressure is 27 mmHg.  30-day event monitor 6/17-7/16/2020: Normal sinus rhythm. One auto detected event occurred on day 30 at 1647 for sinus tachycardia at 149 bpm. No patient triggered events occurred. Fastest heart rate was 149 bpm. No A. fib or  SVT was noted.  Lexiscan sestamibi Stress test 2018: Nondiagnostic EKG.  Medium size defect of moderate intensity in the anterior wall suggestive breast attenuation, no evidence of ischemia, EF normal 64%.  Low risk study.  PFT 04/13/2018: PFTs show overinflation of lungs/air trapping. That may represent COPD in a smoker or asthma. She has Sjogren's and lung cysts. Sometimes Sjogrens can manifest itself in lung as this.  (Curb side consult with Dr. Vaughan Browner)  CTA of chest 2016:  No significant acute pulmonary embolus by CTA. Very small pericardial effusion. Right middle lobe nodular consolidative bronchovascular opacities with adjacent fine tree-in-bud opacities, infectious/inflammatory process such as pneumonia is favored. This correlates with the chest x-ray finding. Recommend short-term follow-up at 4 weeks following treatment with a repeat two view chest exam. 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  Recent Labs:  11/29/2017: Creatinine 0.8, EGFR 73/89, potassium 4.2, CMP normal.  CBC normal.  Ferritin low at 19.7.  TSH 4.28.  Free T4 normal at 1.0.  Vitamin D normal.  Positive ANA, rheumatoid factor,  positive Ro and La, positive anticardiolipin antibody -  follows Dr. Bo Merino. Negative anti-DS DNA  Physical Exam  Constitutional: She is oriented to person, place, and time. Vital signs are normal. She appears well-developed and well-nourished.  HENT:  Head: Normocephalic and atraumatic.  Cardiovascular: Normal rate, regular rhythm and intact distal pulses. Exam reveals a midsystolic click.  Murmur heard. High-pitched blowing holosystolic murmur is present with a grade of 2/6 at the apex. Pulmonary/Chest: Effort normal and breath sounds normal. No accessory muscle usage. No respiratory distress.  Abdominal: Soft. Bowel sounds are normal.  Musculoskeletal:        General: Normal range of motion.     Cervical back: Normal range of motion.  Neurological: She is alert and oriented to person, place, and time.  Skin: Skin is warm and dry.  Vitals reviewed.      Assessment & Recommendations:   Dyspnea on exertion - Plan: EKG 12-Lead, Ambulatory referral to Pulmonology  Palpitations  Moderate mitral regurgitation  Sjogren's syndrome with keratoconjunctivitis sicca ([place])  EKG 04/28/2019: Normal sinus rhythm at 76 bpm, normal axis, no evidence of ischemia. Low voltage complexes.  Plan: Stefanie [name] is a Caucasian female with long-standing history of fibromyalgia, rheumatoid arthritis with positive ANA and rheumatoid factor, kerato conjunctivitis sicca, degenerative joint disease and lumbar disc disease.  Patient presents for 80-monthfollow-up and to discuss recent echocardiogram results.  Her echocardiogram essentially unchanged compared to previous echo in 2018.  She does have moderate MR that will need continued surveillance.  No evidence of pulmonary hypertension.  She is complaining of worsening dyspnea on exertion, that is likely related to her inactivity over the last few months as she previously had improvement in her symptoms with increased activity.  I have  recommended that she have pulmonary evaluation given her autoimmune disorders and previously mildly abnormal PFTs.  She is now willing to go for her appointment and will place referral.  She is also continue to have some episodes of palpitations.  I have discussed her previously obtained event monitor that showed one episode of sinus tachycardia at 149 bpm.  No significant arrhythmias.  I have encouraged her to try B6 and B12 vitamins to see if she will have improvement in her palpitations.  Her symptoms are also fairly suggestive of PVCs.  Overall, patient is stable from a cardiac standpoint.  I will plan to see her  back in 6 months, but encouraged her to contact me sooner if needed.   Jeri Lager, MSN, APRN, FNP-C [facility], [place] Office: [phone] Fax: [phone]

## 2019-05-05 ENCOUNTER — Other Ambulatory Visit: Payer: Self-pay

## 2019-05-05 ENCOUNTER — Ambulatory Visit
Admission: RE | Admit: 2019-05-05 | Discharge: 2019-05-05 | Disposition: A | Discharge status: Home / Self Care | Payer: 59 | Source: Ambulatory Visit | Attending: Internal Medicine | Admitting: Internal Medicine

## 2019-05-05 DIAGNOSIS — Z1231 Encounter for screening mammogram for malignant neoplasm of breast: Secondary | ICD-10-CM

## 2019-05-19 ENCOUNTER — Other Ambulatory Visit: Payer: Self-pay | Admitting: Rheumatology

## 2019-05-19 DIAGNOSIS — M359 Systemic involvement of connective tissue, unspecified: Secondary | ICD-10-CM

## 2019-05-19 NOTE — Telephone Encounter (Signed)
Last Visit: 12/06/18 Next Visit: 06/01/19 Labs: 08/29/18 CBC and CMP WNL PLQ Eye Exam 02/16/18 WNL   Attempted to contact the patient and left message for patient to advise she is due to update labs and her PLQ eye exam.   Okay to refill 30 day supply PLQ?

## 2019-05-19 NOTE — Telephone Encounter (Signed)
ok 

## 2019-05-22 ENCOUNTER — Telehealth: Payer: Self-pay | Admitting: Rheumatology

## 2019-05-22 NOTE — Telephone Encounter (Signed)
Patient left a voicemail on Friday, 05/19/19 at 5:15 pm stating she had her field of vision exam on 03/05/19.  Patient states she will call the eye doctor again to see if they can fax the results to the office.  Patient is requesting a return call to let her know if you received it.  Please cal her at her work [phone]

## 2019-05-22 NOTE — Telephone Encounter (Signed)
Patient advised we have not received her PLQ eye exam. Patient will call the eye doctor and have them send it. Patient provided fax number.

## 2019-05-24 NOTE — Progress Notes (Signed)
Office Visit Note  Patient: Stefanie [name]             Date of Birth: Oct 20, 1960           MRN: [id number]             PCP: Creola Corn, MD Referring: Creola Corn, MD Visit Date: 06/01/2019 Occupation: @GUAROCC @  Subjective:  Pain all over the body.   History of Present Illness: Stefanie [name] is a 59 y.o. female with history of autoimmune disease, osteoarthritis and fibromyalgia.  She states she has been under a lot of stress at work due to the pandemic.  She has been experiencing pain all over her body.  She believes her fibromyalgia is flaring.  She has been having lot of discomfort in her cervical region.  She has tried some exercises which did not relieve her symptoms.  She has been also having discomfort in her right forearm.  She has noticed decreased grip strength.  She has noticed some swelling in her hands and her knees.  Activities of Daily Living:  Patient reports morning stiffness for 24 hours.   Patient Reports nocturnal pain.  Difficulty dressing/grooming: Denies Difficulty climbing stairs: Reports Difficulty getting out of chair: Reports Difficulty using hands for taps, buttons, cutlery, and/or writing: Reports  Review of Systems  Constitutional: Positive for fatigue. Negative for night sweats, weight gain and weight loss.  HENT: Positive for mouth dryness and nose dryness. Negative for mouth sores, trouble swallowing and trouble swallowing.   Eyes: Positive for dryness. Negative for pain, redness, itching and visual disturbance.  Respiratory: Negative for cough, shortness of breath and difficulty breathing.        Cardiology referred patient to pulmonology   Cardiovascular: Negative for chest pain, palpitations, hypertension, irregular heartbeat and swelling in legs/feet.  Gastrointestinal: Negative for blood in stool, constipation and diarrhea.  Endocrine: Negative for increased urination.  Genitourinary: Negative for difficulty urinating and vaginal  dryness.  Musculoskeletal: Positive for arthralgias, joint pain, joint swelling and morning stiffness. Negative for myalgias, muscle weakness, muscle tenderness and myalgias.  Skin: Negative for color change, rash, hair loss, redness, skin tightness, ulcers and sensitivity to sunlight.  Allergic/Immunologic: Negative for susceptible to infections.  Neurological: Negative for dizziness, numbness, headaches, memory loss, night sweats and weakness.  Hematological: Negative for bruising/bleeding tendency and swollen glands.  Psychiatric/Behavioral: Negative for depressed mood, confusion and sleep disturbance. The patient is not nervous/anxious.     PMFS History:  Patient Active Problem List   Diagnosis Date Noted  . Gasping for breath 05/10/2018  . Bursitis, ischial, right 10/08/2016  . 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 Fibromyalgia 10/08/2016  . Other fatigue 10/08/2016  . DDD (degenerative disc disease), lumbar/ and scoliosis 10/08/2016  . Facial droop   . 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 Prolonged QT interval 07/28/2016  . Primary osteoarthritis of both hands 05/11/2016  . Primary osteoarthritis of both knees 05/11/2016  . History of hypothyroidism 05/11/2016  . History of migraine 05/11/2016  . 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 ANA positive 04/29/2016  . Rheumatoid factor positive 04/29/2016  . Pseudogout 04/29/2016  . Chondrocalcinosis 04/29/2016  . High risk medication use 04/29/2016  . Sjogren's syndrome with keratoconjunctivitis sicca (HCC) 04/29/2016  . History of neutropenia 04/29/2016  . 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  . Hyponatremia 12/12/2013  . Fever 12/13/2012  . Pleuritic chest pain 12/06/2012  .  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 Anxiety 12/06/2012  . ANEMIA-IRON DEFICIENCY 01/08/2010  . GASTRIC DILATION, ACUTE  01/08/2010  . NAUSEA ALONE 01/08/2010  . ABDOMINAL PAIN, LEFT UPPER QUADRANT 01/08/2010    Past Medical History:  Diagnosis Date  . Anemia   . Arthritis   . Asthma   . Diabetes insipidus (HCC)   . Fibromyalgia   . Lupus (HCC)   . Mitral valve prolapse   . SLE (systemic lupus erythematosus) (HCC) 12/06/2012  . Thyroid disease     Family History  Problem Relation Age of Onset  . Diabetes Father   . Heart disease Father   . Hyperlipidemia Father   . Hypertension Father   . Stroke Father   . Diabetes Sister   . Kidney disease Sister        transplant  . Heart disease Sister        open heart surgery   . Pancreatic disease Sister        transplant   . Cancer Maternal Grandmother   . Diabetes Paternal Grandfather   . Heart disease Paternal Grandfather   . Hyperlipidemia Paternal Grandfather   . Hypertension Paternal Grandfather   . Healthy Daughter   . Healthy Son    Past Surgical History:  Procedure Laterality Date  . PITUITARY EXCISION    . TONSILLECTOMY     Social History   Social History Narrative  . Not on file    There is no immunization history on file for this patient.   Objective: Vital Signs: BP 124/87 (BP Location: Left Arm, Patient Position: Sitting, Cuff Size: Normal)   Pulse 77   Resp 14   Ht 5\' 6"  (1.676 m)   Wt 144 lb 9.6 oz (65.6 kg)   LMP 04/09/2014 (Approximate)   BMI 23.34 kg/m    Physical Exam Vitals and nursing note reviewed.  Constitutional:      Appearance: She is well-developed.  HENT:     Head: Normocephalic and atraumatic.  Eyes:     Conjunctiva/sclera: Conjunctivae normal.  Cardiovascular:     Rate and Rhythm: Normal rate and regular rhythm.     Heart sounds: Normal heart sounds.  Pulmonary:     Effort: Pulmonary effort is normal.     Breath sounds: Normal breath sounds.  Abdominal:     General: Bowel sounds are normal.     Palpations: Abdomen is soft.  Musculoskeletal:     Cervical back: Normal range of motion.    Lymphadenopathy:     Cervical: No cervical adenopathy.  Skin:    General: Skin is warm and dry.     Capillary Refill: Capillary refill takes less than 2 seconds.  Neurological:     Mental Status: She is alert and oriented to person, place, and time.  Psychiatric:        Behavior: Behavior normal.      Musculoskeletal Exam: C-spine thoracic and lumbar spine with good range of motion.  She has some trapezius spasm.  Shoulder joints, elbow joints, wrist joints, MCPs PIPs and DIPs with good range of motion.  She has bilateral CMC, PIP and DIP thickening consistent with osteoarthritis.  Hip joints, knee joints in good range of motion.  She has some pedal edema.  No MTP tenderness was noted.  CDAI Exam: CDAI Score: -- Patient Global: --; Provider Global: -- Swollen: --; Tender: -- Joint Exam 06/01/2019   No joint exam has been documented for this visit   There is  currently no information documented on the homunculus. Go to the Rheumatology activity and complete the homunculus joint exam.  Investigation: No additional findings.  Imaging: MM 3D SCREEN BREAST BILATERAL  Result Date: 05/08/2019 CLINICAL DATA:  Screening. EXAM: DIGITAL SCREENING BILATERAL MAMMOGRAM WITH TOMO AND CAD COMPARISON:  Previous exam(s). ACR Breast Density Category b: There are scattered areas of fibroglandular density. FINDINGS: There are no findings suspicious for malignancy. Images were processed with CAD. IMPRESSION: No mammographic evidence of malignancy. A result letter of this screening mammogram will be mailed directly to the patient. RECOMMENDATION: Screening mammogram in one year. (Code:SM-B-01Y) BI-RADS CATEGORY  1: Negative. Electronically Signed   By: Sande Brothers M.D.   On: 05/08/2019 12:27    Recent Labs: Lab Results  Component Value Date   WBC 5.2 08/29/2018   HGB 13.3 08/29/2018   PLT 224 08/29/2018   NA 138 08/29/2018   K 3.9 08/29/2018   CL 101 08/29/2018   CO2 27 08/29/2018   GLUCOSE 90  08/29/2018   BUN 13 08/29/2018   CREATININE 0.75 08/29/2018   BILITOT 0.6 08/29/2018   ALKPHOS 97 07/30/2016   AST 29 08/29/2018   ALT 18 08/29/2018   PROT 7.6 08/29/2018   ALBUMIN 3.6 07/30/2016   CALCIUM 9.8 08/29/2018   GFRAA 102 08/29/2018    Speciality Comments: PLQ Eye Exam 02/16/18 WNL @ [facility] follow up in 1 year  Procedures:  No procedures performed Allergies: Iron sucrose, Morphine, Venofer [ferric oxide], Azithromycin, and Other   Assessment / Plan:     Visit Diagnoses: Autoimmune disease (HCC) positive ANA, positive Ro, positive La, anticardiolipin antibody and positive rheumatoid factor -patient reports having increased fatigue and generalized pain.  No synovitis was noted on examination today.  I will obtain autoimmune labs.  Plan: Anti-DNA antibody, double-stranded, C3 and C4, Sedimentation rate, Urinalysis, Routine w reflex microscopic  High risk medication use - Plaquenil 200 mg 1 tablet in the morning and 1/2 tablet in the evening. Eye Exam 02/16/18  -she needs repeat eye examination.  The form was given to the patient.  We will check labs today.  Plan: CBC with Differential/Platelet, COMPLETE METABOLIC PANEL WITH GFR  Sjogren's syndrome with keratoconjunctivitis sicca (HCC)-she continues to have sicca symptoms.  Over-the-counter products were discussed.  Pain in right ankle and joints of right foot - MRI 12/23/2017 MRI of right ankle revealed insertional achilles tendinosis with retrocalcaneal bursitis.  The pain is improved.  Primary osteoarthritis of both hands-she has ongoing discomfort in her hands.  No synovitis was noted.  Primary osteoarthritis of both knees-she has chronic discomfort.  Chondromalacia patellae, right knee  Cervical spondylosis without myelopathy-she has been having increased cervical muscle spasms.  She had trapezius tightness on examination today.  I advised exercise.  Chondrocalcinosis - colchicine 0.6 mg 1 capsule by  mouth daily.  Fibromyalgia-she is experiencing a flare with increased pain and discomfort.  Other medical problems are listed as follows:  Other fatigue  History of migraine  History of hypothyroidism  History of diabetes insipidus  History of anxiety  Prolonged QT interval  Orders: Orders Placed This Encounter  Procedures  . CBC with Differential/Platelet  . COMPLETE METABOLIC PANEL WITH GFR  . Anti-DNA antibody, double-stranded  . C3 and C4  . Sedimentation rate  . Urinalysis, Routine w reflex microscopic   No orders of the defined types were placed in this encounter.     Follow-Up Instructions: Return in about 5 months (around 11/01/2019) for Autoimmune disease,  FMS.   Bo Merino, MD  Note - This record has been created using Editor, commissioning.  Chart creation errors have been sought, but may not always  have been located. Such creation errors do not reflect on  the standard of medical care.

## 2019-06-01 ENCOUNTER — Other Ambulatory Visit: Payer: Self-pay

## 2019-06-01 ENCOUNTER — Ambulatory Visit: Payer: 59 | Admitting: Rheumatology

## 2019-06-01 ENCOUNTER — Encounter: Payer: Self-pay | Admitting: Rheumatology

## 2019-06-01 VITALS — BP 124/87 | HR 77 | Resp 14 | Ht 66.0 in | Wt 144.6 lb

## 2019-06-01 DIAGNOSIS — R5383 Other fatigue: Secondary | ICD-10-CM

## 2019-06-01 DIAGNOSIS — M25571 Pain in right ankle and joints of right foot: Secondary | ICD-10-CM

## 2019-06-01 DIAGNOSIS — Z8659 Personal history of other mental and behavioral disorders: Secondary | ICD-10-CM

## 2019-06-01 DIAGNOSIS — M47812 Spondylosis without myelopathy or radiculopathy, cervical region: Secondary | ICD-10-CM

## 2019-06-01 DIAGNOSIS — M359 Systemic involvement of connective tissue, unspecified: Secondary | ICD-10-CM

## 2019-06-01 DIAGNOSIS — M2241 Chondromalacia patellae, right knee: Secondary | ICD-10-CM

## 2019-06-01 DIAGNOSIS — Z8639 Personal history of other endocrine, nutritional and metabolic disease: Secondary | ICD-10-CM

## 2019-06-01 DIAGNOSIS — M19041 Primary osteoarthritis, right hand: Secondary | ICD-10-CM

## 2019-06-01 DIAGNOSIS — M3501 Sicca syndrome with keratoconjunctivitis: Secondary | ICD-10-CM

## 2019-06-01 DIAGNOSIS — R9431 Abnormal electrocardiogram [ECG] [EKG]: Secondary | ICD-10-CM

## 2019-06-01 DIAGNOSIS — M19042 Primary osteoarthritis, left hand: Secondary | ICD-10-CM

## 2019-06-01 DIAGNOSIS — M1120 Other chondrocalcinosis, unspecified site: Secondary | ICD-10-CM

## 2019-06-01 DIAGNOSIS — Z8669 Personal history of other diseases of the nervous system and sense organs: Secondary | ICD-10-CM

## 2019-06-01 DIAGNOSIS — Z79899 Other long term (current) drug therapy: Secondary | ICD-10-CM

## 2019-06-01 DIAGNOSIS — M797 Fibromyalgia: Secondary | ICD-10-CM

## 2019-06-01 DIAGNOSIS — M170 Bilateral primary osteoarthritis of knee: Secondary | ICD-10-CM

## 2019-06-02 LAB — COMPLETE METABOLIC PANEL WITH GFR
AG Ratio: 1.6 (calc) (ref 1.0–2.5)
ALT: 25 U/L (ref 6–29)
AST: 29 U/L (ref 10–35)
Albumin: 4.6 g/dL (ref 3.6–5.1)
Alkaline phosphatase (APISO): 88 U/L (ref 37–153)
BUN: 15 mg/dL (ref 7–25)
CO2: 28 mmol/L (ref 20–32)
Calcium: 9.9 mg/dL (ref 8.6–10.4)
Chloride: 102 mmol/L (ref 98–110)
Creat: 0.7 mg/dL (ref 0.50–1.05)
GFR, Est African American: 110 mL/min/{1.73_m2} (ref >=60)
GFR, Est Non African American: 95 mL/min/{1.73_m2} (ref >=60)
Globulin: 2.9 g/dL (calc) (ref 1.9–3.7)
Glucose, Bld: 90 mg/dL (ref 65–99)
Potassium: 3.9 mmol/L (ref 3.5–5.3)
Sodium: 138 mmol/L (ref 135–146)
Total Bilirubin: 0.6 mg/dL (ref 0.2–1.2)
Total Protein: 7.5 g/dL (ref 6.1–8.1)

## 2019-06-02 LAB — URINALYSIS, ROUTINE W REFLEX MICROSCOPIC
Bacteria, UA: NONE SEEN /HPF
Bilirubin Urine: NEGATIVE
Glucose, UA: NEGATIVE
Hgb urine dipstick: NEGATIVE
Hyaline Cast: NONE SEEN /LPF
Ketones, ur: NEGATIVE
Nitrite: NEGATIVE
Protein, ur: NEGATIVE
RBC / HPF: NONE SEEN /HPF (ref 0–2)
Specific Gravity, Urine: 1.009 (ref 1.001–1.03)
Squamous Epithelial / HPF: NONE SEEN /HPF (ref <=5)
WBC, UA: NONE SEEN /HPF (ref 0–5)
pH: 6.5 (ref 5.0–8.0)

## 2019-06-02 LAB — CBC WITH DIFFERENTIAL/PLATELET
Absolute Monocytes: 466 cells/uL (ref 200–950)
Basophils Absolute: 42 cells/uL (ref 0–200)
Basophils Relative: 1 %
Eosinophils Absolute: 101 cells/uL (ref 15–500)
Eosinophils Relative: 2.4 %
HCT: 41.5 % (ref 35.0–45.0)
Hemoglobin: 13.9 g/dL (ref 11.7–15.5)
Lymphs Abs: 1453 cells/uL (ref 850–3900)
MCH: 29 pg (ref 27.0–33.0)
MCHC: 33.5 g/dL (ref 32.0–36.0)
MCV: 86.6 fL (ref 80.0–100.0)
MPV: 11.4 fL (ref 7.5–12.5)
Monocytes Relative: 11.1 %
Neutro Abs: 2138 cells/uL (ref 1500–7800)
Neutrophils Relative %: 50.9 %
Platelets: 247 10*3/uL (ref 140–400)
RBC: 4.79 10*6/uL (ref 3.80–5.10)
RDW: 12.1 % (ref 11.0–15.0)
Total Lymphocyte: 34.6 %
WBC: 4.2 10*3/uL (ref 3.8–10.8)

## 2019-06-02 LAB — ANTI-DNA ANTIBODY, DOUBLE-STRANDED: ds DNA Ab: 1 IU/mL

## 2019-06-02 LAB — C3 AND C4
C3 Complement: 126 mg/dL (ref 83–193)
C4 Complement: 30 mg/dL (ref 15–57)

## 2019-06-02 LAB — SEDIMENTATION RATE: Sed Rate: 28 mm/h (ref 0–30)

## 2019-06-26 ENCOUNTER — Other Ambulatory Visit: Payer: Self-pay | Admitting: Rheumatology

## 2019-06-27 NOTE — Telephone Encounter (Signed)
Last Visit: 06/01/2019 Next Visit: 11/09/2019 Labs: 06/01/2019 CBC and CMP WNL.   Okay to refill per Dr. Corliss Skains.

## 2019-06-30 ENCOUNTER — Institutional Professional Consult (permissible substitution): Payer: 59 | Admitting: Pulmonary Disease

## 2019-07-18 ENCOUNTER — Ambulatory Visit: Payer: 59 | Admitting: Pulmonary Disease

## 2019-07-18 ENCOUNTER — Other Ambulatory Visit: Payer: Self-pay

## 2019-07-18 ENCOUNTER — Encounter: Payer: Self-pay | Admitting: Pulmonary Disease

## 2019-07-18 VITALS — BP 122/86 | HR 92 | Temp 97.5°F | Ht 66.0 in | Wt 144.0 lb

## 2019-07-18 DIAGNOSIS — R0609 Other forms of dyspnea: Secondary | ICD-10-CM

## 2019-07-18 DIAGNOSIS — R0600 Dyspnea, unspecified: Secondary | ICD-10-CM

## 2019-07-18 NOTE — Progress Notes (Signed)
Stefanie [name]    [id number]    07-20-1960  Primary Care Physician:Russo, Jonny Ruiz, MD  Referring Physician: Toniann Fail, NP [address] [place],  [place] [address]  Chief complaint: Consult for dyspnea  HPI: 59 year old with history of asthma, palpitations, Sjogrens syndrome Sent in for evaluation of chronic dyspnea on exertion for the past few years associated with chest tightness, occasional wheezing She is on albuterol inhaler for asthma which she uses very seldom Also has seasonal allergies for which she uses over-the-counter antihistamines.  Follows with Dr. Corliss Skains, rheumatology with positive ANA, Ro, La, Cardiolipin and rheumatoid factor.  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  No mold, hot tub, Jacuzzi.  No down pillows or comforter Smoking history: Never smoker Travel history: No significant travel history Relevant family history: No significant family history of lung disease  Outpatient Encounter Medications as of 07/18/2019  Medication Sig  . albuterol (PROVENTIL HFA;VENTOLIN HFA) 108 (90 BASE) MCG/ACT inhaler Inhale 1 puff into the lungs every 6 (six) hours as needed for wheezing or shortness of breath.  . Ascorbic Acid (VITAMIN C) 1000 MG tablet Take 1,000 mg by mouth daily.  . Colchicine 0.6 MG CAPS TAKE 1 CAPSULE BY MOUTH EVERY DAY  . hydroxychloroquine (PLAQUENIL) 200 MG tablet TAKE 1 TABLET BY MOUTH IN THE MORNING AND 1/2 TABLET EVERY EVENING  . levothyroxine (SYNTHROID, LEVOTHROID) 125 MCG tablet Take 125 mcg by mouth at bedtime.   . rizatriptan (MAXALT) 10 MG tablet Take 10 mg by mouth daily as needed for migraine. May repeat in 2 hours if needed  . Turmeric 450 MG CAPS Take by mouth as needed.   . [DISCONTINUED] Cholecalciferol (VITAMIN D PO) Take 1 capsule by mouth daily.   No facility-administered  encounter medications on file as of 07/18/2019.    Allergies as of 07/18/2019 - Review Complete 07/18/2019  Allergen Reaction Noted  . Iron sucrose Rash 04/29/2011  . Morphine  04/29/2011  . Venofer [ferric oxide] Palpitations 12/07/2012  . Azithromycin Other (See Comments) 01/08/2010  . Other  04/29/2011    Past Medical History:  Diagnosis Date  . Anemia   . Arthritis   . Asthma   . Diabetes insipidus (HCC)   . Fibromyalgia   . Lupus (HCC)   . Mitral valve prolapse   . SLE (systemic lupus erythematosus) (HCC) 12/06/2012  . Thyroid disease     Past Surgical History:  Procedure Laterality Date  . PITUITARY EXCISION    . TONSILLECTOMY      Family History  Problem Relation Age of Onset  . Diabetes Father   . Heart disease Father   . Hyperlipidemia Father   . Hypertension Father   . Stroke Father   . Diabetes Sister   . Kidney disease Sister        transplant  . Heart disease Sister        open heart surgery   . Pancreatic disease Sister        transplant   . Cancer Maternal Grandmother   . Diabetes Paternal Grandfather   . Heart disease Paternal Grandfather   . Hyperlipidemia Paternal Grandfather   . Hypertension Paternal Grandfather   . Healthy Daughter   . Healthy Son     Social History   Socioeconomic History  . Marital status: Married    Spouse name: Not on  file  . Number of children: 2  . Years of education: Not on file  . Highest education level: Not on file  Occupational History  . Not on file  Tobacco Use  . Smoking status: Never Smoker  . Smokeless tobacco: Never Used  Substance and Sexual Activity  . Alcohol use: No  . Drug use: No  . Sexual activity: Not on file  Other Topics Concern  . Not on file  Social History Narrative  . Not on file   Social Determinants of Health   Financial Resource Strain:   . Difficulty of Paying Living Expenses:   Food Insecurity:   . Worried About Charity fundraiser in the Last Year:   . Academic librarian in the Last Year:   Transportation Needs:   . Film/video editor (Medical):   [name] Lack of Transportation (Non-Medical):   Physical Activity:   . Days of Exercise per Week:   . Minutes of Exercise per Session:   Stress:   . Feeling of Stress :   Social Connections:   . Frequency of Communication with Friends and Family:   . Frequency of Social Gatherings with Friends and Family:   . Attends Religious Services:   . Active Member of Clubs or Organizations:   . Attends Archivist Meetings:   [name] Marital Status:   Intimate Partner Violence:   . Fear of Current or Ex-Partner:   . Emotionally Abused:   [name] Physically Abused:   . Sexually Abused:     Review of systems: Review of Systems  Constitutional: Negative for fever and chills.  HENT: Negative.   Eyes: Negative for blurred vision.  Respiratory: as per HPI  Cardiovascular: Negative for chest pain and palpitations.  Gastrointestinal: Negative for vomiting, diarrhea, blood per rectum. Genitourinary: Negative for dysuria, urgency, frequency and hematuria.  Musculoskeletal: Negative for myalgias, back pain and joint pain.  Skin: Negative for itching and rash.  Neurological: Negative for dizziness, tremors, focal weakness, seizures and loss of consciousness.  Endo/Heme/Allergies: Negative for environmental allergies.  Psychiatric/Behavioral: Negative for depression, suicidal ideas and hallucinations.  All other systems reviewed and are negative.  Physical Exam: Blood pressure 122/86, pulse 92, temperature (!) 97.5 F (36.4 C), temperature source Temporal, height 5\' 6"  (1.676 m), weight 144 lb (65.3 kg), last menstrual period 04/09/2014, SpO2 97 %. Gen:      No acute distress HEENT:  EOMI, sclera anicteric Neck:     No masses; no thyromegaly Lungs:    Clear to auscultation bilaterally; normal respiratory effort CV:         Regular rate and rhythm; no murmurs Abd:      + bowel sounds; soft, non-tender; no palpable  masses, no distension Ext:    No edema; adequate peripheral perfusion Skin:      Warm and dry; no rash Neuro: alert and oriented x 3 Psych: normal mood and affect  Data Reviewed: Imaging: CTA 08/16/2014-mild right middle lobe bronchovascular opacities with tree-in-bud, scattered lung cysts. I have reviewed the images personally.  PFTs: 04/14/2018 FVC 3.59 [99%], FEV1 2.88 [102%], F/F 80, TLC 8.09 [151%], DLCO 17.13 [78%] Overinflation, minimal reduction in diffusion capacity.  Labs: CBC 06/01/2019-WBC 4.2, eos 2.4%, absolute eosinophil count 101  Assessment:  Evaluation for dyspnea She has history of asthma but has mild symptoms does not require controller medication and no peripheral eosinophilia Given history of Sjogren's syndrome she will need evaluation for ILD with high-res CT  Previous  CT in 2016 reviewed with some groundglass opacities and scattered cysts which may be lymphocytic interstitial pneumonia which can be seen in Sjogren's syndrome.  Plan/Recommendations: High-resolution CT  Chilton Greathouse MD [facility] 07/18/2019, 10:39 AM  CC: Toniann Fail, *

## 2019-07-18 NOTE — Patient Instructions (Signed)
We will schedule you for high-resolution CT for better evaluation of your lungs given his symptoms of dyspnea in the setting of autoimmune disease Follow-up in 1 to 2 months for review.

## 2019-07-22 ENCOUNTER — Other Ambulatory Visit: Payer: Self-pay | Admitting: Rheumatology

## 2019-07-24 NOTE — Telephone Encounter (Signed)
Last Visit: 06/01/2019 °Next Visit: 11/09/2019 °Labs: 06/01/2019 CBC and CMP WNL.   °  °Current Dose per office note on 06/01/2019: colchicine 0.6 mg 1 capsule by mouth daily °  °Okay to refill per Dr. Deveshwar  °

## 2019-08-01 ENCOUNTER — Other Ambulatory Visit: Payer: Self-pay

## 2019-08-01 ENCOUNTER — Ambulatory Visit (INDEPENDENT_AMBULATORY_CARE_PROVIDER_SITE_OTHER)
Admission: RE | Admit: 2019-08-01 | Discharge: 2019-08-01 | Disposition: A | Discharge status: Home / Self Care | Payer: 59 | Source: Ambulatory Visit | Attending: Pulmonary Disease | Admitting: Pulmonary Disease

## 2019-08-01 DIAGNOSIS — R0600 Dyspnea, unspecified: Secondary | ICD-10-CM | POA: Diagnosis not present

## 2019-08-01 DIAGNOSIS — R0609 Other forms of dyspnea: Secondary | ICD-10-CM

## 2019-08-10 NOTE — Progress Notes (Signed)
LMTCB

## 2019-08-11 NOTE — Progress Notes (Signed)
Left VM for patient to return call to get her CT results.

## 2019-08-14 ENCOUNTER — Telehealth: Payer: Self-pay | Admitting: Pulmonary Disease

## 2019-08-14 NOTE — Telephone Encounter (Signed)
Advised pt of results. Pt understood and nothing further is needed.   Appt made for PFT and OV 10/06/2019 at 2:00-3:00 Covid test 10/03/2019 at 2:30 since pt is not vaccinated.   Chilton Greathouse, MD  08/09/2019 4:31 AM EDT    Please let patient know that CT shows mild inflammation in lungs that could be coming from her Sjogren syndrome. Order PFTs and follow up in clinic with me to review options for treatment

## 2019-08-19 ENCOUNTER — Other Ambulatory Visit: Payer: Self-pay | Admitting: Rheumatology

## 2019-08-20 IMAGING — MG DIGITAL SCREENING BILATERAL MAMMOGRAM WITH TOMO AND CAD
8 series · 9 of 24 positions shown · non-contrast
Comparison: Previous exam(s).

CLINICAL DATA: Screening.

EXAM:
DIGITAL SCREENING BILATERAL MAMMOGRAM WITH TOMO AND CAD

[L MLO synth-2D]
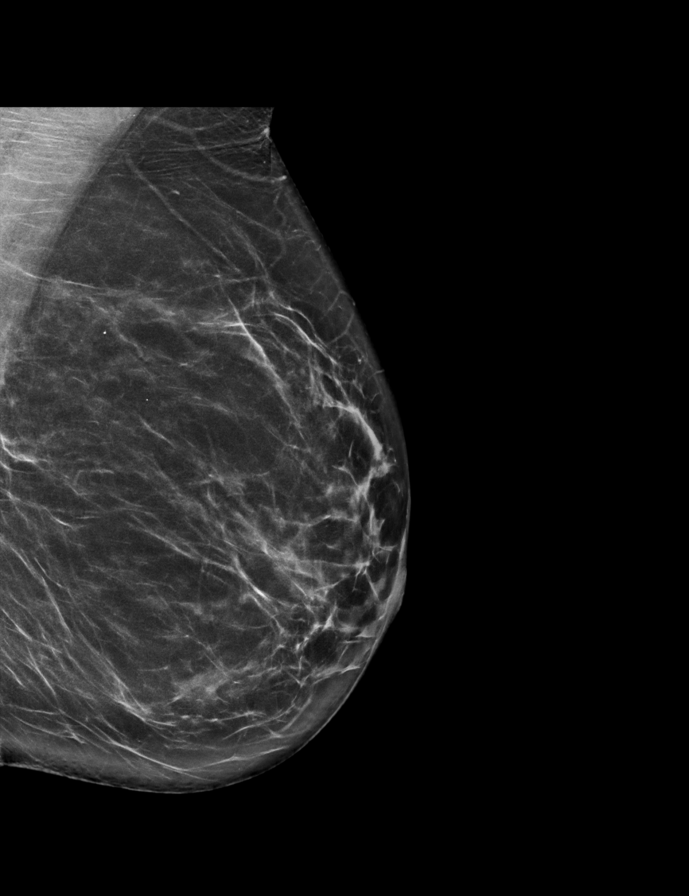

[R CC synth-2D]
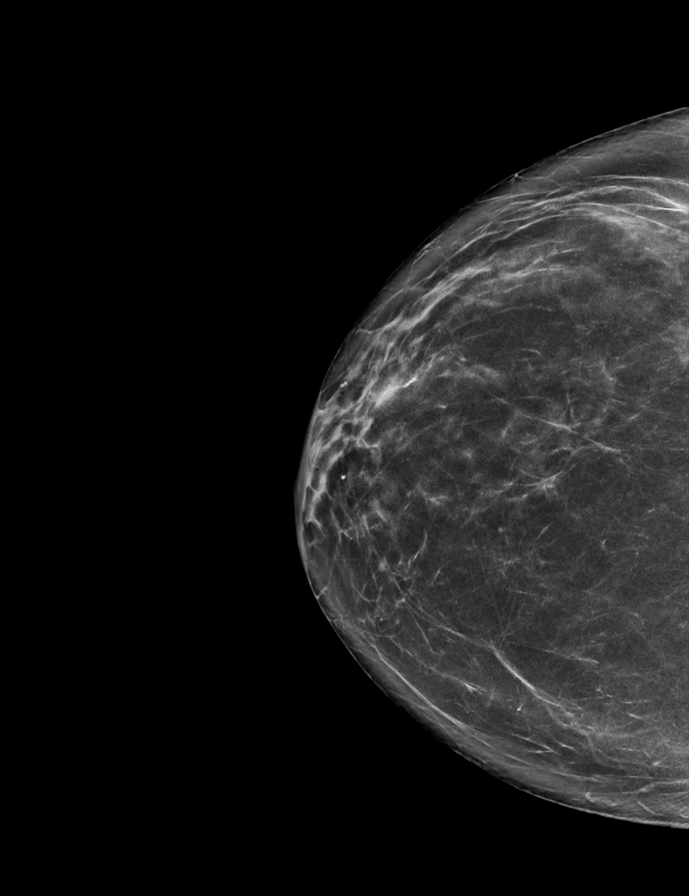

[R MLO synth-2D]
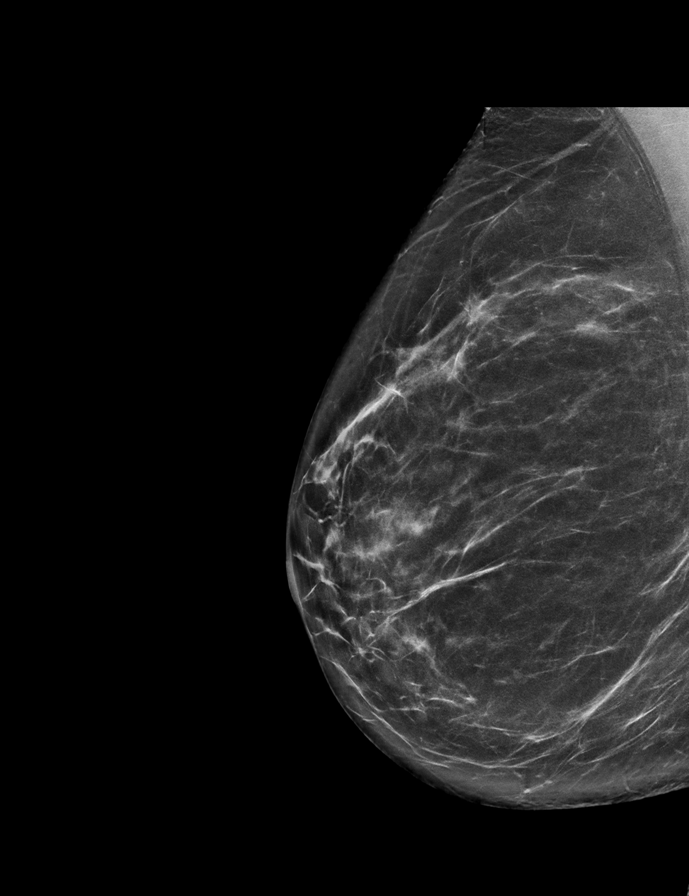

[L CC synth-2D]
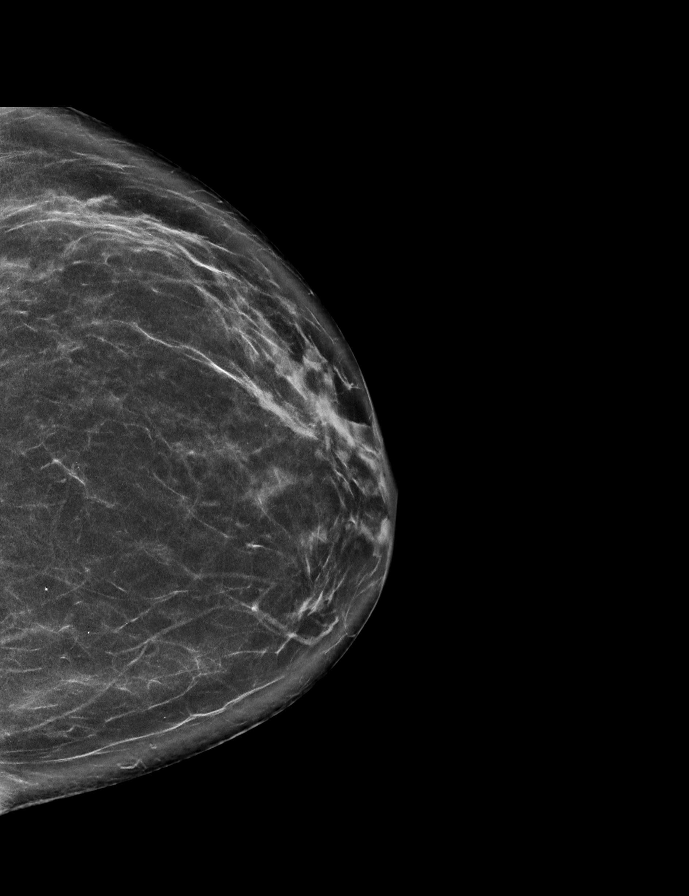

[R MLO tomo · 2 of 72 frames shown]
[frame 24/72]
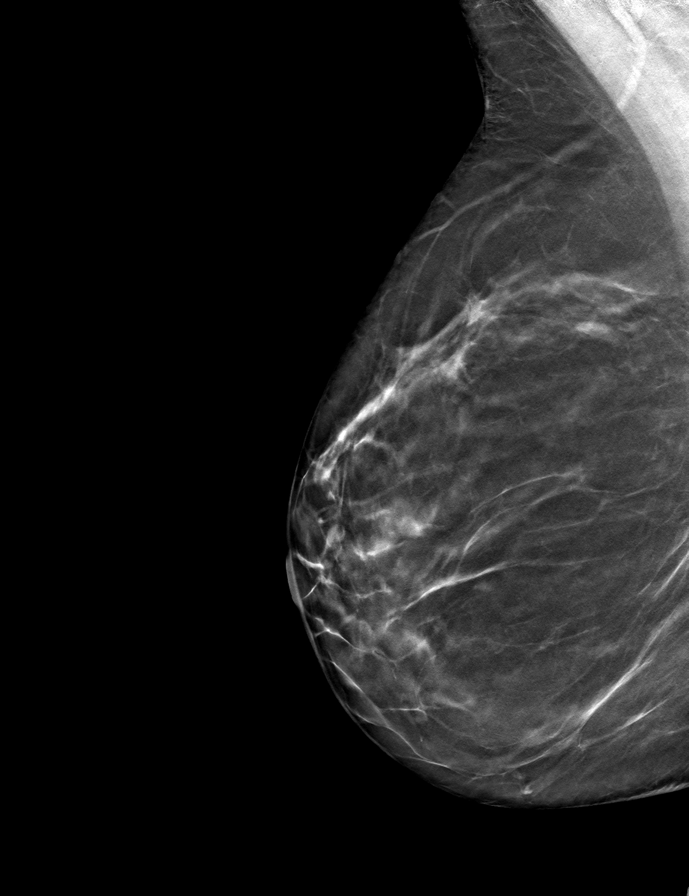
[frame 37/72]
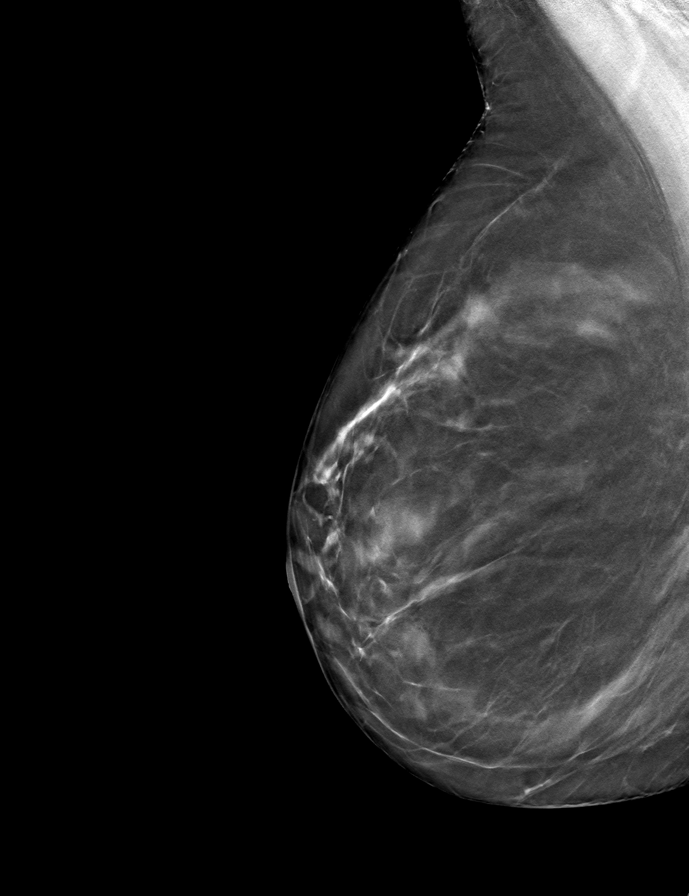

[L MLO tomo · tomo slice 37/74.0]
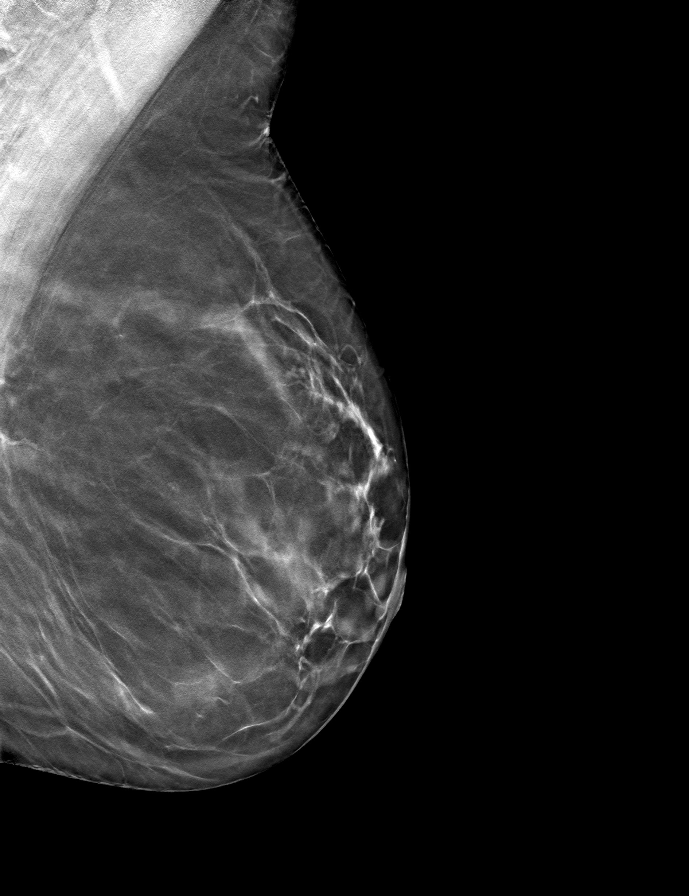

[R CC tomo · tomo slice 35/69.0]
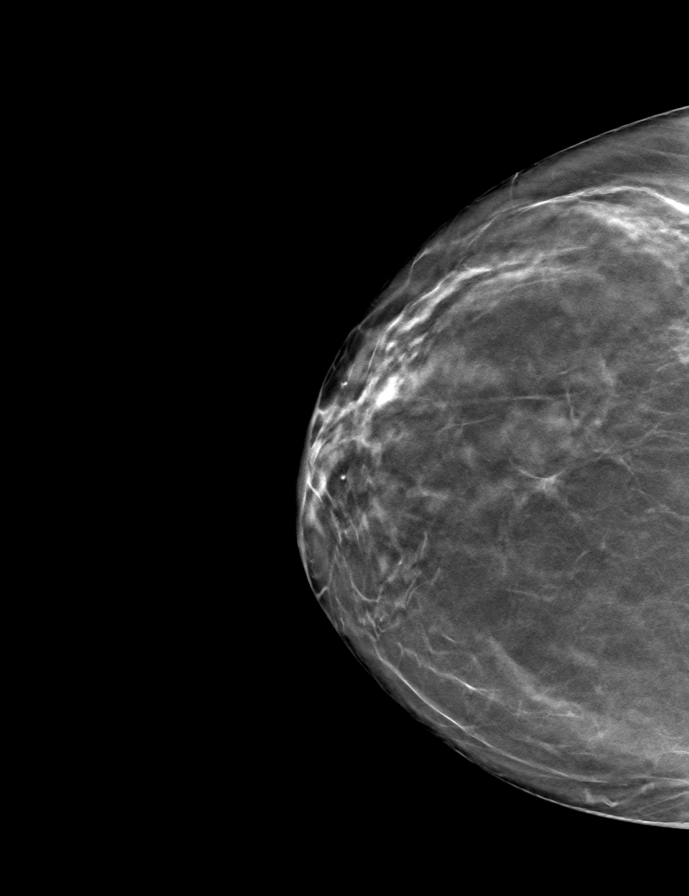

[L CC tomo · tomo slice 35/70.0]
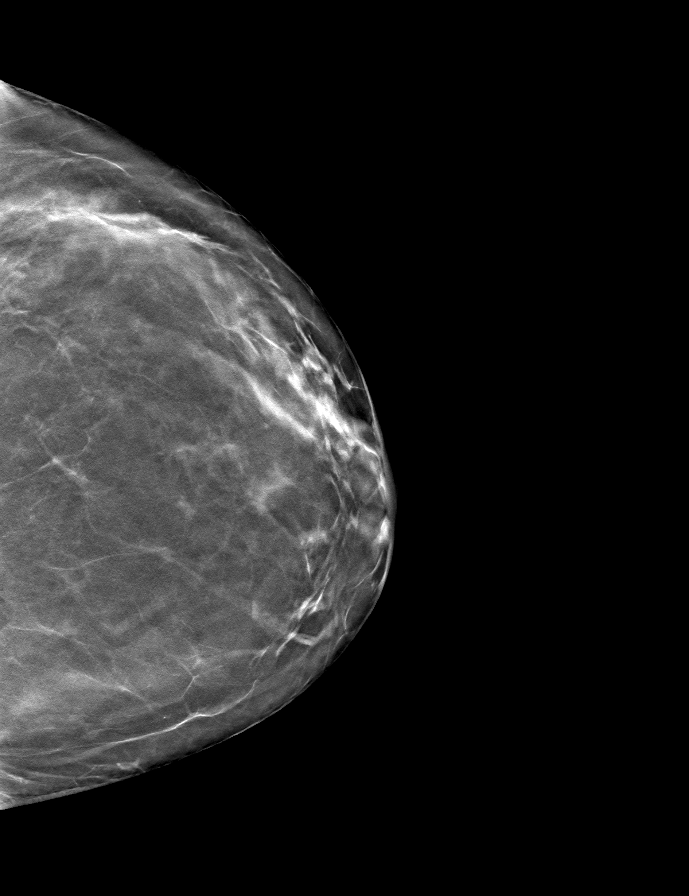

[9 of 24 positions shown; findings below may reference images not displayed]

ACR Breast Density Category b: There are scattered areas of
fibroglandular density.
FINDINGS: There are no findings suspicious for malignancy. Images were
processed with CAD.
IMPRESSION: No mammographic evidence of malignancy. A result letter of this
screening mammogram will be mailed directly to the patient.

RECOMMENDATION:
Screening mammogram in one year. (Code:[id number])

BI-RADS CATEGORY  1: Negative.

## 2019-08-21 NOTE — Telephone Encounter (Signed)
Last Visit: 06/01/2019 °Next Visit: 11/09/2019 °Labs: 06/01/2019 CBC and CMP WNL.   °  °Current Dose per office note on 06/01/2019: colchicine 0.6 mg 1 capsule by mouth daily °  °Okay to refill per Dr. Deveshwar  °

## 2019-09-01 ENCOUNTER — Other Ambulatory Visit: Payer: Self-pay | Admitting: Rheumatology

## 2019-09-01 DIAGNOSIS — M359 Systemic involvement of connective tissue, unspecified: Secondary | ICD-10-CM

## 2019-09-01 NOTE — Telephone Encounter (Signed)
Last Visit:06/01/2019 Next Visit:11/09/2019 Labs:3/25/2021CBC and CMP WNL.  PLQ Eye Exam 02/22/2019 WNL   Current Dose per office note on 06/01/2019: Plaquenil 200 mg 1 tablet in the morning and 1/2 tablet in the evening.  Okay to refill per Dr. Corliss Skains

## 2019-09-15 ENCOUNTER — Other Ambulatory Visit: Payer: Self-pay | Admitting: Rheumatology

## 2019-09-15 NOTE — Telephone Encounter (Signed)
Last Visit:06/01/2019 Next Visit:11/09/2019 Labs:3/25/2021CBC and CMP WNL.   Current Dose per office note on 06/01/2019:colchicine 0.6 mg 1 capsule by mouth daily  Okay to refill per Dr. Corliss Skains

## 2019-10-03 ENCOUNTER — Other Ambulatory Visit (HOSPITAL_COMMUNITY)
Admission: RE | Admit: 2019-10-03 | Discharge: 2019-10-03 | Disposition: A | Discharge status: Home / Self Care | Payer: 59 | Source: Ambulatory Visit | Attending: Pulmonary Disease | Admitting: Pulmonary Disease

## 2019-10-03 DIAGNOSIS — Z20822 Contact with and (suspected) exposure to covid-19: Secondary | ICD-10-CM | POA: Insufficient documentation

## 2019-10-03 DIAGNOSIS — Z01812 Encounter for preprocedural laboratory examination: Secondary | ICD-10-CM | POA: Diagnosis not present

## 2019-10-03 LAB — SARS CORONAVIRUS 2 (TAT 6-24 HRS): SARS Coronavirus 2: NEGATIVE

## 2019-10-05 ENCOUNTER — Other Ambulatory Visit: Payer: Self-pay | Admitting: *Deleted

## 2019-10-05 DIAGNOSIS — R0609 Other forms of dyspnea: Secondary | ICD-10-CM

## 2019-10-06 ENCOUNTER — Other Ambulatory Visit: Payer: Self-pay | Admitting: Rheumatology

## 2019-10-06 ENCOUNTER — Ambulatory Visit: Payer: 59 | Admitting: Pulmonary Disease

## 2019-10-06 ENCOUNTER — Other Ambulatory Visit: Payer: Self-pay

## 2019-10-06 ENCOUNTER — Encounter: Payer: Self-pay | Admitting: Pulmonary Disease

## 2019-10-06 ENCOUNTER — Ambulatory Visit (INDEPENDENT_AMBULATORY_CARE_PROVIDER_SITE_OTHER): Payer: 59 | Admitting: Pulmonary Disease

## 2019-10-06 VITALS — BP 116/76 | HR 70 | Temp 98.5°F | Ht 65.0 in | Wt 145.0 lb

## 2019-10-06 DIAGNOSIS — M3501 Sicca syndrome with keratoconjunctivitis: Secondary | ICD-10-CM

## 2019-10-06 DIAGNOSIS — J842 Lymphoid interstitial pneumonia: Secondary | ICD-10-CM | POA: Diagnosis not present

## 2019-10-06 DIAGNOSIS — R0600 Dyspnea, unspecified: Secondary | ICD-10-CM

## 2019-10-06 DIAGNOSIS — Z79899 Other long term (current) drug therapy: Secondary | ICD-10-CM

## 2019-10-06 DIAGNOSIS — M359 Systemic involvement of connective tissue, unspecified: Secondary | ICD-10-CM

## 2019-10-06 DIAGNOSIS — R0609 Other forms of dyspnea: Secondary | ICD-10-CM

## 2019-10-06 LAB — PULMONARY FUNCTION TEST
DL/VA % pred: 93 %
DL/VA: 3.84 ml/min/mmHg/L
DLCO cor % pred: 87 %
DLCO cor: 20.28 ml/min/mmHg
DLCO unc % pred: 87 %
DLCO unc: 20.28 ml/min/mmHg
FEF 25-75 Post: 2.51 L/sec
FEF 25-75 Pre: 3.03 L/sec
FEF2575-%Change-Post: -17 %
FEF2575-%Pred-Post: 95 %
FEF2575-%Pred-Pre: 115 %
FEV1-%Change-Post: -1 %
FEV1-%Pred-Post: 93 %
FEV1-%Pred-Pre: 94 %
FEV1-Post: 2.77 L
FEV1-Pre: 2.81 L
FEV1FVC-%Change-Post: -2 %
FEV1FVC-%Pred-Pre: 103 %
FEV6-%Change-Post: 0 %
FEV6-%Pred-Post: 92 %
FEV6-%Pred-Pre: 92 %
FEV6-Post: 3.42 L
FEV6-Pre: 3.45 L
FEV6FVC-%Change-Post: 0 %
FEV6FVC-%Pred-Post: 103 %
FEV6FVC-%Pred-Pre: 103 %
FVC-%Change-Post: 0 %
FVC-%Pred-Post: 90 %
FVC-%Pred-Pre: 89 %
FVC-Post: 3.48 L
FVC-Pre: 3.45 L
Post FEV1/FVC ratio: 80 %
Post FEV6/FVC ratio: 100 %
Pre FEV1/FVC ratio: 81 %
Pre FEV6/FVC Ratio: 100 %
RV % pred: 116 %
RV: 2.52 L
TLC % pred: 104 %
TLC: 5.94 L

## 2019-10-06 NOTE — Telephone Encounter (Signed)
Her labs are due in August.  Please remind patient.  I will refill the prescription.

## 2019-10-06 NOTE — Progress Notes (Signed)
@Patient  ID: Stefanie [name] Stage, female    DOB: [birth year], 59 y.o.   MRN: [id number]  Chief Complaint  Patient presents with   Follow-up    go over pft results    Referring provider: Creola Cornusso, John, MD  HPI:  59 year old female never smoker initially consulted with our practice in May/2021 for dyspnea on exertion  PMH: Anemia, anxiety, positive ANA, autoimmune disease, Sjogren's syndrome, fibromyalgia Smoker/ Smoking History: Never Smoker  Maintenance:   Pt of: Dr. Isaiah SergeMannam  10/06/2019  - Visit   59 year old female never smoker initially consulted with our practice in May/2021 for dyspnea on exertion.  She is followed by Dr. Isaiah SergeMannam.  Patient presenting today to complete pulmonary function testing.  It was also recommended at last office visit that she obtain a high-resolution CT chest.  Previous CTs in 2016 showed groundglass opacity since he had scattered cysts.  08/01/2019-CT chest high-res-spectrum of findings suggestive of cystic and nodular interstitial lung disease with pattern most suggestive of LIP, findings are suggestive of an alternative diagnosis not UIP  10/06/2019-pulmonary function test-FVC 3.45 (89% predicted), postbronchodilator ratio 80, postbronchodilator FEV1 2.77 (93% predicted), no bronchodilator response, DLCO 20.28 (87% predicted)  Patient reports that she will see rheumatology in September/2021.  She is also planning on presenting to their office in August due to acute worsening knee pain she feels that she may need to have a knee procedure done in office.  She is established with Dr. Larey Seateveshswar.  She remains adherent to her Plaquenil.  She is resistant to starting methotrexate but this has been offered in the past by rheumatology.  She has aspects of shortness of breath but she does not feel this is acutely worsened.  Sometimes she has to take a rescue inhaler to help with this.  She typically has more shortness of breath when outside in [place]/heat/humidity  and walking upstairs       Questionaires / Pulmonary Flowsheets:   ACT:  No flowsheet data found.  MMRC: No flowsheet data found.  Epworth:  Results of the Epworth flowsheet 07/18/2019  Sitting and reading 3  Watching TV 3  Sitting, inactive in a public place (e.g. a theatre or a meeting) 3  As a passenger in a car for an hour without a break 0  Lying down to rest in the afternoon when circumstances permit 0  Sitting and talking to someone 0  Sitting quietly after a lunch without alcohol 2  In a car, while stopped for a few minutes in traffic 3  Total score 14    Tests:   Imaging: CTA 08/16/2014-mild right middle lobe bronchovascular opacities with tree-in-bud, scattered lung cysts.  08/01/2019-CT chest high-res-spectrum of findings suggestive of cystic and nodular interstitial lung disease with pattern most suggestive of LIP, findings are suggestive of an alternative diagnosis not UIP  10/06/2019-pulmonary function test-FVC 3.45 (89% predicted), postbronchodilator ratio 80, postbronchodilator FEV1 2.77 (93% predicted), no bronchodilator response, DLCO 20.28 (87% predicted)  PFTs: 04/14/2018 FVC 3.59 [99%], FEV1 2.88 [102%], F/F 80, TLC 8.09 [151%], DLCO 17.13 [78%] Overinflation, minimal reduction in diffusion capacity.  Labs: CBC 06/01/2019-WBC 4.2, eos 2.4%, absolute eosinophil count 101  FENO:  No results found for: NITRICOXIDE  PFT: PFT Results Latest Ref Rng & Units 10/06/2019 04/14/2018  FVC-Pre L 3.45 3.46  FVC-Predicted Pre % 89 95  FVC-Post L 3.48 3.59  FVC-Predicted Post % 90 99  Pre FEV1/FVC % % 81 80  Post FEV1/FCV % % 80 80  FEV1-Pre L  2.81 2.75  FEV1-Predicted Pre % 94 98  FEV1-Post L 2.77 2.88  DLCO uncorrected ml/min/mmHg 20.28 17.13  DLCO UNC% % 87 78  DLCO corrected ml/min/mmHg 20.28 -  DLCO COR %Predicted % 87 -  DLVA Predicted % 93 81  TLC L 5.94 8.09  TLC % Predicted % 104 151  RV % Predicted % 116 224    WALK:  No flowsheet data  found.  Imaging: No results found.  Lab Results:  CBC    Component Value Date/Time   WBC 4.2 06/01/2019 1602   RBC 4.79 06/01/2019 1602   HGB 13.9 06/01/2019 1602   HGB 11.9 04/23/2010 1532   HCT 41.5 06/01/2019 1602   HCT 34.1 (L) 04/23/2010 1532   PLT 247 06/01/2019 1602   PLT 243 04/23/2010 1532   MCV 86.6 06/01/2019 1602   MCV 86.5 04/23/2010 1532   MCH 29.0 06/01/2019 1602   MCHC 33.5 06/01/2019 1602   RDW 12.1 06/01/2019 1602   RDW 13.8 04/23/2010 1532   LYMPHSABS 1,453 06/01/2019 1602   LYMPHSABS 1.1 04/23/2010 1532   MONOABS 0.5 07/28/2016 1730   MONOABS 0.5 04/23/2010 1532   EOSABS 101 06/01/2019 1602   EOSABS 0.0 04/23/2010 1532   BASOSABS 42 06/01/2019 1602   BASOSABS 0.0 04/23/2010 1532    BMET    Component Value Date/Time   NA 138 06/01/2019 1602   K 3.9 06/01/2019 1602   CL 102 06/01/2019 1602   CO2 28 06/01/2019 1602   GLUCOSE 90 06/01/2019 1602   BUN 15 06/01/2019 1602   CREATININE 0.70 06/01/2019 1602   CALCIUM 9.9 06/01/2019 1602   GFRNONAA 95 06/01/2019 1602   GFRAA 110 06/01/2019 1602    BNP No results found for: BNP  ProBNP    Component Value Date/Time   PROBNP 103.2 12/06/2012 1709    Specialty Problems      Pulmonary Problems   Pleuritic chest pain   Gasping for breath   Lymphoid interstitial pneumonia (HCC)      Allergies  Allergen Reactions   Iron Sucrose Rash    Chest pain   Morphine     Other reaction(s): Vomiting   Venofer [Ferric Oxide] Palpitations    Dizziness, sweating, "felt like I was going to pass out" unknown when episode happened, itching   Azithromycin Other (See Comments)    "couldnt sleep"   Other     Other reaction(s): Other (See Comments) Magic mouth wash. Caused tongue to have a burning feeling    Immunization History  Administered Date(s) Administered   Influenza-Unspecified 09/28/2018    Past Medical History:  Diagnosis Date   Anemia    Arthritis    Asthma    Diabetes  insipidus (HCC)    Fibromyalgia    Lupus (HCC)    Mitral valve prolapse    SLE (systemic lupus erythematosus) (HCC) 12/06/2012   Thyroid disease     Tobacco History: Social History   Tobacco Use  Smoking Status Never Smoker  Smokeless Tobacco Never Used   Counseling given: Not Answered   Continue to not smoke  Outpatient Encounter Medications as of 10/06/2019  Medication Sig   albuterol (PROVENTIL HFA;VENTOLIN HFA) 108 (90 BASE) MCG/ACT inhaler Inhale 1 puff into the lungs every 6 (six) hours as needed for wheezing or shortness of breath.   Ascorbic Acid (VITAMIN C) 1000 MG tablet Take 1,000 mg by mouth daily.   hydroxychloroquine (PLAQUENIL) 200 MG tablet TAKE 1 TABLET BY MOUTH IN THE MORNING  AND 1/2 TABLET EVERY EVENING   levothyroxine (SYNTHROID, LEVOTHROID) 125 MCG tablet Take 125 mcg by mouth at bedtime.    MITIGARE 0.6 MG CAPS TAKE 1 CAPSULE BY MOUTH EVERY DAY   rizatriptan (MAXALT) 10 MG tablet Take 10 mg by mouth daily as needed for migraine. May repeat in 2 hours if needed   Turmeric 450 MG CAPS Take by mouth as needed.    No facility-administered encounter medications on file as of 10/06/2019.     Review of Systems  Review of Systems  Constitutional: Negative for activity change, fatigue and fever.  HENT: Negative for sinus pressure, sinus pain and sore throat.   Respiratory: Positive for shortness of breath. Negative for cough and wheezing.   Cardiovascular: Negative for chest pain and palpitations.  Gastrointestinal: Negative for diarrhea, nausea and vomiting.  Musculoskeletal: Negative for arthralgias.  Neurological: Negative for dizziness.  Psychiatric/Behavioral: Negative for sleep disturbance. The patient is not nervous/anxious.      Physical Exam  BP 116/76 (BP Location: Left Arm, Cuff Size: Normal)    Pulse 70    Temp 98.5 F (36.9 C) (Oral)    Ht 5\' 5"  (1.651 m)    Wt 145 lb (65.8 kg)    LMP 04/09/2014 (Approximate)    SpO2 99%    BMI  24.13 kg/m   Wt Readings from Last 5 Encounters:  10/06/19 145 lb (65.8 kg)  07/18/19 144 lb (65.3 kg)  06/01/19 144 lb 9.6 oz (65.6 kg)  04/28/19 146 lb 6.4 oz (66.4 kg)  12/06/18 155 lb (70.3 kg)    BMI Readings from Last 5 Encounters:  10/06/19 24.13 kg/m  07/18/19 23.24 kg/m  06/01/19 23.34 kg/m  04/28/19 22.93 kg/m  12/06/18 24.28 kg/m     Physical Exam Vitals and nursing note reviewed.  Constitutional:      General: She is not in acute distress.    Appearance: Normal appearance. She is normal weight.  HENT:     Head: Normocephalic and atraumatic.     Right Ear: External ear normal.     Left Ear: External ear normal.     Nose: Nose normal. No congestion.     Mouth/Throat:     Mouth: Mucous membranes are moist.     Pharynx: Oropharynx is clear.  Eyes:     Pupils: Pupils are equal, round, and reactive to light.  Cardiovascular:     Rate and Rhythm: Normal rate and regular rhythm.     Pulses: Normal pulses.     Heart sounds: Normal heart sounds. No murmur heard.   Pulmonary:     Breath sounds: Normal breath sounds. No decreased air movement. No decreased breath sounds, wheezing or rales.  Musculoskeletal:     Cervical back: Normal range of motion.  Skin:    General: Skin is warm and dry.     Capillary Refill: Capillary refill takes less than 2 seconds.  Neurological:     General: No focal deficit present.     Mental Status: She is alert and oriented to person, place, and time. Mental status is at baseline.     Gait: Gait normal.  Psychiatric:        Mood and Affect: Mood normal.        Behavior: Behavior normal.        Thought Content: Thought content normal.        Judgment: Judgment normal.       Assessment & Plan:   Lymphoid interstitial pneumonia (HCC)  Plan: Discussed case with Dr. Isaiah Serge Walk today in office We will have patient follow back up in 6 months with spirometry with DLCO We will repeat high-resolution CT chest in 1  year  Sjogren's syndrome with keratoconjunctivitis sicca (HCC) Plan: Keep follow-up with rheumatology Continue Plaquenil   High risk medication use Continue follow-up with rheumatology    Return in about 6 months (around 04/07/2020), or if symptoms worsen or fail to improve, for Follow up with Dr. Isaiah Serge, [facility] [place].   Coral Ceo, NP 10/06/2019   This appointment required 32 minutes of patient care (this includes precharting, chart review, review of results, face-to-face care, etc.).

## 2019-10-06 NOTE — Progress Notes (Signed)
Full PFT performed today. °

## 2019-10-06 NOTE — Assessment & Plan Note (Signed)
Continue follow-up with rheumatology 

## 2019-10-06 NOTE — Progress Notes (Signed)
Office Visit Note  Patient: Stefanie [name]             Date of Birth: March 07, 1961           MRN: [id number]             PCP: Creola Corn, MD Referring: Creola Corn, MD Visit Date: 10/16/2019 Occupation: @GUAROCC @  Subjective:  Other (bilateral knee pain, bilateral ankle pain. patient reports swelling in both legs/ankles. neck pain)   History of Present Illness: Stefanie [name] is a 59 y.o. female with history of autoimmune disease and Sjogren's.  She states she was seen by Dr. 46 for abnormal high-resolution CT.  He will like to repeat the CT before making a decision regarding her diagnosis.  She still gets some shortness of breath.  She continues to have sicca symptoms.  She states her knee joints have been hurting more recently.  She also gets pedal edema towards the end of the day.  She has some stiffness in her neck.  She is planning to see Dr. Isaiah Serge for her neck.  Activities of Daily Living:  Patient reports morning stiffness for  1 hour.   Patient Reports nocturnal pain.  Difficulty dressing/grooming: Denies Difficulty climbing stairs: Reports Difficulty getting out of chair: Reports Difficulty using hands for taps, buttons, cutlery, and/or writing: Reports  Review of Systems  Constitutional: Positive for fatigue.  HENT: Positive for mouth dryness and nose dryness. Negative for mouth sores.   Eyes: Positive for dryness. Negative for itching.  Respiratory: Positive for difficulty breathing. Negative for shortness of breath.   Cardiovascular: Positive for swelling in legs/feet. Negative for palpitations.  Gastrointestinal: Negative for blood in stool, constipation and diarrhea.  Endocrine: Negative for increased urination.  Genitourinary: Negative for difficulty urinating.  Musculoskeletal: Positive for arthralgias, joint pain, morning stiffness and muscle tenderness. Negative for joint swelling, myalgias and myalgias.  Skin: Negative for color change, rash and  redness.  Allergic/Immunologic: Negative for susceptible to infections.  Neurological: Negative for dizziness, numbness, headaches, memory loss and weakness.  Hematological: Positive for bruising/bleeding tendency.  Psychiatric/Behavioral: Negative for confusion.    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   Prolonged QT interval 07/28/2016   Primary osteoarthritis of both hands 05/11/2016   Primary osteoarthritis of both knees 05/11/2016   History of hypothyroidism 05/11/2016   History of migraine 05/11/2016   Autoimmune disease (HCC) positive ANA, 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, 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, anticardiolipin antibody and positive rheumatoid factor 04/29/2016   ANA positive 04/29/2016   Rheumatoid factor positive 04/29/2016   Pseudogout 04/29/2016   Chondrocalcinosis 04/29/2016   High risk medication use 04/29/2016   Sjogren's syndrome with keratoconjunctivitis sicca (HCC) 04/29/2016   History of neutropenia 04/29/2016   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   Hyponatremia 12/12/2013   Fever 12/13/2012   Pleuritic chest pain 12/06/2012   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   Anxiety 12/06/2012   ANEMIA-IRON DEFICIENCY 01/08/2010   GASTRIC DILATION, ACUTE 01/08/2010   NAUSEA ALONE 01/08/2010   ABDOMINAL PAIN, LEFT UPPER QUADRANT 01/08/2010    Past Medical History:  Diagnosis Date   Anemia    Arthritis  Asthma    Diabetes insipidus (HCC)    Fibromyalgia    Lupus (HCC)    Mitral valve prolapse    SLE (systemic lupus erythematosus) (HCC) 12/06/2012   Thyroid  disease     Family History  Problem Relation Age of Onset   Diabetes Father    Heart disease Father    Hyperlipidemia Father    Hypertension Father    Stroke Father    Diabetes Sister    Kidney disease Sister        transplant   Heart disease Sister        open heart surgery    Pancreatic disease Sister        transplant    Cancer Maternal Grandmother    Diabetes Paternal Grandfather    Heart disease Paternal Grandfather    Hyperlipidemia Paternal Grandfather    Hypertension Paternal ActorGrandfather    Healthy Daughter    Healthy Son    Past Surgical History:  Procedure Laterality Date   PITUITARY EXCISION     TONSILLECTOMY     Social History   Social History Narrative   Not on file   Immunization History  Administered Date(s) Administered   Influenza-Unspecified 09/28/2018     Objective: Vital Signs: BP 104/70 (BP Location: Left Arm, Patient Position: Sitting, Cuff Size: Normal)    Pulse 76    Resp 13    Ht 5\' 6"  (1.676 m)    Wt 144 lb 12.8 oz (65.7 kg)    LMP 04/09/2014 (Approximate)    BMI 23.37 kg/m    Physical Exam Vitals and nursing note reviewed.  Constitutional:      Appearance: She is well-developed.  HENT:     Head: Normocephalic and atraumatic.  Eyes:     Conjunctiva/sclera: Conjunctivae normal.  Cardiovascular:     Rate and Rhythm: Normal rate and regular rhythm.     Heart sounds: Normal heart sounds.  Pulmonary:     Effort: Pulmonary effort is normal.     Breath sounds: Normal breath sounds.  Abdominal:     General: Bowel sounds are normal.     Palpations: Abdomen is soft.  Musculoskeletal:     Cervical back: Normal range of motion.  Lymphadenopathy:     Cervical: No cervical adenopathy.  Skin:    General: Skin is warm and dry.     Capillary Refill: Capillary refill takes less than 2 seconds.  Neurological:     Mental Status: She is alert and oriented to person, place, and time.  Psychiatric:        Behavior: Behavior  normal.      Musculoskeletal Exam: She has stiffness with range of motion of her cervical spine.  Shoulder joints, elbow joints, wrist joints, MCPs PIPs and DIPs with good range of motion with no synovitis.  She is good range of motion of bilateral hip joints.  She has warmth swelling and small effusion in her right knee joint.  Warmth was noted on her left knee joint.  She had no warmth over the ankle joints.  She has mild pitting edema on her bilateral lower extremities.  She has MTP and PIP thickening in her feet without synovitis.  A callus was noted on her right heel.  CDAI Exam: CDAI Score: -- Patient Global: --; Provider Global: -- Swollen: --; Tender: -- Joint Exam 10/16/2019   No joint exam has been documented for this visit   There is currently no information documented on the homunculus. Go  to the Rheumatology activity and complete the homunculus joint exam.  Investigation: No additional findings.  Imaging: No results found.  Recent Labs: Lab Results  Component Value Date   WBC 4.2 06/01/2019   HGB 13.9 06/01/2019   PLT 247 06/01/2019   NA 138 06/01/2019   K 3.9 06/01/2019   CL 102 06/01/2019   CO2 28 06/01/2019   GLUCOSE 90 06/01/2019   BUN 15 06/01/2019   CREATININE 0.70 06/01/2019   BILITOT 0.6 06/01/2019   ALKPHOS 97 07/30/2016   AST 29 06/01/2019   ALT 25 06/01/2019   PROT 7.5 06/01/2019   ALBUMIN 3.6 07/30/2016   CALCIUM 9.9 06/01/2019   GFRAA 110 06/01/2019    Speciality Comments: PLQ Eye Exam 02/22/2019 WNL @ [facility] follow up in 1 year  Procedures:  No procedures performed Allergies: Iron sucrose, Morphine, Venofer [ferric oxide], Azithromycin, and Other   Assessment / Plan:     Visit Diagnoses: Autoimmune disease (HCC) positive ANA, positive Ro, positive La, anticardiolipin antibody and positive rheumatoid factor -she has been experiencing increased pain and discomfort.  She continues to have sicca symptoms.  I will obtain labs  today.  Plan: Anti-DNA antibody, double-stranded, C3 and C4, Cardiolipin antibodies, IgG, IgM, IgA, Rheumatoid factor, Sjogrens syndrome-B extractable nuclear antibody  High risk medication use - Plaquenil 200 mg 1 tablet in the morning and 1/2 tablet in the evening. PLQ Eye Exam 02/22/2019  - Plan: CBC with Differential/Platelet, COMPLETE METABOLIC PANEL WITH GFR, Urinalysis, Routine w reflex microscopic, Serum protein electrophoresis with reflex, Thiopurine methyltransferase(tpmt)rbc, Hepatitis B core antibody, IgM, Hepatitis B surface antigen, Hepatitis C antibody, HIV Antibody (routine testing w rflx), QuantiFERON-TB Gold Plus, IgG, IgA, IgM  Sjogren's syndrome with keratoconjunctivitis sicca (HCC) - Plan: Sedimentation rate, Sjogrens syndrome-A extractable nuclear antibody  Lymphoid interstitial pneumonia-this was diagnosed based on the high-resolution CT.  I am also concerned if she has underlying interstitial lung disease.  She was evaluated by Dr. Isaiah Serge.  He is repeating high-resolution CT.  If she has underlying ILD I would like to start her on Imuran as soon as possible as she has inflammatory arthritis as well.  Pain in right ankle and joints of right foot - MRI 12/23/2017 MRI of right ankle revealed insertional achilles tendinosis with retrocalcaneal bursitis.  Have advised her to schedule an appoint with the podiatrist.  She has a callus on her right heel.  Primary osteoarthritis of both hands-she had no synovitis in her hands.  Primary osteoarthritis of both knees-she has pain and discomfort in her bilateral knee joints.  Her knee joints are warm to touch.  She also had a small effusion in her right knee.  She has chondrocalcinosis and has been taking colchicine.  Chondromalacia patellae, right knee  Cervical spondylosis without myelopathy-she has been experiencing increased neck pain and right-sided radiculopathy.  She plans to see her neurologist.  Chondrocalcinosis - colchicine  0.6 mg 1 capsule by mouth daily.  Fibromyalgia-she continues to have generalized pain and discomfort from fibromyalgia.  Other medical problems are listed as follows:  Other fatigue  History of diabetes insipidus  History of migraine  History of anxiety  History of hypothyroidism  Prolonged QT interval   COVID-19 -patient has not received COVID-19 vaccination.  I detailed discussion regarding getting COVID-19 vaccination as soon as possible.  I would like for her to be fully vaccinated before she starts Imuran.  Use of mask, hand hygiene and social distancing was emphasized.  Orders: Orders Placed This  Encounter  Procedures   CBC with Differential/Platelet   COMPLETE METABOLIC PANEL WITH GFR   Urinalysis, Routine w reflex microscopic   Anti-DNA antibody, double-stranded   C3 and C4   Sedimentation rate   Sjogrens syndrome-A extractable nuclear antibody   Cardiolipin antibodies, IgG, IgM, IgA   Rheumatoid factor   Sjogrens syndrome-B extractable nuclear antibody   Serum protein electrophoresis with reflex   Thiopurine methyltransferase(tpmt)rbc   Hepatitis B core antibody, IgM   Hepatitis B surface antigen   Hepatitis C antibody   HIV Antibody (routine testing w rflx)   QuantiFERON-TB Gold Plus   IgG, IgA, IgM   No orders of the defined types were placed in this encounter.   .  Follow-Up Instructions: Return for Autoimmune disease, Sjogren's.   Pollyann Savoy, MD  Note - This record has been created using Animal nutritionist.  Chart creation errors have been sought, but may not always  have been located. Such creation errors do not reflect on  the standard of medical care.

## 2019-10-06 NOTE — Telephone Encounter (Signed)
Patient advised she is due for labs in August 2021.   Patient states she is having pain in bilateral achilles tendons. Patient states she is also having pain in bilateral knees. Patient states she is having difficulty walking. Patient states she has continued to have pain in the right side of her neck. Patient has been scheduled for a sooner appointment on 10/16/2019.

## 2019-10-06 NOTE — Assessment & Plan Note (Signed)
Plan: Discussed case with Dr. Isaiah Serge Walk today in office We will have patient follow back up in 6 months with spirometry with [facility] We will repeat high-resolution CT chest in 1 year

## 2019-10-06 NOTE — Patient Instructions (Addendum)
You were seen today by Coral Ceo, NP  for:   1. Lymphoid interstitial pneumonia (HCC)  - Pulmonary function test; Future - CT Chest High Resolution; Future  Walk in office   We will repeat a 30-minute breathing test in 6 months   Follow-up with Dr. Isaiah Serge at the time  Continue Plaquenil  We will repeat a CT in 1 year  2. Sjogren's syndrome with keratoconjunctivitis sicca (HCC)  Continue follow-up with rheumatology  Continue Plaquenil  3. High risk medication use  Continue Plaquenil   We recommend today:  Orders Placed This Encounter  Procedures  . CT Chest High Resolution    -High-res CT with supine and prone positioning -inspiratory and expiratory cuts.  -Only to be read by Dr. Fredirick Lathe and Dr. Llana Aliment.    Standing Status:   Future    Standing Expiration Date:   10/05/2020    Scheduling Instructions:     May/2022    Order Specific Question:   Is patient pregnant?    Answer:   No    Order Specific Question:   Preferred imaging location?    Answer:   [facility]    Order Specific Question:   Radiology Contrast Protocol - do NOT remove file path    Answer:   [email or web address]  . Pulmonary function test    Standing Status:   Future    Standing Expiration Date:   10/05/2020    Scheduling Instructions:     Cleda Daub with [facility]    Order Specific Question:   Where should this test be performed?    Answer:   [facility]   Orders Placed This Encounter  Procedures  . CT Chest High Resolution  . Pulmonary function test   No orders of the defined types were placed in this encounter.   Follow Up:    Return in about 6 months (around 04/07/2020), or if symptoms worsen or fail to improve, for Follow up with Dr. Isaiah Serge, [facility] [place].   Please do your part to reduce the spread of COVID-19:      Reduce your risk of any infection  and COVID19 by using the similar precautions used for avoiding the common cold  or flu:  [name] Wash your hands often with soap and warm water for at least 20 seconds.  If soap and water are not readily available, use an alcohol-based hand sanitizer with at least 60% alcohol.  . If coughing or sneezing, cover your mouth and nose by coughing or sneezing into the elbow areas of your shirt or coat, into a tissue or into your sleeve (not your hands). Drinda Butts A MASK when in public  . Avoid shaking hands with others and consider head nods or verbal greetings only. . Avoid touching your eyes, nose, or mouth with unwashed hands.  . Avoid close contact with people who are sick. . Avoid places or events with large numbers of people in one location, like concerts or sporting events. . If you have some symptoms but not all symptoms, continue to monitor at home and seek medical attention if your symptoms worsen. . If you are having a medical emergency, call 911.   ADDITIONAL HEALTHCARE OPTIONS FOR PATIENTS  [facility] / e-Visit: [email or web address]         [facility]: [phone]  [facility]: [phone]  [facility]: [phone]     It is flu season:   >>> Best ways to protect herself from the flu: Receive the yearly flu vaccine, practice good hand hygiene washing with soap and also using hand sanitizer when available, eat a nutritious meals, get adequate rest, hydrate appropriately   Please contact the office if your symptoms worsen or you have concerns that you are not improving.   Thank you for choosing [facility] for your healthcare, and for allowing [place] to partner with you on your healthcare journey. I am thankful to be able to provide care to you today.   Wyn Quaker FNP-C

## 2019-10-06 NOTE — Assessment & Plan Note (Signed)
Plan: Keep follow-up with rheumatology Continue Plaquenil

## 2019-10-07 ENCOUNTER — Other Ambulatory Visit: Payer: Self-pay | Admitting: Rheumatology

## 2019-10-09 NOTE — Telephone Encounter (Signed)
Last Visit: 06/01/2019 Next Visit: 10/16/2019 Labs: 06/01/2019 CBC and CMP WNL  Current Dose per office note 06/01/2019: colchicine 0.6 mg 1 capsule by mouth daily. DX: Chondrocalcinosis   Okay to refill per Dr. Corliss Skains

## 2019-10-16 ENCOUNTER — Encounter: Payer: Self-pay | Admitting: Rheumatology

## 2019-10-16 ENCOUNTER — Other Ambulatory Visit: Payer: Self-pay

## 2019-10-16 ENCOUNTER — Ambulatory Visit: Payer: 59 | Admitting: Rheumatology

## 2019-10-16 VITALS — BP 104/70 | HR 76 | Resp 13 | Ht 66.0 in | Wt 144.8 lb

## 2019-10-16 DIAGNOSIS — R9431 Abnormal electrocardiogram [ECG] [EKG]: Secondary | ICD-10-CM

## 2019-10-16 DIAGNOSIS — J842 Lymphoid interstitial pneumonia: Secondary | ICD-10-CM

## 2019-10-16 DIAGNOSIS — Z79899 Other long term (current) drug therapy: Secondary | ICD-10-CM | POA: Diagnosis not present

## 2019-10-16 DIAGNOSIS — M359 Systemic involvement of connective tissue, unspecified: Secondary | ICD-10-CM | POA: Diagnosis not present

## 2019-10-16 DIAGNOSIS — M3501 Sicca syndrome with keratoconjunctivitis: Secondary | ICD-10-CM | POA: Diagnosis not present

## 2019-10-16 DIAGNOSIS — M19041 Primary osteoarthritis, right hand: Secondary | ICD-10-CM

## 2019-10-16 DIAGNOSIS — M47812 Spondylosis without myelopathy or radiculopathy, cervical region: Secondary | ICD-10-CM

## 2019-10-16 DIAGNOSIS — M2241 Chondromalacia patellae, right knee: Secondary | ICD-10-CM

## 2019-10-16 DIAGNOSIS — Z8669 Personal history of other diseases of the nervous system and sense organs: Secondary | ICD-10-CM

## 2019-10-16 DIAGNOSIS — M25571 Pain in right ankle and joints of right foot: Secondary | ICD-10-CM

## 2019-10-16 DIAGNOSIS — Z8639 Personal history of other endocrine, nutritional and metabolic disease: Secondary | ICD-10-CM

## 2019-10-16 DIAGNOSIS — Z8659 Personal history of other mental and behavioral disorders: Secondary | ICD-10-CM

## 2019-10-16 DIAGNOSIS — M170 Bilateral primary osteoarthritis of knee: Secondary | ICD-10-CM

## 2019-10-16 DIAGNOSIS — M797 Fibromyalgia: Secondary | ICD-10-CM

## 2019-10-16 DIAGNOSIS — M1120 Other chondrocalcinosis, unspecified site: Secondary | ICD-10-CM

## 2019-10-16 DIAGNOSIS — R5383 Other fatigue: Secondary | ICD-10-CM

## 2019-10-16 DIAGNOSIS — M19042 Primary osteoarthritis, left hand: Secondary | ICD-10-CM

## 2019-10-16 NOTE — Patient Instructions (Addendum)
   COVID-19 vaccine recommendations:   COVID-19 vaccine is recommended for everyone (unless you are allergic to a vaccine component), even if you are on a medication that suppresses your immune system.   If you are on Methotrexate, Cellcept (mycophenolate), Rinvoq, Xeljanz, and Olumiant- hold the medication for 1 week after each vaccine. Hold Methotrexate for 2 weeks after the single dose COVID-19 vaccine.   If you are on Orencia subcutaneous injection - hold medication one week prior to and one week after the first COVID-19 vaccine dose (only).   If you are on Orencia IV infusions- time vaccination administration so that the first COVID-19 vaccination will occur four weeks after the infusion and postpone the subsequent infusion by one week.   If you are on Cyclophosphamide or Rituxan infusions please contact your doctor prior to receiving the COVID-19 vaccine.   Do not take Tylenol or ant anti-inflammatory medications (NSAIDs) 24 hours prior to the COVID-19 vaccination.   There is no direct evidence about the efficacy of the COVID-19 vaccine in individuals who are on medications that suppress the immune system.   Even if you are fully vaccinated, and you are on any medications that suppress your immune system, please continue to wear a mask, maintain at least six feet social distance and practice hand hygiene.   If you develop a COVID-19 infection, please contact your PCP or our office to determine if you need antibody infusion.  We anticipate that a booster vaccine will be available soon for immunosuppressed individuals. Please cal our office before receiving your booster dose to make adjustments to your medication regimen.  

## 2019-10-23 LAB — QUANTIFERON-TB GOLD PLUS
Mitogen-NIL: 8.62 IU/mL
NIL: 0.11 IU/mL
QuantiFERON-TB Gold Plus: NEGATIVE
TB1-NIL: 0 IU/mL
TB2-NIL: 0 IU/mL

## 2019-10-23 LAB — COMPLETE METABOLIC PANEL WITH GFR
AG Ratio: 1.5 (calc) (ref 1.0–2.5)
ALT: 19 U/L (ref 6–29)
AST: 26 U/L (ref 10–35)
Albumin: 4.8 g/dL (ref 3.6–5.1)
Alkaline phosphatase (APISO): 97 U/L (ref 37–153)
BUN: 13 mg/dL (ref 7–25)
CO2: 26 mmol/L (ref 20–32)
Calcium: 9.9 mg/dL (ref 8.6–10.4)
Chloride: 102 mmol/L (ref 98–110)
Creat: 0.75 mg/dL (ref 0.50–1.05)
GFR, Est African American: 101 mL/min/{1.73_m2} (ref >=60)
GFR, Est Non African American: 87 mL/min/{1.73_m2} (ref >=60)
Globulin: 3.2 g/dL (calc) (ref 1.9–3.7)
Glucose, Bld: 78 mg/dL (ref 65–99)
Potassium: 3.8 mmol/L (ref 3.5–5.3)
Sodium: 139 mmol/L (ref 135–146)
Total Bilirubin: 0.8 mg/dL (ref 0.2–1.2)
Total Protein: 8 g/dL (ref 6.1–8.1)

## 2019-10-23 LAB — CBC WITH DIFFERENTIAL/PLATELET
Absolute Monocytes: 443 cells/uL (ref 200–950)
Basophils Absolute: 39 cells/uL (ref 0–200)
Basophils Relative: 0.9 %
Eosinophils Absolute: 60 cells/uL (ref 15–500)
Eosinophils Relative: 1.4 %
HCT: 38.7 % (ref 35.0–45.0)
Hemoglobin: 13.1 g/dL (ref 11.7–15.5)
Lymphs Abs: 1415 cells/uL (ref 850–3900)
MCH: 30.5 pg (ref 27.0–33.0)
MCHC: 33.9 g/dL (ref 32.0–36.0)
MCV: 90 fL (ref 80.0–100.0)
MPV: 11.6 fL (ref 7.5–12.5)
Monocytes Relative: 10.3 %
Neutro Abs: 2344 cells/uL (ref 1500–7800)
Neutrophils Relative %: 54.5 %
Platelets: 226 10*3/uL (ref 140–400)
RBC: 4.3 10*6/uL (ref 3.80–5.10)
RDW: 12.3 % (ref 11.0–15.0)
Total Lymphocyte: 32.9 %
WBC: 4.3 10*3/uL (ref 3.8–10.8)

## 2019-10-23 LAB — SJOGRENS SYNDROME-B EXTRACTABLE NUCLEAR ANTIBODY: SSB (La) (ENA) Antibody, IgG: <1 AI

## 2019-10-23 LAB — IGG, IGA, IGM
IgG (Immunoglobin G), Serum: 1607 mg/dL (ref 600–1640)
IgM, Serum: 151 mg/dL (ref 50–300)
Immunoglobulin A: 389 mg/dL — ABNORMAL HIGH (ref 47–310)

## 2019-10-23 LAB — PROTEIN ELECTROPHORESIS, SERUM, WITH REFLEX
Albumin ELP: 4.5 g/dL (ref 3.8–4.8)
Alpha 1: 0.3 g/dL (ref 0.2–0.3)
Alpha 2: 0.8 g/dL (ref 0.5–0.9)
Beta 2: 0.5 g/dL (ref 0.2–0.5)
Beta Globulin: 0.5 g/dL (ref 0.4–0.6)
Gamma Globulin: 1.5 g/dL (ref 0.8–1.7)
Total Protein: 8.1 g/dL (ref 6.1–8.1)

## 2019-10-23 LAB — HEPATITIS C ANTIBODY
Hepatitis C Ab: NONREACTIVE
SIGNAL TO CUT-OFF: 0.02 (ref <1.00)

## 2019-10-23 LAB — HIV ANTIBODY (ROUTINE TESTING W REFLEX): HIV 1&2 Ab, 4th Generation: NONREACTIVE

## 2019-10-23 LAB — URINALYSIS, ROUTINE W REFLEX MICROSCOPIC
Bilirubin Urine: NEGATIVE
Glucose, UA: NEGATIVE
Hgb urine dipstick: NEGATIVE
Ketones, ur: NEGATIVE
Leukocytes,Ua: NEGATIVE
Nitrite: NEGATIVE
Protein, ur: NEGATIVE
Specific Gravity, Urine: 1.007 (ref 1.001–1.03)
pH: 6.5 (ref 5.0–8.0)

## 2019-10-23 LAB — C3 AND C4
C3 Complement: 133 mg/dL (ref 83–193)
C4 Complement: 33 mg/dL (ref 15–57)

## 2019-10-23 LAB — ANTI-DNA ANTIBODY, DOUBLE-STRANDED: ds DNA Ab: 1 IU/mL

## 2019-10-23 LAB — RHEUMATOID FACTOR: Rheumatoid fact SerPl-aCnc: 18 IU/mL — ABNORMAL HIGH (ref <14)

## 2019-10-23 LAB — THIOPURINE METHYLTRANSFERASE (TPMT), RBC: Thiopurine Methyltransferase, RBC: 17 nmol/hr/mL RBC

## 2019-10-23 LAB — SJOGRENS SYNDROME-A EXTRACTABLE NUCLEAR ANTIBODY: SSA (Ro) (ENA) Antibody, IgG: >8 AI — AB

## 2019-10-23 LAB — HEPATITIS B CORE ANTIBODY, IGM: Hep B C IgM: NONREACTIVE

## 2019-10-23 LAB — SEDIMENTATION RATE: Sed Rate: 29 mm/h (ref 0–30)

## 2019-10-23 LAB — CARDIOLIPIN ANTIBODIES, IGG, IGM, IGA
Anticardiolipin IgA: <2 APL-U/mL
Anticardiolipin IgG: <2 GPL-U/mL
Anticardiolipin IgM: 2.1 MPL-U/mL

## 2019-10-23 LAB — HEPATITIS B SURFACE ANTIGEN: Hepatitis B Surface Ag: NONREACTIVE

## 2019-10-24 ENCOUNTER — Telehealth: Payer: Self-pay | Admitting: Rheumatology

## 2019-10-24 DIAGNOSIS — M47812 Spondylosis without myelopathy or radiculopathy, cervical region: Secondary | ICD-10-CM

## 2019-10-24 NOTE — Telephone Encounter (Signed)
Patient called Neurologist, and he can see her. Patient needs a referral sent to Dr. Lesia Sago at [facility]. Please call patient if any questions.

## 2019-10-25 NOTE — Telephone Encounter (Signed)
Okay to refer patient to Dr. Anne Hahn.

## 2019-10-25 NOTE — Telephone Encounter (Signed)
Referral placed.

## 2019-10-26 ENCOUNTER — Ambulatory Visit: Payer: Managed Care, Other (non HMO) | Admitting: Cardiology

## 2019-10-27 NOTE — Progress Notes (Signed)
Office Visit Note  Patient: Stefanie [name]             Date of Birth: 1960/11/24           MRN: [id number]             PCP: Shon Baton, MD Referring: Shon Baton, MD Visit Date: 11/09/2019 Occupation: _0 @  Subjective:  Dry mouth.   History of Present Illness: Yasmeen Manka is a 59 y.o. female with history of Sjogren's and inflammatory arthritis.  She was evaluated by pulmonary and was diagnosed with lymphoid interstitial pneumonitis.  She was advised to come for follow-up CT in 1 year.  She continues to have some pain and inflammation in her knee joints and ankle joints.  She also has history of CPPD.  She states her ankle joints have been swollen and painful for the last 2 weeks.  She was taking Tylenol which helped.  She has been having some neck discomfort and right-sided radiculopathy.  She is scheduled an appointment with the neurologist.  Activities of Daily Living:  Patient reports morning stiffness for 30-45  minutes.   Patient Reports nocturnal pain.  Difficulty dressing/grooming: Denies Difficulty climbing stairs: Reports Difficulty getting out of chair: Reports Difficulty using hands for taps, buttons, cutlery, and/or writing: Reports  Review of Systems  Constitutional: Positive for fatigue.  HENT: Positive for mouth dryness and nose dryness. Negative for mouth sores.   Eyes: Positive for dryness. Negative for itching.  Respiratory: Negative for shortness of breath and difficulty breathing.   Cardiovascular: Negative for chest pain and palpitations.  Gastrointestinal: Negative for blood in stool, constipation and diarrhea.  Endocrine: Negative for increased urination.  Genitourinary: Negative for difficulty urinating.  Musculoskeletal: Positive for arthralgias, joint pain, joint swelling and morning stiffness. Negative for myalgias, muscle tenderness and myalgias.  Skin: Positive for color change. Negative for rash and redness.  Allergic/Immunologic:  Negative for susceptible to infections.  Neurological: Positive for dizziness. Negative for numbness, headaches, memory loss and weakness.  Hematological: Positive for bruising/bleeding tendency.  Psychiatric/Behavioral: Negative for confusion.    PMFS History:  Patient Active Problem List   Diagnosis Date Noted  . 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 Gasping for breath 05/10/2018  . Bursitis, ischial, right 10/08/2016  . 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 Fibromyalgia 10/08/2016  . Other fatigue 10/08/2016  . DDD (degenerative disc disease), lumbar/ and scoliosis 10/08/2016  . Facial droop   . 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 Prolonged QT interval 07/28/2016  . Primary osteoarthritis of both hands 05/11/2016  . Primary osteoarthritis of both knees 05/11/2016  . History of hypothyroidism 05/11/2016  . History of migraine 05/11/2016  . 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 ANA positive 04/29/2016  . Rheumatoid factor positive 04/29/2016  . Pseudogout 04/29/2016  . Chondrocalcinosis 04/29/2016  . High risk medication use 04/29/2016  . Sjogren's syndrome with keratoconjunctivitis sicca ([place]) 04/29/2016  . History of neutropenia 04/29/2016  . 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  . Hyponatremia 12/12/2013  . Fever 12/13/2012  . Pleuritic chest pain 12/06/2012  . 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 Anxiety 12/06/2012  . ANEMIA-IRON DEFICIENCY 01/08/2010  . GASTRIC DILATION, ACUTE 01/08/2010  . NAUSEA ALONE 01/08/2010  . ABDOMINAL PAIN, LEFT UPPER QUADRANT 01/08/2010    Past Medical History:  Diagnosis Date  . Anemia   . Arthritis   . Asthma   .  Diabetes insipidus ([place])   . Fibromyalgia   . Lupus ([place])   . Mitral valve prolapse   . SLE (systemic lupus erythematosus) ([place]) 12/06/2012  . Thyroid disease     Family  History  Problem Relation Age of Onset  . Diabetes Father   . Heart disease Father   . Hyperlipidemia Father   . Hypertension Father   . Stroke Father   . Diabetes Sister   . Kidney disease Sister        transplant  . Heart disease Sister        open heart surgery   . Pancreatic disease Sister        transplant   . Cancer Maternal Grandmother   . Diabetes Paternal Grandfather   . Heart disease Paternal Grandfather   . Hyperlipidemia Paternal Grandfather   . Hypertension Paternal Grandfather   . Healthy Daughter   . Healthy Son    Past Surgical History:  Procedure Laterality Date  . PITUITARY EXCISION    . TONSILLECTOMY     Social History   Social History Narrative  . Not on file   Immunization History  Administered Date(s) Administered  . Influenza-Unspecified 09/28/2018     Objective: Vital Signs: BP 121/85 (BP Location: Left Arm, Patient Position: Sitting, Cuff Size: Normal)   Pulse 81   Resp 13   Ht 5' 6" (1.676 m)   Wt 145 lb 3.2 oz (65.9 kg)   LMP 04/09/2014 (Approximate)   BMI 23.44 kg/m    Physical Exam Vitals and nursing note reviewed.  Constitutional:      Appearance: She is well-developed.  HENT:     Head: Normocephalic and atraumatic.  Eyes:     Conjunctiva/sclera: Conjunctivae normal.  Cardiovascular:     Rate and Rhythm: Normal rate and regular rhythm.     Heart sounds: Normal heart sounds.  Pulmonary:     Effort: Pulmonary effort is normal.     Breath sounds: Normal breath sounds.  Abdominal:     General: Bowel sounds are normal.     Palpations: Abdomen is soft.  Musculoskeletal:     Cervical back: Normal range of motion.  Lymphadenopathy:     Cervical: No cervical adenopathy.  Skin:    General: Skin is warm and dry.     Capillary Refill: Capillary refill takes less than 2 seconds.  Neurological:     Mental Status: She is alert and oriented to person, place, and time.  Psychiatric:        Behavior: Behavior normal.       Musculoskeletal Exam: C-spine thoracic and lumbar spine with good range of motion.  Shoulder joints, elbow joints, wrist joints with good range of motion.  She has bilateral PIP DIP and CMC thickening consistent with osteoarthritis.  Hip joints in good range of motion.  She has warmth and swelling in her right knee joint and left ankle joint.  CDAI Exam: CDAI Score: -- Patient Global: --; Provider Global: -- Swollen: --; Tender: -- Joint Exam 11/09/2019   No joint exam has been documented for this visit   There is currently no information documented on the homunculus. Go to the Rheumatology activity and complete the homunculus joint exam.  Investigation: No additional findings.  Imaging: No results found.  Recent Labs: Lab Results  Component Value Date   WBC 4.3 10/16/2019   HGB 13.1 10/16/2019   PLT 226 10/16/2019   NA 139 10/16/2019   K 3.8 10/16/2019   CL  102 10/16/2019   CO2 26 10/16/2019   GLUCOSE 78 10/16/2019   BUN 13 10/16/2019   CREATININE 0.75 10/16/2019   BILITOT 0.8 10/16/2019   ALKPHOS 97 07/30/2016   AST 26 10/16/2019   ALT 19 10/16/2019   PROT 8.0 10/16/2019   PROT 8.1 10/16/2019   ALBUMIN 3.6 07/30/2016   CALCIUM 9.9 10/16/2019   GFRAA 101 10/16/2019   QFTBGOLDPLUS NEGATIVE 10/16/2019  October 16, 2019 UA negative, SPEP normal, immunoglobulins normal IgA mildly elevated, TB Gold negative, hepatitis B-, hepatitis C negative, HIV negative, TPMT 17 normal, anticardiolipin negative, C3-C4 normal, ESR 29, SSA positive (SSB negative, dsDNA negative), RF 18  Speciality Comments: PLQ Eye Exam 02/22/2019 WNL @ [facility] Ophthalmology follow up in 1 year  Procedures:  No procedures performed Allergies: Iron sucrose, Morphine, Venofer [ferric oxide], Azithromycin, and Other   Assessment / Plan:     Visit Diagnoses: Sjogren's syndrome with keratoconjunctivitis sicca ([facility]) - Positive ANA, positive Ro, positive La, positive RF sicca symptoms, inflammatory  arthritis.  She has been having increased pain in swelling in her joints.  She has discomfort in her right knee joint and left ankle joint.  She states her ankle joints have been hurting for the last 2 weeks and she has been taking Tylenol.  We had detailed discussion regarding adding Imuran.  We had the discussion at last visit as well.  Indications side effects contraindications were discussed at length.  I discussed restarting her at 50 mg p.o. daily if tolerated we can increase it further.  We will check labs in 2 weeks and then in 2 months.  She is hesitant to increase the dose further at this point.  After detailed discussion patient stated that she would like to start Imuran after she finishes the course of COVID-19 vaccines.  She wants to start COVID-19 vaccination next week.  Will contact us regarding Imuran when she is ready.  Medication counseling:  TPMT: October 16, 2019  Patient was counseled on the purpose, proper use, and adverse effects of azathioprine including risk of infection, nausea, rash, and hair loss.  Reviewed risk of cancer after long term use.  Discussed risk of myelosupression and reviewed importance of frequent lab work to monitor blood counts.  Reviewed drug-drug interactions including contraindication with allopurinol.  Provided patient with educational materials on azathioprine and answered all questions.  Patient consented to azathioprine.  Will upload consent into the media tab.    High risk medication use - Plaquenil 200 mg 1 tablet in the morning and 1/2 tablet in the evening. PLQ Eye Exam 02/22/2019 . labs obtained in September were discussed with the patient.  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  She was evaluated by Dr. Vaughan Browner and was advised to get repeat CT scan in 1 year.  I called Dr. Vaughan Browner to discussed her situation.  He said that she has Sjogren's associated ILD.  He recommended she should be started on Imuran or  CellCept.  Pain in right ankle and joints of right foot - MRI 12/23/2017 MRI of right ankle revealed insertional achilles tendinosis with retrocalcaneal bursitis.  She continues to have some discomfort in her ankle joints.  Her left ankle joint was swollen today.  Primary osteoarthritis of both hands-she has osteoarthritis involving her bilateral hands and her CMC joints.  Primary osteoarthritis of both knees-she has warmth and swelling in her right knee joint today.  Chondromalacia patellae, right knee  Chondrocalcinosis - colchicine 0.6 mg  1 capsule by mouth daily.  Cervical spondylosis without myelopathy-she has chronic neck discomfort.  She has been experiencing right-sided radiculopathy.  She states that she has a scheduled an appointment with Dr. Jannifer Franklin.  Fibromyalgia-she continues to have generalized pain and discomfort.  Stretching exercises were emphasized.  Other fatigue-secondary to fibromyalgia.  History of migraine  History of anxiety  History of diabetes insipidus  History of hypothyroidism  Prolonged QT interval-followed by cardiology.  Patient has not been vaccinated for COVID-19.  Risk of not not being immunized was discussed.  Use of mask, social distancing and hand hygiene was discussed.  Instructions were placed in the AVS.  Orders: No orders of the defined types were placed in this encounter.  No orders of the defined types were placed in this encounter.   Follow-Up Instructions: Return for Sjogren's, Osteoarthritis,CPPD.   Bo Merino, MD  Note - This record has been created using Editor, commissioning.  Chart creation errors have been sought, but may not always  have been located. Such creation errors do not reflect on  the standard of medical care.

## 2019-11-08 ENCOUNTER — Other Ambulatory Visit: Payer: Self-pay | Admitting: Rheumatology

## 2019-11-09 ENCOUNTER — Ambulatory Visit: Payer: 59 | Admitting: Rheumatology

## 2019-11-09 ENCOUNTER — Other Ambulatory Visit: Payer: Self-pay

## 2019-11-09 ENCOUNTER — Encounter: Payer: Self-pay | Admitting: Rheumatology

## 2019-11-09 ENCOUNTER — Telehealth: Payer: Self-pay | Admitting: Rheumatology

## 2019-11-09 VITALS — BP 121/85 | HR 81 | Resp 13 | Ht 66.0 in | Wt 145.2 lb

## 2019-11-09 DIAGNOSIS — M25571 Pain in right ankle and joints of right foot: Secondary | ICD-10-CM

## 2019-11-09 DIAGNOSIS — M1120 Other chondrocalcinosis, unspecified site: Secondary | ICD-10-CM

## 2019-11-09 DIAGNOSIS — M47812 Spondylosis without myelopathy or radiculopathy, cervical region: Secondary | ICD-10-CM

## 2019-11-09 DIAGNOSIS — Z79899 Other long term (current) drug therapy: Secondary | ICD-10-CM

## 2019-11-09 DIAGNOSIS — M19042 Primary osteoarthritis, left hand: Secondary | ICD-10-CM

## 2019-11-09 DIAGNOSIS — R5383 Other fatigue: Secondary | ICD-10-CM

## 2019-11-09 DIAGNOSIS — Z8669 Personal history of other diseases of the nervous system and sense organs: Secondary | ICD-10-CM

## 2019-11-09 DIAGNOSIS — R9431 Abnormal electrocardiogram [ECG] [EKG]: Secondary | ICD-10-CM

## 2019-11-09 DIAGNOSIS — J842 Lymphoid interstitial pneumonia: Secondary | ICD-10-CM

## 2019-11-09 DIAGNOSIS — M797 Fibromyalgia: Secondary | ICD-10-CM

## 2019-11-09 DIAGNOSIS — M170 Bilateral primary osteoarthritis of knee: Secondary | ICD-10-CM

## 2019-11-09 DIAGNOSIS — M2241 Chondromalacia patellae, right knee: Secondary | ICD-10-CM

## 2019-11-09 DIAGNOSIS — M359 Systemic involvement of connective tissue, unspecified: Secondary | ICD-10-CM

## 2019-11-09 DIAGNOSIS — Z8659 Personal history of other mental and behavioral disorders: Secondary | ICD-10-CM

## 2019-11-09 DIAGNOSIS — Z8639 Personal history of other endocrine, nutritional and metabolic disease: Secondary | ICD-10-CM

## 2019-11-09 DIAGNOSIS — M3501 Sicca syndrome with keratoconjunctivitis: Secondary | ICD-10-CM

## 2019-11-09 DIAGNOSIS — M19041 Primary osteoarthritis, right hand: Secondary | ICD-10-CM

## 2019-11-09 NOTE — Patient Instructions (Addendum)
Standing Labs We placed an order today for your standing lab work.   Please have your standing labs drawn in 2 weeks and then 2 months.  If possible, please have your labs drawn 2 weeks prior to your appointment so that the provider can discuss your results at your appointment.  We have open lab daily Monday through Thursday from 8:30-12:30 PM and 1:30-4:30 PM and Friday from 8:30-12:30 PM and 1:30-4:00 PM at the office of Dr. Pollyann Savoy, [facility].   Please be advised, patients with office appointments requiring lab work will take precedents over walk-in lab work.  If possible, please come for your lab work on Monday and Friday afternoons, as you may experience shorter wait times. The office is located at [address], [address], [place], [place] [address] No appointment is necessary.   Labs are drawn by [facility]. Please bring your co-pay at the time of your lab draw.  You may receive a bill from [facility] for your lab work.  If you wish to have your labs drawn at another location, please call the office 24 hours in advance to send orders.  If you have any questions regarding directions or hours of operation,  please call [phone].   As a reminder, please drink plenty of water prior to coming for your lab work. Thanks!    Azathioprine injection What is this medicine? AZATHIOPRINE (ay za THYE oh preen) suppresses the immune system. It is used to prevent organ rejection after a transplant. It is also used to treat rheumatoid arthritis. The injection is given only when the medicine can not be taken by mouth. This medicine may be used for other purposes; ask your health care provider or pharmacist if you have questions. COMMON BRAND NAME(S): Imuran What should I tell my health care provider before I take this medicine? They need to know if you have any of these conditions:  infection  kidney disease  liver disease  an unusual or allergic reaction to  azathioprine, other medicines, lactose, foods, dyes, or preservatives  pregnant or trying to get pregnant  breast feeding How should I use this medicine? This medicine is for injection or infusion into a vein. It is given by a health care professional in a hospital or clinic setting. Talk to your pediatrician regarding the use of this medicine in children. Special care may be needed. Overdosage: If you think you have taken too much of this medicine contact a poison control center or emergency room at once. NOTE: This medicine is only for you. Do not share this medicine with others. What if I miss a dose? This does not apply. What may interact with this medicine? Do not take this medicine with any of the following medications:  febuxostat  mercaptopurine This medicine may also interact with the following medications:  allopurinol  aminosalicylates like sulfasalazine, mesalamine, balsalazide, and olsalazine  leflunomide  medicines called ACE inhibitors like benazepril, captopril, enalapril, fosinopril, quinapril, lisinopril, ramipril, and trandolapril  mycophenolate  sulfamethoxazole; trimethoprim  vaccines  warfarin This list may not describe all possible interactions. Give your health care provider a list of all the medicines, herbs, non-prescription drugs, or dietary supplements you use. Also tell them if you smoke, drink alcohol, or use illegal drugs. Some items may interact with your medicine. What should I watch for while using this medicine? Your condition will be monitored carefully while you are receiving this medicine. This medicine may increase your risk of getting an infection. Stay away from  people who are sick. See your doctor if you get an infection. Women should inform their doctor if they wish to become pregnant or think they might be pregnant. There is a potential for serious side effects to an unborn child. Talk to your health care professional or pharmacist  for more information. Men may have a reduced sperm count while they are taking this medicine. Talk to your health care professional for more information. This medicine may increase your risk of getting certain kinds of cancer. Talk to your doctor about healthy lifestyle choices, important screenings, and your risk. What side effects may I notice from receiving this medicine? Side effects that you should report to your doctor or health care professional as soon as possible:  allergic reactions like skin rash, itching or hives, swelling of the face, lips, or tongue  changes in vision  confusion  fever, chills, or any other sign of infection  loss of balance or coordination  severe stomach pain  unusual bleeding, bruising  unusually weak or tired  vomiting  yellowing of the eyes or skin Side effects that usually do not require medical attention (report to your doctor or health care professional if they continue or are bothersome):  hair loss  nausea This list may not describe all possible side effects. Call your doctor for medical advice about side effects. You may report side effects to FDA at 1-800-FDA-1088. Where should I keep my medicine? This drug is given in a hospital or clinic and will not be stored at home. NOTE: This sheet is a summary. It may not cover all possible information. If you have questions about this medicine, talk to your doctor, pharmacist, or health care provider.  2020 Elsevier/Gold Standard (2013-06-20 12:02:06)  COVID-19 vaccine recommendations:   COVID-19 vaccine is recommended for everyone (unless you are allergic to a vaccine component), even if you are on a medication that suppresses your immune system.   If you are on Methotrexate, Cellcept (mycophenolate), Rinvoq, Harriette Ohara, and Olumiant- hold the medication for 1 week after each vaccine. Hold Methotrexate for 2 weeks after the single dose COVID-19 vaccine.   If you are on Orencia subcutaneous  injection - hold medication one week prior to and one week after the first COVID-19 vaccine dose (only).   If you are on Orencia IV infusions- time vaccination administration so that the first COVID-19 vaccination will occur four weeks after the infusion and postpone the subsequent infusion by one week.   If you are on Cyclophosphamide or Rituxan infusions please contact your doctor prior to receiving the COVID-19 vaccine.   Do not take Tylenol or any anti-inflammatory medications (NSAIDs) 24 hours prior to the COVID-19 vaccination.   There is no direct evidence about the efficacy of the COVID-19 vaccine in individuals who are on medications that suppress the immune system.   Even if you are fully vaccinated, and you are on any medications that suppress your immune system, please continue to wear a mask, maintain at least six feet social distance and practice hand hygiene.   If you develop a COVID-19 infection, please contact your PCP or our office to determine if you need antibody infusion.  The booster vaccine is now available for immunocompromised patients. It is advised that if you had Pfizer vaccine you should get [employer] booster.  If you had a Moderna vaccine then you should get a Moderna booster. Johnson and Laural Benes does not have a booster vaccine at this time.  Please see the following web  sites for updated information.   [email or web address]/[email or web address]-[email or web address]-Patient-Resources.[email or web address]  [email or web address]/[email or web address]

## 2019-11-09 NOTE — Telephone Encounter (Signed)
Patient's son has been tested for COVID three times now due to symptoms he started having. Patient started feeling bad las Thursday, tested Friday. All three tests have been negative. Patient has no symptoms. Does she need to keep her follow up appointment this pm? Please call patient by 2:00 pm  to advise.

## 2019-11-09 NOTE — Telephone Encounter (Signed)
Patient advised she may come to her appointment at 2:00 pm.

## 2019-11-11 ENCOUNTER — Other Ambulatory Visit: Payer: Self-pay | Admitting: Rheumatology

## 2019-11-11 DIAGNOSIS — M359 Systemic involvement of connective tissue, unspecified: Secondary | ICD-10-CM

## 2019-11-14 NOTE — Telephone Encounter (Signed)
Last Visit: 11/09/2019 Next Visit: 01/11/2020 Labs: 10/16/2019 CBC and CMP WNL. Eye exam: 02/22/2019 WNL   Current Dose per office note 11/09/2019: Plaquenil 200 mg 1 tablet in the morning and 1/2 tablet in the evening DX: Sjogren's syndrome with keratoconjunctivitis sicca   Okay to refill per Dr. Corliss Skains

## 2019-11-15 ENCOUNTER — Ambulatory Visit: Payer: 59 | Admitting: Cardiology

## 2019-11-15 ENCOUNTER — Encounter: Payer: Self-pay | Admitting: Cardiology

## 2019-11-15 ENCOUNTER — Other Ambulatory Visit: Payer: Self-pay

## 2019-11-15 VITALS — BP 110/73 | HR 82 | Resp 16 | Ht 66.0 in | Wt 145.0 lb

## 2019-11-15 DIAGNOSIS — R002 Palpitations: Secondary | ICD-10-CM

## 2019-11-15 DIAGNOSIS — I361 Nonrheumatic tricuspid (valve) insufficiency: Secondary | ICD-10-CM

## 2019-11-15 DIAGNOSIS — R079 Chest pain, unspecified: Secondary | ICD-10-CM

## 2019-11-15 DIAGNOSIS — Z712 Person consulting for explanation of examination or test findings: Secondary | ICD-10-CM

## 2019-11-15 DIAGNOSIS — I340 Nonrheumatic mitral (valve) insufficiency: Secondary | ICD-10-CM

## 2019-11-15 NOTE — Progress Notes (Signed)
Stefanie [name] Date of Birth: [birth year] MRN: [id number] Primary Care Provider:Russo, Jonny RuizJohn, MD Former Cardiology Providers: Altamese CarolinaAshton Kelley, APRN, FNP-C Primary Cardiologist: Tessa LernerSunit Kailyn Dubie, DO, [facility] (established care 11/15/2019)  Date: 11/15/19 Last Visit: 10/11/2018  Chief Complaint  Patient presents with   Chest Pain   Follow-up    12 month    HPI  Stefanie [name] is a 59 y.o.  female who presents to the office with a chief complaint of " chest pain." Patient's past medical history and cardiovascular risk factors include: History of fibromyalgia, rheumatoid arthritis with positive ANA and rheumatoid factor, keratoconjunctivitis silica, lymphoid interstitial pneumonitis, CPPD, postmenopausal female.  Patient was formally under the care of Stefanie [name] who was seeing her for the management of palpitations and effort related dyspnea.  She is here for her 1 year follow-up.  Over the last 1 year patient states that she is doing well from a cardiovascular standpoint.  However, since this past Sunday she has been having some chest discomfort.  Patient states that the chest discomfort is throughout her anterior and lateral chest wall.  The pain is 6 out of 10, intermittent, nonpleuritic, nonpositional, localized predominantly over the mid axillary line and under the left breast up to the xiphoid process.  Now brought on by effort related activities nor does it improve with rest.  Patient states that she is very active with walking at work and has not noted worsening of symptoms with such activities.  Patient does not participate in any regular exercise program or daily routine due to her underlying arthritis.  Since last office visit patient had an echocardiogram which noted preserved left ventricular systolic function and moderate mitral regurgitation.  Her last stress test was in 2018 findings noted below.  ALLERGIES: Allergies  Allergen Reactions   Iron Sucrose Rash    Chest pain    Morphine     Other reaction(s): Vomiting   Venofer [Ferric Oxide] Palpitations    Dizziness, sweating, "felt like I was going to pass out" unknown when episode happened, itching   Azithromycin Other (See Comments)    "couldnt sleep"   Other     Other reaction(s): Other (See Comments) Magic mouth wash. Caused tongue to have a burning feeling     MEDICATION LIST PRIOR TO VISIT: Current Outpatient Medications on File Prior to Visit  Medication Sig Dispense Refill   albuterol (PROVENTIL HFA;VENTOLIN HFA) 108 (90 BASE) MCG/ACT inhaler Inhale 1 puff into the lungs every 6 (six) hours as needed for wheezing or shortness of breath.     Ascorbic Acid (VITAMIN C) 1000 MG tablet Take 1,000 mg by mouth daily.     colchicine 0.6 MG tablet Take 0.6 mg by mouth daily.     hydroxychloroquine (PLAQUENIL) 200 MG tablet TAKE 1 TABLET BY MOUTH IN THE MORNING AND 1/2 TABLET EVERY EVENING 135 tablet 0   levothyroxine (SYNTHROID, LEVOTHROID) 125 MCG tablet Take 125 mcg by mouth at bedtime.      MITIGARE 0.6 MG CAPS TAKE 1 CAPSULE BY MOUTH EVERY DAY 90 capsule 0   rizatriptan (MAXALT) 10 MG tablet Take 10 mg by mouth daily as needed for migraine. May repeat in 2 hours if needed     Turmeric 450 MG CAPS Take by mouth daily.      No current facility-administered medications on file prior to visit.    PAST MEDICAL HISTORY: Past Medical History:  Diagnosis Date   Anemia    Arthritis    Asthma  Diabetes insipidus (HCC)    Fibromyalgia    Lupus (HCC)    Mitral valve prolapse    SLE (systemic lupus erythematosus) (HCC) 12/06/2012   Thyroid disease     PAST SURGICAL HISTORY: Past Surgical History:  Procedure Laterality Date   PITUITARY EXCISION     TONSILLECTOMY      FAMILY HISTORY: The patient's family history includes Cancer in her maternal grandmother; Diabetes in her father, paternal grandfather, and sister; Healthy in her daughter and son; Heart disease in her father,  paternal grandfather, and sister; Hyperlipidemia in her father and paternal grandfather; Hypertension in her father and paternal grandfather; Kidney disease in her sister; Pancreatic disease in her sister; Stroke in her father.   SOCIAL HISTORY:  The patient  reports that she has never smoked. She has never used smokeless tobacco. She reports that she does not drink alcohol and does not use drugs.  Review of Systems  Constitutional: Negative for chills and fever.  HENT: Negative for hoarse voice and nosebleeds.   Eyes: Negative for discharge, double vision and pain.  Cardiovascular: Positive for chest pain (see above. ). Negative for claudication, dyspnea on exertion, leg swelling, near-syncope, orthopnea, palpitations, paroxysmal nocturnal dyspnea and syncope.  Respiratory: Positive for shortness of breath (chronic and stable. ). Negative for hemoptysis.   Musculoskeletal: Positive for arthritis, back pain, joint pain and stiffness. Negative for muscle cramps and myalgias.  Gastrointestinal: Negative for abdominal pain, constipation, diarrhea, hematemesis, hematochezia, melena, nausea and vomiting.  Neurological: Negative for dizziness and light-headedness.    PHYSICAL EXAM: Vitals with BMI 11/15/2019 11/09/2019 10/16/2019  Height 5\' 6"  5\' 6"  5\' 6"   Weight 145 lbs 145 lbs 3 oz 144 lbs 13 oz  BMI 23.41 23.45 23.38  Systolic 110 121  Diastolic 73 85 70  Pulse 82 81 76    CONSTITUTIONAL: Well-developed and well-nourished. No acute distress.  SKIN: Skin is warm and dry. No rash noted. No cyanosis. No pallor. No jaundice HEAD: Normocephalic and atraumatic.  EYES: No scleral icterus MOUTH/THROAT: Moist oral membranes.  NECK: No JVD present. No thyromegaly noted. No carotid bruits  LYMPHATIC: No visible cervical adenopathy.  CHEST Normal respiratory effort. No intercostal retractions  LUNGS: Clear to auscultation bilaterally. No stridor. No wheezes. No rales.  CARDIOVASCULAR: Regular rate  and rhythm, positive S1-S2, Holosystolic murmur heard at the apex, no rubs or gallops appreciated. ABDOMINAL: No apparent ascites.  EXTREMITIES: No peripheral edema  HEMATOLOGIC: No significant bruising NEUROLOGIC: Oriented to person, place, and time. Nonfocal. Normal muscle tone.  PSYCHIATRIC: Normal mood and affect. Normal behavior. Cooperative  CARDIAC DATABASE: EKG: 04/28/2019: Normal sinus rhythm at 76 bpm, normal axis, no evidence of ischemia. Low voltage complexes. 11/15/2019: Sinus  Rhythm, 86bpm, normal axis, occasional PAC, old anteroseptal infarct, without injury pattern.   Echocardiogram: Echocardiogram 04/07/2019: Left ventricle cavity is normal in size and wall thickness. Normal global wall motion. Normal LV systolic function with EF 61%. Normal diastolic filling pattern. Mild prolapse of the mitral valve leaflets. Moderate (Grade III) mitral regurgitation. Moderate to severe tricuspid regurgitation. Estimated pulmonary artery systolic pressure is 27 mmHg. Compared to prior echo 05/08/2016: LVEF 55-60%, normal diastolic function, mild to moderate MR, mild TR   Stress Testing:  Lexiscan 05/08/2016:  Nuclear stress EF: 64%.  There was no ST segment deviation noted during stress.  Defect 1: There is a medium defect of moderate severity present in the mid anterior and apical anterior location.  The study is normal.  This is  a low risk study.  The left ventricular ejection fraction is normal (55-65%)  Low risk stress nuclear study with breast attenuation; no ischemia; EF 64 with normal wall motion.  Heart Catheterization: None  30-day event monitor 6/17-7/16/2020: Normal sinus rhythm. One auto detected event occurred on day 30 at 1647 for sinus tachycardia at 149 bpm. No patient triggered events occurred. Fastest heart rate was 149 bpm. No A. fib or SVT was noted.  LABORATORY DATA: CBC Latest Ref Rng & Units 10/16/2019 06/01/2019 08/29/2018  WBC 3.8 - 10.8 Thousand/uL 4.3  4.2 5.2  Hemoglobin 11.7 - 15.5 g/dL 42.5 95.6 38.7  Hematocrit 35 - 45 % 38.7 41.5 40.2  Platelets 140 - 400 Thousand/uL 226 247 224    CMP Latest Ref Rng & Units 10/16/2019 10/16/2019 06/01/2019  Glucose 65 - 99 mg/dL - 78 90  BUN 7 - 25 mg/dL - 13 15  Creatinine 5.64 - 1.05 mg/dL - 3.32 9.51  Sodium 884 - 146 mmol/L - 139 138  Potassium 3.5 - 5.3 mmol/L - 3.8 3.9  Chloride 98 - 110 mmol/L - 102 102  CO2 20 - 32 mmol/L - 26 28  Calcium 8.6 - 10.4 mg/dL - 9.9 9.9  Total Protein 6.1 - 8.1 g/dL 8.1 8.0 7.5  Total Bilirubin 0.2 - 1.2 mg/dL - 0.8 0.6  Alkaline Phos 38 - 126 U/L - - -  AST 10 - 35 U/L - 26 29  ALT 6 - 29 U/L - 19 25   Cardiac Panel (last 3 results) No results for input(s): CKTOTAL, CKMB, TROPONINIHS, RELINDX in the last 72 hours.  IMPRESSION:    ICD-10-CM   1. Chest pain, unspecified type  R07.9 EKG 12-Lead    PCV MYOCARDIAL PERFUSION WO LEXISCAN  2. Moderate mitral regurgitation  I34.0   3. Nonrheumatic tricuspid valve regurgitation  I36.1   4. Encounter to discuss test results  Z71.2   5. Palpitations  R00.2      RECOMMENDATIONS: Kyeisha Janowicz is a 59 y.o. 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  Precordial chest pain:  Patient symptoms of chest discomfort appear to be atypical in nature as discussed above.  EKG shows normal sinus rhythm without underlying ischemia or injury pattern.  Patient symptoms may be musculoskeletal in origin.  Patient is recommended to possibly take low doses of NSAIDs and alternating it with Tylenol on as needed basis to see if her symptoms improve.  She recently had an echocardiogram which noted preserved left ventricular systolic function as well.  If symptoms persist despite as needed use of NSAIDs/Tylenol recommended  exercise nuclear stress test to evaluate for exercise-induced ischemia.    Patient will need a Covid screen prior to exercise nuclear stress test as she has not been vaccinated yet.  Patient states that she has plans of being vaccinated in the coming weeks.  Moderate mitral regurgitation: Continue to monitor.  Last echocardiogram January 2021.   Palpitations: Chronic and stable.  FINAL MEDICATION LIST END OF ENCOUNTER: No orders of the defined types were placed in this encounter.   There are no discontinued medications.   Current Outpatient Medications:    albuterol (PROVENTIL HFA;VENTOLIN HFA) 108 (90 BASE) MCG/ACT inhaler, Inhale 1 puff into the lungs every 6 (six) hours as needed for wheezing or shortness of breath., Disp: , Rfl:    Ascorbic Acid (VITAMIN  C) 1000 MG tablet, Take 1,000 mg by mouth daily., Disp: , Rfl:    colchicine 0.6 MG tablet, Take 0.6 mg by mouth daily., Disp: , Rfl:    hydroxychloroquine (PLAQUENIL) 200 MG tablet, TAKE 1 TABLET BY MOUTH IN THE MORNING AND 1/2 TABLET EVERY EVENING, Disp: 135 tablet, Rfl: 0   levothyroxine (SYNTHROID, LEVOTHROID) 125 MCG tablet, Take 125 mcg by mouth at bedtime. , Disp: , Rfl:    MITIGARE 0.6 MG CAPS, TAKE 1 CAPSULE BY MOUTH EVERY DAY, Disp: 90 capsule, Rfl: 0   rizatriptan (MAXALT) 10 MG tablet, Take 10 mg by mouth daily as needed for migraine. May repeat in 2 hours if needed, Disp: , Rfl:    Turmeric 450 MG CAPS, Take by mouth daily. , Disp: , Rfl:   Orders Placed This Encounter  Procedures   PCV MYOCARDIAL PERFUSION WO LEXISCAN   EKG 12-Lead   --Continue cardiac medications as reconciled in final medication list. --Return in about 3 months (around 02/14/2020) for Reevaluation of MR and palpitations. . Or sooner if needed. --Continue follow-up with your primary care physician regarding the management of your other chronic comorbid conditions.  Patient's questions and concerns were addressed to her satisfaction. She  voices understanding of the instructions provided during this encounter.   This note was created using a voice recognition software as a result there may be grammatical errors inadvertently enclosed that do not reflect the nature of this encounter. Every attempt is made to correct such errors.  Tessa Lerner, [place], [facility]  Pager: [phone] Office: [phone]

## 2019-12-29 NOTE — Progress Notes (Signed)
Office Visit Note  Patient: Stefanie [name]             Date of Birth: Jun 19, 1960           MRN: [id number]             PCP: Creola Corn, MD Referring: Creola Corn, MD Visit Date: 01/11/2020 Occupation: @GUAROCC @  Subjective:  Medication management.   History of Present Illness: Stefanie [name] is a 59 y.o. female with history of Sjogren's and interstitial lung disease.  She states she continues to have sicca symptoms.  She was evaluated by 45 and was advised that she has interstitial lung disease.  I also had a conversation with Dr. Elisha Headland at her last visit he recommended starting her on Imuran or CellCept.  Patient decided to wait until she gets her Covid vaccination.  She just finished getting her initial Covid vaccination a month ago.  She continues to have pain and discomfort in her joints.  She complains of pain and discomfort in her bilateral hands and her knee joints.  She states her left knee appears to be swollen.  She continues to have some shortness of breath on exertion.  She continues to have some generalized discomfort from fibromyalgia.  She has not had a flare of pseudogout.  Although her knee joints continue to hurt.  Activities of Daily Living:  Patient reports morning stiffness for 1 hour.   Patient Reports nocturnal pain.  Difficulty dressing/grooming: Denies Difficulty climbing stairs: Reports Difficulty getting out of chair: Reports Difficulty using hands for taps, buttons, cutlery, and/or writing: Reports  Review of Systems  Constitutional: Positive for fatigue.  HENT: Positive for mouth dryness. Negative for mouth sores and nose dryness.   Eyes: Positive for dryness. Negative for pain and itching.  Respiratory: Positive for cough and shortness of breath. Negative for difficulty breathing.   Cardiovascular: Negative for chest pain and palpitations.  Gastrointestinal: Negative for blood in stool, constipation and diarrhea.  Endocrine: Negative  for increased urination.  Genitourinary: Negative for difficulty urinating.  Musculoskeletal: Positive for arthralgias, joint pain, joint swelling, myalgias, morning stiffness, muscle tenderness and myalgias.  Skin: Positive for color change, rash and redness.  Allergic/Immunologic: Negative for susceptible to infections.  Neurological: Positive for numbness. Negative for dizziness, headaches, memory loss and weakness.  Hematological: Positive for bruising/bleeding tendency.  Psychiatric/Behavioral: Positive for decreased concentration. Negative for confusion.    PMFS History:  Patient Active Problem List   Diagnosis Date Noted  . 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 Gasping for breath 05/10/2018  . Bursitis, ischial, right 10/08/2016  . 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 Fibromyalgia 10/08/2016  . Other fatigue 10/08/2016  . DDD (degenerative disc disease), lumbar/ and scoliosis 10/08/2016  . Facial droop   . 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 Prolonged QT interval 07/28/2016  . Primary osteoarthritis of both hands 05/11/2016  . Primary osteoarthritis of both knees 05/11/2016  . History of hypothyroidism 05/11/2016  . History of migraine 05/11/2016  . 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 ANA positive 04/29/2016  . Rheumatoid factor positive 04/29/2016  . Pseudogout 04/29/2016  . Chondrocalcinosis 04/29/2016  . High risk medication use 04/29/2016  . Sjogren's syndrome with keratoconjunctivitis sicca (HCC) 04/29/2016  . History of neutropenia 04/29/2016  . 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  . Hyponatremia 12/12/2013  . Fever 12/13/2012  . Pleuritic chest pain 12/06/2012  .  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 Anxiety 12/06/2012  . ANEMIA-IRON DEFICIENCY 01/08/2010  . GASTRIC DILATION, ACUTE 01/08/2010  . NAUSEA ALONE  01/08/2010  . ABDOMINAL PAIN, LEFT UPPER QUADRANT 01/08/2010    Past Medical History:  Diagnosis Date  . Anemia   . Arthritis   . Asthma   . Diabetes insipidus (HCC)   . Fibromyalgia   . Lupus (HCC)   . Mitral valve prolapse   . SLE (systemic lupus erythematosus) (HCC) 12/06/2012  . Thyroid disease     Family History  Problem Relation Age of Onset  . Diabetes Father   . Heart disease Father   . Hyperlipidemia Father   . Hypertension Father   . Stroke Father   . Diabetes Sister   . Kidney disease Sister        transplant  . Heart disease Sister        open heart surgery   . Pancreatic disease Sister        transplant   . Cancer Maternal Grandmother   . Diabetes Paternal Grandfather   . Heart disease Paternal Grandfather   . Hyperlipidemia Paternal Grandfather   . Hypertension Paternal Grandfather   . Healthy Daughter   . Healthy Son    Past Surgical History:  Procedure Laterality Date  . PITUITARY EXCISION    . TONSILLECTOMY     Social History   Social History Narrative  . Not on file   Immunization History  Administered Date(s) Administered  . Influenza-Unspecified 09/28/2018  . PFIZER SARS-COV-2 Vaccination 11/17/2019, 12/13/2019     Objective: Vital Signs: BP 118/81 (BP Location: Left Arm, Patient Position: Sitting, Cuff Size: Normal)   Pulse 77   Resp 13   Ht 5\' 6"  (1.676 m)   Wt 144 lb (65.3 kg)   LMP 04/09/2014 (Approximate)   BMI 23.24 kg/m    Physical Exam Vitals and nursing note reviewed.  Constitutional:      Appearance: She is well-developed.  HENT:     Head: Normocephalic and atraumatic.  Eyes:     Conjunctiva/sclera: Conjunctivae normal.  Cardiovascular:     Rate and Rhythm: Normal rate and regular rhythm.     Heart sounds: Normal heart sounds.  Pulmonary:     Effort: Pulmonary effort is normal.     Breath sounds: Normal breath sounds.  Abdominal:     General: Bowel sounds are normal.     Palpations: Abdomen is soft.    Musculoskeletal:     Cervical back: Normal range of motion.  Lymphadenopathy:     Cervical: No cervical adenopathy.  Skin:    General: Skin is warm and dry.     Capillary Refill: Capillary refill takes less than 2 seconds.  Neurological:     Mental Status: She is alert and oriented to person, place, and time.  Psychiatric:        Behavior: Behavior normal.      Musculoskeletal Exam: She had discomfort range of motion of her cervical spine.  Shoulder joints, elbow joints, wrist joints, MCPs PIPs and DIPs with good range of motion with no synovitis.  She has some PIP and DIP thickening.  Hip joints, knee joints, ankles, MTPs and PIPs with good range of motion with no synovitis.  CDAI Exam: CDAI Score: -- Patient Global: --; Provider Global: -- Swollen: --; Tender: -- Joint Exam 01/11/2020   No joint exam has been documented for this visit   There is currently no information documented on the  homunculus. Go to the Rheumatology activity and complete the homunculus joint exam.  Investigation: No additional findings.  Imaging: No results found.  Recent Labs: Lab Results  Component Value Date   WBC 4.3 10/16/2019   HGB 13.1 10/16/2019   PLT 226 10/16/2019   NA 139 10/16/2019   K 3.8 10/16/2019   CL 102 10/16/2019   CO2 26 10/16/2019   GLUCOSE 78 10/16/2019   BUN 13 10/16/2019   CREATININE 0.75 10/16/2019   BILITOT 0.8 10/16/2019   ALKPHOS 97 07/30/2016   AST 26 10/16/2019   ALT 19 10/16/2019   PROT 8.0 10/16/2019   PROT 8.1 10/16/2019   ALBUMIN 3.6 07/30/2016   CALCIUM 9.9 10/16/2019   GFRAA 101 10/16/2019   QFTBGOLDPLUS NEGATIVE 10/16/2019    Speciality Comments: PLQ Eye Exam 02/22/2019 WNL @ [facility] follow up in 1 year  Procedures:  No procedures performed Allergies: Iron sucrose, Morphine, Venofer [ferric oxide], Azithromycin, and Other   Assessment / Plan:     Visit Diagnoses: Sjogren's syndrome with keratoconjunctivitis sicca (HCC) -  Positive ANA, positive Ro, positive La, positive RF sicca symptoms, inflammatory arthritis.  Patient complains of ongoing pain and discomfort in her joints and swelling.  I do not see any synovitis on my examination.  High risk medication use - Plaquenil 200 mg 1 tablet in the morning and 1/2 tablet in the evening. PLQ Eye Exam 02/22/2019.  We will get labs today.  She will need labs in 2 weeks after starting Imuran 50 mg p.o. daily.  If labs are stable she will increase dose of Imuran to 100 mg p.o. daily.  She will repeat labs in 2 weeks then every 2 months if labs are stable then we will check labs every 3 months.  Lymphoid interstitial pneumonia (HCC)-she is followed by Dr. Isaiah Serge.  He recommended starting her on Imuran based on her ILD.  Patient wanted to wait until she receives COVID-19 vaccination.  She received her vaccination a month ago.  Indications side effects contraindications of Imuran were again reviewed today.  I will send the prescription in for her today.  Pain in right ankle and joints of right foot-no synovitis was noted.  She is off-and-on discomfort in her ankle.  Primary osteoarthritis of both hands-she has DIP and PIP thickening but no synovitis was noted.  Primary osteoarthritis of both knees-she has ongoing discomfort in her knee joints.  No warmth swelling or effusion was noted.  Chondromalacia patellae, right knee  Chondrocalcinosis -she has been taking colchicine 0.6 mg 1 capsule by mouth daily.  She complains of joint swelling.  No warmth swelling or effusion was noted.  Cervical spondylosis without myelopathy  Fibromyalgia-she continues to have some generalized pain discomfort.  She has positive tender points.  Other fatigue-due to fibromyalgia.  Other medical problems are listed as follows:  History of migraine  History of diabetes insipidus  History of anxiety  Prolonged QT interval - followed by cardiology.  History of  hypothyroidism  Orders: Orders Placed This Encounter  Procedures  . CBC with Differential/Platelet  . COMPLETE METABOLIC PANEL WITH GFR   Meds ordered this encounter  Medications  . azaTHIOprine (IMURAN) 50 MG tablet    Sig: Take 50mg  po qd x 2 weeks, if labs are stable increase to 100mg  po qd.    Dispense:  42 tablet    Refill:  0    Follow-Up Instructions: Return in about 6 weeks (around 02/22/2020) for Sjogren's, ILD.    Estanislado Pandy, MD  Note - This record has been created using Editor, commissioning.  Chart creation errors have been sought, but may not always  have been located. Such creation errors do not reflect on  the standard of medical care.

## 2020-01-04 ENCOUNTER — Ambulatory Visit: Payer: 59 | Admitting: Neurology

## 2020-01-11 ENCOUNTER — Ambulatory Visit: Payer: 59 | Admitting: Rheumatology

## 2020-01-11 ENCOUNTER — Other Ambulatory Visit: Payer: Self-pay

## 2020-01-11 ENCOUNTER — Encounter: Payer: Self-pay | Admitting: Rheumatology

## 2020-01-11 VITALS — BP 118/81 | HR 77 | Resp 13 | Ht 66.0 in | Wt 144.0 lb

## 2020-01-11 DIAGNOSIS — M3501 Sicca syndrome with keratoconjunctivitis: Secondary | ICD-10-CM

## 2020-01-11 DIAGNOSIS — M170 Bilateral primary osteoarthritis of knee: Secondary | ICD-10-CM

## 2020-01-11 DIAGNOSIS — M47812 Spondylosis without myelopathy or radiculopathy, cervical region: Secondary | ICD-10-CM

## 2020-01-11 DIAGNOSIS — R9431 Abnormal electrocardiogram [ECG] [EKG]: Secondary | ICD-10-CM

## 2020-01-11 DIAGNOSIS — M1120 Other chondrocalcinosis, unspecified site: Secondary | ICD-10-CM

## 2020-01-11 DIAGNOSIS — R5383 Other fatigue: Secondary | ICD-10-CM

## 2020-01-11 DIAGNOSIS — Z8639 Personal history of other endocrine, nutritional and metabolic disease: Secondary | ICD-10-CM

## 2020-01-11 DIAGNOSIS — M2241 Chondromalacia patellae, right knee: Secondary | ICD-10-CM

## 2020-01-11 DIAGNOSIS — Z8659 Personal history of other mental and behavioral disorders: Secondary | ICD-10-CM

## 2020-01-11 DIAGNOSIS — M19041 Primary osteoarthritis, right hand: Secondary | ICD-10-CM

## 2020-01-11 DIAGNOSIS — M19042 Primary osteoarthritis, left hand: Secondary | ICD-10-CM

## 2020-01-11 DIAGNOSIS — Z79899 Other long term (current) drug therapy: Secondary | ICD-10-CM | POA: Diagnosis not present

## 2020-01-11 DIAGNOSIS — Z8669 Personal history of other diseases of the nervous system and sense organs: Secondary | ICD-10-CM

## 2020-01-11 DIAGNOSIS — M797 Fibromyalgia: Secondary | ICD-10-CM

## 2020-01-11 DIAGNOSIS — J842 Lymphoid interstitial pneumonia: Secondary | ICD-10-CM | POA: Diagnosis not present

## 2020-01-11 DIAGNOSIS — M25571 Pain in right ankle and joints of right foot: Secondary | ICD-10-CM

## 2020-01-11 MED ORDER — AZATHIOPRINE 50 MG PO TABS: ORAL_TABLET | ORAL | 0 refills | Status: DC

## 2020-01-11 NOTE — Patient Instructions (Signed)
Standing Labs We placed an order today for your standing lab work.   Please have your standing labs drawn in 2 weeks x2 , 2 months and every 3 months  If possible, please have your labs drawn 2 weeks prior to your appointment so that the provider can discuss your results at your appointment.  We have open lab daily Monday through Thursday from 8:30-12:30 PM and 1:30-4:30 PM and Friday from 8:30-12:30 PM and 1:30-4:00 PM at the office of Dr. Pollyann Savoy, [facility].   Please be advised, patients with office appointments requiring lab work will take precedents over walk-in lab work.  If possible, please come for your lab work on Monday and Friday afternoons, as you may experience shorter wait times. The office is located at [address], [address], [place], [place] [address] No appointment is necessary.   Labs are drawn by [facility]. Please bring your co-pay at the time of your lab draw.  You may receive a bill from [facility] for your lab work.  If you wish to have your labs drawn at another location, please call the office 24 hours in advance to send orders.  If you have any questions regarding directions or hours of operation,  please call [phone].   As a reminder, please drink plenty of water prior to coming for your lab work. Thanks!

## 2020-01-12 LAB — CBC WITH DIFFERENTIAL/PLATELET
Absolute Monocytes: 498 cells/uL (ref 200–950)
Basophils Absolute: 28 cells/uL (ref 0–200)
Basophils Relative: 0.6 %
Eosinophils Absolute: 71 cells/uL (ref 15–500)
Eosinophils Relative: 1.5 %
HCT: 40.1 % (ref 35.0–45.0)
Hemoglobin: 13.3 g/dL (ref 11.7–15.5)
Lymphs Abs: 1589 cells/uL (ref 850–3900)
MCH: 28.9 pg (ref 27.0–33.0)
MCHC: 33.2 g/dL (ref 32.0–36.0)
MCV: 87.2 fL (ref 80.0–100.0)
MPV: 11.5 fL (ref 7.5–12.5)
Monocytes Relative: 10.6 %
Neutro Abs: 2515 cells/uL (ref 1500–7800)
Neutrophils Relative %: 53.5 %
Platelets: 261 10*3/uL (ref 140–400)
RBC: 4.6 10*6/uL (ref 3.80–5.10)
RDW: 12.2 % (ref 11.0–15.0)
Total Lymphocyte: 33.8 %
WBC: 4.7 10*3/uL (ref 3.8–10.8)

## 2020-01-12 LAB — COMPLETE METABOLIC PANEL WITH GFR
AG Ratio: 1.3 (calc) (ref 1.0–2.5)
ALT: 15 U/L (ref 6–29)
AST: 23 U/L (ref 10–35)
Albumin: 4.3 g/dL (ref 3.6–5.1)
Alkaline phosphatase (APISO): 97 U/L (ref 37–153)
BUN: 13 mg/dL (ref 7–25)
CO2: 27 mmol/L (ref 20–32)
Calcium: 9.7 mg/dL (ref 8.6–10.4)
Chloride: 102 mmol/L (ref 98–110)
Creat: 0.68 mg/dL (ref 0.50–1.05)
GFR, Est African American: 111 mL/min/{1.73_m2} (ref >=60)
GFR, Est Non African American: 96 mL/min/{1.73_m2} (ref >=60)
Globulin: 3.2 g/dL (calc) (ref 1.9–3.7)
Glucose, Bld: 82 mg/dL (ref 65–139)
Potassium: 4.3 mmol/L (ref 3.5–5.3)
Sodium: 138 mmol/L (ref 135–146)
Total Bilirubin: 0.5 mg/dL (ref 0.2–1.2)
Total Protein: 7.5 g/dL (ref 6.1–8.1)

## 2020-01-12 NOTE — Progress Notes (Signed)
CBC and CMP are normal.

## 2020-01-25 ENCOUNTER — Other Ambulatory Visit: Payer: Self-pay | Admitting: Rheumatology

## 2020-02-08 ENCOUNTER — Other Ambulatory Visit: Payer: Self-pay | Admitting: Rheumatology

## 2020-02-08 DIAGNOSIS — M359 Systemic involvement of connective tissue, unspecified: Secondary | ICD-10-CM

## 2020-02-08 NOTE — Telephone Encounter (Signed)
Last Visit: 01/11/2020 Next Visit: 02/27/2020 Labs: 01/11/2020 CBC and CMP are normal. Eye exam:  02/22/2019 WNL   Current Dose per office note 01/11/2020: Plaquenil 200 mg 1 tablet in the morning and 1/2 tablet in the evening. DX: Sjogren's syndrome with keratoconjunctivitis sicca   Okay to refill per Dr. Corliss Skains

## 2020-02-13 NOTE — Progress Notes (Signed)
Office Visit Note  Patient: Stefanie [name]             Date of Birth: 1960/11/08           MRN: [id number]             PCP: Creola Corn, MD Referring: Creola Corn, MD Visit Date: 02/27/2020 Occupation: @GUAROCC @  Subjective:  Medication management.   History of Present Illness: Stefanie [name] is a 59 y.o. female history of Sjogren's, interstitial lung disease, pseudogout and osteoarthritis.  She was started on Imuran at the last visit.  She has been taking Imuran 100 mg p.o. daily and Plaquenil 300 mg p.o. daily.  She has been tolerating the medications well.  She has noticed improvement in her shortness of breath.  She has not had any increased joint swelling.  The dry mouth and dry eye symptoms persist.  She has not had any pseudogout flares.  She has been taking colchicine on a daily basis.  She currently have upper respiratory tract infection which is improving.  She is taking Mucinex.  Activities of Daily Living:  Patient reports morning stiffness for  45 minutes.   Patient Reports nocturnal pain.  Difficulty dressing/grooming: Denies Difficulty climbing stairs: Reports Difficulty getting out of chair: Reports Difficulty using hands for taps, buttons, cutlery, and/or writing: Reports  Review of Systems  Constitutional: Positive for fatigue.  HENT: Positive for mouth dryness and nose dryness. Negative for mouth sores.   Eyes: Positive for dryness. Negative for pain and itching.  Respiratory: Negative for shortness of breath and difficulty breathing.   Cardiovascular: Negative for chest pain and palpitations.  Gastrointestinal: Negative for blood in stool, constipation and diarrhea.  Endocrine: Negative for increased urination.  Genitourinary: Negative for difficulty urinating.  Musculoskeletal: Positive for arthralgias, joint pain, joint swelling and morning stiffness. Negative for myalgias, muscle tenderness and myalgias.  Skin: Positive for redness. Negative for  color change and rash.  Allergic/Immunologic: Negative for susceptible to infections.  Neurological: Positive for parasthesias and memory loss. Negative for dizziness, numbness, headaches and weakness.  Hematological: Positive for bruising/bleeding tendency.  Psychiatric/Behavioral: Negative for confusion.    PMFS History:  Patient Active Problem List   Diagnosis Date Noted  . 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 Gasping for breath 05/10/2018  . Bursitis, ischial, right 10/08/2016  . 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 Fibromyalgia 10/08/2016  . Other fatigue 10/08/2016  . DDD (degenerative disc disease), lumbar/ and scoliosis 10/08/2016  . Facial droop   . 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 Prolonged QT interval 07/28/2016  . Primary osteoarthritis of both hands 05/11/2016  . Primary osteoarthritis of both knees 05/11/2016  . History of hypothyroidism 05/11/2016  . History of migraine 05/11/2016  . 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 ANA positive 04/29/2016  . Rheumatoid factor positive 04/29/2016  . Pseudogout 04/29/2016  . Chondrocalcinosis 04/29/2016  . High risk medication use 04/29/2016  . Sjogren's syndrome with keratoconjunctivitis sicca (HCC) 04/29/2016  . History of neutropenia 04/29/2016  . 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  . Hyponatremia 12/12/2013  . Fever 12/13/2012  . Pleuritic chest pain 12/06/2012  . 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 Anxiety 12/06/2012  . ANEMIA-IRON DEFICIENCY 01/08/2010  . GASTRIC DILATION, ACUTE 01/08/2010  . NAUSEA ALONE 01/08/2010  . ABDOMINAL PAIN, LEFT UPPER QUADRANT 01/08/2010    Past Medical History:  Diagnosis Date  . Anemia   .  Arthritis   . Asthma   . Diabetes insipidus (HCC)   . Fibromyalgia   . Lupus (HCC)   . Mitral valve prolapse   . SLE (systemic lupus  erythematosus) (HCC) 12/06/2012  . Thyroid disease     Family History  Problem Relation Age of Onset  . Diabetes Father   . Heart disease Father   . Hyperlipidemia Father   . Hypertension Father   . Stroke Father   . Diabetes Sister   . Kidney disease Sister        transplant  . Heart disease Sister        open heart surgery   . Pancreatic disease Sister        transplant   . Cancer Maternal Grandmother   . Diabetes Paternal Grandfather   . Heart disease Paternal Grandfather   . Hyperlipidemia Paternal Grandfather   . Hypertension Paternal Grandfather   . Healthy Daughter   . Healthy Son    Past Surgical History:  Procedure Laterality Date  . PITUITARY EXCISION    . TONSILLECTOMY     Social History   Social History Narrative  . Not on file   Immunization History  Administered Date(s) Administered  . Influenza-Unspecified 09/28/2018  . PFIZER SARS-COV-2 Vaccination 11/17/2019, 12/13/2019     Objective: Vital Signs: BP 104/71 (BP Location: Left Arm, Patient Position: Sitting, Cuff Size: Normal)   Pulse 75   Resp 14   Ht 5\' 6"  (1.676 m)   Wt 143 lb 9.6 oz (65.1 kg)   LMP 04/09/2014 (Approximate)   BMI 23.18 kg/m    Physical Exam Vitals and nursing note reviewed.  Constitutional:      Appearance: She is well-developed and well-nourished.  HENT:     Head: Normocephalic and atraumatic.  Eyes:     Extraocular Movements: EOM normal.     Conjunctiva/sclera: Conjunctivae normal.  Cardiovascular:     Rate and Rhythm: Normal rate and regular rhythm.     Pulses: Intact distal pulses.     Heart sounds: Normal heart sounds.  Pulmonary:     Effort: Pulmonary effort is normal.     Breath sounds: Normal breath sounds.  Abdominal:     General: Bowel sounds are normal.     Palpations: Abdomen is soft.  Musculoskeletal:     Cervical back: Normal range of motion.  Lymphadenopathy:     Cervical: No cervical adenopathy.  Skin:    General: Skin is warm and dry.      Capillary Refill: Capillary refill takes less than 2 seconds.  Neurological:     Mental Status: She is alert and oriented to person, place, and time.  Psychiatric:        Mood and Affect: Mood and affect normal.        Behavior: Behavior normal.      Musculoskeletal Exam: C-spine thoracic and lumbar spine with good range of motion.  Shoulder joints, elbow joints, wrist joints, MCPs PIPs and DIPs with good range of motion with no synovitis.  Hip joints with good range of motion.  She had small effusions in bilateral knee joints.  There was no tenderness over ankles or MTPs.  CDAI Exam: CDAI Score: -- Patient Global: --; Provider Global: -- Swollen: --; Tender: -- Joint Exam 02/27/2020   No joint exam has been documented for this visit   There is currently no information documented on the homunculus. Go to the Rheumatology activity and complete the homunculus joint exam.  Investigation:  No additional findings.  Imaging: No results found.  Recent Labs: Lab Results  Component Value Date   WBC 4.6 02/16/2020   HGB 13.1 02/16/2020   PLT 221 02/16/2020   NA 139 02/16/2020   K 4.2 02/16/2020   CL 103 02/16/2020   CO2 28 02/16/2020   GLUCOSE 84 02/16/2020   BUN 16 02/16/2020   CREATININE 0.69 02/16/2020   BILITOT 0.5 02/16/2020   ALKPHOS 97 07/30/2016   AST 19 02/16/2020   ALT 17 02/16/2020   PROT 7.6 02/16/2020   ALBUMIN 3.6 07/30/2016   CALCIUM 9.7 02/16/2020   GFRAA 110 02/16/2020   QFTBGOLDPLUS NEGATIVE 10/16/2019    Speciality Comments: PLQ Eye Exam 02/22/2019 WNL @ [facility] follow up in 1 year  Procedures:  No procedures performed Allergies: Iron sucrose, Morphine, Venofer [ferric oxide], Azithromycin, and Other   Assessment / Plan:     Visit Diagnoses: Sjogren's syndrome with keratoconjunctivitis sicca (HCC) - Positive ANA, positive Ro, positive La, positive RF sicca symptoms, inflammatory arthritis.  She is doing better since she started taking  Imuran 100 mg p.o. daily along with Plaquenil.  She states her sicca symptoms persist for which she has been using over-the-counter products.  She has not noticed improvement in fatigue and joint pain.  Her shortness of breath has also improved.  High risk medication use - Imuran 100 mg po qd, Plaquenil 200 mg 1 tablet in the morning and 1/2 tablet in the evening. PLQ Eye Exam 02/26/20 by Dr. Randon Goldsmith.  Labs have been stable.  We will continue to monitor labs every 3 months.  Lymphoid interstitial pneumonia (HCC) - she is followed by Dr. Isaiah Serge.  She notices improvement in the shortness of breath.  Primary osteoarthritis of both hands-joint protection was discussed.  Primary osteoarthritis of both knees-she continues to have some discomfort in her knee joints.  She had mild effusion in her bilateral knee joints.  Chondromalacia patellae, right knee  Chondrocalcinosis -she denies any flare of chondrocalcinosis.  Colchicine 0.6 mg 1 capsule by mouth daily.    Cervical spondylosis without myelopathy-denies discomfort today.  Fibromyalgia-she continues to have some generalized pain and discomfort.  Other fatigue  History of diabetes insipidus  History of migraine  Prolonged QT interval  History of anxiety  History of hypothyroidism  Osteoporosis screening - Patient will get DEXA with her PCP.  Orders: No orders of the defined types were placed in this encounter.  No orders of the defined types were placed in this encounter.     Follow-Up Instructions: Return in about 3 months (around 05/27/2020) for Sjogren's, Osteoarthritis.   Pollyann Savoy, MD  Note - This record has been created using Animal nutritionist.  Chart creation errors have been sought, but may not always  have been located. Such creation errors do not reflect on  the standard of medical care.

## 2020-02-16 ENCOUNTER — Other Ambulatory Visit: Payer: Self-pay | Admitting: *Deleted

## 2020-02-16 ENCOUNTER — Encounter: Payer: Self-pay | Admitting: Cardiology

## 2020-02-16 ENCOUNTER — Ambulatory Visit: Payer: 59 | Admitting: Cardiology

## 2020-02-16 ENCOUNTER — Other Ambulatory Visit: Payer: Self-pay

## 2020-02-16 VITALS — BP 119/74 | HR 81 | Ht 66.0 in | Wt 144.0 lb

## 2020-02-16 DIAGNOSIS — I361 Nonrheumatic tricuspid (valve) insufficiency: Secondary | ICD-10-CM

## 2020-02-16 DIAGNOSIS — R079 Chest pain, unspecified: Secondary | ICD-10-CM

## 2020-02-16 DIAGNOSIS — Z79899 Other long term (current) drug therapy: Secondary | ICD-10-CM

## 2020-02-16 DIAGNOSIS — I340 Nonrheumatic mitral (valve) insufficiency: Secondary | ICD-10-CM

## 2020-02-16 DIAGNOSIS — R0609 Other forms of dyspnea: Secondary | ICD-10-CM

## 2020-02-16 DIAGNOSIS — R0600 Dyspnea, unspecified: Secondary | ICD-10-CM

## 2020-02-16 LAB — CBC WITH DIFFERENTIAL/PLATELET
Absolute Monocytes: 497 cells/uL (ref 200–950)
Basophils Absolute: 41 cells/uL (ref 0–200)
Basophils Relative: 0.9 %
Eosinophils Absolute: 101 cells/uL (ref 15–500)
Eosinophils Relative: 2.2 %
HCT: 38.5 % (ref 35.0–45.0)
Hemoglobin: 13.1 g/dL (ref 11.7–15.5)
Lymphs Abs: 1615 cells/uL (ref 850–3900)
MCH: 29.6 pg (ref 27.0–33.0)
MCHC: 34 g/dL (ref 32.0–36.0)
MCV: 86.9 fL (ref 80.0–100.0)
MPV: 11.7 fL (ref 7.5–12.5)
Monocytes Relative: 10.8 %
Neutro Abs: 2346 cells/uL (ref 1500–7800)
Neutrophils Relative %: 51 %
Platelets: 221 10*3/uL (ref 140–400)
RBC: 4.43 10*6/uL (ref 3.80–5.10)
RDW: 12.4 % (ref 11.0–15.0)
Total Lymphocyte: 35.1 %
WBC: 4.6 10*3/uL (ref 3.8–10.8)

## 2020-02-16 LAB — COMPLETE METABOLIC PANEL WITH GFR
AG Ratio: 1.3 (calc) (ref 1.0–2.5)
ALT: 17 U/L (ref 6–29)
AST: 19 U/L (ref 10–35)
Albumin: 4.3 g/dL (ref 3.6–5.1)
Alkaline phosphatase (APISO): 98 U/L (ref 37–153)
BUN: 16 mg/dL (ref 7–25)
CO2: 28 mmol/L (ref 20–32)
Calcium: 9.7 mg/dL (ref 8.6–10.4)
Chloride: 103 mmol/L (ref 98–110)
Creat: 0.69 mg/dL (ref 0.50–0.99)
GFR, Est African American: 110 mL/min/{1.73_m2} (ref >=60)
GFR, Est Non African American: 95 mL/min/{1.73_m2} (ref >=60)
Globulin: 3.3 g/dL (calc) (ref 1.9–3.7)
Glucose, Bld: 84 mg/dL (ref 65–99)
Potassium: 4.2 mmol/L (ref 3.5–5.3)
Sodium: 139 mmol/L (ref 135–146)
Total Bilirubin: 0.5 mg/dL (ref 0.2–1.2)
Total Protein: 7.6 g/dL (ref 6.1–8.1)

## 2020-02-16 NOTE — Progress Notes (Signed)
Stefanie [name] Date of Birth: 15-Aug-1960 MRN: [id number] Primary Care Provider:Russo, Jonny Ruiz, MD Former Cardiology Providers: Altamese Monroe, APRN, FNP-C Primary Cardiologist: Tessa Lerner, DO, [facility] (established care 11/15/2019)  Date: 02/16/20 Last Office Visit: 11/15/2019  Chief Complaint  Patient presents with  . Chest Pain    HPI  Stefanie [name] is a 59 y.o.  female who presents to the office with a chief complaint of " reevaluation of chest pain." Patient's past medical history and cardiovascular risk factors include: History of fibromyalgia, rheumatoid arthritis with positive ANA and rheumatoid factor, keratoconjunctivitis silica, lymphoid interstitial pneumonitis, CPPD, postmenopausal female.  Patient follows up with the clinic for management of palpitations and effort related dyspnea.    At the last office visit patient was complaining of precordial pain suggestive of atypical chest pain.  Recommended that she start a short course of NSAIDs/Tylenol and the symptoms did not improve she was scheduled to undergo stress test.  However, patient states that since last office visit her chest pain has essentially resolved.  And therefore she did not schedule her stress test.    Currently she denies any angina pectoris or congestive heart failure symptoms.  Patient states that since last office visit she has been started on Imuran given her CT findings suggestive of cystic and nodular interstitial lung disease with pattern suggestive of lymphoid interstitial pneumonia.    Patient states that her shortness of breath is chronic and stable.   ALLERGIES: Allergies  Allergen Reactions  . Iron Sucrose Rash    Chest pain  . Morphine     Other reaction(s): Vomiting  . Venofer [Ferric Oxide] Palpitations    Dizziness, sweating, "felt like I was going to pass out" unknown when episode happened, itching  . Azithromycin Other (See Comments)    "couldnt sleep"  . Other     Other  reaction(s): Other (See Comments) Magic mouth wash. Caused tongue to have a burning feeling     MEDICATION LIST PRIOR TO VISIT: Current Outpatient Medications on File Prior to Visit  Medication Sig Dispense Refill  . albuterol (PROVENTIL HFA;VENTOLIN HFA) 108 (90 BASE) MCG/ACT inhaler Inhale 1 puff into the lungs every 6 (six) hours as needed for wheezing or shortness of breath.    . Ascorbic Acid (VITAMIN C) 1000 MG tablet Take 1,000 mg by mouth daily.    [name] azaTHIOprine (IMURAN) 50 MG tablet Take 50mg  po qd x 2 weeks, if labs are stable increase to 100mg  po qd. 42 tablet 0  . colchicine 0.6 MG tablet Take 0.6 mg by mouth daily.    . hydroxychloroquine (PLAQUENIL) 200 MG tablet TAKE 1 TABLET BY MOUTH IN THE MORNING AND 1/2 TABLET EVERY EVENING 135 tablet 0  . levothyroxine (SYNTHROID, LEVOTHROID) 125 MCG tablet Take 125 mcg by mouth at bedtime.     MITIGARE 0.6 MG CAPS TAKE 1 CAPSULE BY MOUTH EVERY DAY 90 capsule 0  . rizatriptan (MAXALT) 10 MG tablet Take 10 mg by mouth daily as needed for migraine. May repeat in 2 hours if needed    . Turmeric 450 MG CAPS Take by mouth daily.      No current facility-administered medications on file prior to visit.    PAST MEDICAL HISTORY: Past Medical History:  Diagnosis Date  . Anemia   . Arthritis   . Asthma   . Diabetes insipidus (HCC)   . Fibromyalgia   . Lupus (HCC)   . Mitral valve prolapse   . SLE (systemic  lupus erythematosus) (HCC) 12/06/2012  . Thyroid disease     PAST SURGICAL HISTORY: Past Surgical History:  Procedure Laterality Date  . PITUITARY EXCISION    . TONSILLECTOMY      FAMILY HISTORY: The patient's family history includes Cancer in her maternal grandmother; Diabetes in her father, paternal grandfather, and sister; Healthy in her daughter and son; Heart disease in her father, paternal grandfather, and sister; Hyperlipidemia in her father and paternal grandfather; Hypertension in her father and paternal grandfather;  Kidney disease in her sister; Pancreatic disease in her sister; Stroke in her father.   SOCIAL HISTORY:  The patient  reports that she has never smoked. She has never used smokeless tobacco. She reports that she does not drink alcohol and does not use drugs.  Review of Systems  Constitutional: Negative for chills and fever.  HENT: Negative for hoarse voice and nosebleeds.   Eyes: Negative for discharge, double vision and pain.  Cardiovascular: Negative for chest pain, claudication, leg swelling, near-syncope, orthopnea, palpitations, paroxysmal nocturnal dyspnea and syncope.  Respiratory: Positive for shortness of breath (chronic and stable. ). Negative for hemoptysis.   Musculoskeletal: Positive for arthritis, back pain, joint pain and stiffness. Negative for muscle cramps and myalgias.  Gastrointestinal: Negative for abdominal pain, constipation, diarrhea, hematemesis, hematochezia, melena, nausea and vomiting.  Neurological: Negative for dizziness and light-headedness.    PHYSICAL EXAM: Vitals with BMI 02/16/2020 01/11/2020 11/15/2019  Height 5\' 6"  5\' 6"  5\' 6"   Weight 144 lbs 144 lbs 145 lbs  BMI 23.25 23.25 23.41  Systolic 119 118 161110  Diastolic 74 81 73  Pulse 81 77 82    CONSTITUTIONAL: Well-developed and well-nourished. No acute distress.  SKIN: Skin is warm and dry. No rash noted. No cyanosis. No pallor. No jaundice HEAD: Normocephalic and atraumatic.  EYES: No scleral icterus MOUTH/THROAT: Moist oral membranes.  NECK: No JVD present. No thyromegaly noted. No carotid bruits  LYMPHATIC: No visible cervical adenopathy.  CHEST Normal respiratory effort. No intercostal retractions  LUNGS: Clear to auscultation bilaterally. No stridor. No wheezes. No rales.  CARDIOVASCULAR: Regular rate and rhythm, positive S1-S2, Holosystolic murmur heard at the apex, no rubs or gallops appreciated. ABDOMINAL: No apparent ascites.  EXTREMITIES: No peripheral edema  HEMATOLOGIC: No significant  bruising NEUROLOGIC: Oriented to person, place, and time. Nonfocal. Normal muscle tone.  PSYCHIATRIC: Normal mood and affect. Normal behavior. Cooperative  CARDIAC DATABASE: EKG: 04/28/2019: Normal sinus rhythm at 76 bpm, normal axis, no evidence of ischemia. Low voltage complexes. 11/15/2019: Sinus  Rhythm, 86bpm, normal axis, occasional PAC, old anteroseptal infarct, without injury pattern.   Echocardiogram: Echocardiogram 04/07/2019: Left ventricle cavity is normal in size and wall thickness. Normal global wall motion. Normal LV systolic function with EF 61%. Normal diastolic filling pattern. Mild prolapse of the mitral valve leaflets. Moderate (Grade III) mitral regurgitation. Moderate to severe tricuspid regurgitation. Estimated pulmonary artery systolic pressure is 27 mmHg. Compared to prior echo 05/08/2016: LVEF 55-60%, normal diastolic function, mild to moderate MR, mild TR   Stress Testing:  Lexiscan 05/08/2016:  Nuclear stress EF: 64%.  There was no ST segment deviation noted during stress.  Defect 1: There is a medium defect of moderate severity present in the mid anterior and apical anterior location.  The study is normal.  This is a low risk study.  The left ventricular ejection fraction is normal (55-65%)  Low risk stress nuclear study with breast attenuation; no ischemia; EF 64 with normal wall motion.  Heart Catheterization: None  30-day event monitor 6/17-7/16/2020: Normal sinus rhythm. One auto detected event occurred on day 30 at 1647 for sinus tachycardia at 149 bpm. No patient triggered events occurred. Fastest heart rate was 149 bpm. No A. fib or SVT was noted.  LABORATORY DATA: CBC Latest Ref Rng & Units 01/11/2020 10/16/2019 06/01/2019  WBC 3.8 - 10.8 Thousand/uL 4.7 4.3 4.2  Hemoglobin 11.7 - 15.5 g/dL 78.2 42.3 53.6  Hematocrit 35.0 - 45.0 % 40.1 38.7 41.5  Platelets 140 - 400 Thousand/uL 261 226 247    CMP Latest Ref Rng & Units 01/11/2020 10/16/2019  10/16/2019  Glucose 65 - 139 mg/dL 82 - 78  BUN 7 - 25 mg/dL 13 - 13  Creatinine 1.44 - 1.05 mg/dL 3.15 - 4.00  Sodium 867 - 146 mmol/L 138 - 139  Potassium 3.5 - 5.3 mmol/L 4.3 - 3.8  Chloride 98 - 110 mmol/L 102 - 102  CO2 20 - 32 mmol/L 27 - 26  Calcium 8.6 - 10.4 mg/dL 9.7 - 9.9  Total Protein 6.1 - 8.1 g/dL 7.5 8.1 8.0  Total Bilirubin 0.2 - 1.2 mg/dL 0.5 - 0.8  Alkaline Phos 38 - 126 U/L - - -  AST 10 - 35 U/L 23 - 26  ALT 6 - 29 U/L 15 - 19   Cardiac Panel (last 3 results) No results for input(s): CKTOTAL, CKMB, TROPONINIHS, RELINDX in the last 72 hours.  IMPRESSION:    ICD-10-CM   1. Chest pain, unspecified type  R07.9   2. Moderate mitral regurgitation  I34.0 PCV ECHOCARDIOGRAM COMPLETE  3. Nonrheumatic tricuspid valve regurgitation  I36.1 PCV ECHOCARDIOGRAM COMPLETE  4. Dyspnea on exertion  R06.00 Basic metabolic panel    Magnesium    Pro b natriuretic peptide (BNP)    Pro b natriuretic peptide (BNP)    Magnesium    Basic metabolic panel     RECOMMENDATIONS: Stefanie [name] is a 59 y.o. 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  Precordial chest pain:  Not anginal chest pain has essentially resolved.  Shared decision is to hold off on the stress test that was ordered at last office visit as her symptoms have improved.    Dyspnea on exertion: Chronic and stable  This is mostly multifactorial given her underlying valvular heart disease, interstitial lung disease.   However given her underlying interstitial lung disease cannot entirely rule out pulmonary hypertension.  Had a detailed discussion with the patient in regards to the development of pulmonary hypertension in patients who have interstitial lung disease.  Patient is asked to seek medical  attention sooner if she starts developing progressive shortness of breath, generalized swelling, or symptoms suggestive or of orthopnea, paroxysmal nocturnal dyspnea or lower extremity swelling.  We will repeat the echocardiogram in January 2022 to evaluate for LVEF, valvular heart disease, and to assess the right ventricle.  Prior to the next office visit I would like to check a BNP as well.  Moderate mitral regurgitation: Continue to monitor.  Plan echocardiogram in January 2022 to reevaluate the progression of MR and TR.  FINAL MEDICATION LIST END OF ENCOUNTER: No orders of the defined types were placed in this encounter.   There are no discontinued medications.   Current Outpatient Medications:  .  albuterol (PROVENTIL HFA;VENTOLIN HFA) 108 (90 BASE) MCG/ACT inhaler, Inhale 1 puff into the lungs every 6 (six) hours  as needed for wheezing or shortness of breath., Disp: , Rfl:  .  Ascorbic Acid (VITAMIN C) 1000 MG tablet, Take 1,000 mg by mouth daily., Disp: , Rfl:  .  azaTHIOprine (IMURAN) 50 MG tablet, Take 50mg  po qd x 2 weeks, if labs are stable increase to 100mg  po qd., Disp: 42 tablet, Rfl: 0 .  colchicine 0.6 MG tablet, Take 0.6 mg by mouth daily., Disp: , Rfl:  .  hydroxychloroquine (PLAQUENIL) 200 MG tablet, TAKE 1 TABLET BY MOUTH IN THE MORNING AND 1/2 TABLET EVERY EVENING, Disp: 135 tablet, Rfl: 0 .  levothyroxine (SYNTHROID, LEVOTHROID) 125 MCG tablet, Take 125 mcg by mouth at bedtime. , Disp: , Rfl:  .  MITIGARE 0.6 MG CAPS, TAKE 1 CAPSULE BY MOUTH EVERY DAY, Disp: 90 capsule, Rfl: 0 .  rizatriptan (MAXALT) 10 MG tablet, Take 10 mg by mouth daily as needed for migraine. May repeat in 2 hours if needed, Disp: , Rfl:  .  Turmeric 450 MG CAPS, Take by mouth daily. , Disp: , Rfl:   Orders Placed This Encounter  Procedures  . Basic metabolic panel  . Magnesium  . Pro b natriuretic peptide (BNP)  . PCV ECHOCARDIOGRAM COMPLETE   --Continue cardiac medications as reconciled in  final medication list. --Return in about 2 months (around 04/22/2020) for Follow up review labs and echo . Or sooner if needed. --Continue follow-up with your primary care physician regarding the management of your other chronic comorbid conditions.  Patient's questions and concerns were addressed to her satisfaction. She voices understanding of the instructions provided during this encounter.   This note was created using a voice recognition software as a result there may be grammatical errors inadvertently enclosed that do not reflect the nature of this encounter. Every attempt is made to correct such errors.  , 04/24/2020, [facility]  Pager: [phone] Office: [phone]

## 2020-02-26 LAB — HM DIABETES EYE EXAM

## 2020-02-27 ENCOUNTER — Other Ambulatory Visit: Payer: Self-pay

## 2020-02-27 ENCOUNTER — Encounter: Payer: Self-pay | Admitting: Rheumatology

## 2020-02-27 ENCOUNTER — Ambulatory Visit: Payer: 59 | Admitting: Rheumatology

## 2020-02-27 VITALS — BP 104/71 | HR 75 | Resp 14 | Ht 66.0 in | Wt 143.6 lb

## 2020-02-27 DIAGNOSIS — M170 Bilateral primary osteoarthritis of knee: Secondary | ICD-10-CM

## 2020-02-27 DIAGNOSIS — M797 Fibromyalgia: Secondary | ICD-10-CM

## 2020-02-27 DIAGNOSIS — M2241 Chondromalacia patellae, right knee: Secondary | ICD-10-CM

## 2020-02-27 DIAGNOSIS — Z13820 Encounter for screening for osteoporosis: Secondary | ICD-10-CM

## 2020-02-27 DIAGNOSIS — Z79899 Other long term (current) drug therapy: Secondary | ICD-10-CM | POA: Diagnosis not present

## 2020-02-27 DIAGNOSIS — M19041 Primary osteoarthritis, right hand: Secondary | ICD-10-CM | POA: Diagnosis not present

## 2020-02-27 DIAGNOSIS — M19042 Primary osteoarthritis, left hand: Secondary | ICD-10-CM

## 2020-02-27 DIAGNOSIS — J842 Lymphoid interstitial pneumonia: Secondary | ICD-10-CM

## 2020-02-27 DIAGNOSIS — R9431 Abnormal electrocardiogram [ECG] [EKG]: Secondary | ICD-10-CM

## 2020-02-27 DIAGNOSIS — Z8639 Personal history of other endocrine, nutritional and metabolic disease: Secondary | ICD-10-CM

## 2020-02-27 DIAGNOSIS — M1120 Other chondrocalcinosis, unspecified site: Secondary | ICD-10-CM

## 2020-02-27 DIAGNOSIS — Z8669 Personal history of other diseases of the nervous system and sense organs: Secondary | ICD-10-CM

## 2020-02-27 DIAGNOSIS — M47812 Spondylosis without myelopathy or radiculopathy, cervical region: Secondary | ICD-10-CM

## 2020-02-27 DIAGNOSIS — R5383 Other fatigue: Secondary | ICD-10-CM

## 2020-02-27 DIAGNOSIS — M25571 Pain in right ankle and joints of right foot: Secondary | ICD-10-CM

## 2020-02-27 DIAGNOSIS — M3501 Sicca syndrome with keratoconjunctivitis: Secondary | ICD-10-CM | POA: Diagnosis not present

## 2020-02-27 DIAGNOSIS — Z8659 Personal history of other mental and behavioral disorders: Secondary | ICD-10-CM

## 2020-02-27 NOTE — Patient Instructions (Signed)
Standing Labs We placed an order today for your standing lab work.   Please have your standing labs drawn in March and every 3 months  If possible, please have your labs drawn 2 weeks prior to your appointment so that the provider can discuss your results at your appointment.  We have open lab daily Monday through Thursday from 8:30-12:30 PM and 1:30-4:30 PM and Friday from 8:30-12:30 PM and 1:30-4:00 PM at the office of Dr. Aerie Donica, [facility].   Please be advised, patients with office appointments requiring lab work will take precedents over walk-in lab work.  If possible, please come for your lab work on Monday and Friday afternoons, as you may experience shorter wait times. The office is located at [address], [address], [place], [place] [address] No appointment is necessary.   Labs are drawn by [facility]. Please bring your co-pay at the time of your lab draw.  You may receive a bill from [facility] for your lab work.  If you wish to have your labs drawn at another location, please call the office 24 hours in advance to send orders.  If you have any questions regarding directions or hours of operation,  please call [phone].   As a reminder, please drink plenty of water prior to coming for your lab work. Thanks!   COVID-19 vaccine recommendations:   COVID-19 vaccine is recommended for everyone (unless you are allergic to a vaccine component), even if you are on a medication that suppresses your immune system.    Do not take Tylenol or any anti-inflammatory medications (NSAIDs) 24 hours prior to the COVID-19 vaccination.   There is no direct evidence about the efficacy of the COVID-19 vaccine in individuals who are on medications that suppress the immune system.   Even if you are fully vaccinated, and you are on any medications that suppress your immune system, please continue to wear a mask, maintain at least six feet social distance and practice hand  hygiene.   If you develop a COVID-19 infection, please contact your PCP or our office to determine if you need monoclonal antibody infusion.  The booster vaccine is now available for immunocompromised patients.   Please see the following web sites for updated information.   [email or web address]    

## 2020-03-04 ENCOUNTER — Other Ambulatory Visit: Payer: Self-pay

## 2020-03-04 ENCOUNTER — Ambulatory Visit: Payer: 59

## 2020-03-04 DIAGNOSIS — I361 Nonrheumatic tricuspid (valve) insufficiency: Secondary | ICD-10-CM

## 2020-03-04 DIAGNOSIS — I340 Nonrheumatic mitral (valve) insufficiency: Secondary | ICD-10-CM

## 2020-03-06 ENCOUNTER — Other Ambulatory Visit: Payer: Self-pay | Admitting: Rheumatology

## 2020-03-06 DIAGNOSIS — M359 Systemic involvement of connective tissue, unspecified: Secondary | ICD-10-CM

## 2020-03-06 MED ORDER — AZATHIOPRINE 50 MG PO TABS: 100.0000 mg | ORAL_TABLET | Freq: Every day | ORAL | 0 refills | Status: DC

## 2020-03-06 NOTE — Telephone Encounter (Signed)
Last Visit: 02/27/2020 Next Visit: 05/31/2020 Labs: 02/16/2020 CBC and CMP WNL  Current Dose per office note 02/27/2020: Imuran 100 mg po qd DX: Sjogren's syndrome with keratoconjunctivitis sicca   Okay to refill Imuran?

## 2020-03-17 ENCOUNTER — Other Ambulatory Visit: Payer: Self-pay | Admitting: Physician Assistant

## 2020-03-18 NOTE — Telephone Encounter (Signed)
Last Visit: 02/27/2020 Next Visit: 05/11/2020 Labs: 12/202/2021, CBC and CMP WNL  Current Dose per office note 02/27/2020, Colchicine 0.6 mg 1 capsule by mouth daily. DX: Chondrocalcinosis  Okay to refill Mitigare?

## 2020-04-11 ENCOUNTER — Ambulatory Visit: Payer: 59 | Admitting: Neurology

## 2020-04-19 ENCOUNTER — Ambulatory Visit: Payer: 59 | Admitting: Cardiology

## 2020-04-25 LAB — BASIC METABOLIC PANEL
BUN/Creatinine Ratio: 18 (ref 12–28)
BUN: 13 mg/dL (ref 8–27)
CO2: 22 mmol/L (ref 20–29)
Calcium: 9.5 mg/dL (ref 8.7–10.3)
Chloride: 99 mmol/L (ref 96–106)
Creatinine, Ser: 0.74 mg/dL (ref 0.57–1.00)
GFR calc Af Amer: 102 mL/min/{1.73_m2} (ref >59)
GFR calc non Af Amer: 88 mL/min/{1.73_m2} (ref >59)
Glucose: 78 mg/dL (ref 65–99)
Potassium: 4.1 mmol/L (ref 3.5–5.2)
Sodium: 138 mmol/L (ref 134–144)

## 2020-04-25 LAB — PRO B NATRIURETIC PEPTIDE: NT-Pro BNP: 274 pg/mL (ref 0–287)

## 2020-04-25 LAB — MAGNESIUM: Magnesium: 2.2 mg/dL (ref 1.6–2.3)

## 2020-04-30 ENCOUNTER — Encounter: Payer: Self-pay | Admitting: Cardiology

## 2020-04-30 ENCOUNTER — Ambulatory Visit: Payer: 59 | Admitting: Cardiology

## 2020-04-30 ENCOUNTER — Other Ambulatory Visit: Payer: Self-pay

## 2020-04-30 VITALS — BP 113/75 | HR 82 | Temp 98.1°F | Resp 16 | Ht 66.0 in | Wt 141.0 lb

## 2020-04-30 DIAGNOSIS — Z712 Person consulting for explanation of examination or test findings: Secondary | ICD-10-CM

## 2020-04-30 DIAGNOSIS — R0600 Dyspnea, unspecified: Secondary | ICD-10-CM

## 2020-04-30 DIAGNOSIS — R0609 Other forms of dyspnea: Secondary | ICD-10-CM

## 2020-04-30 DIAGNOSIS — I340 Nonrheumatic mitral (valve) insufficiency: Secondary | ICD-10-CM

## 2020-04-30 DIAGNOSIS — I361 Nonrheumatic tricuspid (valve) insufficiency: Secondary | ICD-10-CM

## 2020-04-30 NOTE — Progress Notes (Signed)
Stefanie [name] Date of Birth: Jan 31, 1961 MRN: [id number] Primary Care Provider:Russo, Jonny RuizJohn, MD Former Cardiology Providers: Altamese CarolinaAshton Kelley, APRN, FNP-C Primary Cardiologist: Tessa LernerSunit Cimone Fahey, DO, [facility] (established care 11/15/2019)  Date: 04/30/20 Last Office Visit: 02/16/2020  Chief Complaint  Patient presents with  . Chest pain, unspecified type  . Follow-up    HPI  Stefanie ChampagneRenee [name] Stefanie [name] is a 60 y.o.  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  Patient follows up with the clinic for management of palpitations and effort related dyspnea.    Patient presents today for follow-up for reevaluation of chest pain and shortness of breath.  Since last office visit patient has not had any reoccurrence of chest pain.  And her shortness of breath is chronic and stable.  Since last office visit she has undergone an echocardiogram which noted preserved LVEF, and stable moderate mitral regurgitation without any evidence of pulmonary hypertension.  Her most recent blood work also notes NT proBNP within normal limits.  Currently on Imuran given her CT findings suggestive of cystic and nodular interstitial lung disease with pattern suggestive of lymphoid interstitial pneumonia she follows up with pulmonary medicine and rheumatology.  ALLERGIES: Allergies  Allergen Reactions  . Iron Sucrose Rash    Chest pain  . Morphine     Other reaction(s): Vomiting  . Venofer [Ferric Oxide] Palpitations    Dizziness, sweating, "felt like I was going to pass out" unknown when episode happened, itching  . Azithromycin Other (See Comments)    "couldnt sleep"  . Other     Other reaction(s): Other  (See Comments) Magic mouth wash. Caused tongue to have a burning feeling     MEDICATION LIST PRIOR TO VISIT: Current Outpatient Medications on File Prior to Visit  Medication Sig Dispense Refill  . albuterol (PROVENTIL HFA;VENTOLIN HFA) 108 (90 BASE) MCG/ACT inhaler Inhale 1 puff into the lungs every 6 (six) hours as needed for wheezing or shortness of breath.    . Ascorbic Acid (VITAMIN C) 1000 MG tablet Take 1,000 mg by mouth daily.    [name]. azaTHIOprine (IMURAN) 50 MG tablet Take 2 tablets (100 mg total) by mouth daily. 180 tablet 0  . hydroxychloroquine (PLAQUENIL) 200 MG tablet TAKE 1 TABLET BY MOUTH IN THE MORNING AND 1/2 TABLET EVERY EVENING 135 tablet 0  . levothyroxine (SYNTHROID, LEVOTHROID) 125 MCG tablet Take 125 mcg by mouth at bedtime.     [name]. MITIGARE 0.6 MG CAPS TAKE 1 CAPSULE BY MOUTH EVERY DAY 90 capsule 0  . rizatriptan (MAXALT) 10 MG tablet Take 10 mg by mouth daily as needed for migraine. May repeat in 2 hours if needed    . Turmeric 450 MG CAPS Take by mouth daily.      No current facility-administered medications on file prior to visit.    PAST MEDICAL HISTORY: Past Medical History:  Diagnosis Date  . Anemia   . Arthritis   . Asthma   . Diabetes insipidus (HCC)   . Fibromyalgia   . Lupus (HCC)   . Mitral valve prolapse   . SLE (systemic lupus erythematosus) (HCC) 12/06/2012  . Thyroid disease     PAST SURGICAL HISTORY: Past Surgical History:  Procedure Laterality Date  . PITUITARY EXCISION    . TONSILLECTOMY  FAMILY HISTORY: [name]; Cancer in her maternal grandmother; Diabetes in her father, paternal grandfather, and sister; Healthy in her daughter and son; Heart disease in her father, paternal grandfather, and sister; Hyperlipidemia in her father and paternal grandfather; Hypertension in her father and paternal grandfather; Kidney disease in her sister; Pancreatic disease in her sister;  Stroke in her father.   SOCIAL HISTORY:  The patient  reports that she has never smoked. She has never used smokeless tobacco. She reports that she does not drink alcohol and does not use drugs.  Review of Systems  Constitutional: Negative for chills and fever.  HENT: Negative for hoarse voice and nosebleeds.   Eyes: Negative for discharge, double vision and pain.  Cardiovascular: Negative for chest pain, claudication, leg swelling, near-syncope, orthopnea, palpitations, paroxysmal nocturnal dyspnea and syncope.  Respiratory: Positive for shortness of breath (chronic and stable. ). Negative for hemoptysis.   Musculoskeletal: Positive for arthritis, back pain, joint pain and stiffness. Negative for muscle cramps and myalgias.  Gastrointestinal: Negative for abdominal pain, constipation, diarrhea, hematemesis, hematochezia, melena, nausea and vomiting.  Neurological: Negative for dizziness and light-headedness.    PHYSICAL EXAM: Vitals with BMI 04/30/2020 02/27/2020 02/16/2020  Height 5\' 6"  5\' 6"  5\' 6"   Weight 141 lbs 143 lbs 10 oz 144 lbs  BMI 22.77 23.19 23.25  Systolic 113 104  Diastolic 75 71 74  Pulse 82 75 81    CONSTITUTIONAL: Well-developed and well-nourished. No acute distress.  SKIN: Skin is warm and dry. No rash noted. No cyanosis. No pallor. No jaundice HEAD: Normocephalic and atraumatic.  EYES: No scleral icterus MOUTH/THROAT: Moist oral membranes.  NECK: No JVD present. No thyromegaly noted. No carotid bruits  LYMPHATIC: No visible cervical adenopathy.  CHEST Normal respiratory effort. No intercostal retractions  LUNGS: Clear to auscultation bilaterally. No stridor. No wheezes. No rales.  CARDIOVASCULAR: Regular rate and rhythm, positive S1-S2, Holosystolic murmur heard at the apex, no rubs or gallops appreciated. ABDOMINAL: No apparent ascites.  EXTREMITIES: No peripheral edema  HEMATOLOGIC: No significant bruising NEUROLOGIC: Oriented to person, place, and  time. Nonfocal. Normal muscle tone.  PSYCHIATRIC: Normal mood and affect. Normal behavior. Cooperative  CARDIAC DATABASE: EKG: 04/30/2020: Normal sinus rhythm, 81 bpm, normal axis, consider old anteroseptal infarct, without underlying ischemia or injury pattern  Echocardiogram: 04/07/2019: Left ventricle cavity is normal in size and wall thickness. Normal global wall motion. Normal LV systolic function with EF 61%. Normal diastolic filling pattern. Mild prolapse of the mitral valve leaflets. Moderate (Grade III) mitral regurgitation. Moderate to severe tricuspid regurgitation. Estimated pulmonary artery systolic pressure is 27 mmHg. Compared to prior echo 05/08/2016: LVEF 55-60%, normal diastolic function, mild to moderate MR, mild TR  03/04/2020: 1. Left ventricle cavity is normal in size and wall thickness. Normal global wall motion. Normal LV systolic function with EF 56%. Normal diastolic filling pattern. 2. Trileaflet aortic valve with mild aortic valve leaflet calcification. No regurgitation. 3. Chordal calcification. Moderate mitral regurgitation. 4. Mild tricuspid regurgitation. 5. No evidence of pulmonary hypertension.  Stress Testing:  Lexiscan 05/08/2016:  Nuclear stress EF: 64%.  There was no ST segment deviation noted during stress.  Defect 1: There is a medium defect of moderate severity present in the mid anterior and apical anterior location.  The study is normal.  This is a low risk study.  The left ventricular ejection fraction is normal (55-65%)  Low risk stress nuclear study with breast attenuation; no ischemia;  EF 64 with normal wall motion.  Heart Catheterization: None  30-day event monitor 6/17-7/16/2020: Normal sinus rhythm. One auto detected event occurred on day 30 at 1647 for sinus tachycardia at 149 bpm. No patient triggered events occurred. Fastest heart rate was 149 bpm. No A. fib or SVT was noted.  LABORATORY DATA: CBC Latest Ref Rng & Units  02/16/2020 01/11/2020 10/16/2019  WBC 3.8 - 10.8 Thousand/uL 4.6 4.7 4.3  Hemoglobin 11.7 - 15.5 g/dL 26.3 78.5 88.5  Hematocrit 35.0 - 45.0 % 38.5 40.1 38.7  Platelets 140 - 400 Thousand/uL 221 261 226    CMP Latest Ref Rng & Units 04/24/2020 02/16/2020 01/11/2020  Glucose 65 - 99 mg/dL 78 84 82  BUN 8 - 27 mg/dL 13 16 13   Creatinine 0.57 - 1.00 mg/dL 0.27 7.41  Sodium 134 - 144 mmol/L 138 139 138  Potassium 3.5 - 5.2 mmol/L 4.1 4.2 4.3  Chloride 96 - 106 mmol/L 99 103 102  CO2 20 - 29 mmol/L 22 28 27   Calcium 8.7 - 10.3 mg/dL 9.5 9.7 9.7  Total Protein 6.1 - 8.1 g/dL - 7.6 7.5  Total Bilirubin 0.2 - 1.2 mg/dL - 0.5 0.5  Alkaline Phos 38 - 126 U/L - - -  AST 10 - 35 U/L - 19 23  ALT 6 - 29 U/L - 17 15   BNP (last 3 results) No results for input(s): BNP in the last 8760 hours.  ProBNP (last 3 results) Recent Labs    04/24/20 0946  PROBNP 274    Cardiac Panel (last 3 results) No results for input(s): CKTOTAL, CKMB, TROPONINIHS, RELINDX in the last 72 hours.  IMPRESSION:    ICD-10-CM   1. Dyspnea on exertion  R06.00 EKG 12-Lead  2. Moderate mitral regurgitation  I34.0   3. Nonrheumatic tricuspid valve regurgitation  I36.1   4. Encounter to discuss test results  Z71.2      RECOMMENDATIONS: Kavina Cantave is a 60 y.o. 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  Since last office visit she has not had any reoccurrence of chest pain.-Dyspnea on exertion is relatively stable.  Given her underlying valvular heart disease recommended a repeat echocardiogram to reevaluate the severity of mitral regurgitation.  She continues to have moderate mitral regurgitation and preserved LVEF.  In addition, given her recent diagnosis of interstitial lung disease and a dyspnea  on exertion the clinical suspicion of pulmonary hypertension still persists.  Therefore echocardiogram also was ordered to reevaluate her RVSP.  Based on the RVSP patient does not have evidence of pulmonary hypertension per echocardiographic findings.  Symptomatically patient remained stable.  Her NT proBNP is also within normal limits.   Symptomatically patient is doing well.  No additional cardiovascular testing needed at this time.  We discussed considering a nuclear stress test since her last evaluation was back in 2018.  However the shared decision was to hold off at the current time as her symptoms have remained stable for several months without progression.   Patient is instructed to follow-up with her rheumatologist and pulmonary medicine as scheduled.  I will see her back in 6 months or sooner if needed.  I did not change any medications or order any additional diagnostic testing during today's encounter.  FINAL MEDICATION LIST END OF ENCOUNTER: No orders of the defined types were placed in this encounter.  There are no discontinued medications.   Current Outpatient Medications:  .  albuterol (PROVENTIL HFA;VENTOLIN HFA) 108 (90 BASE) MCG/ACT inhaler, Inhale 1 puff into the lungs every 6 (six) hours as needed for wheezing or shortness of breath., Disp: , Rfl:  .  Ascorbic Acid (VITAMIN C) 1000 MG tablet, Take 1,000 mg by mouth daily., Disp: , Rfl:  .  azaTHIOprine (IMURAN) 50 MG tablet, Take 2 tablets (100 mg total) by mouth daily., Disp: 180 tablet, Rfl: 0 .  hydroxychloroquine (PLAQUENIL) 200 MG tablet, TAKE 1 TABLET BY MOUTH IN THE MORNING AND 1/2 TABLET EVERY EVENING, Disp: 135 tablet, Rfl: 0 .  levothyroxine (SYNTHROID, LEVOTHROID) 125 MCG tablet, Take 125 mcg by mouth at bedtime. , Disp: , Rfl:  .  MITIGARE 0.6 MG CAPS, TAKE 1 CAPSULE BY MOUTH EVERY DAY, Disp: 90 capsule, Rfl: 0 .  rizatriptan (MAXALT) 10 MG tablet, Take 10 mg by mouth daily as needed for migraine. May repeat in 2  hours if needed, Disp: , Rfl:  .  Turmeric 450 MG CAPS, Take by mouth daily. , Disp: , Rfl:   Orders Placed This Encounter  Procedures  . EKG 12-Lead   --Continue cardiac medications as reconciled in final medication list. --Return in about 6 months (around 10/28/2020) for Follow up, Dyspnea. Or sooner if needed. --Continue follow-up with your primary care physician regarding the management of your other chronic comorbid conditions.  Patient's questions and concerns were addressed to her satisfaction. She voices understanding of the instructions provided during this encounter.   This note was created using a voice recognition software as a result there may be grammatical errors inadvertently enclosed that do not reflect the nature of this encounter. Every attempt is made to correct such errors.  Tessa Lerner, [place], [facility]  Pager: [phone] Office: [phone]

## 2020-05-03 ENCOUNTER — Ambulatory Visit: Payer: 59 | Admitting: Cardiology

## 2020-05-15 ENCOUNTER — Emergency Department (HOSPITAL_BASED_OUTPATIENT_CLINIC_OR_DEPARTMENT_OTHER): Payer: 59

## 2020-05-15 ENCOUNTER — Encounter (HOSPITAL_BASED_OUTPATIENT_CLINIC_OR_DEPARTMENT_OTHER): Payer: Self-pay

## 2020-05-15 ENCOUNTER — Emergency Department (HOSPITAL_BASED_OUTPATIENT_CLINIC_OR_DEPARTMENT_OTHER)
Admission: EM | Admit: 2020-05-15 | Discharge: 2020-05-15 | Disposition: A | Discharge status: Home / Self Care | Payer: 59 | Attending: Emergency Medicine | Admitting: Emergency Medicine

## 2020-05-15 ENCOUNTER — Other Ambulatory Visit: Payer: Self-pay

## 2020-05-15 DIAGNOSIS — E860 Dehydration: Secondary | ICD-10-CM

## 2020-05-15 DIAGNOSIS — R112 Nausea with vomiting, unspecified: Secondary | ICD-10-CM | POA: Diagnosis not present

## 2020-05-15 DIAGNOSIS — Z79899 Other long term (current) drug therapy: Secondary | ICD-10-CM | POA: Insufficient documentation

## 2020-05-15 DIAGNOSIS — R5383 Other fatigue: Secondary | ICD-10-CM | POA: Diagnosis not present

## 2020-05-15 DIAGNOSIS — E039 Hypothyroidism, unspecified: Secondary | ICD-10-CM | POA: Diagnosis not present

## 2020-05-15 DIAGNOSIS — R1011 Right upper quadrant pain: Secondary | ICD-10-CM | POA: Insufficient documentation

## 2020-05-15 DIAGNOSIS — J45909 Unspecified asthma, uncomplicated: Secondary | ICD-10-CM | POA: Diagnosis not present

## 2020-05-15 DIAGNOSIS — R509 Fever, unspecified: Secondary | ICD-10-CM | POA: Insufficient documentation

## 2020-05-15 LAB — COMPREHENSIVE METABOLIC PANEL
ALT: 18 U/L (ref 0–44)
AST: 25 U/L (ref 15–41)
Albumin: 4.1 g/dL (ref 3.5–5.0)
Alkaline Phosphatase: 82 U/L (ref 38–126)
Anion gap: 14 (ref 5–15)
BUN: 20 mg/dL (ref 6–20)
CO2: 22 mmol/L (ref 22–32)
Calcium: 9.1 mg/dL (ref 8.9–10.3)
Chloride: 100 mmol/L (ref 98–111)
Creatinine, Ser: 0.73 mg/dL (ref 0.44–1.00)
GFR, Estimated: >60 mL/min (ref >60)
Glucose, Bld: 98 mg/dL (ref 70–99)
Potassium: 3.4 mmol/L — ABNORMAL LOW (ref 3.5–5.1)
Sodium: 136 mmol/L (ref 135–145)
Total Bilirubin: 1.1 mg/dL (ref 0.3–1.2)
Total Protein: 7.6 g/dL (ref 6.5–8.1)

## 2020-05-15 LAB — URINALYSIS, ROUTINE W REFLEX MICROSCOPIC
Bilirubin Urine: NEGATIVE
Glucose, UA: NEGATIVE mg/dL
Hgb urine dipstick: NEGATIVE
Ketones, ur: >80 mg/dL — AB
Leukocytes,Ua: NEGATIVE
Nitrite: NEGATIVE
Protein, ur: NEGATIVE mg/dL
Specific Gravity, Urine: 1.02 (ref 1.005–1.030)
pH: 6 (ref 5.0–8.0)

## 2020-05-15 LAB — LIPASE, BLOOD: Lipase: 30 U/L (ref 11–51)

## 2020-05-15 LAB — CBC
HCT: 40.2 % (ref 36.0–46.0)
Hemoglobin: 13.5 g/dL (ref 12.0–15.0)
MCH: 29.7 pg (ref 26.0–34.0)
MCHC: 33.6 g/dL (ref 30.0–36.0)
MCV: 88.5 fL (ref 80.0–100.0)
Platelets: 219 10*3/uL (ref 150–400)
RBC: 4.54 MIL/uL (ref 3.87–5.11)
RDW: 13.8 % (ref 11.5–15.5)
WBC: 7.4 10*3/uL (ref 4.0–10.5)
nRBC: 0 % (ref 0.0–0.2)

## 2020-05-15 LAB — LACTIC ACID, PLASMA: Lactic Acid, Venous: 1.2 mmol/L (ref 0.5–1.9)

## 2020-05-15 LAB — MAGNESIUM: Magnesium: 1.7 mg/dL (ref 1.7–2.4)

## 2020-05-15 LAB — TROPONIN I (HIGH SENSITIVITY): Troponin I (High Sensitivity): 3 ng/L (ref <18)

## 2020-05-15 MED ORDER — ONDANSETRON 4 MG PO TBDP
4.0000 mg | ORAL_TABLET | Freq: Three times a day (TID) | ORAL | 0 refills | Status: DC | PRN
Start: 2020-05-15 — End: 2020-10-25

## 2020-05-15 MED ORDER — IOHEXOL 9 MG/ML PO SOLN
500.0000 mL | ORAL | Status: DC
  Administered 2020-05-15: 500 mL via ORAL

## 2020-05-15 MED ORDER — SODIUM CHLORIDE 0.9 % IV BOLUS
1000.0000 mL | Freq: Once | INTRAVENOUS | Status: AC
  Administered 2020-05-15: 1000 mL via INTRAVENOUS

## 2020-05-15 MED ORDER — IOHEXOL 300 MG/ML  SOLN
100.0000 mL | Freq: Once | INTRAMUSCULAR | Status: AC | PRN
  Administered 2020-05-15: 100 mL via INTRAVENOUS

## 2020-05-15 MED ORDER — ONDANSETRON 4 MG PO TBDP
8.0000 mg | ORAL_TABLET | Freq: Once | ORAL | Status: AC
  Administered 2020-05-15: 8 mg via ORAL
  Filled 2020-05-15: qty 2

## 2020-05-15 MED ORDER — ONDANSETRON HCL 4 MG/2ML IJ SOLN
4.0000 mg | Freq: Once | INTRAMUSCULAR | Status: AC
  Administered 2020-05-15: 4 mg via INTRAVENOUS
  Filled 2020-05-15: qty 2

## 2020-05-15 NOTE — ED Provider Notes (Signed)
[facility] EMERGENCY DEPARTMENT Provider Note   CSN: [id number] Arrival date & time: 05/15/20  1720     History Chief Complaint  Patient presents with  . Abdominal Pain    Stefanie [name] is a 60 y.o. female.  She is here with acute onset of fever to maximum 101.7, nausea and vomiting multiple times along with abdominal bloating and pain right upper quadrant wrapping around to her back.  No diarrhea.  No cough or shortness of breath.  Has a history of low sodium when she gets sick.  Has not been able to take her medication today.  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  No sick contacts or recent travel.  No abdominal surgery.  The history is provided by the patient.  Abdominal Pain Pain location:  RUQ Pain quality: stabbing   Pain radiates to:  Back Pain severity:  Severe Onset quality:  Gradual Progression:  Partially resolved Chronicity:  New Context: not recent illness, not recent travel, not sick contacts and not trauma   Relieved by:  Nothing Worsened by:  Nothing Ineffective treatments:  None tried Associated symptoms: fatigue, fever, nausea and vomiting   Associated symptoms: no chest pain, no constipation, no cough, no diarrhea, no dysuria, no hematemesis, no hematochezia, no hematuria, no shortness of breath and no sore throat        Past Medical History:  Diagnosis Date  . Anemia   . Arthritis   . Asthma   . Diabetes insipidus (HCC)   . Fibromyalgia   . Lupus (HCC)   . Mitral valve prolapse   . SLE (systemic lupus erythematosus) (HCC) 12/06/2012  . Thyroid disease     Patient Active Problem List   Diagnosis Date Noted  . 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 Gasping for breath 05/10/2018  . Bursitis, ischial, right 10/08/2016  . 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 Fibromyalgia 10/08/2016  . Other fatigue 10/08/2016  . DDD (degenerative disc disease), lumbar/ and scoliosis 10/08/2016  . Facial droop   .  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 Prolonged QT interval 07/28/2016  . Primary osteoarthritis of both hands 05/11/2016  . Primary osteoarthritis of both knees 05/11/2016  . History of hypothyroidism 05/11/2016  . History of migraine 05/11/2016  . 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 ANA positive 04/29/2016  . Rheumatoid factor positive 04/29/2016  . Pseudogout 04/29/2016  . Chondrocalcinosis 04/29/2016  . High risk medication use 04/29/2016  . Sjogren's syndrome with keratoconjunctivitis sicca (HCC) 04/29/2016  . History of neutropenia 04/29/2016  . 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  . Hyponatremia 12/12/2013  . Fever 12/13/2012  . Pleuritic chest pain 12/06/2012  . 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 Anxiety 12/06/2012  . ANEMIA-IRON DEFICIENCY 01/08/2010  . GASTRIC DILATION, ACUTE 01/08/2010  . NAUSEA ALONE 01/08/2010  . ABDOMINAL PAIN, LEFT UPPER QUADRANT 01/08/2010    Past Surgical History:  Procedure Laterality Date  . PITUITARY EXCISION    . TONSILLECTOMY       OB History   No obstetric history on file.     Family History  Problem Relation Age of Onset  . Atrial fibrillation Mother   . Diabetes Father   . Heart disease Father   . Hyperlipidemia Father   . Hypertension Father   . Stroke Father   . Diabetes Sister   . Kidney disease Sister        transplant  .  Heart disease Sister        open heart surgery   . Pancreatic disease Sister        transplant   . Cancer Maternal Grandmother   . Diabetes Paternal Grandfather   . Heart disease Paternal Grandfather   . Hyperlipidemia Paternal Grandfather   . Hypertension Paternal Grandfather   . Healthy Daughter   . Healthy Son     Social History   Tobacco Use  . Smoking status: Never Smoker  . Smokeless tobacco: Never Used  Vaping Use  . Vaping Use: Never used  Substance Use  Topics  . Alcohol use: No  . Drug use: No    Home Medications Prior to Admission medications   Medication Sig Start Date End Date Taking? Authorizing Provider  albuterol (PROVENTIL HFA;VENTOLIN HFA) 108 (90 BASE) MCG/ACT inhaler Inhale 1 puff into the lungs every 6 (six) hours as needed for wheezing or shortness of breath.    [provider]  Ascorbic Acid (VITAMIN C) 1000 MG tablet Take 1,000 mg by mouth daily.    [provider]  azaTHIOprine (IMURAN) 50 MG tablet Take 2 tablets (100 mg total) by mouth daily. 03/06/20   Gearldine Bienenstock, PA-C  hydroxychloroquine (PLAQUENIL) 200 MG tablet TAKE 1 TABLET BY MOUTH IN THE MORNING AND 1/2 TABLET EVERY EVENING 02/08/20   Pollyann Savoy, MD  levothyroxine (SYNTHROID, LEVOTHROID) 125 MCG tablet Take 125 mcg by mouth at bedtime.  06/14/14   [provider]  MITIGARE 0.6 MG CAPS TAKE 1 CAPSULE BY MOUTH EVERY DAY 03/18/20   Gearldine Bienenstock, PA-C  rizatriptan (MAXALT) 10 MG tablet Take 10 mg by mouth daily as needed for migraine. May repeat in 2 hours if needed    [provider]  Turmeric 450 MG CAPS Take by mouth daily.     [provider]    Allergies    Iron sucrose, Morphine, Venofer [ferric oxide], Azithromycin, and Other  Review of Systems   Review of Systems  Constitutional: Positive for fatigue and fever.  HENT: Negative for sore throat.   Eyes: Negative for visual disturbance.  Respiratory: Negative for cough and shortness of breath.   Cardiovascular: Negative for chest pain.  Gastrointestinal: Positive for abdominal pain, nausea and vomiting. Negative for constipation, diarrhea, hematemesis and hematochezia.  Genitourinary: Negative for dysuria and hematuria.  Musculoskeletal: Negative for neck pain.  Skin: Negative for rash.  Neurological: Negative for headaches.    Physical Exam Updated Vital Signs BP 120/70 (BP Location: Left Arm)   Pulse 94   Temp 99.6 F (37.6 C) (Oral)   Resp (!)  22   Ht 5\' 6"  (1.676 m)   Wt 64 kg   LMP 04/09/2014 (Approximate)   SpO2 100%   BMI 22.76 kg/m   Physical Exam Vitals and nursing note reviewed.  Constitutional:      General: She is not in acute distress.    Appearance: Normal appearance. She is well-developed and well-nourished.  HENT:     Head: Normocephalic and atraumatic.  Eyes:     Conjunctiva/sclera: Conjunctivae normal.  Cardiovascular:     Rate and Rhythm: Normal rate and regular rhythm.     Heart sounds: No murmur heard.   Pulmonary:     Effort: Pulmonary effort is normal. No respiratory distress.     Breath sounds: Normal breath sounds.  Abdominal:     Palpations: Abdomen is soft.     Tenderness: There is no abdominal tenderness. There is  no guarding or rebound.  Musculoskeletal:        General: No deformity, signs of injury or edema. Normal range of motion.     Cervical back: Neck supple.     Right lower leg: No edema.     Left lower leg: No edema.  Skin:    General: Skin is warm and dry.  Neurological:     General: No focal deficit present.     Mental Status: She is alert.     Sensory: No sensory deficit.     Motor: No weakness.  Psychiatric:        Mood and Affect: Mood and affect normal.     ED Results / Procedures / Treatments   Labs (all labs ordered are listed, but only abnormal results are displayed) Labs Reviewed  COMPREHENSIVE METABOLIC PANEL - Abnormal; Notable for the following components:      Result Value   Potassium 3.4 (*)    All other components within normal limits  URINALYSIS, ROUTINE W REFLEX MICROSCOPIC - Abnormal; Notable for the following components:   Ketones, ur >80 (*)    All other components within normal limits  CULTURE, BLOOD (ROUTINE X 2)  CULTURE, BLOOD (ROUTINE X 2)  LIPASE, BLOOD  CBC  LACTIC ACID, PLASMA  MAGNESIUM  TROPONIN I (HIGH SENSITIVITY)    EKG EKG Interpretation  Date/Time:  Wednesday May 15 2020 18:55:27 EST Ventricular Rate:  90 PR  Interval:    QRS Duration: 93 QT Interval:  350 QTC Calculation: 429 R Axis:   67 Text Interpretation: Sinus rhythm Consider left atrial enlargement Borderline T wave abnormalities Confirmed by Meridee Score [phone]) on 05/15/2020 7:01:23 PM   Radiology CT Abdomen Pelvis W Contrast  Result Date: 05/15/2020 CLINICAL DATA:  Acute nonlocalized abdominal pain. Nausea and vomiting. EXAM: CT ABDOMEN AND PELVIS WITH CONTRAST TECHNIQUE: Multidetector CT imaging of the abdomen and pelvis was performed using the standard protocol following bolus administration of intravenous contrast. CONTRAST:  OMNIPAQUE IOHEXOL 300 MG/ML  SOLN COMPARISON:  CT 01/09/2010 FINDINGS: Lower chest: Small cysts at the lung bases that are similar to prior chest CT. No acute airspace disease. Normal heart size. Hepatobiliary: No focal liver abnormality is seen. No gallstones, gallbladder wall thickening, or biliary dilatation. Pancreas: No ductal dilatation or inflammation. Spleen: Normal in size without focal abnormality. Adrenals/Urinary Tract: Normal adrenal glands. No hydronephrosis or perinephric edema. No visualized renal calculi or focal mass. Homogeneous renal enhancement. Urinary bladder is partially distended without wall thickening. Stomach/Bowel: Small hiatal hernia containing small amount of enteric contrast. Stomach otherwise unremarkable. No small bowel obstruction, wall thickening, or inflammation. Normal appendix, for example series 2, image 64. Small to moderate colonic stool burden without colonic wall thickening or inflammatory change. Vascular/Lymphatic: Mild aortic atherosclerosis. No aortic aneurysm. Patent portal vein. No acute vascular findings. No abdominopelvic adenopathy. Reproductive: Uterus and bilateral adnexa are unremarkable. Other: No free air, free fluid, or intra-abdominal fluid collection. Musculoskeletal: There are no acute or suspicious osseous abnormalities. Degenerative disc disease at L5-S1.  IMPRESSION: 1. No acute abnormality in the abdomen/pelvis. 2. Small hiatal hernia. Contrast in the distal esophagus can be seen with delayed transit or reflux. Aortic Atherosclerosis (ICD10-I70.0). Electronically Signed   By: Narda Rutherford M.D.   On: 05/15/2020 20:41    Procedures Procedures   Medications Ordered in ED Medications  sodium chloride 0.9 % bolus 1,000 mL ( Intravenous Stopped 05/15/20 2038)  ondansetron (ZOFRAN) injection 4 mg (4 mg Intravenous Given 05/15/20 1931)  iohexol (OMNIPAQUE) 300 MG/ML solution 100 mL (100 mLs Intravenous Contrast Given 05/15/20 2021)  ondansetron (ZOFRAN-ODT) disintegrating tablet 8 mg (8 mg Oral Given 05/15/20 2210)    ED Course  I have reviewed the triage vital signs and the nursing notes.  Pertinent labs & imaging results that were available during my care of the patient were reviewed by me and considered in my medical decision making (see chart for details).  Clinical Course as of 05/16/20 1032  Wed May 15, 2020  2054 Patient's work-up is been fairly unremarkable other than mildly low potassium.  And ketones in the urine.  She is tolerating some p.o. here.  Likely will send home with some Zofran. [MB]  2205 Doses of Zofran nowPatient is tolerating p.o. and is clinically feeling better.  She is comfortable plan for discharge.  We will give her an oral Dose of some Zofran now and prescription to her pharmacy.   [MB]    Clinical Course User Index [MB] Terrilee Files, MD   MDM Rules/Calculators/A&P                         Clinical Course as of 05/16/20 1032  Wed May 15, 2020  2054 Patient's work-up is been fairly unremarkable other than mildly low potassium.  And ketones in the urine.  She is tolerating some p.o. here.  Likely will send home with some Zofran. [MB]  2205 Doses of Zofran nowPatient is tolerating p.o. and is clinically feeling better.  She is comfortable plan for discharge.  We will give her an oral Dose of some Zofran now and  prescription to her pharmacy.   [MB]    Clinical Course User Index [MB] Terrilee Files, MD   This patient complains of vomiting and abdominal pain; this involves an extensive number of treatment Options and is a complaint that carries with it a high risk of complications and Morbidity. The differential includes a GE, obstruction, gastritis, diverticulitis, cholelithiasis  I ordered, reviewed and interpreted labs, which included CBC with normal white count normal hemoglobin, chemistries fairly normal other than mildly low potassium prominence flat, lactate normal, urinalysis with ketones no signs of infection I ordered medication IV fluids IV Zofran I ordered imaging studies which included CT abdomen pelvis and I independently    visualized and interpreted imaging which showed no acute findings Additional history obtained from patient's husband Previous records obtained and reviewed in epic, no recent admissions  After the interventions stated above, I reevaluated the patient and found patient tolerating p.o. and symptomatically improved.  Reviewed work-up with her and her husband.  They are comfortable plan for trial at home with oral rehydration and symptomatic treatment with Zofran.  Return instructions discussed.  Of note patient has history of QTC prolongation.  QTc normal currently so think trial of Zofran is reasonable in the setting.  Recommended the patient contact her PCP for further recommendations.   Final Clinical Impression(s) / ED Diagnoses Final diagnoses:  Non-intractable vomiting with nausea, unspecified vomiting type  Dehydration    Rx / DC Orders ED Discharge Orders         Ordered    ondansetron (ZOFRAN ODT) 4 MG disintegrating tablet  Every 8 hours PRN        05/15/20 2115           Terrilee Files, MD 05/16/20 1035

## 2020-05-15 NOTE — Discharge Instructions (Addendum)
You were seen in the emergency department for evaluation of nausea vomiting and abdominal pain.  You had blood work that showed you were dehydrated.  He had a CAT scan of your abdomen and pelvis that did not show any significant abnormalities.  Your symptoms improved with some fluids and IV medications.  We are sending you home with a prescription for nausea medication.  Please follow-up with your primary care doctor.  Return to the emergency department with any worsening or concerning symptoms

## 2020-05-15 NOTE — ED Triage Notes (Signed)
Pt c/o abd pain, n/v since 630am-NAD-to triage in w/c

## 2020-05-15 NOTE — ED Notes (Addendum)
Pt unable to provide urine at this time at triage. 

## 2020-05-17 NOTE — Progress Notes (Signed)
Office Visit Note  Patient: Stefanie [name]             Date of Birth: Jul 20, 1960           MRN: [id number]             PCP: Shon Baton, MD Referring: Shon Baton, MD Visit Date: 05/31/2020 Occupation: '@GUAROCC' @  Subjective:  Pain in both knee joints   History of Present Illness: Stefanie [name] is a 60 y.o. female with history of sjogren's syndrome, osteoarthritis, and fibromyalgia. She is taking imuran 50 mg 2 tablet by mouth daily and Plaquenil 1 tablet in the morning and half tablet in the evening.  She continues to tolerate both medications without any side effects.  She denies any signs or symptoms of a flare recently.  She has noticed significant clinical improvement on combination therapy.  She continues to have intermittent facial rashes, sicca symptoms, Raynaud's phenomenon, and arthralgias. She continues to have pain and intermittent swelling in both hands and both knee joints.  She continues to take colchicine 0.6 mg 1 capsule by mouth daily. She states the swelling and stiffness in both hands is worse in the morning.  She denies any hair loss.  She has not had any shortness of breath or pleuritic chest pain.  She has a follow-up appointment scheduled with Dr. Vaughan Browner and repeat PFTs in April 2022.  She denies any swollen lymph nodes.   Patient reports that she was evaluated in the ED on 05/15/2020 due to experiencing abdominal pain, nausea, vomiting multiples. She did not experience any diarrhea. She was treated with IV zofran and IV fluids.  Her symptoms have resolved and her appetite has gradually improved.      Activities of Daily Living:  Patient reports morning stiffness for 45 minutes.   Patient Denies nocturnal pain.  Difficulty dressing/grooming: Denies Difficulty climbing stairs: Reports Difficulty getting out of chair: Reports Difficulty using hands for taps, buttons, cutlery, and/or writing: Reports  Review of Systems  Constitutional: Positive for fatigue.   HENT: Positive for mouth dryness and nose dryness. Negative for mouth sores.   Eyes: Positive for dryness. Negative for pain, itching and visual disturbance.  Respiratory: Negative for cough, hemoptysis, shortness of breath and difficulty breathing.   Cardiovascular: Positive for chest pain. Negative for palpitations, hypertension and swelling in legs/feet.  Gastrointestinal: Negative for blood in stool, constipation and diarrhea.  Endocrine: Negative for increased urination.  Genitourinary: Negative for difficulty urinating and painful urination.  Musculoskeletal: Positive for arthralgias, joint pain, joint swelling and morning stiffness. Negative for myalgias, muscle weakness, muscle tenderness and myalgias.  Skin: Positive for color change. Negative for pallor, rash, hair loss, nodules/bumps, redness, skin tightness, ulcers and sensitivity to sunlight.  Allergic/Immunologic: Negative for susceptible to infections.  Neurological: Negative for dizziness, numbness, headaches, memory loss and weakness.  Hematological: Positive for bruising/bleeding tendency. Negative for swollen glands.  Psychiatric/Behavioral: Negative for depressed mood, confusion and sleep disturbance. The patient is nervous/anxious.     PMFS History:  Patient Active Problem List   Diagnosis Date Noted  . 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 Gasping for breath 05/10/2018  . Bursitis, ischial, right 10/08/2016  . 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 Fibromyalgia 10/08/2016  . Other fatigue 10/08/2016  . DDD (degenerative disc disease), lumbar/ and scoliosis 10/08/2016  . Facial droop   . 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 Prolonged QT interval 07/28/2016  . Primary osteoarthritis of both hands 05/11/2016  .  Primary osteoarthritis of both knees 05/11/2016  . History of hypothyroidism 05/11/2016  . History of migraine 05/11/2016  . 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 ANA positive 04/29/2016  . Rheumatoid factor positive 04/29/2016  . Pseudogout 04/29/2016  . Chondrocalcinosis 04/29/2016  . High risk medication use 04/29/2016  . Sjogren's syndrome with keratoconjunctivitis sicca ([place]) 04/29/2016  . History of neutropenia 04/29/2016  . 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  . Hyponatremia 12/12/2013  . Fever 12/13/2012  . Pleuritic chest pain 12/06/2012  . 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 Anxiety 12/06/2012  . ANEMIA-IRON DEFICIENCY 01/08/2010  . GASTRIC DILATION, ACUTE 01/08/2010  . NAUSEA ALONE 01/08/2010  . ABDOMINAL PAIN, LEFT UPPER QUADRANT 01/08/2010    Past Medical History:  Diagnosis Date  . Anemia   . Arthritis   . Asthma   . Diabetes insipidus ([place])   . Fibromyalgia   . Lupus ([place])   . Mitral valve prolapse   . SLE (systemic lupus erythematosus) ([place]) 12/06/2012  . Thyroid disease     Family History  Problem Relation Age of Onset  . Atrial fibrillation Mother   . Diabetes Father   . Heart disease Father   . Hyperlipidemia Father   . Hypertension Father   . Stroke Father   . Diabetes Sister   . Kidney disease Sister        transplant  . Heart disease Sister        open heart surgery   . Pancreatic disease Sister        transplant   . Cancer Maternal Grandmother   . Diabetes Paternal Grandfather   . Heart disease Paternal Grandfather   . Hyperlipidemia Paternal Grandfather   . Hypertension Paternal Grandfather   . Healthy Daughter   . Healthy Son    Past Surgical History:  Procedure Laterality Date  . PITUITARY EXCISION    . TONSILLECTOMY     Social History   Social History Narrative  . Not on file   Immunization History  Administered Date(s) Administered  . Influenza-Unspecified 09/28/2018  . PFIZER(Purple Top)SARS-COV-2 Vaccination 11/17/2019, 12/13/2019     Objective: Vital Signs: BP 115/78  (BP Location: Left Arm, Patient Position: Sitting, Cuff Size: Normal)   Pulse 68   Resp 14   Ht '5\' 6"'  (1.676 m)   Wt 140 lb (63.5 kg)   LMP 04/09/2014 (Approximate)   BMI 22.60 kg/m    Physical Exam Vitals and nursing note reviewed.  Constitutional:      Appearance: She is well-developed.  HENT:     Head: Normocephalic and atraumatic.  Eyes:     Conjunctiva/sclera: Conjunctivae normal.  Pulmonary:     Effort: Pulmonary effort is normal.  Abdominal:     Palpations: Abdomen is soft.  Musculoskeletal:     Cervical back: Normal range of motion.  Skin:    General: Skin is warm and dry.     Capillary Refill: Capillary refill takes less than 2 seconds.  Neurological:     Mental Status: She is alert and oriented to person, place, and time.  Psychiatric:        Behavior: Behavior normal.      Musculoskeletal Exam: C-spine, thoracic spine, and lumbar spine good ROM.  Shoulder joints, elbow joints, wrist joints, MCPs, PIPs, and DIPs good ROM with no synovitis. Complete fist formation bilaterally.  PIP and DIP thickening consistent with osteoarthritis of both hands.  Hip joints good ROM with no discomfort.  Small left knee joint effusion. Mild valgus deformity of left knee.  Ankle joints good ROM with no tenderness or swelling.   Pitting edema noted in bilateral LE.  CDAI Exam: CDAI Score: -- Patient Global: --; Provider Global: -- Swollen: --; Tender: -- Joint Exam 05/31/2020   No joint exam has been documented for this visit   There is currently no information documented on the homunculus. Go to the Rheumatology activity and complete the homunculus joint exam.  Investigation: No additional findings.  Imaging: CT Abdomen Pelvis W Contrast  Result Date: 05/15/2020 CLINICAL DATA:  Acute nonlocalized abdominal pain. Nausea and vomiting. EXAM: CT ABDOMEN AND PELVIS WITH CONTRAST TECHNIQUE: Multidetector CT imaging of the abdomen and pelvis was performed using the standard protocol  following bolus administration of intravenous contrast. CONTRAST:  177m OMNIPAQUE IOHEXOL 300 MG/ML  SOLN COMPARISON:  CT 01/09/2010 FINDINGS: Lower chest: Small cysts at the lung bases that are similar to prior chest CT. No acute airspace disease. Normal heart size. Hepatobiliary: No focal liver abnormality is seen. No gallstones, gallbladder wall thickening, or biliary dilatation. Pancreas: No ductal dilatation or inflammation. Spleen: Normal in size without focal abnormality. Adrenals/Urinary Tract: Normal adrenal glands. No hydronephrosis or perinephric edema. No visualized renal calculi or focal mass. Homogeneous renal enhancement. Urinary bladder is partially distended without wall thickening. Stomach/Bowel: Small hiatal hernia containing small amount of enteric contrast. Stomach otherwise unremarkable. No small bowel obstruction, wall thickening, or inflammation. Normal appendix, for example series 2, image 64. Small to moderate colonic stool burden without colonic wall thickening or inflammatory change. Vascular/Lymphatic: Mild aortic atherosclerosis. No aortic aneurysm. Patent portal vein. No acute vascular findings. No abdominopelvic adenopathy. Reproductive: Uterus and bilateral adnexa are unremarkable. Other: No free air, free fluid, or intra-abdominal fluid collection. Musculoskeletal: There are no acute or suspicious osseous abnormalities. Degenerative disc disease at L5-S1. IMPRESSION: 1. No acute abnormality in the abdomen/pelvis. 2. Small hiatal hernia. Contrast in the distal esophagus can be seen with delayed transit or reflux. Aortic Atherosclerosis (ICD10-I70.0). Electronically Signed   By: MKeith RakeM.D.   On: 05/15/2020 20:41    Recent Labs: Lab Results  Component Value Date   WBC 7.4 05/15/2020   HGB 13.5 05/15/2020   PLT 219 05/15/2020   NA 136 05/15/2020   K 3.4 (L) 05/15/2020   CL 100 05/15/2020   CO2 22 05/15/2020   GLUCOSE 98 05/15/2020   BUN 20 05/15/2020    CREATININE 0.73 05/15/2020   BILITOT 1.1 05/15/2020   ALKPHOS 82 05/15/2020   AST 25 05/15/2020   ALT 18 05/15/2020   PROT 7.6 05/15/2020   ALBUMIN 4.1 05/15/2020   CALCIUM 9.1 05/15/2020   GFRAA 102 04/24/2020   QFTBGOLDPLUS NEGATIVE 10/16/2019    Speciality Comments: [facility]  Procedures:  No procedures performed Allergies: Iron sucrose, Morphine, Venofer [ferric oxide], Azithromycin, and Other   Assessment / Plan:     Visit Diagnoses: Sjogren's syndrome with keratoconjunctivitis sicca ([facility]) - Positive ANA, positive Ro, positive La, positive RF sicca symptoms, inflammatory arthritis: She has noticed significant clinical improvement since starting on Imuran 50 mg 2 tablet by mouth daily and Plaquenil 300 mg daily in November 2021.  She continues to have chronic sicca symptoms which have been tolerable overall.  She has not had any oral or nasal ulcerations.  She experiences intermittent pain and stiffness in both hands and both  knee joints.  Warmth and a small effusion in the left knee was noted on examination today.  She declined a cortisone injection.  We discussed the use of ice, compression, and elevation.  We also discussed the use of arthritis compression gloves.  She continues to have intermittent Raynaud's phenomenon but no digital ulcerations or signs of sclerodactyly were noted.  She experiences facial flushing most afternoons and is unsure if it is a malar rash or due to wearing a mask.  She has not noticed any other rashes recently.  No hair loss.  Lab work from 10/16/2019 was reviewed today in the office: RF+, Ro+, ESR WNL,  Complements WNL, and dsDNA negative.  CBC and CMP were updated on 05/15/2020 while being evaluated in the ED for vomiting multiple times.  She will be due to update routine lab work in June and every 3 months to monitor for drug toxicity.  Future orders for autoimmune lab work was placed today.  She  was advised to notify us if she develops signs or symptoms of a flare.  She will follow-up in the office in 5 months.- Plan: Sjogrens syndrome-A extractable nuclear antibody, Anti-DNA antibody, double-stranded, C3 and C4, Sedimentation rate, Urinalysis, Routine w reflex microscopic, COMPLETE METABOLIC PANEL WITH GFR, CBC with Differential/Platelet, ANA  High risk medication use - Imuran 100 mg po qd, Plaquenil 200 mg 1 tablet in the morning and 1/2 tablet in the evening. PLQ Eye Exam 02/26/2020 WNL @ [facility] follow up in 1 year.  CBC and CMP were drawn on 05/15/2020 and results were reviewed today in the office.  Her next lab work will be due in June and every 3 months to monitor for drug toxicity.  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 Vaughan Browner. High-resolution chest CT ordered on 08/01/2019: Spectrum of findings suggestive of cystic and nodular interstitial lung disease with a pattern most suggestive of LID.   PFTs updated on 10/06/19. She has a follow up appointment scheduled with Dr. Vaughan Browner on 06/14/20 at which time she will be repeating PFTs.  She has not noticed any new or worsening pulmonary symptoms.   Primary osteoarthritis of both hands: She has PIP and DIP thickening consistent with osteoarthritis of both hands.  No joint tenderness or inflammation was noted.  She was able to make a complete fist bilaterally.  We discussed the importance of joint protection and muscle strengthening.  We discussed the importance of performing hand exercises on a daily basis.  We also discussed the use of arthritis compression gloves.  Primary osteoarthritis of both knees: She has good range of motion with some discomfort in the left knee joint on exam.  Mild valgus deformity of the left knee noted.  She has a small effusion left knee joint.  Right knee has no  warmth or effusion.  She declined a left knee joint cortisone injection today.  She was advised to notify us if her discomfort persists or worsens.  She was encouraged to use compression, ice, and elevation.  Chondromalacia patellae, right knee: She has good range of motion of the right knee joint with no warmth or effusion.  Chondrocalcinosis - She has not had any recent flares.  She continues to take colchicine 0.6 mg 1 capsule by mouth daily.    Cervical spondylosis without myelopathy: She has good range of motion of the C-spine on  examination today.  She is not experiencing any symptoms of radiculopathy at this time.  Fibromyalgia: She continues to experience intermittent myalgias and muscle tenderness due to fibromyalgia.  She continues to have persistent fatigue which was exacerbated by her recent gastrointestinal infection.  Discussed the importance of regular exercise and good sleep hygiene.  Other fatigue: Stable  Other medical conditions are listed as follows:   History of diabetes insipidus  History of anxiety  History of migraine  Prolonged QT interval  History of hypothyroidism  Osteoporosis screening - Patient will get DEXA with her PCP.  Orders: Orders Placed This Encounter  Procedures  . Sjogrens syndrome-A extractable nuclear antibody  . Anti-DNA antibody, double-stranded  . C3 and C4  . Sedimentation rate  . Urinalysis, Routine w reflex microscopic  . COMPLETE METABOLIC PANEL WITH GFR  . CBC with Differential/Platelet  . ANA   No orders of the defined types were placed in this encounter.    Follow-Up Instructions: Return in about 5 months (around 10/31/2020) for Sjogren's syndrome, Osteoarthritis, Fibromyalgia.   Ofilia Neas, PA-C  Note - This record has been created using Dragon software.  Chart creation errors have been sought, but may not always  have been located. Such creation errors do not reflect on  the standard of medical care.

## 2020-05-20 LAB — CULTURE, BLOOD (ROUTINE X 2)
Culture: NO GROWTH
Special Requests: ADEQUATE

## 2020-05-30 ENCOUNTER — Other Ambulatory Visit: Payer: Self-pay | Admitting: Physician Assistant

## 2020-05-30 NOTE — Telephone Encounter (Signed)
Next Visit: 05/31/2020  Last Visit: 02/27/2020  Last Fill: 03/06/2020  DX: Sjogren's syndrome with keratoconjunctivitis sicca   Current Dose per office note 02/27/2020, Imuran 100 mg po qd Labs: 05/15/2020, CMP Ketones 80, Potassium 3.4, 02/16/2020 CBC CBC and CMP WNL  Okay to refill Imuran?

## 2020-05-31 ENCOUNTER — Encounter: Payer: Self-pay | Admitting: Physician Assistant

## 2020-05-31 ENCOUNTER — Other Ambulatory Visit: Payer: Self-pay

## 2020-05-31 ENCOUNTER — Ambulatory Visit: Payer: 59 | Admitting: Physician Assistant

## 2020-05-31 VITALS — BP 115/78 | HR 68 | Resp 14 | Ht 66.0 in | Wt 140.0 lb

## 2020-05-31 DIAGNOSIS — M797 Fibromyalgia: Secondary | ICD-10-CM

## 2020-05-31 DIAGNOSIS — J842 Lymphoid interstitial pneumonia: Secondary | ICD-10-CM | POA: Diagnosis not present

## 2020-05-31 DIAGNOSIS — Z13820 Encounter for screening for osteoporosis: Secondary | ICD-10-CM

## 2020-05-31 DIAGNOSIS — R5383 Other fatigue: Secondary | ICD-10-CM

## 2020-05-31 DIAGNOSIS — Z79899 Other long term (current) drug therapy: Secondary | ICD-10-CM

## 2020-05-31 DIAGNOSIS — M19041 Primary osteoarthritis, right hand: Secondary | ICD-10-CM | POA: Diagnosis not present

## 2020-05-31 DIAGNOSIS — M19042 Primary osteoarthritis, left hand: Secondary | ICD-10-CM

## 2020-05-31 DIAGNOSIS — M3501 Sicca syndrome with keratoconjunctivitis: Secondary | ICD-10-CM

## 2020-05-31 DIAGNOSIS — M2241 Chondromalacia patellae, right knee: Secondary | ICD-10-CM

## 2020-05-31 DIAGNOSIS — M170 Bilateral primary osteoarthritis of knee: Secondary | ICD-10-CM

## 2020-05-31 DIAGNOSIS — R9431 Abnormal electrocardiogram [ECG] [EKG]: Secondary | ICD-10-CM

## 2020-05-31 DIAGNOSIS — Z8639 Personal history of other endocrine, nutritional and metabolic disease: Secondary | ICD-10-CM

## 2020-05-31 DIAGNOSIS — Z8669 Personal history of other diseases of the nervous system and sense organs: Secondary | ICD-10-CM

## 2020-05-31 DIAGNOSIS — Z8659 Personal history of other mental and behavioral disorders: Secondary | ICD-10-CM

## 2020-05-31 DIAGNOSIS — M1120 Other chondrocalcinosis, unspecified site: Secondary | ICD-10-CM

## 2020-05-31 DIAGNOSIS — M47812 Spondylosis without myelopathy or radiculopathy, cervical region: Secondary | ICD-10-CM

## 2020-05-31 NOTE — Patient Instructions (Signed)
Standing Labs We placed an order today for your standing lab work.   Please have your standing labs drawn in June and every 3 months   If possible, please have your labs drawn 2 weeks prior to your appointment so that the provider can discuss your results at your appointment.  We have open lab daily Monday through Thursday from 1:30-4:30 PM and Friday from 1:30-4:00 PM at the office of Dr. Shaili Deveshwar, [facility].   Please be advised, all patients with office appointments requiring lab work will take precedents over walk-in lab work.  If possible, please come for your lab work on Monday and Friday afternoons, as you may experience shorter wait times. The office is located at [address], [address], [place], [place] [address] No appointment is necessary.   Labs are drawn by [facility]. Please bring your co-pay at the time of your lab draw.  You may receive a bill from [facility] for your lab work.  If you wish to have your labs drawn at another location, please call the office 24 hours in advance to send orders.  If you have any questions regarding directions or hours of operation,  please call [phone].   As a reminder, please drink plenty of water prior to coming for your lab work. Thanks!   

## 2020-06-11 ENCOUNTER — Other Ambulatory Visit (HOSPITAL_COMMUNITY)
Admission: RE | Admit: 2020-06-11 | Discharge: 2020-06-11 | Disposition: A | Discharge status: Home / Self Care | Payer: 59 | Source: Ambulatory Visit | Attending: Pulmonary Disease | Admitting: Pulmonary Disease

## 2020-06-11 DIAGNOSIS — Z20822 Contact with and (suspected) exposure to covid-19: Secondary | ICD-10-CM | POA: Insufficient documentation

## 2020-06-11 DIAGNOSIS — Z01812 Encounter for preprocedural laboratory examination: Secondary | ICD-10-CM | POA: Diagnosis present

## 2020-06-11 LAB — SARS CORONAVIRUS 2 (TAT 6-24 HRS): SARS Coronavirus 2: NEGATIVE

## 2020-06-13 ENCOUNTER — Other Ambulatory Visit: Payer: Self-pay | Admitting: Rheumatology

## 2020-06-13 DIAGNOSIS — M359 Systemic involvement of connective tissue, unspecified: Secondary | ICD-10-CM

## 2020-06-13 NOTE — Telephone Encounter (Signed)
Last Visit: 05/31/2020 Next Visit: 10/25/2020 Labs: 05/16/2019, Ketones 80, Potassium 3.4,  Eye exam: 02/26/2020  Current Dose per office note 05/31/2020, Plaquenil 200 mg 1 tablet in the morning and 1/2 tablet in the evening DX: Sjogren's syndrome with keratoconjunctivitis sicca   Last Fill: 02/08/2020  Okay to refill Plaquenil?

## 2020-06-14 ENCOUNTER — Encounter: Payer: Self-pay | Admitting: Pulmonary Disease

## 2020-06-14 ENCOUNTER — Ambulatory Visit (INDEPENDENT_AMBULATORY_CARE_PROVIDER_SITE_OTHER): Payer: 59 | Admitting: Pulmonary Disease

## 2020-06-14 ENCOUNTER — Other Ambulatory Visit: Payer: Self-pay

## 2020-06-14 ENCOUNTER — Ambulatory Visit: Payer: 59 | Admitting: Pulmonary Disease

## 2020-06-14 VITALS — BP 110/64 | HR 74 | Temp 97.2°F | Ht 68.0 in | Wt 140.0 lb

## 2020-06-14 DIAGNOSIS — R0609 Other forms of dyspnea: Secondary | ICD-10-CM

## 2020-06-14 DIAGNOSIS — M3501 Sicca syndrome with keratoconjunctivitis: Secondary | ICD-10-CM

## 2020-06-14 DIAGNOSIS — J842 Lymphoid interstitial pneumonia: Secondary | ICD-10-CM

## 2020-06-14 DIAGNOSIS — R0600 Dyspnea, unspecified: Secondary | ICD-10-CM | POA: Diagnosis not present

## 2020-06-14 LAB — PULMONARY FUNCTION TEST
DL/VA % pred: 85 %
DL/VA: 3.48 ml/min/mmHg/L
DLCO cor % pred: 77 %
DLCO cor: 17.81 ml/min/mmHg
DLCO unc % pred: 77 %
DLCO unc: 17.81 ml/min/mmHg
FEF 25-75 Pre: 2.34 L/sec
FEF2575-%Pred-Pre: 90 %
FEV1-%Pred-Pre: 93 %
FEV1-Pre: 2.77 L
FEV1FVC-%Pred-Pre: 100 %
FEV6-%Pred-Pre: 94 %
FEV6-Pre: 3.49 L
FEV6FVC-%Pred-Pre: 102 %
FVC-%Pred-Pre: 92 %
FVC-Pre: 3.54 L
Pre FEV1/FVC ratio: 78 %
Pre FEV6/FVC Ratio: 99 %

## 2020-06-14 NOTE — Patient Instructions (Addendum)
We will get some labs today for baseline evaluation Get high-res CT in 3 months Follow-up in clinic after CT

## 2020-06-14 NOTE — Progress Notes (Signed)
Spirometry and Dlco done today. 

## 2020-06-14 NOTE — Progress Notes (Signed)
Stefanie [name]    [id number]    07-31-1960  Primary Care Physician:Russo, Jonny Ruiz, MD  Referring Physician: Creola Corn, MD [address] [place],  [place] [address]  Problem list:  Follow-up for Sjogren's syndrome, on Imuran since 2021 ILD, lymphocytic interstitial pneumonia  HPI: 60 year old with history of asthma, palpitations, Sjogrens syndrome Sent in for evaluation of chronic dyspnea on exertion for the past few years associated with chest tightness, occasional wheezing She is on albuterol inhaler for asthma which she uses very seldom Also has seasonal allergies for which she uses over-the-counter antihistamines.  Follows with Dr. Corliss Skains, rheumatology with positive ANA, Ro, La, Cardiolipin and rheumatoid factor.    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  No mold, hot tub, Jacuzzi.  No down pillows or comforter Smoking history: Never smoker Travel history: No significant travel history Relevant family history: No significant family history of lung disease  Interim history: Here for follow-up of ILD related to Sjogren's syndrome She started on Imuran last year after discussion with Dr. Corliss Skains.  She is also on colchicine and Plaquenil.  States that breathing is improved.  Arthritis still continues to bother her.  Outpatient Encounter Medications as of 06/14/2020  Medication Sig  . albuterol (PROVENTIL HFA;VENTOLIN HFA) 108 (90 BASE) MCG/ACT inhaler Inhale 1 puff into the lungs every 6 (six) hours as needed for wheezing or shortness of breath.  . Ascorbic Acid (VITAMIN C) 1000 MG tablet Take 1,000 mg by mouth daily.  [name] azaTHIOprine (IMURAN) 50 MG tablet TAKE 2 TABLETS BY MOUTH EVERY DAY  . hydroxychloroquine (PLAQUENIL) 200 MG tablet TAKE 1 TABLET BY MOUTH IN THE MORNING AND 1/2 TABLET EVERY EVENING  . levothyroxine (SYNTHROID, LEVOTHROID) 125 MCG tablet Take 125 mcg by  mouth at bedtime.   [name] MITIGARE 0.6 MG CAPS TAKE 1 CAPSULE BY MOUTH EVERY DAY  . ondansetron (ZOFRAN ODT) 4 MG disintegrating tablet Take 1 tablet (4 mg total) by mouth every 8 (eight) hours as needed for nausea or vomiting.  . rizatriptan (MAXALT) 10 MG tablet Take 10 mg by mouth daily as needed for migraine. May repeat in 2 hours if needed  . Turmeric 450 MG CAPS Take by mouth daily.    No facility-administered encounter medications on file as of 06/14/2020.   Physical Exam: Blood pressure 110/64, pulse 74, temperature (!) 97.2 F (36.2 C), temperature source Temporal, height 5\' 8"  (1.727 m), weight 140 lb (63.5 kg), last menstrual period 04/09/2014, SpO2 100 %. Gen:      No acute distress HEENT:  EOMI, sclera anicteric Neck:     No masses; no thyromegaly Lungs:    Clear to auscultation bilaterally; normal respiratory effort CV:         Regular rate and rhythm; no murmurs Abd:      + bowel sounds; soft, non-tender; no palpable masses, no distension Ext:    No edema; adequate peripheral perfusion Skin:      Warm and dry; no rash Neuro: alert and oriented x 3 Psych: normal mood and affect  Data Reviewed: Imaging: CTA 08/16/2014-mild right middle lobe bronchovascular opacities with tree-in-bud, scattered lung cysts. High-res CT 08/01/2019-cystic and nodular interstitial lung disease consistent with lymphocytic interstitial pneumonia.  Alternate diagnosis. I have reviewed the images personally.  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

## 2020-06-27 ENCOUNTER — Telehealth: Payer: Self-pay

## 2020-06-27 NOTE — Telephone Encounter (Signed)
Patient called stating on Wednesday, 06/19/20 mid afternoon she was sitting at her desk and both biceps felt really tight and painful just like when you have blood pressure taken.  Patient states she felt like she was going to pass out.  Patient states she went to her boss' office and was just staring and her boss ws worried that it was her blood sugar and gave her a reeces cup.  Patient states she felt better later that day.  At 4:45 pm she was driving home on [address] and had the same feeling and pulled over.  Patient states she tried to contact her husband but was unsuccessful.  Patient states she drove home slowly.  Patient states on Thursday, 4/14 she felt tired and had a feeling that something was wrong with her.  Friday, 06/21/20 she took her mom to the hairdresser and the tightness and pain in her biceps, as well as the feeling of passing out returned.  Patient states she called Dr. Ferd Hibbs office and left a message with his nurse, but didn't receive a return call until Monday, 06/24/20.      Patient states this week she has worked all week, but still has fatigue.  Patient requested a return call.

## 2020-06-28 NOTE — Telephone Encounter (Signed)
I would recommend that she should be seen at her PCPs office or at the urgent care.  You may schedule her for the evaluation of knee joint swelling after she is evaluated by her PCP.

## 2020-06-28 NOTE — Telephone Encounter (Signed)
I called patient, patient verbalized understanding. 

## 2020-07-22 ENCOUNTER — Other Ambulatory Visit: Payer: 59

## 2020-08-14 ENCOUNTER — Other Ambulatory Visit: Payer: Self-pay | Admitting: Internal Medicine

## 2020-08-14 DIAGNOSIS — Z1231 Encounter for screening mammogram for malignant neoplasm of breast: Secondary | ICD-10-CM

## 2020-08-15 ENCOUNTER — Encounter: Payer: Self-pay | Admitting: Neurology

## 2020-09-13 ENCOUNTER — Other Ambulatory Visit: Payer: Self-pay | Admitting: Physician Assistant

## 2020-09-13 DIAGNOSIS — M359 Systemic involvement of connective tissue, unspecified: Secondary | ICD-10-CM

## 2020-09-13 NOTE — Telephone Encounter (Signed)
Last Visit: 05/31/2020  Next Visit: 10/25/2020  Labs: 05/16/2019, Ketones 80, Potassium 3.4,   Eye exam: 02/26/2020  Current Dose per office note 05/31/2020, Plaquenil 200 mg 1 tablet in the morning and 1/2 tablet in the evening DX: Sjogren's syndrome with keratoconjunctivitis sicca    Last Fill: 06/13/2020  Okay to refill Plaquenil?

## 2020-09-24 ENCOUNTER — Ambulatory Visit (INDEPENDENT_AMBULATORY_CARE_PROVIDER_SITE_OTHER)
Admission: RE | Admit: 2020-09-24 | Discharge: 2020-09-24 | Disposition: A | Discharge status: Home / Self Care | Payer: 59 | Source: Ambulatory Visit | Attending: Pulmonary Disease | Admitting: Pulmonary Disease

## 2020-09-24 ENCOUNTER — Inpatient Hospital Stay: Admission: RE | Admit: 2020-09-24 | Payer: 59 | Source: Ambulatory Visit

## 2020-09-24 ENCOUNTER — Other Ambulatory Visit: Payer: Self-pay

## 2020-09-24 DIAGNOSIS — J842 Lymphoid interstitial pneumonia: Secondary | ICD-10-CM

## 2020-09-24 DIAGNOSIS — R0600 Dyspnea, unspecified: Secondary | ICD-10-CM

## 2020-09-24 DIAGNOSIS — R0609 Other forms of dyspnea: Secondary | ICD-10-CM

## 2020-10-04 ENCOUNTER — Other Ambulatory Visit: Payer: Self-pay

## 2020-10-04 DIAGNOSIS — M3501 Sicca syndrome with keratoconjunctivitis: Secondary | ICD-10-CM

## 2020-10-04 DIAGNOSIS — Z79899 Other long term (current) drug therapy: Secondary | ICD-10-CM

## 2020-10-07 LAB — CBC WITH DIFFERENTIAL/PLATELET
Absolute Monocytes: 400 cells/uL (ref 200–950)
Basophils Absolute: 9 cells/uL (ref 0–200)
Basophils Relative: 0.3 %
Eosinophils Absolute: 90 cells/uL (ref 15–500)
Eosinophils Relative: 3.1 %
HCT: 38.2 % (ref 35.0–45.0)
Hemoglobin: 12.4 g/dL (ref 11.7–15.5)
Lymphs Abs: 821 cells/uL — ABNORMAL LOW (ref 850–3900)
MCH: 29.5 pg (ref 27.0–33.0)
MCHC: 32.5 g/dL (ref 32.0–36.0)
MCV: 90.7 fL (ref 80.0–100.0)
MPV: 10.9 fL (ref 7.5–12.5)
Monocytes Relative: 13.8 %
Neutro Abs: 1581 cells/uL (ref 1500–7800)
Neutrophils Relative %: 54.5 %
Platelets: 211 10*3/uL (ref 140–400)
RBC: 4.21 10*6/uL (ref 3.80–5.10)
RDW: 13 % (ref 11.0–15.0)
Total Lymphocyte: 28.3 %
WBC: 2.9 10*3/uL — ABNORMAL LOW (ref 3.8–10.8)

## 2020-10-07 LAB — C3 AND C4
C3 Complement: 128 mg/dL (ref 83–193)
C4 Complement: 28 mg/dL (ref 15–57)

## 2020-10-07 LAB — COMPLETE METABOLIC PANEL WITH GFR
AG Ratio: 1.4 (calc) (ref 1.0–2.5)
ALT: 17 U/L (ref 6–29)
AST: 27 U/L (ref 10–35)
Albumin: 4.1 g/dL (ref 3.6–5.1)
Alkaline phosphatase (APISO): 107 U/L (ref 37–153)
BUN: 13 mg/dL (ref 7–25)
CO2: 29 mmol/L (ref 20–32)
Calcium: 9.2 mg/dL (ref 8.6–10.4)
Chloride: 104 mmol/L (ref 98–110)
Creat: 0.65 mg/dL (ref 0.50–1.05)
Globulin: 3 g/dL (calc) (ref 1.9–3.7)
Glucose, Bld: 83 mg/dL (ref 65–99)
Potassium: 4.3 mmol/L (ref 3.5–5.3)
Sodium: 140 mmol/L (ref 135–146)
Total Bilirubin: 0.5 mg/dL (ref 0.2–1.2)
Total Protein: 7.1 g/dL (ref 6.1–8.1)
eGFR: 101 mL/min/{1.73_m2} (ref >=60)

## 2020-10-07 LAB — SEDIMENTATION RATE: Sed Rate: 19 mm/h (ref 0–30)

## 2020-10-07 LAB — URINALYSIS, ROUTINE W REFLEX MICROSCOPIC
Bilirubin Urine: NEGATIVE
Glucose, UA: NEGATIVE
Hgb urine dipstick: NEGATIVE
Ketones, ur: NEGATIVE
Leukocytes,Ua: NEGATIVE
Nitrite: NEGATIVE
Protein, ur: NEGATIVE
Specific Gravity, Urine: 1.01 (ref 1.001–1.035)
pH: 6 (ref 5.0–8.0)

## 2020-10-07 LAB — ANTI-NUCLEAR AB-TITER (ANA TITER): ANA Titer 1: 1:320 {titer} — ABNORMAL HIGH

## 2020-10-07 LAB — ANTI-DNA ANTIBODY, DOUBLE-STRANDED: ds DNA Ab: 1 IU/mL

## 2020-10-07 LAB — ANA: Anti Nuclear Antibody (ANA): POSITIVE — AB

## 2020-10-07 LAB — SJOGRENS SYNDROME-A EXTRACTABLE NUCLEAR ANTIBODY: SSA (Ro) (ENA) Antibody, IgG: >8 AI — AB

## 2020-10-08 ENCOUNTER — Other Ambulatory Visit: Payer: Self-pay | Admitting: *Deleted

## 2020-10-08 ENCOUNTER — Telehealth: Payer: Self-pay | Admitting: Rheumatology

## 2020-10-08 DIAGNOSIS — Z79899 Other long term (current) drug therapy: Secondary | ICD-10-CM

## 2020-10-08 MED ORDER — AZATHIOPRINE 50 MG PO TABS: 75.0000 mg | ORAL_TABLET | Freq: Every day | ORAL | 0 refills | Status: DC

## 2020-10-08 NOTE — Telephone Encounter (Signed)
Patient requesting Dr. Corliss Skains look at a recent CT scan ( she had two weeks ago) Patient feels as if doctor may want to see the results before she decreases her medication.

## 2020-10-08 NOTE — Telephone Encounter (Signed)
I reviewed the CT scan of the chest.  According to the radiology report the CT scan is stable.  It is okay for patient to reduce Imuran dose due to drop in WBC count.

## 2020-10-08 NOTE — Progress Notes (Signed)
Discussed lab work with Dr. Deveshwar.  ANA and Ro antibody remain positive.  dsDNA negative.  Complements and ESR WNL. UA normal.  CMP WNL.  WBC count is low-2.9.  absolute lymphocyte count is low. Please advise the patient to reduce imuran to 75 mg daily and continue on current dose of PLQ.  Please change prescription accordingly so it is accurate for future refills.   Recheck CBC with diff in 1 month.

## 2020-10-08 NOTE — Telephone Encounter (Signed)
-----  Message from Ofilia Neas, PA-C sent at 10/08/2020  1:09 PM EDT ----- Discussed lab work with Dr. Estanislado Pandy.  ANA and Ro antibody remain positive.  dsDNA negative.  Complements and ESR WNL. UA normal.  CMP WNL.  WBC count is low-2.9.  absolute lymphocyte count is low. Please advise the patient to reduce imuran to 75 mg  daily and continue on current dose of PLQ.  Please change prescription accordingly so it is accurate for future refills.   Recheck CBC with diff in 1 month.

## 2020-10-08 NOTE — Telephone Encounter (Signed)
Patient advised Dr.Deveshwar reviewed the CT scan of the chest.  According to the radiology report the CT scan is stable.  Patient advised it is okay for patient to reduce Imuran dose due to drop in WBC count. Patient expressed understanding.

## 2020-10-11 NOTE — Progress Notes (Signed)
Office Visit Note  Patient: Stefanie [name]             Date of Birth: 07-31-1960           MRN: [id number]             PCP: Creola Corn, MD Referring: Creola Corn, MD Visit Date: 10/25/2020 Occupation: @GUAROCC @  Subjective:  Fatigue, dry mouth and dry eyes   History of Present Illness: Stefanie [name] is a 60 y.o. female with history of Sjogren's, ILD, osteoarthritis and fibromyalgia syndrome.  She states she had recent CT scanning of her chest by Dr. 66.  She continues to have some shortness of breath on exertion.  She gives history of dry mouth and dry eyes.  She has been taking hydroxychloroquine and Imuran on a regular basis.  She has not noticed any joint swelling recently.  She continues to have some discomfort in her hands and her knees.  She has a stiffness in her neck and also generalized pain from fibromyalgia.  She states she has been under a lot of stress as she is the caregiver for her parents.  Activities of Daily Living:  Patient reports morning stiffness for 20-25 minutes.   Patient Denies nocturnal pain.  Difficulty dressing/grooming: Denies Difficulty climbing stairs: Reports Difficulty getting out of chair: Reports Difficulty using hands for taps, buttons, cutlery, and/or writing: Reports  Review of Systems  Constitutional:  Positive for fatigue.  HENT:  Positive for mouth sores, mouth dryness and nose dryness.   Eyes:  Positive for dryness. Negative for pain and itching.  Respiratory:  Negative for shortness of breath and difficulty breathing.   Cardiovascular:  Negative for chest pain and palpitations.  Gastrointestinal:  Negative for blood in stool, constipation and diarrhea.  Endocrine: Negative for increased urination.  Genitourinary:  Negative for difficulty urinating.  Musculoskeletal:  Positive for joint pain, joint pain, joint swelling and morning stiffness. Negative for myalgias, muscle tenderness and myalgias.  Skin:  Positive for  redness. Negative for color change and rash.  Allergic/Immunologic: Negative for susceptible to infections.  Neurological:  Positive for numbness, headaches and weakness. Negative for dizziness and memory loss.  Hematological:  Positive for bruising/bleeding tendency.  Psychiatric/Behavioral:  Negative for confusion.    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   Prolonged QT interval 07/28/2016   Primary osteoarthritis of both hands 05/11/2016   Primary osteoarthritis of both knees 05/11/2016   History of hypothyroidism 05/11/2016   History of migraine 05/11/2016   Autoimmune disease (HCC) positive ANA, 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, 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, anticardiolipin antibody and positive rheumatoid factor 04/29/2016   ANA positive 04/29/2016   Rheumatoid factor positive 04/29/2016   Pseudogout 04/29/2016   Chondrocalcinosis 04/29/2016   High risk medication use 04/29/2016   Sjogren's syndrome with keratoconjunctivitis sicca (HCC) 04/29/2016   History of neutropenia 04/29/2016   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   Hyponatremia 12/12/2013   Fever 12/13/2012   Pleuritic chest pain 12/06/2012   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   Anxiety 12/06/2012   ANEMIA-IRON DEFICIENCY 01/08/2010   GASTRIC DILATION, ACUTE 01/08/2010   NAUSEA ALONE 01/08/2010  ABDOMINAL PAIN, LEFT UPPER QUADRANT 01/08/2010    Past Medical History:  Diagnosis Date   Anemia    Arthritis    Asthma    Diabetes insipidus (HCC)    Fibromyalgia    Lupus (HCC)    Mitral valve prolapse    SLE (systemic lupus erythematosus) (HCC) 12/06/2012    Thyroid disease     Family History  Problem Relation Age of Onset   Atrial fibrillation Mother    Dementia Mother    Diabetes Father    Heart disease Father    Hyperlipidemia Father    Hypertension Father    Stroke Father    Diabetes Sister    Kidney disease Sister        transplant   Heart disease Sister        open heart surgery    Pancreatic disease Sister        transplant    Cancer Maternal Grandmother    Diabetes Paternal Grandfather    Heart disease Paternal Grandfather    Hyperlipidemia Paternal Grandfather    Hypertension Paternal Actor    Healthy Daughter    Healthy Son    Past Surgical History:  Procedure Laterality Date   PITUITARY EXCISION     TONSILLECTOMY     Social History   Social History Narrative   Not on file   Immunization History  Administered Date(s) Administered   Influenza Inj Mdck Quad Pf 01/21/2020   Influenza-Unspecified 09/28/2018   PFIZER(Purple Top)SARS-COV-2 Vaccination 11/17/2019, 12/13/2019     Objective: Vital Signs: BP 107/72 (BP Location: Left Arm, Patient Position: Sitting, Cuff Size: Normal)   Pulse 66   Ht 5\' 6"  (1.676 m)   Wt 138 lb (62.6 kg)   LMP 04/09/2014 (Approximate)   BMI 22.27 kg/m    Physical Exam Vitals and nursing note reviewed.  Constitutional:      Appearance: She is well-developed.  HENT:     Head: Normocephalic and atraumatic.  Eyes:     Conjunctiva/sclera: Conjunctivae normal.  Cardiovascular:     Rate and Rhythm: Normal rate and regular rhythm.     Heart sounds: Normal heart sounds.  Pulmonary:     Effort: Pulmonary effort is normal.     Breath sounds: Normal breath sounds.  Abdominal:     General: Bowel sounds are normal.     Palpations: Abdomen is soft.  Musculoskeletal:     Cervical back: Normal range of motion.  Lymphadenopathy:     Cervical: No cervical adenopathy.  Skin:    General: Skin is warm and dry.     Capillary Refill: Capillary refill takes less than 2 seconds.   Neurological:     Mental Status: She is alert and oriented to person, place, and time.  Psychiatric:        Behavior: Behavior normal.     Musculoskeletal Exam: C-spine was in good range of motion.  Shoulder joints, elbow joints, wrist joints, MCPs PIPs and DIPs with good range of motion with no synovitis.  Hip joints, knee joints, ankles, MTPs and PIPs with good range of motion with no synovitis.  CDAI Exam: CDAI Score: -- Patient Global: --; Provider Global: -- Swollen: --; Tender: -- Joint Exam 10/25/2020   No joint exam has been documented for this visit   There is currently no information documented on the homunculus. Go to the Rheumatology activity and complete the homunculus joint exam.  Investigation: No additional findings.  Imaging: No results found.  Recent Labs:  Lab Results  Component Value Date   WBC 2.9 (L) 10/04/2020   HGB 12.4 10/04/2020   PLT 211 10/04/2020   NA 140 10/04/2020   K 4.3 10/04/2020   CL 104 10/04/2020   CO2 29 10/04/2020   GLUCOSE 83 10/04/2020   BUN 13 10/04/2020   CREATININE 0.65 10/04/2020   BILITOT 0.5 10/04/2020   ALKPHOS 82 05/15/2020   AST 27 10/04/2020   ALT 17 10/04/2020   PROT 7.1 10/04/2020   ALBUMIN 4.1 05/15/2020   CALCIUM 9.2 10/04/2020   GFRAA 102 04/24/2020   QFTBGOLDPLUS NEGATIVE 10/16/2019    Speciality Comments: PLQ Eye Exam 02/26/2020 WNL @ [facility] follow up in 1 year  Procedures:  No procedures performed Allergies: Iron sucrose, Morphine, Venofer [ferric oxide], Azithromycin, and Other   Assessment / Plan:     Visit Diagnoses: Sjogren's syndrome with keratoconjunctivitis sicca (HCC) - Positive ANA, positive Ro, positive La, positive RF sicca symptoms, inflammatory arthritis: She had no synovitis on my examination.  She continues to have dry mouth and dry eyes.  High risk medication use - Imuran 100 mg po qd, Plaquenil 200 mg 1 tablet in the morning and 1/2 tablet in the evening. PLQ Eye  Exam 02/26/2020 WNL @ [facility].  Her labs from October 04, 2020 showed neutropenia.  All autoimmune labs were stable.  Other drug-induced neutropenia (HCC) -most likely due to medication use.  She is on hydroxychloroquine 300 mg p.o. daily.  She is also on Imuran 100 mg p.o. daily.  I advised her to discontinue hydroxychloroquine because there is also history of prolonged QT interval.  We will check CBC in 2 weeks.  Plan: CBC with Differential/Platelet.  If the CBC improves then we will monitor labs every 3 months to monitor for drug toxicity.  I will keep her on Imuran monotherapy.  Lymphoid interstitial pneumonia [facility]) - She is followed by Dr. Mannam.High-resolution chest CT ordered on 08/01/2019: Patient has high-resolution CT on September 24, 2020 which was a stable.  Patient will discuss this further with Dr. Isaiah Serge.  Primary osteoarthritis of both hands-joint protection muscle strengthening was discussed.  Primary osteoarthritis of both knees-she had no warmth swelling or effusion in her knee joints today.  Chondromalacia patellae, right knee  Chondrocalcinosis - continues to take colchicine 0.6 mg 1 capsule by mouth daily.    Cervical spondylosis without myelopathy-she has chronic discomfort.  Fibromyalgia-she has been experiencing increased pain and discomfort and flare of fibromyalgia due to increased stress.  Other fatigue-he has chronic fatigue.  History of diabetes insipidus  History of anxiety  History of migraine  Prolonged QT interval-she is followed by cardiologist.  History of hypothyroidism  Orders: Orders Placed This Encounter  Procedures   CBC with Differential/Platelet    No orders of the defined types were placed in this encounter.    Follow-Up Instructions: Return in about 3 months (around 01/25/2021) for Sjogren's.   Pollyann Savoy, MD  Note - This record has been created using Animal nutritionist.  Chart creation errors have been sought, but  may not always  have been located. Such creation errors do not reflect on  the standard of medical care.

## 2020-10-25 ENCOUNTER — Encounter: Payer: Self-pay | Admitting: Rheumatology

## 2020-10-25 ENCOUNTER — Ambulatory Visit: Payer: 59 | Admitting: Rheumatology

## 2020-10-25 ENCOUNTER — Other Ambulatory Visit: Payer: Self-pay

## 2020-10-25 VITALS — BP 107/72 | HR 66 | Ht 66.0 in | Wt 138.0 lb

## 2020-10-25 DIAGNOSIS — M797 Fibromyalgia: Secondary | ICD-10-CM

## 2020-10-25 DIAGNOSIS — Z8659 Personal history of other mental and behavioral disorders: Secondary | ICD-10-CM

## 2020-10-25 DIAGNOSIS — M170 Bilateral primary osteoarthritis of knee: Secondary | ICD-10-CM

## 2020-10-25 DIAGNOSIS — R5383 Other fatigue: Secondary | ICD-10-CM

## 2020-10-25 DIAGNOSIS — Z8639 Personal history of other endocrine, nutritional and metabolic disease: Secondary | ICD-10-CM

## 2020-10-25 DIAGNOSIS — R9431 Abnormal electrocardiogram [ECG] [EKG]: Secondary | ICD-10-CM

## 2020-10-25 DIAGNOSIS — M3501 Sicca syndrome with keratoconjunctivitis: Secondary | ICD-10-CM | POA: Diagnosis not present

## 2020-10-25 DIAGNOSIS — M19041 Primary osteoarthritis, right hand: Secondary | ICD-10-CM

## 2020-10-25 DIAGNOSIS — J842 Lymphoid interstitial pneumonia: Secondary | ICD-10-CM | POA: Diagnosis not present

## 2020-10-25 DIAGNOSIS — D702 Other drug-induced agranulocytosis: Secondary | ICD-10-CM | POA: Diagnosis not present

## 2020-10-25 DIAGNOSIS — M47812 Spondylosis without myelopathy or radiculopathy, cervical region: Secondary | ICD-10-CM

## 2020-10-25 DIAGNOSIS — M2241 Chondromalacia patellae, right knee: Secondary | ICD-10-CM

## 2020-10-25 DIAGNOSIS — Z79899 Other long term (current) drug therapy: Secondary | ICD-10-CM | POA: Diagnosis not present

## 2020-10-25 DIAGNOSIS — M1120 Other chondrocalcinosis, unspecified site: Secondary | ICD-10-CM

## 2020-10-25 DIAGNOSIS — Z8669 Personal history of other diseases of the nervous system and sense organs: Secondary | ICD-10-CM

## 2020-10-25 DIAGNOSIS — M19042 Primary osteoarthritis, left hand: Secondary | ICD-10-CM

## 2020-10-25 NOTE — Patient Instructions (Addendum)
Standing Labs We placed an order today for your standing lab work.   Please have your standing labs drawn in 2 weeks( CBC only) and then standing labs every 3 months.  If possible, please have your labs drawn 2 weeks prior to your appointment so that the provider can discuss your results at your appointment.  Please note that you may see your imaging and lab results in MyChart before we have reviewed them. We may be awaiting multiple results to interpret others before contacting you. Please allow our office up to 72 hours to thoroughly review all of the results before contacting the office for clarification of your results.  We have open lab daily: Monday through Thursday from 1:30-4:30 PM and Friday from 1:30-4:00 PM at the office of Dr. Pollyann Savoy, [facility].   Please be advised, all patients with office appointments requiring lab work will take precedent over walk-in lab work.  If possible, please come for your lab work on Monday and Friday afternoons, as you may experience shorter wait times. The office is located at [address], [place], [place] [address] No appointment is necessary.   Labs are drawn by [facility]. Please bring your co-pay at the time of your lab draw.  You may receive a bill from [facility] for your lab work.  If you wish to have your labs drawn at another location, please call the office 24 hours in advance to send orders.  If you have any questions regarding directions or hours of operation,  please call [phone].   As a reminder, please drink plenty of water prior to coming for your lab work. Thanks!   Vaccines You are taking a medication(s) that can suppress your immune system.  The following immunizations are recommended: Flu annually Covid-19  Td/Tdap (tetanus, diphtheria, pertussis) every 10 years Pneumonia (Prevnar 15 then Pneumovax 23 at least 1 year apart.  Alternatively, can take Prevnar 20 without needing additional  dose) Shingrix (after age 61): 2 doses from 4 weeks to 6 months apart  Please check with your PCP to make sure you are up to date.   If you test POSITIVE for COVID19 and have MILD to MODERATE symptoms: First, call your PCP if you would like to receive COVID19 treatment AND Hold your medications during the infection and for at least 1 week after your symptoms have resolved: Injectable medication (Benlysta, Cimzia, Cosentyx, Enbrel, Humira, Orencia, Remicade, Simponi, Stelara, Taltz, Tremfya) Methotrexate Leflunomide (Arava) Azathioprine Mycophenolate (Cellcept) Osborne [place], or Rinvoq Otezla If you take Actemra or Kevzara, you DO NOT need to hold these for COVID19 infection.  If you test POSITIVE for COVID19 and have NO symptoms: First, call your PCP if you would like to receive COVID19 treatment AND Hold your medications for at least 10 days after the day that you tested positive Injectable medication (Benlysta, Cimzia, Cosentyx, Enbrel, Humira, Orencia, Remicade, Simponi, Stelara, Taltz, Tremfya) Methotrexate Leflunomide (Arava) Azathioprine Mycophenolate (Cellcept) Osborne [place], or Rinvoq Otezla If you take Actemra or Kevzara, you DO NOT need to hold these for COVID19 infection.  If you have signs or symptoms of an infection or start antibiotics: First, call your PCP for workup of your infection. Hold your medication through the infection, until you complete your antibiotics, and until symptoms resolve if you take the following: Injectable medication (Actemra, Benlysta, Cimzia, Cosentyx, Enbrel, Humira, Kevzara, Orencia, Remicade, Simponi, Stelara, Taltz, Tremfya) Methotrexate Leflunomide (Arava) Mycophenolate (Cellcept) Osborne [place], or Rinvoq  Heart Disease Prevention   Your  inflammatory disease increases your risk of heart disease which includes heart attack, stroke, atrial fibrillation (irregular heartbeats), high blood pressure, heart failure and  atherosclerosis (plaque in the arteries).  It is important to reduce your risk by:   Keep blood pressure, cholesterol, and blood sugar at healthy levels   Smoking Cessation   Maintain a healthy weight  BMI 20-25   Eat a healthy diet  Plenty of fresh fruit, vegetables, and whole grains  Limit saturated fats, foods high in sodium, and added sugars  DASH and Mediterranean diet   Increase physical activity  Recommend moderate physically activity for 150 minutes per week/ 30 minutes a day for five days a week These can be broken up into three separate ten-minute sessions during the day.   Reduce Stress  Meditation, slow breathing exercises, yoga, coloring books  Dental visits twice a year

## 2020-11-01 ENCOUNTER — Ambulatory Visit: Payer: 59 | Admitting: Cardiology

## 2020-11-01 ENCOUNTER — Encounter: Payer: Self-pay | Admitting: Cardiology

## 2020-11-01 ENCOUNTER — Ambulatory Visit
Admission: RE | Admit: 2020-11-01 | Discharge: 2020-11-01 | Disposition: A | Discharge status: Home / Self Care | Payer: 59 | Source: Ambulatory Visit | Attending: Internal Medicine | Admitting: Internal Medicine

## 2020-11-01 ENCOUNTER — Other Ambulatory Visit: Payer: Self-pay

## 2020-11-01 VITALS — BP 108/75 | HR 74 | Ht 66.0 in | Wt 137.0 lb

## 2020-11-01 DIAGNOSIS — R0609 Other forms of dyspnea: Secondary | ICD-10-CM

## 2020-11-01 DIAGNOSIS — I340 Nonrheumatic mitral (valve) insufficiency: Secondary | ICD-10-CM

## 2020-11-01 DIAGNOSIS — I361 Nonrheumatic tricuspid (valve) insufficiency: Secondary | ICD-10-CM

## 2020-11-01 DIAGNOSIS — Z1231 Encounter for screening mammogram for malignant neoplasm of breast: Secondary | ICD-10-CM

## 2020-11-01 NOTE — Progress Notes (Signed)
Stefanie [name] Date of Birth: 1960-08-08 MRN: [id number] Primary Care Provider:Russo, Stefanie RuizJohn, MD Former Cardiology Providers: Stefanie CarolinaAshton Kelley, APRN, FNP-C Primary Cardiologist: Stefanie LernerSunit Dari Carpenito, DO, [facility] (established care 11/15/2019)  Date: 11/01/20 Last Office Visit: 04/30/2020   Chief Complaint  Patient presents with   Dyspnea on exertion   Follow-up    6 month    HPI  Stefanie [name] is a 60 y.o.  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  Patient follows up with the clinic for management of palpitations and effort related dyspnea.    Patient presents today for evaluation of dyspnea as a 378-month follow-up.  Since last office visit patient states that her symptoms have remained relatively stable.  She has undergone an echocardiogram in the recent past which noted preserved LVEF, stable moderate mitral regurgitation, and RVSP within normal limits not suggestive of pulmonary hypertension.  Her NT proBNP levels in the past have also remained relatively stable.  Patient has rheumatoid arthritis with positive ANA and rheumatoid factor, keratoconjunctivitis silica, interstitial pneumonitis which is currently being followed by rheumatology and pulmonary medicine.  Denies any hospitalizations or urgent care visits for cardiovascular symptoms.  ALLERGIES: Allergies  Allergen Reactions   Iron Sucrose Rash    Chest pain   Morphine     Other reaction(s): Vomiting   Venofer [Ferric Oxide] Palpitations    Dizziness, sweating, "felt like I was going to pass out" unknown when episode happened, itching   Azithromycin Other (See Comments)    "couldnt sleep"   Other      Other reaction(s): Other (See Comments) Magic mouth wash. Caused tongue to have a burning feeling     MEDICATION LIST PRIOR TO VISIT: Current Outpatient Medications on File Prior to Visit  Medication Sig Dispense Refill   azaTHIOprine (IMURAN) 50 MG tablet Take 1.5 tablets (75 mg total) by mouth daily. 135 tablet 0   levothyroxine (SYNTHROID, LEVOTHROID) 125 MCG tablet Take 125 mcg by mouth at bedtime.      MITIGARE 0.6 MG CAPS TAKE 1 CAPSULE BY MOUTH EVERY DAY 90 capsule 0   rizatriptan (MAXALT) 10 MG tablet Take 10 mg by mouth daily as needed for migraine. May repeat in 2 hours if needed     Turmeric 450 MG CAPS Take by mouth daily.      albuterol (PROVENTIL HFA;VENTOLIN HFA) 108 (90 BASE) MCG/ACT inhaler Inhale 1 puff into the lungs every 6 (six) hours as needed for wheezing or shortness of breath.     No current facility-administered medications on file prior to visit.    PAST MEDICAL HISTORY: Past Medical History:  Diagnosis Date   Anemia    Arthritis    Asthma    Diabetes insipidus (HCC)    Fibromyalgia    Lupus (HCC)    Mitral valve prolapse    SLE (systemic lupus erythematosus) (HCC) 12/06/2012   Thyroid disease     PAST SURGICAL HISTORY: Past Surgical History:  Procedure Laterality Date   PITUITARY EXCISION     TONSILLECTOMY      FAMILY HISTORY: [name]; Cancer in her maternal grandmother; Dementia in her mother; Diabetes in her father, paternal grandfather, and sister; Healthy in her daughter and son;  Heart disease in her father, paternal grandfather, and sister; Hyperlipidemia in her father and paternal grandfather; Hypertension in her father and paternal grandfather; Kidney disease in her sister; Pancreatic disease in her sister; Stroke in her father.   SOCIAL HISTORY:  The patient  reports that she has never smoked. She has never used smokeless tobacco. She reports that she does not drink alcohol  and does not use drugs.  Review of Systems  Constitutional: Negative for chills and fever.  HENT:  Negative for hoarse voice and nosebleeds.   Eyes:  Negative for discharge, double vision and pain.  Cardiovascular:  Negative for chest pain, claudication, leg swelling, near-syncope, orthopnea, palpitations, paroxysmal nocturnal dyspnea and syncope.  Respiratory:  Positive for shortness of breath (chronic and stable. ). Negative for hemoptysis.   Musculoskeletal:  Positive for arthritis, back pain, joint pain and stiffness. Negative for muscle cramps and myalgias.  Gastrointestinal:  Negative for abdominal pain, constipation, diarrhea, hematemesis, hematochezia, melena, nausea and vomiting.  Neurological:  Negative for dizziness and light-headedness.   PHYSICAL EXAM: Vitals with BMI 11/01/2020 10/25/2020 06/14/2020  Height 5\' 6"  5\' 6"  5\' 8"   Weight 137 lbs 138 lbs 140 lbs  BMI 22.12 22.28 21.29  Systolic 108 107  Diastolic 75 72 64  Pulse 74 66 74    CONSTITUTIONAL: Well-developed and well-nourished. No acute distress.  SKIN: Skin is warm and dry. No rash noted. No cyanosis. No pallor. No jaundice HEAD: Normocephalic and atraumatic.  EYES: No scleral icterus MOUTH/THROAT: Moist oral membranes.  NECK: No JVD present. No thyromegaly noted. No carotid bruits  LYMPHATIC: No visible cervical adenopathy.  CHEST Normal respiratory effort. No intercostal retractions  LUNGS: Clear to auscultation bilaterally. No stridor. No wheezes. No rales.  CARDIOVASCULAR: Regular rate and rhythm, positive S1-S2, Holosystolic murmur heard at the apex, no rubs or gallops appreciated. ABDOMINAL: No apparent ascites.  EXTREMITIES: No peripheral edema  HEMATOLOGIC: No significant bruising NEUROLOGIC: Oriented to person, place, and time. Nonfocal. Normal muscle tone.  PSYCHIATRIC: Normal mood and affect. Normal behavior. Cooperative  CARDIAC DATABASE: EKG: 11/01/2020: Normal sinus rhythm, 71 bpm, normal  axis, consider old anteroseptal infarct, without underlying ischemia or injury pattern.    Echocardiogram: 04/07/2019: Left ventricle cavity is normal in size and wall thickness. Normal global wall motion. Normal LV systolic function with EF 61%. Normal diastolic filling pattern. Mild prolapse of the mitral valve leaflets. Moderate (Grade III) mitral regurgitation. Moderate to severe tricuspid regurgitation. Estimated pulmonary artery systolic pressure is 27 mmHg. Compared to prior echo 05/08/2016: LVEF 55-60%, normal diastolic function, mild to moderate MR, mild TR  03/04/2020: 1. Left ventricle cavity is normal in size and wall thickness. Normal global wall motion. Normal LV systolic function with EF 56%. Normal diastolic filling pattern. 2. Trileaflet aortic valve with mild aortic valve leaflet calcification. No regurgitation. 3. Chordal calcification. Moderate mitral regurgitation. 4. Mild tricuspid regurgitation. 5. No evidence of pulmonary hypertension.  Stress Testing:  Lexiscan 05/08/2016: Nuclear stress EF: 64%. There was no ST segment deviation noted during stress. Defect 1: There is a medium defect of moderate severity present in the mid anterior and apical anterior location. The study is normal. This is a low risk study. The left ventricular ejection fraction is normal (55-65%) Low risk stress nuclear study with breast attenuation; no ischemia; EF 64 with normal wall motion.  Heart Catheterization: None  30-day event monitor 6/17-7/16/2020: Normal sinus rhythm. One auto detected event occurred on day 30 at 1647 for sinus tachycardia at  149 bpm. No patient triggered events occurred. Fastest heart rate was 149 bpm. No A. fib or SVT was noted.  LABORATORY DATA: CBC Latest Ref Rng & Units 10/04/2020 05/15/2020 02/16/2020  WBC 3.8 - 10.8 Thousand/uL 2.9(L) 7.4 4.6  Hemoglobin 11.7 - 15.5 g/dL 70.6 23.7 62.8  Hematocrit 35.0 - 45.0 % 38.2 40.2 38.5  Platelets 140 - 400  Thousand/uL 211 219 221    CMP Latest Ref Rng & Units 10/04/2020 05/15/2020 04/24/2020  Glucose 65 - 99 mg/dL 83 98 78  BUN 7 - 25 mg/dL 13 20 13   Creatinine 0.50 - 1.05 mg/dL 3.15 1.76  Sodium 135 - 146 mmol/L 140 136 138  Potassium 3.5 - 5.3 mmol/L 4.3 3.4(L) 4.1  Chloride 98 - 110 mmol/L 104 100 99  CO2 20 - 32 mmol/L 29 22 22   Calcium 8.6 - 10.4 mg/dL 9.2 9.1 9.5  Total Protein 6.1 - 8.1 g/dL 7.1 7.6 -  Total Bilirubin 0.2 - 1.2 mg/dL 0.5 1.1 -  Alkaline Phos 38 - 126 U/L - 82 -  AST 10 - 35 U/L 27 25 -  ALT 6 - 29 U/L 17 18 -   BNP (last 3 results) No results for input(s): BNP in the last 8760 hours.  ProBNP (last 3 results) Recent Labs    04/24/20 0946  PROBNP 274    Cardiac Panel (last 3 results) No results for input(s): CKTOTAL, CKMB, TROPONINIHS, RELINDX in the last 72 hours.  IMPRESSION:    ICD-10-CM   1. Dyspnea on exertion  R06.00 EKG 12-Lead    2. Moderate mitral regurgitation  I34.0 PCV ECHOCARDIOGRAM COMPLETE    3. Nonrheumatic tricuspid valve regurgitation  I36.1        RECOMMENDATIONS: Stefanie [name] is a 60 y.o. 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  Dyspnea on exertion Remains relatively stable. Currently follows up with rheumatology and pulmonary medicine given her rheumatoid arthritis, keratoconjunctivitis silica, interstitial pneumonitis. Moderate mitral regurgitation remains relatively stable over the last 2 studies. No findings of pulmonary hypertension on the most recent echocardiogram. And NT proBNP within normal limits. EKG nonischemic.  Moderate mitral regurgitation Stable and asymptomatic. Will repeat echocardiogram in June 2023 prior to the next office visit for reevaluation.  Nonrheumatic tricuspid valve  regurgitation Asymptomatic. Will repeat echocardiogram in June 2023 prior to the next office visit.  Of note, her last ischemic evaluation was in 2018.  Overall she remains stable and has not been experiencing chest pain and shortness of breath has remained relatively stable.  Normal cardiovascular standpoint the shared decision at today's visit was to hold off on additional testing at this time.  She is currently busy working and taking care of elderly parents.  No significant changes are made during today's encounter with regards to ordering diagnostic testing or change in medical therapy.  Total time spent: 21 minutes.   FINAL MEDICATION LIST END OF ENCOUNTER: No orders of the defined types were placed in this encounter.   Medications Discontinued During This Encounter  Medication Reason   Ascorbic Acid (VITAMIN C) 1000 MG tablet Error     Current Outpatient Medications:    azaTHIOprine (IMURAN) 50 MG tablet, Take 1.5 tablets (75 mg total) by mouth daily., Disp: 135 tablet, Rfl: 0   levothyroxine (SYNTHROID, LEVOTHROID) 125 MCG tablet, Take 125 mcg by mouth at bedtime. , Disp: ,  Rfl:    MITIGARE 0.6 MG CAPS, TAKE 1 CAPSULE BY MOUTH EVERY DAY, Disp: 90 capsule, Rfl: 0   rizatriptan (MAXALT) 10 MG tablet, Take 10 mg by mouth daily as needed for migraine. May repeat in 2 hours if needed, Disp: , Rfl:    Turmeric 450 MG CAPS, Take by mouth daily. , Disp: , Rfl:    albuterol (PROVENTIL HFA;VENTOLIN HFA) 108 (90 BASE) MCG/ACT inhaler, Inhale 1 puff into the lungs every 6 (six) hours as needed for wheezing or shortness of breath., Disp: , Rfl:   Orders Placed This Encounter  Procedures   EKG 12-Lead   PCV ECHOCARDIOGRAM COMPLETE   --Continue cardiac medications as reconciled in final medication list. --Return in about 10 months (around 09/08/2021) for Follow up, Dyspnea. Or sooner if needed. --Continue follow-up with your primary care physician regarding the management of your other chronic  comorbid conditions.  Patient's questions and concerns were addressed to her satisfaction. She voices understanding of the instructions provided during this encounter.   This note was created using a voice recognition software as a result there may be grammatical errors inadvertently enclosed that do not reflect the nature of this encounter. Every attempt is made to correct such errors.  Stefanie Lerner, [place], [facility]  Pager: [phone] Office: [phone]

## 2020-11-08 ENCOUNTER — Other Ambulatory Visit: Payer: Self-pay

## 2020-11-08 ENCOUNTER — Ambulatory Visit: Payer: 59 | Admitting: Neurology

## 2020-11-08 ENCOUNTER — Encounter: Payer: Self-pay | Admitting: Neurology

## 2020-11-08 VITALS — BP 99/62 | HR 79 | Ht 66.0 in | Wt 136.4 lb

## 2020-11-08 DIAGNOSIS — R404 Transient alteration of awareness: Secondary | ICD-10-CM | POA: Diagnosis not present

## 2020-11-08 DIAGNOSIS — R4789 Other speech disturbances: Secondary | ICD-10-CM | POA: Diagnosis not present

## 2020-11-08 DIAGNOSIS — G43009 Migraine without aura, not intractable, without status migrainosus: Secondary | ICD-10-CM

## 2020-11-08 NOTE — Patient Instructions (Signed)
It was very nice to meet you  We will check MRI of brain with and without contrast We will check a routine EEG Further recommendations pending results. If work up unremarkable, follow up as needed.

## 2020-11-08 NOTE — Progress Notes (Addendum)
She   NEUROLOGY CONSULTATION NOTE  Stefanie [name] MRN: [id number] DOB: 12-03-1960  Referring provider: Creola Corn, MD Primary care provider: Creola Corn, MD  Reason for consult:  spells  Assessment/Plan:   Spells of altered sensorium.  Will evaluate for temporal lobe seizures.  Migraine and TIA not suspected.  I would also consider cardiac etiology.  Her cardiologist is aware of these spells. Migraine without aura, without status migrainosus, not intractable - stable  MRI of brain with and without contrast Routine EEG Further recommendations pending results.  If testing unremarkable, follow up as needed.  ADDENDUM:  MRI of brain with and without contrast on 11/24/2020 personally reviewed was unremarkable.  Awake and asleep routine EEG on 11/25/2020 personally reviewed was normal.  No further recommendations.  Follow up as needed. Shon Millet, DO  Subjective:  Stefanie [name] is a 60 year old right-handed female with RA, SLE, Sjogren's syndrome, SIADH, thyroid disease, mitral valve regurgitation, and fibromyalgia who presents for spells.     .  No history of seizures.    Many years ago, debilitating migraines - hospitalized - 15-20 years okay - non last year - photo, phono, nausea, back of head, vice- used to take topiramate in 2 years   In April, she was sitting at her desk at work when she suddenly felt like she was going to pass out.  She turned around in her chair and began starting off.  People asked her if she was okay and she was unable to speak, only to shake her head.  Her arms felt heavy.  Her head felt "weird" but no headache.  Denied palpitations, loss of awareness, numbness, visual disturbance, diaphoresis, epigastric risking, phantosmia or altered taste.  In case it was due to low blood sugar, she had a piece of chocolate but symptoms did not improve.  It lasted about 9 minutes.  It happened two other times while driving home later that day.  She needed to pull over and  wait for episode to pass.  It happened one other time afterwards in late May, only lasting a minute.  No recurrent spell since then.  Denies history of seizures.  No recent change in medication or illness.  Did not skip food that morning.    She has history of migraines.  They used to be debilitating 15 to 20 years ago, in which she needed to be hospitalized.  They are now infrequent.  She hasn't had a migraine this past year.  She used to take topiramate and has rizatriptan on-hand if needed.  They are severe vice-like headache with nausea, phonophobia and phonophobia.    CBC and CMP in May were unremarkable.  TSH was 0.12 but free T4 was 1.3,.  PAST MEDICAL HISTORY: Past Medical History:  Diagnosis Date   Anemia    Arthritis    Asthma    Diabetes insipidus (HCC)    Fibromyalgia    Lupus (HCC)    Mitral valve prolapse    SLE (systemic lupus erythematosus) (HCC) 12/06/2012   Thyroid disease     PAST SURGICAL HISTORY: Past Surgical History:  Procedure Laterality Date   PITUITARY EXCISION     TONSILLECTOMY      MEDICATIONS: Current Outpatient Medications on File Prior to Visit  Medication Sig Dispense Refill   albuterol (PROVENTIL HFA;VENTOLIN HFA) 108 (90 BASE) MCG/ACT inhaler Inhale 1 puff into the lungs every 6 (six) hours as needed for wheezing or shortness of breath.     azaTHIOprine (  IMURAN) 50 MG tablet Take 1.5 tablets (75 mg total) by mouth daily. 135 tablet 0   levothyroxine (SYNTHROID, LEVOTHROID) 125 MCG tablet Take 125 mcg by mouth at bedtime.      MITIGARE 0.6 MG CAPS TAKE 1 CAPSULE BY MOUTH EVERY DAY 90 capsule 0   rizatriptan (MAXALT) 10 MG tablet Take 10 mg by mouth daily as needed for migraine. May repeat in 2 hours if needed     Turmeric 450 MG CAPS Take by mouth daily.      No current facility-administered medications on file prior to visit.    ALLERGIES: Allergies  Allergen Reactions   Iron Sucrose Rash    Chest pain   Morphine     Other reaction(s):  Vomiting   Venofer [Ferric Oxide] Palpitations    Dizziness, sweating, "felt like I was going to pass out" unknown when episode happened, itching   Azithromycin Other (See Comments)    "couldnt sleep"   Other     Other reaction(s): Other (See Comments) Magic mouth wash. Caused tongue to have a burning feeling    FAMILY HISTORY: Family History  Problem Relation Age of Onset   Atrial fibrillation Mother    Dementia Mother    Diabetes Father    Heart disease Father    Hyperlipidemia Father    Hypertension Father    Stroke Father    Diabetes Sister    Kidney disease Sister        transplant   Heart disease Sister        open heart surgery    Pancreatic disease Sister        transplant    Cancer Maternal Grandmother    Diabetes Paternal Grandfather    Heart disease Paternal Grandfather    Hyperlipidemia Paternal Grandfather    Hypertension Paternal Grandfather    Healthy Daughter    Healthy Son     Objective:  Blood pressure 99/62, pulse 79, height 5\' 6"  (1.676 m), weight 136 lb 6.4 oz (61.9 kg), last menstrual period 04/09/2014, SpO2 98 %. General: No acute distress.  Patient appears well-groomed.   Head:  Normocephalic/atraumatic Eyes:  fundi examined but not visualized Neck: supple, no paraspinal tenderness, full range of motion Back: No paraspinal tenderness Heart: regular rate and rhythm Lungs: Clear to auscultation bilaterally. Vascular: No carotid bruits. Neurological Exam: Mental status: alert and oriented to person, place, and time, recent and remote memory intact, fund of knowledge intact, attention and concentration intact, speech fluent and not dysarthric, language intact. Cranial nerves: CN I: not tested CN II: pupils equal, round and reactive to light, visual fields intact CN III, IV, VI:  full range of motion, no nystagmus, no ptosis CN V: facial sensation intact. CN VII: upper and lower face symmetric CN VIII: hearing intact CN IX, X: gag intact,  uvula midline CN XI: sternocleidomastoid and trapezius muscles intact CN XII: tongue midline Bulk & Tone: normal, no fasciculations. Motor:  muscle strength 5/5 throughout Sensation:  Pinprick, temperature and vibratory sensation intact. Deep Tendon Reflexes:  2+ throughout,  toes downgoing.   Finger to nose testing:  Without dysmetria.   Heel to shin:  Without dysmetria.   Gait:  Normal station and stride.  Romberg negative.    Thank you for allowing me to take part in the care of this patient.  06/08/2014, DO  CC: Shon Millet, MD

## 2020-11-24 ENCOUNTER — Ambulatory Visit
Admission: RE | Admit: 2020-11-24 | Discharge: 2020-11-24 | Disposition: A | Discharge status: Home / Self Care | Payer: 59 | Source: Ambulatory Visit | Attending: Neurology | Admitting: Neurology

## 2020-11-24 DIAGNOSIS — R404 Transient alteration of awareness: Secondary | ICD-10-CM

## 2020-11-24 MED ORDER — GADOBENATE DIMEGLUMINE 529 MG/ML IV SOLN
12.0000 mL | Freq: Once | INTRAVENOUS | Status: AC | PRN
  Administered 2020-11-24: 12 mL via INTRAVENOUS

## 2020-11-25 ENCOUNTER — Other Ambulatory Visit: Payer: Self-pay

## 2020-11-25 ENCOUNTER — Ambulatory Visit: Payer: 59 | Admitting: Neurology

## 2020-11-25 ENCOUNTER — Telehealth: Payer: Self-pay | Admitting: Neurology

## 2020-11-25 DIAGNOSIS — R4789 Other speech disturbances: Secondary | ICD-10-CM | POA: Diagnosis not present

## 2020-11-25 NOTE — Procedures (Signed)
ELECTROENCEPHALOGRAM REPORT  Date of Study: 11/25/2020  Patient's Name: Stefanie [name] MRN: [id number] Date of Birth: 1960/10/11  Clinical History: 60 year old female with 2 episodes of altered sensorium  Medications: PROVENTIL HFA;VENTOLIN HFA 108 (90 BASE) MCG/ACT inhaler IMURAN 50 MG tablet SYNTHROID, LEVOTHROID 125 MCG tablet MITIGARE 0.6 MG CAPS MAXALT 10 MG tablet Turmeric 450 MG CAPS  Technical Summary: A multichannel digital EEG recording measured by the international 10-20 system with electrodes applied with paste and impedances below 5000 ohms performed in our laboratory with EKG monitoring in an awake and asleep patient.  Photic stimulation was performed.  The digital EEG was referentially recorded, reformatted, and digitally filtered in a variety of bipolar and referential montages for optimal display.    Description: The patient is awake and asleep during the recording.  During maximal wakefulness, there is a symmetric, medium voltage 10 Hz posterior dominant rhythm that attenuates with eye opening.  The record is symmetric.  During drowsiness and sleep, there is an increase in theta slowing of the background.  Stage 2 sleep was seen.  Photic stimulation did not elicit any abnormalities.  There were no epileptiform discharges or electrographic seizures seen.    EKG lead was unremarkable.  Impression: This awake and asleep EEG is normal.    Clinical Correlation: A normal EEG does not exclude a clinical diagnosis of epilepsy.  If further clinical questions remain, prolonged EEG may be helpful.  Clinical correlation is advised.   Shon Millet, DO

## 2020-11-25 NOTE — Telephone Encounter (Signed)
Pt is returning a call about her MRI

## 2020-11-25 NOTE — Telephone Encounter (Signed)
See results note. 

## 2020-11-25 NOTE — Progress Notes (Signed)
Pt advised of her EEG and MRI Brain Results. Pt to f/u as needed.

## 2021-01-09 NOTE — Progress Notes (Signed)
Office Visit Note  Patient: Stefanie [name]             Date of Birth: May 09, 1960           MRN: [id number]             PCP: Shon Baton, MD Referring: Shon Baton, MD Visit Date: 01/23/2021 Occupation: _0 @  Subjective:  Left knee joint pain   History of Present Illness: Stefanie [name] is a 60 y.o. female with history of Sjogren's syndrome, osteoarthritis, and fibromyalgia.  Patient is currently on Imuran 75 mg daily.  She is tolerating Imuran without any side effects.  She felt that her symptoms were better controlled while taking Plaquenil but had to discontinue due to QT prolongation.  She presents today with increased pain in both knee joints and both ankle joints especially the left knee and right ankle.  Yesterday she had to work in [employer] on concrete for work which exacerbated her symptoms.  She is having swelling in the left knee and right ankle.  She takes colchicine on a daily basis as prescribed.  She states that her fibromyalgia is also flaring since having to lift heavy objects in the kitchen yesterday.  She denies any muscle spasms but is having trapezius muscle tension and tenderness bilaterally. She continues to have ongoing sicca symptoms and uses eyedrops as well as over-the-counter products for mouth dryness with minimal improvement.  She denies any cervical lymphadenopathy.  She currently is being treated for sinusitis.  She completed a course of cefdinir but continued on Imuran during that time that she has required a second round of antibiotics with Levaquin.   Activities of Daily Living:  Patient reports morning stiffness for 30 minutes.   Patient Reports nocturnal pain.  Difficulty dressing/grooming: Reports Difficulty climbing stairs: Reports Difficulty getting out of chair: Reports Difficulty using hands for taps, buttons, cutlery, and/or writing: Reports  Review of Systems  Constitutional:  Positive for fatigue.  HENT:  Positive for mouth  dryness. Negative for mouth sores and nose dryness.   Eyes:  Positive for dryness. Negative for pain and visual disturbance.  Respiratory:  Negative for cough, hemoptysis and difficulty breathing.   Cardiovascular:  Positive for swelling in legs/feet. Negative for chest pain, palpitations and hypertension.  Gastrointestinal:  Negative for blood in stool, constipation and diarrhea.  Endocrine: Negative for excessive thirst and increased urination.  Genitourinary:  Negative for difficulty urinating and painful urination.  Musculoskeletal:  Positive for joint pain, joint pain, joint swelling, muscle weakness, morning stiffness and muscle tenderness. Negative for myalgias and myalgias.  Skin:  Negative for color change, pallor, hair loss, nodules/bumps, skin tightness, ulcers and sensitivity to sunlight.  Allergic/Immunologic: Positive for susceptible to infections.  Neurological:  Negative for dizziness, numbness and headaches.  Hematological:  Negative for bruising/bleeding tendency and swollen glands.  Psychiatric/Behavioral:  Positive for sleep disturbance. Negative for depressed mood. The patient is not nervous/anxious.    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   Prolonged QT interval 07/28/2016   Primary osteoarthritis of both hands 05/11/2016   Primary osteoarthritis of both knees 05/11/2016  History of hypothyroidism 05/11/2016   History of migraine 05/11/2016   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, 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, 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, anticardiolipin antibody and positive rheumatoid factor 04/29/2016   ANA  positive 04/29/2016   Rheumatoid factor positive 04/29/2016   Pseudogout 04/29/2016   Chondrocalcinosis 04/29/2016   High risk medication use 04/29/2016   Sjogren's syndrome with keratoconjunctivitis sicca ([place]) 04/29/2016   History of neutropenia 04/29/2016   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   Hyponatremia 12/12/2013   Fever 12/13/2012   Pleuritic chest pain 12/06/2012   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   Anxiety 12/06/2012   ANEMIA-IRON DEFICIENCY 01/08/2010   GASTRIC DILATION, ACUTE 01/08/2010   NAUSEA ALONE 01/08/2010   ABDOMINAL PAIN, LEFT UPPER QUADRANT 01/08/2010    Past Medical History:  Diagnosis Date   Anemia    Arthritis    Asthma    Diabetes insipidus ([place])    Fibromyalgia    Lupus ([place])    Mitral valve prolapse    SLE (systemic lupus erythematosus) ([place]) 12/06/2012   Thyroid disease     Family History  Problem Relation Age of Onset   Atrial fibrillation Mother    Dementia Mother    Diabetes Father    Heart disease Father    Hyperlipidemia Father    Hypertension Father    Stroke Father    Diabetes Sister    Kidney disease Sister        transplant   Heart disease Sister        open heart surgery    Pancreatic disease Sister        transplant    Cancer Maternal Grandmother    Diabetes Paternal Grandfather    Heart disease Paternal Grandfather    Hyperlipidemia Paternal Grandfather    Hypertension Paternal [employer]chant [employer] officer    Healthy Daughter    Healthy Son    Past Surgical History:  Procedure Laterality Date   PITUITARY EXCISION     TONSILLECTOMY     Social History   Social History Narrative   Right handed   Immunization History  Administered Date(s) Administered   Influenza Inj Mdck Quad Pf 01/21/2020   Influenza-Unspecified 09/28/2018   PFIZER(Purple Top)SARS-COV-2 Vaccination 11/17/2019, 12/13/2019     Objective: Vital Signs: BP 117/78 (BP Location: Left Arm, Patient Position: Sitting, Cuff Size: Small)   Pulse 66   Resp  12   Ht _0  (1.676 m)   Wt 138 lb 12.8 oz (63 kg)   LMP 04/09/2014 (Approximate)   BMI 22.40 kg/m    Physical Exam Vitals and nursing note reviewed.  Constitutional:      Appearance: She is well-developed.  HENT:     Head: Normocephalic and atraumatic.  Eyes:     Conjunctiva/sclera: Conjunctivae normal.  Cardiovascular:     Rate and Rhythm: Normal rate and regular rhythm.     Heart sounds: Normal heart sounds.  Pulmonary:     Effort: Pulmonary effort is normal.     Breath sounds: Normal breath sounds.  Abdominal:     General: Bowel sounds are normal.     Palpations: Abdomen is soft.  Musculoskeletal:     Cervical back: Normal range of motion.  Lymphadenopathy:     Cervical: No cervical adenopathy.  Skin:    General: Skin is warm and dry.     Capillary Refill: Capillary refill takes less than 2 seconds.  Neurological:     Mental Status: She is alert and oriented to person, place, and time.  Psychiatric:  Behavior: Behavior normal.     Musculoskeletal Exam: C-spine limited ROM with lateral rotation.  Shoulder joints, elbow joints, wrist joints, MCPs, PIPs, and DIPs good ROM with no synovitis.  Complete fist formation bilaterally.  Hip joints have good range of motion with no groin pain.  Painful range of motion of both knee joints.  Small effusion of the left knee noted.  Ankle joints have good range of motion with tenderness but no inflammation.  CDAI Exam: CDAI Score: -- Patient Global: --; Provider Global: -- Swollen: --; Tender: -- Joint Exam 01/23/2021   No joint exam has been documented for this visit   There is currently no information documented on the homunculus. Go to the Rheumatology activity and complete the homunculus joint exam.  Investigation: No additional findings.  Imaging: XR KNEE 3 VIEW LEFT  Result Date: 01/23/2021 Moderate to severe lateral compartment narrowing with lateral, intercondylar and medial osteophytes were noted.  No  chondrocalcinosis was noted.  Moderate patellofemoral narrowing was noted. Impression: These findings are consistent with moderate to severe osteoarthritis and moderate chondromalacia patella.  Radiographic progression was noted when compared to the x-rays of 2020.  XR KNEE 3 VIEW RIGHT  Result Date: 01/23/2021 Moderate medial compartment narrowing with the medial intercondylar and lateral osteophytes were noted.  No chondrocalcinosis was noted.  Moderate patellofemoral narrowing was noted. Impression: These findings are consistent with moderate osteoarthritis and moderate chondromalacia patella.   Recent Labs: Lab Results  Component Value Date   WBC 2.9 (L) 10/04/2020   HGB 12.4 10/04/2020   PLT 211 10/04/2020   NA 140 10/04/2020   K 4.3 10/04/2020   CL 104 10/04/2020   CO2 29 10/04/2020   GLUCOSE 83 10/04/2020   BUN 13 10/04/2020   CREATININE 0.65 10/04/2020   BILITOT 0.5 10/04/2020   ALKPHOS 82 05/15/2020   AST 27 10/04/2020   ALT 17 10/04/2020   PROT 7.1 10/04/2020   ALBUMIN 4.1 05/15/2020   CALCIUM 9.2 10/04/2020   GFRAA 102 04/24/2020   QFTBGOLDPLUS NEGATIVE 10/16/2019    Speciality Comments: PLQ Eye Exam 02/26/2020 WNL @ [facility] Ophthalmology follow up in 1 year  Procedures:  Large Joint Inj: L knee on 01/23/2021 9:11 AM Indications: pain Details: 22 G 1.5 in needle, medial approach  Arthrogram: No  Medications: 3 mL lidocaine 1 %; 60 mg triamcinolone acetonide 40 MG/ML Aspirate: 0 mL Outcome: tolerated well, no immediate complications Procedure, treatment alternatives, risks and benefits explained, specific risks discussed. Consent was given by the patient. Immediately prior to procedure a time out was called to verify the correct patient, procedure, equipment, support staff and site/side marked as required. Patient was prepped and draped in the usual sterile fashion.    Allergies: Iron sucrose, Morphine, Venofer [ferric oxide], Azithromycin, and Other       Assessment / Plan:     Visit Diagnoses: Sjogren's syndrome with keratoconjunctivitis sicca ([facility]) - Positive ANA, positive Ro, positive La, positive RF sicca symptoms, inflammatory arthritis: Patient presents today with ongoing fatigue, arthralgias, joint stiffness, and sicca symptoms.  Discussed the use of over-the-counter products for sicca symptom relief.  I also discussed the importance of regular exercise and good sleep hygiene to improve her level of fatigue.  She is currently taking Imuran 75 mg daily which she has been tolerating without any side effects.  She presents today with a small to moderate effusion in the left knee joint.  Yesterday she was standing for prolonged period of time on concrete while at  work which exacerbated her symptoms.  X-rays of both knees were updated today in the left knee revealed radiographic progression showing moderate osteoarthritis and moderate chondromalacia patella.  I left knee joint aspiration was attempted but no fluid was drawn off.  The left knee was injected with cortisone as discussed above.  Aftercare was discussed.  She was advised to notify us if her discomfort persists or worsens.  She will remain on the current dose of Imuran and colchicine.  Lab work from 10/04/2020 was reviewed today in the office: ANA positive 1:320NS, dsDNA negative, ESR within normal limits, complements WNL, UA normal, and Ro positive.  She is due to update lab work today.  Orders were released.  She is advised to notify us if she develops any new or worsening symptoms.  She will follow-up in the office in 3 months. - Plan: Urinalysis, Routine w reflex microscopic, Anti-DNA antibody, double-stranded, C3 and C4, Sedimentation rate, Rheumatoid factor, Serum protein electrophoresis with reflex  High risk medication use - Imuran 75 mg by mouth daily. CBC and CMP were drawn on 10/04/2020.  She is due to update lab work today.  Orders for CBC and CMP were released.  Her next lab work  will be due in February and every 3 months to monitor for drug toxicity.  PLQ Eye Exam 02/26/2020 WNL @ [facility] Ophthalmology. - Plan: CBC with Differential/Platelet, COMPLETE METABOLIC PANEL WITH GFR Discussed the importance of holding imuran while taking Levaquin and until the infection has completely cleared.  Discussed the risks of continuing immunosuppressive therapy while on antibiotics.   Other drug-induced neutropenia ([place]) - Most likely due to medication use.  She is also on Imuran 75 mg daily. 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 Mannam.High-resolution chest CT ordered on 08/01/2019: Patient has high-resolution CT on September 24, 2020 which was a stable.   Primary osteoarthritis of both hands: Discussed the list of natural anti-inflammatories in detail.  She is currently taking turmeric but plans on adding fish oil, tart cherry, and ginger to her daily regimen.  Primary osteoarthritis of both knees -Chronic pain.  She presents today with increased pain and stiffness in both knee joints.  She has a mild to moderate effusion in the left knee joint on examination today.  X-rays of both knees were updated. The left knee joint was injected with cortisone today. She tolerated the procedure well. Plan: XR KNEE 3 VIEW LEFT, XR KNEE 3 VIEW RIGHT  Effusion, left knee - She presents today with a mild to moderate effusion in the left knee.  She has chronic left knee joint pain which was exacerbated yesterday while standing for 3 hours on concrete at work.  X-rays of the left knee updated today. Radiographic progression was noted. Dr. Estanislado Pandy attempted to aspirate the left knee but no fluid was drawn off.  The left knee joint was injected with cortisone and the procedure note was completed above.  Aftercare was discussed. Plan: Large Joint Inj: L knee, XR KNEE 3 VIEW  LEFT  Chondromalacia patellae, right knee: She has good range of motion of the right knee joint with crepitus but no warmth or effusion was noted.x-rays of the right knee updated today-no progression noted.   Chondrocalcinosis -She presented today with a left knee joint effusion. X-rays were updated and the left knee was injected with cortisone.  Unfortunately no fluid was able  to be aspirated to be sent for analysis. She is currently taking colchicine 0.6 mg 1 capsule by mouth daily.  She continues to tolerate colchicine without any side effects.  A refill was sent to the pharmacy on 01/15/2021.  Cervical spondylosis without myelopathy: She has limited range of motion of the C-spine with some discomfort.  Trapezius muscle tension and tenderness bilaterally.  No symptoms of radiculopathy at this time.  Fibromyalgia: She is currently having a fibromyalgia flare.  She is experiencing generalized myalgias and muscle tenderness on a regular basis.  She continues to have chronic fatigue secondary to insomnia.  She is only been sleeping about 4 hours per night.  She remains under tremendous amount of stress caring for her family members as well as working full-time.  Discussed the importance of regular exercise and good sleep hygiene.  She would benefit from water aerobics or water therapy in the future when she has more time.  Other fatigue: Chronic and secondary to insomnia.  Discussed the importance of regular exercise.  Muscle cramping: Bilateral LE-Dr. Estanislado Pandy discussed trying magnesium malate OTC for muscle cramps.  SE were discussed over the phone. See telephone encounter.   Other medical conditions are listed as follows:   History of diabetes insipidus  History of anxiety  History of migraine  Prolonged QT interval: She is not a good candidate for the use of PLQ.   History of hypothyroidism    Orders: Orders Placed This Encounter  Procedures   Large Joint Inj: L knee   XR KNEE 3  VIEW LEFT   XR KNEE 3 VIEW RIGHT   CBC with Differential/Platelet   COMPLETE METABOLIC PANEL WITH GFR   Urinalysis, Routine w reflex microscopic   Anti-DNA antibody, double-stranded   C3 and C4   Sedimentation rate   Rheumatoid factor   Serum protein electrophoresis with reflex   No orders of the defined types were placed in this encounter.    Follow-Up Instructions: Return in about 3 months (around 04/25/2021) for Sjogren's syndrome, Osteoarthritis, Fibromyalgia.   Ofilia Neas, PA-C  Note - This record has been created using Dragon software.  Chart creation errors have been sought, but may not always  have been located. Such creation errors do not reflect on  the standard of medical care.

## 2021-01-15 ENCOUNTER — Other Ambulatory Visit: Payer: Self-pay | Admitting: Physician Assistant

## 2021-01-15 NOTE — Telephone Encounter (Signed)
Please change Imuran to 75 mg p.o. daily.  Patient needs to get labs as soon as possible as she had neutropenia.

## 2021-01-15 NOTE — Telephone Encounter (Signed)
Next Visit: 01/23/2021   Last Visit: 10/25/2020  Last Fill: 05/30/2020 (Imuran), 03/18/2020 (Mitigare)   DX: Sjogren's syndrome with keratoconjunctivitis sicca Chondrocalcinosis   Current Dose per office note 10/25/2020: Imuran 100 mg po qd, colchicine 0.6 mg 1 capsule by mouth daily.  Per lab note 10/04/2020: reduce imuran to 75 mg daily and continue on current dose of PLQ.    Labs: 10/04/2020 CMP WNL.  WBC count is low-2.9.  absolute lymphocyte count is low.   Left message to advise patient she is due to update labs.    Okay to refill Mitigare and Imuran? Please advise which dose we should refill of Imuran.

## 2021-01-23 ENCOUNTER — Other Ambulatory Visit: Payer: Self-pay

## 2021-01-23 ENCOUNTER — Encounter: Payer: Self-pay | Admitting: Physician Assistant

## 2021-01-23 ENCOUNTER — Telehealth: Payer: Self-pay | Admitting: Rheumatology

## 2021-01-23 ENCOUNTER — Ambulatory Visit: Payer: 59 | Admitting: Physician Assistant

## 2021-01-23 ENCOUNTER — Ambulatory Visit: Payer: Self-pay

## 2021-01-23 ENCOUNTER — Encounter: Payer: Self-pay | Admitting: *Deleted

## 2021-01-23 VITALS — BP 117/78 | HR 66 | Resp 12 | Ht 66.0 in | Wt 138.8 lb

## 2021-01-23 DIAGNOSIS — Z8669 Personal history of other diseases of the nervous system and sense organs: Secondary | ICD-10-CM

## 2021-01-23 DIAGNOSIS — D702 Other drug-induced agranulocytosis: Secondary | ICD-10-CM

## 2021-01-23 DIAGNOSIS — M797 Fibromyalgia: Secondary | ICD-10-CM

## 2021-01-23 DIAGNOSIS — J842 Lymphoid interstitial pneumonia: Secondary | ICD-10-CM | POA: Diagnosis not present

## 2021-01-23 DIAGNOSIS — M19042 Primary osteoarthritis, left hand: Secondary | ICD-10-CM

## 2021-01-23 DIAGNOSIS — M3501 Sicca syndrome with keratoconjunctivitis: Secondary | ICD-10-CM

## 2021-01-23 DIAGNOSIS — M19041 Primary osteoarthritis, right hand: Secondary | ICD-10-CM

## 2021-01-23 DIAGNOSIS — M25462 Effusion, left knee: Secondary | ICD-10-CM

## 2021-01-23 DIAGNOSIS — M1712 Unilateral primary osteoarthritis, left knee: Secondary | ICD-10-CM

## 2021-01-23 DIAGNOSIS — Z79899 Other long term (current) drug therapy: Secondary | ICD-10-CM

## 2021-01-23 DIAGNOSIS — R9431 Abnormal electrocardiogram [ECG] [EKG]: Secondary | ICD-10-CM

## 2021-01-23 DIAGNOSIS — Z8639 Personal history of other endocrine, nutritional and metabolic disease: Secondary | ICD-10-CM

## 2021-01-23 DIAGNOSIS — R5383 Other fatigue: Secondary | ICD-10-CM

## 2021-01-23 DIAGNOSIS — Z8659 Personal history of other mental and behavioral disorders: Secondary | ICD-10-CM

## 2021-01-23 DIAGNOSIS — M1711 Unilateral primary osteoarthritis, right knee: Secondary | ICD-10-CM | POA: Diagnosis not present

## 2021-01-23 DIAGNOSIS — M1120 Other chondrocalcinosis, unspecified site: Secondary | ICD-10-CM

## 2021-01-23 DIAGNOSIS — M2241 Chondromalacia patellae, right knee: Secondary | ICD-10-CM

## 2021-01-23 DIAGNOSIS — R252 Cramp and spasm: Secondary | ICD-10-CM

## 2021-01-23 DIAGNOSIS — M47812 Spondylosis without myelopathy or radiculopathy, cervical region: Secondary | ICD-10-CM

## 2021-01-23 DIAGNOSIS — M170 Bilateral primary osteoarthritis of knee: Secondary | ICD-10-CM

## 2021-01-23 MED ORDER — LIDOCAINE HCL 1 % IJ SOLN
3.0000 mL | INTRAMUSCULAR | Status: AC | PRN
Start: 2021-01-23 — End: 2021-01-23
  Administered 2021-01-23: 3 mL

## 2021-01-23 MED ORDER — TRIAMCINOLONE ACETONIDE 40 MG/ML IJ SUSP
60.0000 mg | INTRAMUSCULAR | Status: AC | PRN
  Administered 2021-01-23: 60 mg via INTRA_ARTICULAR

## 2021-01-23 NOTE — Progress Notes (Signed)
Please notify the patient that the right knee x-ray results are consistent with moderate OA and moderate chondromalacia patella.

## 2021-01-23 NOTE — Telephone Encounter (Signed)
Patient calling to let you know she is having severe cramps in her ankles for about 2-3 months now. She forgot to mention that earlier. Please call if you need to advise her of anything further.

## 2021-01-23 NOTE — Progress Notes (Signed)
Please call the patient to review x-ray results:   Left knee consistent with moderate to severe OA and moderate chondromalacia patella.  There has been radiographic progression since 2020.

## 2021-01-23 NOTE — Telephone Encounter (Signed)
I returned patient's call.  She states she does not have any ankle swelling.  She gets only muscle cramps when she walks.  I advised her to take magnesium malate 250 mg at bedtime.  Side effects of magnesium were discussed.

## 2021-01-24 NOTE — Progress Notes (Signed)
White cell count is low and stable.  CMP is normal.  UA is negative.  Complements are normal.  Sed rate is elevated at 41.  Rheumatoid factor is mildly elevated and stable.  Double-stranded DNA and SPEP are pending

## 2021-01-28 LAB — CBC WITH DIFFERENTIAL/PLATELET
Absolute Monocytes: 456 cells/uL (ref 200–950)
Basophils Absolute: 20 cells/uL (ref 0–200)
Basophils Relative: 0.6 %
Eosinophils Absolute: 92 cells/uL (ref 15–500)
Eosinophils Relative: 2.7 %
HCT: 41.8 % (ref 35.0–45.0)
Hemoglobin: 14 g/dL (ref 11.7–15.5)
Lymphs Abs: 908 cells/uL (ref 850–3900)
MCH: 30.4 pg (ref 27.0–33.0)
MCHC: 33.5 g/dL (ref 32.0–36.0)
MCV: 90.7 fL (ref 80.0–100.0)
MPV: 11.1 fL (ref 7.5–12.5)
Monocytes Relative: 13.4 %
Neutro Abs: 1924 cells/uL (ref 1500–7800)
Neutrophils Relative %: 56.6 %
Platelets: 236 10*3/uL (ref 140–400)
RBC: 4.61 10*6/uL (ref 3.80–5.10)
RDW: 13.1 % (ref 11.0–15.0)
Total Lymphocyte: 26.7 %
WBC: 3.4 10*3/uL — ABNORMAL LOW (ref 3.8–10.8)

## 2021-01-28 LAB — C3 AND C4
C3 Complement: 150 mg/dL (ref 83–193)
C4 Complement: 34 mg/dL (ref 15–57)

## 2021-01-28 LAB — COMPLETE METABOLIC PANEL WITH GFR
AG Ratio: 1.2 (calc) (ref 1.0–2.5)
ALT: 17 U/L (ref 6–29)
AST: 28 U/L (ref 10–35)
Albumin: 4.6 g/dL (ref 3.6–5.1)
Alkaline phosphatase (APISO): 115 U/L (ref 37–153)
BUN: 16 mg/dL (ref 7–25)
CO2: 30 mmol/L (ref 20–32)
Calcium: 10.1 mg/dL (ref 8.6–10.4)
Chloride: 100 mmol/L (ref 98–110)
Creat: 0.66 mg/dL (ref 0.50–1.05)
Globulin: 3.7 g/dL (calc) (ref 1.9–3.7)
Glucose, Bld: 82 mg/dL (ref 65–99)
Potassium: 4.3 mmol/L (ref 3.5–5.3)
Sodium: 139 mmol/L (ref 135–146)
Total Bilirubin: 0.7 mg/dL (ref 0.2–1.2)
Total Protein: 8.3 g/dL — ABNORMAL HIGH (ref 6.1–8.1)
eGFR: 100 mL/min/{1.73_m2} (ref >=60)

## 2021-01-28 LAB — URINALYSIS, ROUTINE W REFLEX MICROSCOPIC
Bilirubin Urine: NEGATIVE
Glucose, UA: NEGATIVE
Hgb urine dipstick: NEGATIVE
Ketones, ur: NEGATIVE
Leukocytes,Ua: NEGATIVE
Nitrite: NEGATIVE
Protein, ur: NEGATIVE
Specific Gravity, Urine: 1.012 (ref 1.001–1.035)
pH: 7 (ref 5.0–8.0)

## 2021-01-28 LAB — PROTEIN ELECTROPHORESIS, SERUM, WITH REFLEX
Albumin ELP: 4.4 g/dL (ref 3.8–4.8)
Alpha 1: 0.3 g/dL (ref 0.2–0.3)
Alpha 2: 0.8 g/dL (ref 0.5–0.9)
Beta 2: 0.4 g/dL (ref 0.2–0.5)
Beta Globulin: 0.5 g/dL (ref 0.4–0.6)
Gamma Globulin: 1.5 g/dL (ref 0.8–1.7)
Total Protein: 8 g/dL (ref 6.1–8.1)

## 2021-01-28 LAB — RHEUMATOID FACTOR: Rheumatoid fact SerPl-aCnc: 14 IU/mL — ABNORMAL HIGH (ref <14)

## 2021-01-28 LAB — ANTI-DNA ANTIBODY, DOUBLE-STRANDED: ds DNA Ab: 1 IU/mL

## 2021-01-28 LAB — SEDIMENTATION RATE: Sed Rate: 41 mm/h — ABNORMAL HIGH (ref 0–30)

## 2021-01-28 NOTE — Progress Notes (Signed)
SPEP did not reveal any abnormal proteins.  dsDNA is negative.

## 2021-03-26 ENCOUNTER — Emergency Department (HOSPITAL_BASED_OUTPATIENT_CLINIC_OR_DEPARTMENT_OTHER)
Admission: EM | Admit: 2021-03-26 | Discharge: 2021-03-26 | Disposition: A | Discharge status: Home / Self Care | Payer: 59 | Attending: Emergency Medicine | Admitting: Emergency Medicine

## 2021-03-26 ENCOUNTER — Other Ambulatory Visit (HOSPITAL_BASED_OUTPATIENT_CLINIC_OR_DEPARTMENT_OTHER): Payer: Self-pay

## 2021-03-26 ENCOUNTER — Encounter (HOSPITAL_BASED_OUTPATIENT_CLINIC_OR_DEPARTMENT_OTHER): Payer: Self-pay

## 2021-03-26 ENCOUNTER — Other Ambulatory Visit: Payer: Self-pay

## 2021-03-26 ENCOUNTER — Emergency Department (HOSPITAL_BASED_OUTPATIENT_CLINIC_OR_DEPARTMENT_OTHER): Payer: 59

## 2021-03-26 ENCOUNTER — Emergency Department (HOSPITAL_BASED_OUTPATIENT_CLINIC_OR_DEPARTMENT_OTHER): Payer: 59 | Admitting: Radiology

## 2021-03-26 DIAGNOSIS — R0602 Shortness of breath: Secondary | ICD-10-CM | POA: Diagnosis not present

## 2021-03-26 DIAGNOSIS — U071 COVID-19: Secondary | ICD-10-CM | POA: Insufficient documentation

## 2021-03-26 DIAGNOSIS — R509 Fever, unspecified: Secondary | ICD-10-CM | POA: Diagnosis present

## 2021-03-26 DIAGNOSIS — J45909 Unspecified asthma, uncomplicated: Secondary | ICD-10-CM | POA: Diagnosis not present

## 2021-03-26 DIAGNOSIS — Z79899 Other long term (current) drug therapy: Secondary | ICD-10-CM | POA: Insufficient documentation

## 2021-03-26 DIAGNOSIS — R0789 Other chest pain: Secondary | ICD-10-CM | POA: Insufficient documentation

## 2021-03-26 LAB — URINALYSIS, ROUTINE W REFLEX MICROSCOPIC
Bilirubin Urine: NEGATIVE
Glucose, UA: NEGATIVE mg/dL
Hgb urine dipstick: NEGATIVE
Ketones, ur: NEGATIVE mg/dL
Leukocytes,Ua: NEGATIVE
Nitrite: NEGATIVE
Protein, ur: NEGATIVE mg/dL
Specific Gravity, Urine: 1.009 (ref 1.005–1.030)
pH: 7 (ref 5.0–8.0)

## 2021-03-26 LAB — RESP PANEL BY RT-PCR (FLU A&B, COVID) ARPGX2
Influenza A by PCR: NEGATIVE
Influenza B by PCR: NEGATIVE
SARS Coronavirus 2 by RT PCR: POSITIVE — AB

## 2021-03-26 LAB — CBC WITH DIFFERENTIAL/PLATELET
Abs Immature Granulocytes: 0.02 10*3/uL (ref 0.00–0.07)
Basophils Absolute: 0 10*3/uL (ref 0.0–0.1)
Basophils Relative: 1 %
Eosinophils Absolute: 0.1 10*3/uL (ref 0.0–0.5)
Eosinophils Relative: 1 %
HCT: 41.7 % (ref 36.0–46.0)
Hemoglobin: 13.7 g/dL (ref 12.0–15.0)
Immature Granulocytes: 1 %
Lymphocytes Relative: 10 %
Lymphs Abs: 0.4 10*3/uL — ABNORMAL LOW (ref 0.7–4.0)
MCH: 29.9 pg (ref 26.0–34.0)
MCHC: 32.9 g/dL (ref 30.0–36.0)
MCV: 91 fL (ref 80.0–100.0)
Monocytes Absolute: 0.4 10*3/uL (ref 0.1–1.0)
Monocytes Relative: 11 %
Neutro Abs: 2.9 10*3/uL (ref 1.7–7.7)
Neutrophils Relative %: 76 %
Platelets: 201 10*3/uL (ref 150–400)
RBC: 4.58 MIL/uL (ref 3.87–5.11)
RDW: 14.5 % (ref 11.5–15.5)
WBC: 3.7 10*3/uL — ABNORMAL LOW (ref 4.0–10.5)
nRBC: 0 % (ref 0.0–0.2)

## 2021-03-26 LAB — BASIC METABOLIC PANEL
Anion gap: 10 (ref 5–15)
BUN: 13 mg/dL (ref 8–23)
CO2: 29 mmol/L (ref 22–32)
Calcium: 10.5 mg/dL — ABNORMAL HIGH (ref 8.9–10.3)
Chloride: 97 mmol/L — ABNORMAL LOW (ref 98–111)
Creatinine, Ser: 0.89 mg/dL (ref 0.44–1.00)
GFR, Estimated: >60 mL/min (ref >60)
Glucose, Bld: 70 mg/dL (ref 70–99)
Potassium: 3.8 mmol/L (ref 3.5–5.1)
Sodium: 136 mmol/L (ref 135–145)

## 2021-03-26 LAB — D-DIMER, QUANTITATIVE: D-Dimer, Quant: 2.79 ug/mL-FEU — ABNORMAL HIGH (ref 0.00–0.50)

## 2021-03-26 MED ORDER — SODIUM CHLORIDE 0.9 % IV BOLUS
1000.0000 mL | Freq: Once | INTRAVENOUS | Status: AC
  Administered 2021-03-26: 1000 mL via INTRAVENOUS

## 2021-03-26 MED ORDER — FLUTICASONE PROPIONATE 50 MCG/ACT NA SUSP: 1.0000 | Freq: Every day | NASAL | 0 refills | Status: DC

## 2021-03-26 MED ORDER — NIRMATRELVIR/RITONAVIR (PAXLOVID)TABLET: 3.0000 | ORAL_TABLET | Freq: Two times a day (BID) | ORAL | 0 refills | Status: AC

## 2021-03-26 MED ORDER — GUAIFENESIN 100 MG/5ML PO LIQD
5.0000 mL | ORAL | 0 refills | Status: DC | PRN
  Filled 2021-03-26: qty 120, 4d supply, fill #0

## 2021-03-26 MED ORDER — FLUTICASONE PROPIONATE 50 MCG/ACT NA SUSP
1.0000 | Freq: Every day | NASAL | 0 refills | Status: DC
Start: 2021-03-26 — End: 2021-03-26
  Filled 2021-03-26: qty 16, 30d supply, fill #0

## 2021-03-26 MED ORDER — NIRMATRELVIR/RITONAVIR (PAXLOVID)TABLET: 3.0000 | ORAL_TABLET | Freq: Two times a day (BID) | ORAL | 0 refills | Status: DC

## 2021-03-26 MED ORDER — KETOROLAC TROMETHAMINE 15 MG/ML IJ SOLN
15.0000 mg | Freq: Once | INTRAMUSCULAR | Status: AC
  Administered 2021-03-26: 15 mg via INTRAVENOUS
  Filled 2021-03-26: qty 1

## 2021-03-26 MED ORDER — GUAIFENESIN 100 MG/5ML PO LIQD: 5.0000 mL | ORAL | 0 refills | Status: DC | PRN

## 2021-03-26 MED ORDER — IOHEXOL 350 MG/ML SOLN
75.0000 mL | Freq: Once | INTRAVENOUS | Status: AC | PRN
  Administered 2021-03-26: 75 mL via INTRAVENOUS

## 2021-03-26 MED ORDER — NIRMATRELVIR/RITONAVIR (PAXLOVID)TABLET
3.0000 | ORAL_TABLET | Freq: Two times a day (BID) | ORAL | 0 refills | Status: DC
  Filled 2021-03-26: qty 30, 5d supply, fill #0

## 2021-03-26 MED ORDER — ACETAMINOPHEN 325 MG PO TABS
650.0000 mg | ORAL_TABLET | Freq: Once | ORAL | Status: AC
Start: 2021-03-26 — End: 2021-03-26
  Administered 2021-03-26: 650 mg via ORAL
  Filled 2021-03-26: qty 2

## 2021-03-26 NOTE — Discharge Instructions (Addendum)
Please do not take your MITIGARE/colchicine while taking PAXLOVID. DO not resume colchicine/mitigare until 5 days after completion of your PAXLOVID.     It was a pleasure caring for you today in the emergency department.  Please return to the emergency department for any worsening or worrisome symptoms.

## 2021-03-26 NOTE — ED Triage Notes (Signed)
102 fever this am & took at home COVID test that was positive. Hx of lupus. Tylenol last night with minimal relief. Chills/body aches. Slight SOB & chest pain. Minimal cough. PCP sent here to r/o any issues.

## 2021-03-26 NOTE — ED Provider Notes (Signed)
[facility] EMERGENCY DEPT Provider Note   CSN: [id number] Arrival date & time: 03/26/21  [id number]     History  Chief Complaint  Patient presents with   Fever    Stefanie [name] is a 61 y.o. female.  This is a 61 y.o. female  with significant medical history as below, including asthma, lupus, arthritis who presents to the ED with complaint of [place].  Patient began to have chills, subjective fevers over the past 3 days.  Myalgias.  Home COVID test was positive.  She called PCP who told her to come to the ER for evaluation.  Patient has been having some generalized fatigue.  No chest pain.  Some mild exertional dyspnea.  No rashes.  No change in bowel or bladder function.  No nausea or vomiting.  She has been able to tolerate oral intake without difficulty.  Been compliant with her home medications.  No other acute medical complaints offered.  Patient ports that she was sent by her PCP to rule out a possible blood clot.  She has no history of DVT or PE.     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 No language interpreter was used.  Fever Associated symptoms: congestion, myalgias and rhinorrhea   Associated symptoms: no chest pain, no chills, no confusion, no cough, no headaches, no nausea, no rash and no vomiting       Home Medications Prior to Admission medications   Medication Sig Start Date End Date Taking? Authorizing Provider  albuterol (PROVENTIL HFA;VENTOLIN HFA) 108 (90 BASE) MCG/ACT inhaler Inhale 1 puff into the lungs every 6 (six) hours as needed for wheezing or shortness of breath.    [provider]  azaTHIOprine (IMURAN) 50 MG tablet Take 1.5 tablets (75 mg  total) by mouth daily. 01/15/21   Bo Merino, MD  fluticasone (FLONASE) 50 MCG/ACT nasal spray Place 1 spray into both nostrils daily for 7 days. 03/26/21 04/02/21  Fransico Meadow, PA-C  guaiFENesin (ROBITUSSIN) 100 MG/5ML liquid Take 5 mLs by mouth every 4 (four) hours as needed for cough or to loosen phlegm. 03/26/21   Fransico Meadow, PA-C  levothyroxine (SYNTHROID, LEVOTHROID) 125 MCG tablet Take 125 mcg by mouth at bedtime.  06/14/14   [provider]  MITIGARE 0.6 MG CAPS TAKE 1 CAPSULE BY MOUTH EVERY DAY 01/15/21   Bo Merino, MD  nirmatrelvir/ritonavir EUA (PAXLOVID) 20 x 150 MG & 10 x 100MG  TABS Take 3 tablets by mouth 2 (two) times daily for 5 days. 03/26/21 03/31/21  Fransico Meadow, PA-C  prednisoLONE acetate (PRED FORTE) 1 % ophthalmic suspension SMARTSIG:In Eye(s) 11/26/20   [provider]  rizatriptan (MAXALT) 10 MG tablet Take 10 mg by mouth daily as needed for migraine. May repeat in 2 hours if needed    [provider]  Turmeric 450 MG CAPS Take by mouth daily.     [provider]      Allergies    Iron sucrose, Morphine, Venofer [ferric oxide], Azithromycin, and Other    Review of Systems   Review of Systems  Constitutional:  Positive for fatigue and fever. Negative for chills.  HENT:  Positive for congestion and rhinorrhea. Negative for facial swelling and  trouble swallowing.   Eyes:  Negative for photophobia and visual disturbance.  Respiratory:  Positive for shortness of breath. Negative for cough.   Cardiovascular:  Negative for chest pain and palpitations.  Gastrointestinal:  Negative for abdominal pain, nausea and vomiting.  Endocrine: Negative for polydipsia and polyuria.  Genitourinary:  Negative for difficulty urinating and hematuria.  Musculoskeletal:  Positive for arthralgias and myalgias. Negative for gait problem and joint swelling.  Skin:  Negative for pallor and rash.  Neurological:  Negative for syncope and  headaches.  Psychiatric/Behavioral:  Negative for agitation and confusion.    Physical Exam Updated Vital Signs BP 106/70    Pulse 66    Temp (!) 100.8 F (38.2 C)    Resp 15    Ht 5\' 6"  (1.676 m)    Wt 61.2 kg    LMP 04/09/2014 (Approximate)    SpO2 99%    BMI 21.79 kg/m  Physical Exam Vitals and nursing note reviewed.  Constitutional:      General: She is not in acute distress.    Appearance: Normal appearance.  HENT:     Head: Normocephalic and atraumatic.     Right Ear: External ear normal.     Left Ear: External ear normal.     Nose: Nose normal.     Mouth/Throat:     Mouth: Mucous membranes are moist.  Eyes:     General: No scleral icterus.       Right eye: No discharge.        Left eye: No discharge.  Cardiovascular:     Rate and Rhythm: Normal rate and regular rhythm.     Pulses: Normal pulses.     Heart sounds: Normal heart sounds.  Pulmonary:     Effort: Pulmonary effort is normal. No respiratory distress.     Breath sounds: Normal breath sounds.  Abdominal:     General: Abdomen is flat.     Tenderness: There is no abdominal tenderness.  Musculoskeletal:        General: Normal range of motion.     Cervical back: Normal range of motion.     Right lower leg: No edema.     Left lower leg: No edema.  Skin:    General: Skin is warm and dry.     Capillary Refill: Capillary refill takes less than 2 seconds.  Neurological:     Mental Status: She is alert and oriented to person, place, and time.     GCS: GCS eye subscore is 4. GCS verbal subscore is 5. GCS motor subscore is 6.  Psychiatric:        Mood and Affect: Mood normal.        Behavior: Behavior normal.    ED Results / Procedures / Treatments   Labs (all labs ordered are listed, but only abnormal results are displayed) Labs Reviewed  RESP PANEL BY RT-PCR (FLU A&B, COVID) ARPGX2 - Abnormal; Notable for the following components:      Result Value   SARS Coronavirus 2 by RT PCR POSITIVE (*)    All other  components within normal limits  CBC WITH DIFFERENTIAL/PLATELET - Abnormal; Notable for the following components:   WBC 3.7 (*)    Lymphs Abs 0.4 (*)    All other components within normal limits  BASIC METABOLIC PANEL - Abnormal; Notable for the following components:   Chloride 97 (*)    Calcium 10.5 (*)    All other components within normal limits  D-DIMER, QUANTITATIVE - Abnormal; Notable for the following components:   D-Dimer, Quant 2.79 (*)    All other components within normal limits  URINALYSIS, ROUTINE W REFLEX MICROSCOPIC    EKG None  Radiology DG Chest 2 View  Result Date: 03/26/2021 CLINICAL DATA:  Shortness of breath EXAM: CHEST - 2 VIEW COMPARISON:  October 2019, CT 2022 FINDINGS: Scattered lucent areas likely correspond to cysts seen on prior CT. Scarring at the lung apices. No consolidation. No pleural effusion. Normal heart size. No acute osseous abnormality. IMPRESSION: No acute process in the chest. Electronically Signed   By: Macy Mis M.D.   On: 03/26/2021 10:55   CT Angio Chest PE W and/or Wo Contrast  Result Date: 03/26/2021 CLINICAL DATA:  Fever. Positive COVID test. History of lupus. Shortness of breath and chest pain. EXAM: CT ANGIOGRAPHY CHEST WITH CONTRAST TECHNIQUE: Multidetector CT imaging of the chest was performed using the standard protocol during bolus administration of intravenous contrast. Multiplanar CT image reconstructions and MIPs were obtained to evaluate the vascular anatomy. RADIATION DOSE REDUCTION: This exam was performed according to the departmental dose-optimization program which includes automated exposure control, adjustment of the mA and/or kV according to patient size and/or use of iterative reconstruction technique. CONTRAST:  68mL OMNIPAQUE IOHEXOL 350 MG/ML SOLN COMPARISON:  None. FINDINGS: Cardiovascular: The heart is normal in size. No pericardial effusion. Mild tortuosity of the thoracic aorta but no aneurysm or dissection.  Calcifications are noted near the aortic valve. Minimal coronary artery calcifications. Incidental note is made of an aberrant right subclavian artery coursing behind the esophagus trachea. The pulmonary arterial tree is well opacified. No filling defects to suggest pulmonary embolism. Mediastinum/Nodes: No mediastinal or hilar mass or adenopathy. The esophagus is grossly normal. Lungs/Pleura: Chronic emphysematous changes with biapical pleural and parenchymal scarring. A few patchy areas of subsegmental atelectasis but no pulmonary infiltrates or pleural effusions. No worrisome pulmonary lesions. Stable bilateral pulmonary nodules. No new or progressive nodules. 5 mm perifissural nodule in the right upper lobe on image number 71/6. 5 mm left lower lobe nodule on image 69/6 6.5 mm left lower lobe nodule on image number 85/6. 5.5 mm nodule in the left lower lobe on image 103/6 Upper Abdomen: No significant upper abdominal findings. Musculoskeletal: No significant bony findings. No breast masses, supraclavicular or axillary adenopathy. Review of the MIP images confirms the above findings. IMPRESSION: 1. No CT findings for pulmonary embolism. 2. Chronic emphysematous changes but no acute pulmonary findings. 3. Stable bilateral pulmonary nodules. No new or progressive nodules. 4. No mediastinal or hilar mass or adenopathy. 5. Aberrant right subclavian artery. Aortic Atherosclerosis (ICD10-I70.0) and Emphysema (ICD10-J43.9). Electronically Signed   By: Marijo Sanes M.D.   On: 03/26/2021 13:15    Procedures Procedures   Cardiac monitoring reviewed/interpreted by myself shows NSR  Medications Ordered in ED Medications  acetaminophen (TYLENOL) tablet 650 mg (650 mg Oral Given 03/26/21 1126)  ketorolac (TORADOL) 15 MG/ML injection 15 mg (15 mg Intravenous Given 03/26/21 1124)  sodium chloride 0.9 % bolus 1,000 mL (1,000 mLs Intravenous New Bag/Given 03/26/21 1124)  iohexol (OMNIPAQUE) 350 MG/ML injection 75 mL (75  mLs Intravenous Contrast Given 03/26/21 1259)    ED Course/ Medical Decision Making/ A&P                           Medical Decision Making Amount and/or Complexity of Data Reviewed Independent Historian: spouse External Data Reviewed: labs, radiology, ECG and  notes. Labs: ordered. Decision-making details documented in ED Course. Radiology: ordered. Decision-making details documented in ED Course. ECG/medicine tests: ordered. Decision-making details documented in ED Course.  Risk OTC drugs. Prescription drug management.    CC: covid 19  This patient complains of above; this involves an extensive number of treatment options and is a complaint that carries with it a high risk of complications and morbidity. Vital signs were reviewed. Serious etiologies considered.  Record review:  Previous records obtained and reviewed   Additional history obtained from spouse  Work up as above, notable for:  Labs & imaging results that were available during my care of the patient were reviewed by me and considered in my medical decision making.  Low risk Wells score, patient has history of lupus and COVID-19.  Will obtain D-dimer.  This was elevated.  CT PE was ordered   I ordered imaging studies which included CXR, CTPE and I independently visualized and interpreted imaging which showed no acute process, stable nodules were noted.  Management: Patient given IV fluids, analgesics.  Reassessment:  Patient does report improvement to her symptoms following intervention.  Symptoms likely secondary to COVID-19.  Will start on paxlovid, advised to hold colchicine while on this and for 5 days after.  Respiratory status is stable.  Tolerating PO, ambulatory w/ steady gait  Work note provided  Supportive care recommendations given for home  The patient improved significantly and was discharged in stable condition. Detailed discussions were had with the patient regarding current findings, and  need for close f/u with PCP or on call doctor. The patient has been instructed to return immediately if the symptoms worsen in any way for re-evaluation. Patient verbalized understanding and is in agreement with current care plan. All questions answered prior to discharge.           This chart was dictated using voice recognition software.  Despite best efforts to proofread,  errors can occur which can change the documentation meaning.         Final Clinical Impression(s) / ED Diagnoses Final diagnoses:  COVID-19    Rx / DC Orders ED Discharge Orders          Ordered    nirmatrelvir/ritonavir EUA (PAXLOVID) 20 x 150 MG & 10 x 100MG  TABS  2 times daily,   Status:  Discontinued        03/26/21 1341    fluticasone (FLONASE) 50 MCG/ACT nasal spray  Daily,   Status:  Discontinued        03/26/21 1341    guaiFENesin (ROBITUSSIN) 100 MG/5ML liquid  Every 4 hours PRN,   Status:  Discontinued        03/26/21 1341    fluticasone (FLONASE) 50 MCG/ACT nasal spray  Daily,   Status:  Discontinued        03/26/21 1352    guaiFENesin (ROBITUSSIN) 100 MG/5ML liquid  Every 4 hours PRN,   Status:  Discontinued        03/26/21 1352    nirmatrelvir/ritonavir EUA (PAXLOVID) 20 x 150 MG & 10 x 100MG  TABS  2 times daily,   Status:  Discontinued        03/26/21 1352    fluticasone (FLONASE) 50 MCG/ACT nasal spray  Daily        03/26/21 1418    guaiFENesin (ROBITUSSIN) 100 MG/5ML liquid  Every 4 hours PRN        03/26/21 1418    nirmatrelvir/ritonavir EUA (PAXLOVID) 20 x 150 MG &  10 x 100MG  TABS  2 times daily        03/26/21 1418              Jeanell Sparrow, DO 03/26/21 1749

## 2021-04-01 ENCOUNTER — Ambulatory Visit: Payer: 59 | Admitting: Physician Assistant

## 2021-04-10 DIAGNOSIS — Z1152 Encounter for screening for COVID-19: Secondary | ICD-10-CM | POA: Diagnosis not present

## 2021-04-10 DIAGNOSIS — R7989 Other specified abnormal findings of blood chemistry: Secondary | ICD-10-CM | POA: Diagnosis not present

## 2021-04-10 DIAGNOSIS — I700 Atherosclerosis of aorta: Secondary | ICD-10-CM | POA: Diagnosis not present

## 2021-04-10 DIAGNOSIS — U071 COVID-19: Secondary | ICD-10-CM | POA: Diagnosis not present

## 2021-04-10 DIAGNOSIS — E785 Hyperlipidemia, unspecified: Secondary | ICD-10-CM | POA: Diagnosis not present

## 2021-04-10 DIAGNOSIS — R509 Fever, unspecified: Secondary | ICD-10-CM | POA: Diagnosis not present

## 2021-04-10 DIAGNOSIS — R0981 Nasal congestion: Secondary | ICD-10-CM | POA: Diagnosis not present

## 2021-04-10 DIAGNOSIS — R0609 Other forms of dyspnea: Secondary | ICD-10-CM | POA: Diagnosis not present

## 2021-04-10 DIAGNOSIS — R5383 Other fatigue: Secondary | ICD-10-CM | POA: Diagnosis not present

## 2021-04-10 DIAGNOSIS — R42 Dizziness and giddiness: Secondary | ICD-10-CM | POA: Diagnosis not present

## 2021-04-10 DIAGNOSIS — J101 Influenza due to other identified influenza virus with other respiratory manifestations: Secondary | ICD-10-CM | POA: Diagnosis not present

## 2021-04-10 DIAGNOSIS — E871 Hypo-osmolality and hyponatremia: Secondary | ICD-10-CM | POA: Diagnosis not present

## 2021-04-11 DIAGNOSIS — E039 Hypothyroidism, unspecified: Secondary | ICD-10-CM | POA: Diagnosis not present

## 2021-04-16 ENCOUNTER — Telehealth: Payer: Self-pay

## 2021-04-16 ENCOUNTER — Other Ambulatory Visit: Payer: Self-pay | Admitting: Rheumatology

## 2021-04-16 NOTE — Telephone Encounter (Signed)
FYI:  I called patient to schedule her follow-up appointment.  Patient states she was diagnosed with Covid on 03/26/21 and then tested positive for the Flu last week.  Patient states she has been out of work and is still recovering from back to back illnesses and just finished Tamiflu.  Patient states once she is well enough to return to work she will call back to schedule an appointment.

## 2021-04-16 NOTE — Telephone Encounter (Signed)
Next Visit: Due February 2023. Message sent to the front to schedule patient.   Last Visit: 01/23/2021  Last Fill: 01/15/2021  DX: Sjogren's syndrome with keratoconjunctivitis sicca   Current Dose per office note 01/23/2021: Imuran 75 mg by mouth daily  Labs: 03/26/2021 WBC 3.7, Lymphs Abs 0.4, Chloride 97, Calcium 10.5  Okay to refill Imuran?

## 2021-04-16 NOTE — Telephone Encounter (Signed)
Please schedule patient for a follow up visit. Patient due February 2023. Thanks!  °

## 2021-04-25 ENCOUNTER — Ambulatory Visit: Payer: 59 | Admitting: Rheumatology

## 2021-05-15 ENCOUNTER — Other Ambulatory Visit: Payer: Self-pay

## 2021-05-15 ENCOUNTER — Encounter: Payer: Self-pay | Admitting: Rheumatology

## 2021-05-15 ENCOUNTER — Ambulatory Visit: Payer: 59 | Admitting: Rheumatology

## 2021-05-15 VITALS — BP 108/72 | HR 75 | Ht 66.0 in | Wt 139.8 lb

## 2021-05-15 DIAGNOSIS — M3501 Sicca syndrome with keratoconjunctivitis: Secondary | ICD-10-CM | POA: Diagnosis not present

## 2021-05-15 DIAGNOSIS — M170 Bilateral primary osteoarthritis of knee: Secondary | ICD-10-CM

## 2021-05-15 DIAGNOSIS — R5383 Other fatigue: Secondary | ICD-10-CM

## 2021-05-15 DIAGNOSIS — M19041 Primary osteoarthritis, right hand: Secondary | ICD-10-CM | POA: Diagnosis not present

## 2021-05-15 DIAGNOSIS — Z8669 Personal history of other diseases of the nervous system and sense organs: Secondary | ICD-10-CM

## 2021-05-15 DIAGNOSIS — Z79899 Other long term (current) drug therapy: Secondary | ICD-10-CM

## 2021-05-15 DIAGNOSIS — R9431 Abnormal electrocardiogram [ECG] [EKG]: Secondary | ICD-10-CM

## 2021-05-15 DIAGNOSIS — J842 Lymphoid interstitial pneumonia: Secondary | ICD-10-CM

## 2021-05-15 DIAGNOSIS — Z8639 Personal history of other endocrine, nutritional and metabolic disease: Secondary | ICD-10-CM

## 2021-05-15 DIAGNOSIS — R252 Cramp and spasm: Secondary | ICD-10-CM

## 2021-05-15 DIAGNOSIS — M797 Fibromyalgia: Secondary | ICD-10-CM | POA: Diagnosis not present

## 2021-05-15 DIAGNOSIS — D702 Other drug-induced agranulocytosis: Secondary | ICD-10-CM

## 2021-05-15 DIAGNOSIS — M25462 Effusion, left knee: Secondary | ICD-10-CM | POA: Diagnosis not present

## 2021-05-15 DIAGNOSIS — M47812 Spondylosis without myelopathy or radiculopathy, cervical region: Secondary | ICD-10-CM | POA: Diagnosis not present

## 2021-05-15 DIAGNOSIS — M19042 Primary osteoarthritis, left hand: Secondary | ICD-10-CM

## 2021-05-15 DIAGNOSIS — M2241 Chondromalacia patellae, right knee: Secondary | ICD-10-CM

## 2021-05-15 DIAGNOSIS — U071 COVID-19: Secondary | ICD-10-CM

## 2021-05-15 DIAGNOSIS — Z8659 Personal history of other mental and behavioral disorders: Secondary | ICD-10-CM

## 2021-05-15 DIAGNOSIS — M1120 Other chondrocalcinosis, unspecified site: Secondary | ICD-10-CM | POA: Diagnosis not present

## 2021-05-15 MED ORDER — TRIAMCINOLONE ACETONIDE 40 MG/ML IJ SUSP
40.0000 mg | INTRAMUSCULAR | Status: AC | PRN
  Administered 2021-05-15: 40 mg via INTRA_ARTICULAR

## 2021-05-15 MED ORDER — LIDOCAINE HCL 1 % IJ SOLN
3.0000 mL | INTRAMUSCULAR | Status: AC | PRN
  Administered 2021-05-15: 3 mL

## 2021-05-15 NOTE — Patient Instructions (Signed)
Standing Labs °We placed an order today for your standing lab work.  ° °Please have your standing labs drawn in April and every 3 months ° °If possible, please have your labs drawn 2 weeks prior to your appointment so that the provider can discuss your results at your appointment. ° °Please note that you may see your imaging and lab results in MyChart before we have reviewed them. °We may be awaiting multiple results to interpret others before contacting you. °Please allow our office up to 72 hours to thoroughly review all of the results before contacting the office for clarification of your results. ° °We have open lab daily: °Monday through Thursday from 1:30-4:30 PM and Friday from 1:30-4:00 PM °at the office of Dr. Kenzlei Runions, [facility].   °Please be advised, all patients with office appointments requiring lab work will take precedent over walk-in lab work.  °If possible, please come for your lab work on Monday and Friday afternoons, as you may experience shorter wait times. °The office is located at [address], [address], [place], [place] [address] °No appointment is necessary.   °Labs are drawn by [facility]. Please bring your co-pay at the time of your lab draw.  You may receive a bill from [facility] for your lab work. ° °Please note if you are on Hydroxychloroquine and and an order has been placed for a Hydroxychloroquine level, you will need to have it drawn 4 hours or more after your last dose. ° °If you wish to have your labs drawn at another location, please call the office 24 hours in advance to send orders. ° °If you have any questions regarding directions or hours of operation,  °please call [phone].   °As a reminder, please drink plenty of water prior to coming for your lab work. Thanks!  °Vaccines °You are taking a medication(s) that can suppress your immune system.  The following immunizations are recommended: °Flu annually °Covid-19  °Td/Tdap (tetanus, diphtheria, pertussis)  every 10 years °Pneumonia (Prevnar 15 then Pneumovax 23 at least 1 year apart.  Alternatively, can take Prevnar 20 without needing additional dose) °Shingrix: 2 doses from 4 weeks to 6 months apart ° °Please check with your PCP to make sure you are up to date.  ° °If you have signs or symptoms of an infection or start antibiotics: °First, call your PCP for workup of your infection. °Hold your medication through the infection, until you complete your antibiotics, and until symptoms resolve if you take the following: °Injectable medication (Actemra, Benlysta, Cimzia, Cosentyx, Enbrel, Humira, Kevzara, Orencia, Remicade, Simponi, Stelara, Taltz, Tremfya) °Methotrexate °Leflunomide (Arava) °Mycophenolate (Cellcept) °Xeljanz, Olumiant, or Rinvoq  °

## 2021-05-15 NOTE — Progress Notes (Signed)
Office Visit Note  Patient: Stefanie ChampagneRenee Jones Rupnow             Date of Birth: April 20, 1960           MRN: [id number]             PCP: Creola Cornusso, John, MD Referring: Creola Cornusso, John, MD Visit Date: 05/15/2021 Occupation: @GUAROCC @  Subjective:  Other (Left knee pain and swelling )   History of Present Illness: Stefanie [name] is a 61 y.o. female history of Sjogren's, osteoarthritis, chondrocalcinosis and fibromyalgia.  She states in January 2023 she developed COVID-19 infection.  She was given Paxlovid.  As her symptoms did not improve she was seen back by Dr. Timothy Lassousso and tested positive for flu.  She lost her mother and in the first week of February and was under a lot of stress.  She is soon after developed another infection with COVID-19.  She has been having increased fatigue since then.  She states that there was a stomach bug going on at her work and she developed diarrhea 5 days ago.  The symptoms have improved now.  For the last 3 to 4 weeks she has been having pain and swelling in her left knee joint.  She states the pain radiates into her hip and her trochanteric area.  None of the other joints are painful.  She has been tolerating Imuran without any side effects.  She has not had any pseudogout flare.  She stopped Imuran when she had COVID-19 infection.   Activities of Daily Living:  Patient reports morning stiffness for 25 minutes.   Patient Reports nocturnal pain.  Difficulty dressing/grooming: Denies Difficulty climbing stairs: Reports Difficulty getting out of chair: Reports Difficulty using hands for taps, buttons, cutlery, and/or writing: Reports  Review of Systems  Constitutional:  Positive for fatigue.  HENT:  Positive for mouth dryness and nose dryness. Negative for mouth sores.   Eyes:  Positive for dryness. Negative for pain and itching.  Respiratory:  Negative for shortness of breath and difficulty breathing.   Cardiovascular:  Positive for chest pain. Negative for  palpitations.  Gastrointestinal:  Negative for blood in stool, constipation and diarrhea.  Endocrine: Negative for increased urination.  Genitourinary:  Positive for difficulty urinating.  Musculoskeletal:  Positive for joint pain, joint pain, joint swelling, myalgias, morning stiffness, muscle tenderness and myalgias.  Skin:  Negative for color change, rash, redness and sensitivity to sunlight.  Allergic/Immunologic: Positive for susceptible to infections.  Neurological:  Positive for weakness. Negative for dizziness, numbness, headaches and memory loss.  Hematological:  Positive for bruising/bleeding tendency.  Psychiatric/Behavioral:  Negative for confusion.    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   Prolonged QT interval 07/28/2016   Primary osteoarthritis of both hands 05/11/2016   Primary osteoarthritis of both knees 05/11/2016   History of hypothyroidism 05/11/2016   History of migraine 05/11/2016   Autoimmune disease (HCC) positive ANA, 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, 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, anticardiolipin antibody and positive rheumatoid factor 04/29/2016   ANA positive 04/29/2016   Rheumatoid factor positive 04/29/2016   Pseudogout 04/29/2016   Chondrocalcinosis 04/29/2016   High risk medication use  04/29/2016   Sjogren's syndrome with keratoconjunctivitis sicca (HCC) 04/29/2016   History of neutropenia 04/29/2016   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   Hyponatremia 12/12/2013   Fever 12/13/2012   Pleuritic chest pain 12/06/2012   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   Anxiety 12/06/2012   ANEMIA-IRON  DEFICIENCY 01/08/2010   GASTRIC DILATION, ACUTE 01/08/2010   NAUSEA ALONE 01/08/2010   ABDOMINAL PAIN, LEFT UPPER QUADRANT 01/08/2010    Past Medical History:  Diagnosis Date   Anemia    Arthritis    Asthma    Diabetes insipidus (HCC)    Fibromyalgia    Lupus (HCC)    Mitral valve prolapse    SLE (systemic lupus erythematosus) (HCC) 12/06/2012   Thyroid disease     Family History  Problem Relation Age of Onset   Atrial fibrillation Mother    Dementia Mother    Diabetes Father    Heart disease Father    Hyperlipidemia Father    Hypertension Father    Stroke Father    Diabetes Sister    Kidney disease Sister        transplant   Heart disease Sister        open heart surgery    Pancreatic disease Sister        transplant    Cancer Maternal Grandmother    Diabetes Paternal Grandfather    Heart disease Paternal Grandfather    Hyperlipidemia Paternal Grandfather    Hypertension Paternal Actor    Healthy Daughter    Healthy Son    Past Surgical History:  Procedure Laterality Date   PITUITARY EXCISION     TONSILLECTOMY     Social History   Social History Narrative   Right handed   Immunization History  Administered Date(s) Administered   Influenza Inj Mdck Quad Pf 01/21/2020   Influenza-Unspecified 09/28/2018   PFIZER(Purple Top)SARS-COV-2 Vaccination 11/17/2019, 12/13/2019     Objective: Vital Signs: BP 108/72 (BP Location: Left Arm, Patient Position: Sitting, Cuff Size: Normal)    Pulse 75    Ht  (1.676 m)    Wt 139 lb 12.8 oz (63.4 kg)    LMP 04/09/2014 (Approximate)    BMI 22.56 kg/m    Physical Exam Vitals and nursing note reviewed.  Constitutional:      Appearance: She is well-developed.  HENT:     Head: Normocephalic and atraumatic.  Eyes:     Conjunctiva/sclera: Conjunctivae normal.  Cardiovascular:     Rate and Rhythm: Normal rate and regular rhythm.     Heart sounds: Normal heart sounds.  Pulmonary:     Effort: Pulmonary effort is  normal.     Breath sounds: Normal breath sounds.  Abdominal:     General: Bowel sounds are normal.     Palpations: Abdomen is soft.  Musculoskeletal:     Cervical back: Normal range of motion.  Lymphadenopathy:     Cervical: No cervical adenopathy.  Skin:    General: Skin is warm and dry.     Capillary Refill: Capillary refill takes less than 2 seconds.  Neurological:     Mental Status: She is alert and oriented to person, place, and time.  Psychiatric:        Behavior: Behavior normal.     Musculoskeletal Exam: C-spine was in good range of motion.  Shoulder joints, elbow joints, wrist joints, MCPs PIPs and DIPs with good range of motion with no synovitis.  Hip joints and knee joints with good range of  motion.  She has warmth swelling and effusion in the left knee joint.  There is no tenderness over ankles or MTPs.  CDAI Exam: CDAI Score: -- Patient Global: --; Provider Global: -- Swollen: --; Tender: -- Joint Exam 05/15/2021   No joint exam has been documented for this visit   There is currently no information documented on the homunculus. Go to the Rheumatology activity and complete the homunculus joint exam.  Investigation: No additional findings.  Imaging: No results found.  Recent Labs: Lab Results  Component Value Date   WBC 3.7 (L) 03/26/2021   HGB 13.7 03/26/2021   PLT 201 03/26/2021   NA 136 03/26/2021   K 3.8 03/26/2021   CL 97 (L) 03/26/2021   CO2 29 03/26/2021   GLUCOSE 70 03/26/2021   BUN 13 03/26/2021   CREATININE 0.89 03/26/2021   BILITOT 0.7 01/23/2021   ALKPHOS 82 05/15/2020   AST 28 01/23/2021   ALT 17 01/23/2021   PROT 8.3 (H) 01/23/2021   PROT 8.0 01/23/2021   ALBUMIN 4.1 05/15/2020   CALCIUM 10.5 (H) 03/26/2021   GFRAA 102 04/24/2020   QFTBGOLDPLUS NEGATIVE 10/16/2019    Speciality Comments: PLQ Eye Exam 02/26/2020 WNL @ [facility] Ophthalmology follow up in 1 year  Procedures:  Large Joint Inj on 05/15/2021 3:47 PM Indications:  pain Details: 27 G 1.5 in needle, medial approach  Arthrogram: No  Medications: 40 mg triamcinolone acetonide 40 MG/ML; 3 mL lidocaine 1 % Aspirate: 0 mL Outcome: tolerated well, no immediate complications Procedure, treatment alternatives, risks and benefits explained, specific risks discussed. Consent was given by the patient. Immediately prior to procedure a time out was called to verify the correct patient, procedure, equipment, support staff and site/side marked as required. Patient was prepped and draped in the usual sterile fashion.    Allergies: Iron sucrose, Morphine, Venofer [ferric oxide], Azithromycin, and Other   Assessment / Plan:     Visit Diagnoses: Sjogren's syndrome with keratoconjunctivitis sicca (HCC) - Positive ANA, positive Ro, positive La, positive RF sicca symptoms, inflammatory arthritis: She continues to have dry mouth and dry eyes.  She has been under a lot of stress recently.  She had to miss Imuran when she developed COVID-19 infection.  She presents today with left knee joint pain and swelling.  High risk medication use - Imuran 75 mg by mouth daily.  Labs obtained on March 26, 2021 showed white cell count 3.7, hemoglobin 13.7 and platelets were normal.  Calcium was mildly elevated.  She was advised to hold calcium supplement.  We will continue to monitor labs every 3 months.  Information on immunization was placed in the AVS. I will check autoimmune labs with her next labs in April.  Other drug-induced neutropenia (HCC) - Most likely due to medication use.  She is also on Imuran 75 mg daily.  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   Primary osteoarthritis of both hands-she DIP and PIP thickening with no synovitis.  Primary osteoarthritis of both knees-she complains of pain and discomfort in her bilateral knee joints.   X-rays of the left knee joint from January 23, 2021 were reviewed which showed moderate to severe lateral compartment narrowing and moderate chondromalacia patella.  Effusion, left knee -she had warmth swelling and small effusion in her left knee joint.  After informed consent was obtained left knee joint was aspirated but no  synovial fluid was aspirated.  Knee joint was injected with 3 mL of 1% lidocaine and 40 mg of Kenalog.  Patient tolerated the procedure well.  Postprocedure instructions were given.  I discussed that if she has instability and ongoing problems she will need to total knee replacement in the future.  Chondromalacia patellae, right knee-chronic pain.  Chondrocalcinosis -patient denies having episodes of pseudogout since she has been on colchicine.  Colchicine 0.6 mg 1 capsule by mouth daily.   Cervical spondylosis without myelopathy-she continues to have some stiffness.  Fibromyalgia-she has generalized pain and discomfort from fibromyalgia.  Other fatigue-she has been experiencing increased fatigue.  Other medical problems are listed as follows:  Muscle cramping  History of migraine  Prolonged QT interval - She is not a good candidate for the use of PLQ.   History of hypothyroidism  History of diabetes insipidus  History of anxiety  Orders: Orders Placed This Encounter  Procedures   Large Joint Inj   COMPLETE METABOLIC PANEL WITH GFR   Protein / creatinine ratio, urine   C3 and C4   Anti-DNA antibody, double-stranded   Sedimentation rate   No orders of the defined types were placed in this encounter.   Follow-Up Instructions: Return in about 3 months (around 08/15/2021) for Sjogren's, Osteoarthritis.   Pollyann Savoy, MD  Note - This record has been created using Animal nutritionist.  Chart creation errors have been sought, but may not always  have been located. Such creation errors do not reflect on  the standard of medical care.

## 2021-07-18 DIAGNOSIS — H2513 Age-related nuclear cataract, bilateral: Secondary | ICD-10-CM | POA: Diagnosis not present

## 2021-07-18 DIAGNOSIS — H52203 Unspecified astigmatism, bilateral: Secondary | ICD-10-CM | POA: Diagnosis not present

## 2021-07-18 DIAGNOSIS — H04123 Dry eye syndrome of bilateral lacrimal glands: Secondary | ICD-10-CM | POA: Diagnosis not present

## 2021-07-25 DIAGNOSIS — E785 Hyperlipidemia, unspecified: Secondary | ICD-10-CM | POA: Diagnosis not present

## 2021-07-25 DIAGNOSIS — F419 Anxiety disorder, unspecified: Secondary | ICD-10-CM | POA: Diagnosis not present

## 2021-07-25 DIAGNOSIS — E559 Vitamin D deficiency, unspecified: Secondary | ICD-10-CM | POA: Diagnosis not present

## 2021-07-25 DIAGNOSIS — E039 Hypothyroidism, unspecified: Secondary | ICD-10-CM | POA: Diagnosis not present

## 2021-08-01 DIAGNOSIS — R82998 Other abnormal findings in urine: Secondary | ICD-10-CM | POA: Diagnosis not present

## 2021-08-01 DIAGNOSIS — Z0000 Encounter for general adult medical examination without abnormal findings: Secondary | ICD-10-CM | POA: Diagnosis not present

## 2021-08-01 DIAGNOSIS — M3210 Systemic lupus erythematosus, organ or system involvement unspecified: Secondary | ICD-10-CM | POA: Diagnosis not present

## 2021-08-01 DIAGNOSIS — M3501 Sicca syndrome with keratoconjunctivitis: Secondary | ICD-10-CM | POA: Diagnosis not present

## 2021-08-01 DIAGNOSIS — D899 Disorder involving the immune mechanism, unspecified: Secondary | ICD-10-CM | POA: Diagnosis not present

## 2021-08-01 DIAGNOSIS — E785 Hyperlipidemia, unspecified: Secondary | ICD-10-CM | POA: Diagnosis not present

## 2021-08-01 DIAGNOSIS — M1120 Other chondrocalcinosis, unspecified site: Secondary | ICD-10-CM | POA: Diagnosis not present

## 2021-08-01 DIAGNOSIS — E039 Hypothyroidism, unspecified: Secondary | ICD-10-CM | POA: Diagnosis not present

## 2021-08-01 DIAGNOSIS — I341 Nonrheumatic mitral (valve) prolapse: Secondary | ICD-10-CM | POA: Diagnosis not present

## 2021-08-01 DIAGNOSIS — D72819 Decreased white blood cell count, unspecified: Secondary | ICD-10-CM | POA: Diagnosis not present

## 2021-08-01 DIAGNOSIS — E559 Vitamin D deficiency, unspecified: Secondary | ICD-10-CM | POA: Diagnosis not present

## 2021-08-01 DIAGNOSIS — M8580 Other specified disorders of bone density and structure, unspecified site: Secondary | ICD-10-CM | POA: Diagnosis not present

## 2021-08-01 DIAGNOSIS — E871 Hypo-osmolality and hyponatremia: Secondary | ICD-10-CM | POA: Diagnosis not present

## 2021-08-06 NOTE — Progress Notes (Deleted)
Office Visit Note  Patient: Stefanie [name]             Date of Birth: March 05, 1961           MRN: [id number]             PCP: Shon Baton, MD Referring: Shon Baton, MD Visit Date: 08/20/2021 Occupation: @GUAROCC @  Subjective:  No chief complaint on file.   History of Present Illness: Stefanie [name] is a 61 y.o. female ***   Activities of Daily Living:  Patient reports morning stiffness for *** {minute/hour:19697}.   Patient {ACTIONS;DENIES/REPORTS:21021675::"Denies"} nocturnal pain.  Difficulty dressing/grooming: {ACTIONS;DENIES/REPORTS:21021675::"Denies"} Difficulty climbing stairs: {ACTIONS;DENIES/REPORTS:21021675::"Denies"} Difficulty getting out of chair: {ACTIONS;DENIES/REPORTS:21021675::"Denies"} Difficulty using hands for taps, buttons, cutlery, and/or writing: {ACTIONS;DENIES/REPORTS:21021675::"Denies"}  No Rheumatology ROS completed.   PMFS History:  Patient Active Problem List   Diagnosis Date Noted  . Lymphoid interstitial pneumonia ([place]) 10/06/2019  . Gasping for breath 05/10/2018  . Bursitis, ischial, right 10/08/2016  . 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 Fibromyalgia 10/08/2016  . Other fatigue 10/08/2016  . DDD (degenerative disc disease), lumbar/ and scoliosis 10/08/2016  . Facial droop   . 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 Prolonged QT interval 07/28/2016  . Primary osteoarthritis of both hands 05/11/2016  . Primary osteoarthritis of both knees 05/11/2016  . History of hypothyroidism 05/11/2016  . History of migraine 05/11/2016  . 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 ANA positive 04/29/2016  . Rheumatoid factor positive 04/29/2016  . Pseudogout 04/29/2016  . Chondrocalcinosis 04/29/2016  . High risk medication use 04/29/2016  . Sjogren's syndrome with keratoconjunctivitis sicca ([place]) 04/29/2016  . History of  neutropenia 04/29/2016  . 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  . Hyponatremia 12/12/2013  . Fever 12/13/2012  . Pleuritic chest pain 12/06/2012  . DI (diabetes insipidus) ([place]) 12/06/2012  . Anxiety 12/06/2012  . ANEMIA-IRON DEFICIENCY 01/08/2010  . GASTRIC DILATION, ACUTE 01/08/2010  . NAUSEA ALONE 01/08/2010  . ABDOMINAL PAIN, LEFT UPPER QUADRANT 01/08/2010    Past Medical History:  Diagnosis Date  . Anemia   . Arthritis   . Asthma   . Diabetes insipidus ([place])   . Fibromyalgia   . Lupus ([place])   . Mitral valve prolapse   . SLE (systemic lupus erythematosus) ([place]) 12/06/2012  . Thyroid disease     Family History  Problem Relation Age of Onset  . Atrial fibrillation Mother   . Dementia Mother   . Diabetes Father   . Heart disease Father   . Hyperlipidemia Father   . Hypertension Father   . Stroke Father   . Diabetes Sister   . Kidney disease Sister        transplant  . Heart disease Sister        open heart surgery   . Pancreatic disease Sister        transplant   . Cancer Maternal Grandmother   . Diabetes Paternal Grandfather   . Heart disease Paternal Grandfather   . Hyperlipidemia Paternal Grandfather   . Hypertension Paternal Grandfather   . Healthy Daughter   . Healthy Son    Past Surgical History:  Procedure Laterality Date  . PITUITARY EXCISION    . TONSILLECTOMY     Social History   Social History Narrative   Right handed   Immunization History  Administered Date(s) Administered  . Influenza Inj Mdck Quad Pf 01/21/2020  . Influenza-Unspecified 09/28/2018  .  PFIZER(Purple Top)SARS-COV-2 Vaccination 11/17/2019, 12/13/2019     Objective: Vital Signs: LMP 04/09/2014 (Approximate)    Physical Exam   Musculoskeletal Exam: ***  CDAI Exam: CDAI Score: -- Patient Global: --; Provider Global: -- Swollen: --; Tender: -- Joint Exam 08/20/2021   No joint exam has been documented for this visit   There is currently no information documented  on the homunculus. Go to the Rheumatology activity and complete the homunculus joint exam.  Investigation: No additional findings.  Imaging: No results found.  Recent Labs: Lab Results  Component Value Date   WBC 3.7 (L) 03/26/2021   HGB 13.7 03/26/2021   PLT 201 03/26/2021   NA 136 03/26/2021   K 3.8 03/26/2021   CL 97 (L) 03/26/2021   CO2 29 03/26/2021   GLUCOSE 70 03/26/2021   BUN 13 03/26/2021   CREATININE 0.89 03/26/2021   BILITOT 0.7 01/23/2021   ALKPHOS 82 05/15/2020   AST 28 01/23/2021   ALT 17 01/23/2021   PROT 8.3 (H) 01/23/2021   PROT 8.0 01/23/2021   ALBUMIN 4.1 05/15/2020   CALCIUM 10.5 (H) 03/26/2021   GFRAA 102 04/24/2020   QFTBGOLDPLUS NEGATIVE 10/16/2019    Speciality Comments: PLQ Eye Exam 02/26/2020 WNL @ [facility] Ophthalmology follow up in 1 year  Procedures:  No procedures performed Allergies: Iron sucrose, Morphine, Venofer [ferric oxide], Azithromycin, and Other   Assessment / Plan:     Visit Diagnoses: Sjogren's syndrome with keratoconjunctivitis sicca (HCC)  High risk medication use  Other drug-induced neutropenia (HCC)  Lymphoid interstitial pneumonia ([place])  Primary osteoarthritis of both hands  Primary osteoarthritis of both knees  Effusion, left knee  Chondromalacia patellae, right knee  Chondrocalcinosis  Cervical spondylosis without myelopathy  Fibromyalgia  Other fatigue  Muscle cramping  History of migraine  Prolonged QT interval  History of hypothyroidism  History of diabetes insipidus  History of anxiety  Orders: No orders of the defined types were placed in this encounter.  No orders of the defined types were placed in this encounter.   Face-to-face time spent with patient was *** minutes. Greater than 50% of time was spent in counseling and coordination of care.  Follow-Up Instructions: No follow-ups on file.   Ofilia Neas, PA-C  Note - This record has been created using Dragon software.   Chart creation errors have been sought, but may not always  have been located. Such creation errors do not reflect on  the standard of medical care.

## 2021-08-20 ENCOUNTER — Ambulatory Visit: Payer: 59 | Admitting: Physician Assistant

## 2021-08-20 DIAGNOSIS — M797 Fibromyalgia: Secondary | ICD-10-CM

## 2021-08-20 DIAGNOSIS — Z8639 Personal history of other endocrine, nutritional and metabolic disease: Secondary | ICD-10-CM

## 2021-08-20 DIAGNOSIS — M1120 Other chondrocalcinosis, unspecified site: Secondary | ICD-10-CM

## 2021-08-20 DIAGNOSIS — M2241 Chondromalacia patellae, right knee: Secondary | ICD-10-CM

## 2021-08-20 DIAGNOSIS — M47812 Spondylosis without myelopathy or radiculopathy, cervical region: Secondary | ICD-10-CM

## 2021-08-20 DIAGNOSIS — M19041 Primary osteoarthritis, right hand: Secondary | ICD-10-CM

## 2021-08-20 DIAGNOSIS — R9431 Abnormal electrocardiogram [ECG] [EKG]: Secondary | ICD-10-CM

## 2021-08-20 DIAGNOSIS — R252 Cramp and spasm: Secondary | ICD-10-CM

## 2021-08-20 DIAGNOSIS — D702 Other drug-induced agranulocytosis: Secondary | ICD-10-CM

## 2021-08-20 DIAGNOSIS — J842 Lymphoid interstitial pneumonia: Secondary | ICD-10-CM

## 2021-08-20 DIAGNOSIS — Z8659 Personal history of other mental and behavioral disorders: Secondary | ICD-10-CM

## 2021-08-20 DIAGNOSIS — Z8669 Personal history of other diseases of the nervous system and sense organs: Secondary | ICD-10-CM

## 2021-08-20 DIAGNOSIS — Z79899 Other long term (current) drug therapy: Secondary | ICD-10-CM

## 2021-08-20 DIAGNOSIS — M170 Bilateral primary osteoarthritis of knee: Secondary | ICD-10-CM

## 2021-08-20 DIAGNOSIS — R5383 Other fatigue: Secondary | ICD-10-CM

## 2021-08-20 DIAGNOSIS — M25462 Effusion, left knee: Secondary | ICD-10-CM

## 2021-08-20 DIAGNOSIS — M3501 Sicca syndrome with keratoconjunctivitis: Secondary | ICD-10-CM

## 2021-08-22 NOTE — Progress Notes (Unsigned)
Office Visit Note  Patient: Stefanie [name]             Date of Birth: 09/20/1960           MRN: [id number]             PCP: Creola Corn, MD Referring: Creola Corn, MD Visit Date: 09/01/2021 Occupation: @GUAROCC @  Subjective:  No chief complaint on file.   History of Present Illness: Stefanie [name] is a 61 y.o. female ***   Activities of Daily Living:  Patient reports morning stiffness for *** {minute/hour:19697}.   Patient {ACTIONS;DENIES/REPORTS:21021675::"Denies"} nocturnal pain.  Difficulty dressing/grooming: {ACTIONS;DENIES/REPORTS:21021675::"Denies"} Difficulty climbing stairs: {ACTIONS;DENIES/REPORTS:21021675::"Denies"} Difficulty getting out of chair: {ACTIONS;DENIES/REPORTS:21021675::"Denies"} Difficulty using hands for taps, buttons, cutlery, and/or writing: {ACTIONS;DENIES/REPORTS:21021675::"Denies"}  No Rheumatology ROS completed.   PMFS History:  Patient Active Problem List   Diagnosis Date Noted  . 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 Gasping for breath 05/10/2018  . Bursitis, ischial, right 10/08/2016  . 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 Fibromyalgia 10/08/2016  . Other fatigue 10/08/2016  . DDD (degenerative disc disease), lumbar/ and scoliosis 10/08/2016  . Facial droop   . 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 Prolonged QT interval 07/28/2016  . Primary osteoarthritis of both hands 05/11/2016  . Primary osteoarthritis of both knees 05/11/2016  . History of hypothyroidism 05/11/2016  . History of migraine 05/11/2016  . 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 ANA positive 04/29/2016  . Rheumatoid factor positive 04/29/2016  . Pseudogout 04/29/2016  . Chondrocalcinosis 04/29/2016  . High risk medication use 04/29/2016  . Sjogren's syndrome with keratoconjunctivitis sicca (HCC) 04/29/2016  . History of  neutropenia 04/29/2016  . 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  . Hyponatremia 12/12/2013  . Fever 12/13/2012  . Pleuritic chest pain 12/06/2012  . 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 Anxiety 12/06/2012  . ANEMIA-IRON DEFICIENCY 01/08/2010  . GASTRIC DILATION, ACUTE 01/08/2010  . NAUSEA ALONE 01/08/2010  . ABDOMINAL PAIN, LEFT UPPER QUADRANT 01/08/2010    Past Medical History:  Diagnosis Date  . Anemia   . Arthritis   . Asthma   . Diabetes insipidus (HCC)   . Fibromyalgia   . Lupus (HCC)   . Mitral valve prolapse   . SLE (systemic lupus erythematosus) (HCC) 12/06/2012  . Thyroid disease     Family History  Problem Relation Age of Onset  . Atrial fibrillation Mother   . Dementia Mother   . Diabetes Father   . Heart disease Father   . Hyperlipidemia Father   . Hypertension Father   . Stroke Father   . Diabetes Sister   . Kidney disease Sister        transplant  . Heart disease Sister        open heart surgery   . Pancreatic disease Sister        transplant   . Cancer Maternal Grandmother   . Diabetes Paternal Grandfather   . Heart disease Paternal Grandfather   . Hyperlipidemia Paternal Grandfather   . Hypertension Paternal Grandfather   . Healthy Daughter   . Healthy Son    Past Surgical History:  Procedure Laterality Date  . PITUITARY EXCISION    . TONSILLECTOMY     Social History   Social History Narrative   Right handed   Immunization History  Administered Date(s) Administered  . Influenza Inj Mdck Quad Pf 01/21/2020  . Influenza-Unspecified 09/28/2018  .  PFIZER(Purple Top)SARS-COV-2 Vaccination 11/17/2019, 12/13/2019     Objective: Vital Signs: LMP 04/09/2014 (Approximate)    Physical Exam   Musculoskeletal Exam: ***  CDAI Exam: CDAI Score: -- Patient Global: --; Provider Global: -- Swollen: --; Tender: -- Joint Exam 09/01/2021   No joint exam has been documented for this visit   There is currently no information documented  on the homunculus. Go to the Rheumatology activity and complete the homunculus joint exam.  Investigation: No additional findings.  Imaging: No results found.  Recent Labs: Lab Results  Component Value Date   WBC 3.7 (L) 03/26/2021   HGB 13.7 03/26/2021   PLT 201 03/26/2021   NA 136 03/26/2021   K 3.8 03/26/2021   CL 97 (L) 03/26/2021   CO2 29 03/26/2021   GLUCOSE 70 03/26/2021   BUN 13 03/26/2021   CREATININE 0.89 03/26/2021   BILITOT 0.7 01/23/2021   ALKPHOS 82 05/15/2020   AST 28 01/23/2021   ALT 17 01/23/2021   PROT 8.3 (H) 01/23/2021   PROT 8.0 01/23/2021   ALBUMIN 4.1 05/15/2020   CALCIUM 10.5 (H) 03/26/2021   GFRAA 102 04/24/2020   QFTBGOLDPLUS NEGATIVE 10/16/2019    Speciality Comments: PLQ Eye Exam 02/26/2020 WNL @ [facility] Ophthalmology follow up in 1 year  Procedures:  No procedures performed Allergies: Iron sucrose, Morphine, Venofer [ferric oxide], Azithromycin, and Other   Assessment / Plan:     Visit Diagnoses: Sjogren's syndrome with keratoconjunctivitis sicca (HCC)  High risk medication use  Other drug-induced neutropenia (HCC)  Lymphoid interstitial pneumonia (HCC)  Primary osteoarthritis of both hands  Primary osteoarthritis of both knees  Effusion, left knee  Chondromalacia patellae, right knee  Chondrocalcinosis  Cervical spondylosis without myelopathy  Fibromyalgia  Other fatigue  Muscle cramping  History of migraine  Prolonged QT interval  History of hypothyroidism  History of diabetes insipidus  History of anxiety  Orders: No orders of the defined types were placed in this encounter.  No orders of the defined types were placed in this encounter.   Face-to-face time spent with patient was *** minutes. Greater than 50% of time was spent in counseling and coordination of care.  Follow-Up Instructions: No follow-ups on file.   Gearldine Bienenstock, PA-C  Note - This record has been created using Dragon software.   Chart creation errors have been sought, but may not always  have been located. Such creation errors do not reflect on  the standard of medical care.

## 2021-09-01 ENCOUNTER — Ambulatory Visit: Payer: 59 | Admitting: Physician Assistant

## 2021-09-01 ENCOUNTER — Other Ambulatory Visit: Payer: Self-pay | Admitting: *Deleted

## 2021-09-01 ENCOUNTER — Encounter: Payer: Self-pay | Admitting: Physician Assistant

## 2021-09-01 VITALS — BP 114/77 | HR 74 | Ht 66.0 in | Wt 145.0 lb

## 2021-09-01 DIAGNOSIS — M25462 Effusion, left knee: Secondary | ICD-10-CM

## 2021-09-01 DIAGNOSIS — M3501 Sicca syndrome with keratoconjunctivitis: Secondary | ICD-10-CM | POA: Diagnosis not present

## 2021-09-01 DIAGNOSIS — M19042 Primary osteoarthritis, left hand: Secondary | ICD-10-CM

## 2021-09-01 DIAGNOSIS — M797 Fibromyalgia: Secondary | ICD-10-CM

## 2021-09-01 DIAGNOSIS — M47812 Spondylosis without myelopathy or radiculopathy, cervical region: Secondary | ICD-10-CM

## 2021-09-01 DIAGNOSIS — M170 Bilateral primary osteoarthritis of knee: Secondary | ICD-10-CM | POA: Diagnosis not present

## 2021-09-01 DIAGNOSIS — Z79899 Other long term (current) drug therapy: Secondary | ICD-10-CM | POA: Diagnosis not present

## 2021-09-01 DIAGNOSIS — R5383 Other fatigue: Secondary | ICD-10-CM | POA: Diagnosis not present

## 2021-09-01 DIAGNOSIS — M19041 Primary osteoarthritis, right hand: Secondary | ICD-10-CM

## 2021-09-01 DIAGNOSIS — J842 Lymphoid interstitial pneumonia: Secondary | ICD-10-CM

## 2021-09-01 DIAGNOSIS — Z8659 Personal history of other mental and behavioral disorders: Secondary | ICD-10-CM

## 2021-09-01 DIAGNOSIS — Z8639 Personal history of other endocrine, nutritional and metabolic disease: Secondary | ICD-10-CM

## 2021-09-01 DIAGNOSIS — D702 Other drug-induced agranulocytosis: Secondary | ICD-10-CM | POA: Diagnosis not present

## 2021-09-01 DIAGNOSIS — M359 Systemic involvement of connective tissue, unspecified: Secondary | ICD-10-CM

## 2021-09-01 DIAGNOSIS — R9431 Abnormal electrocardiogram [ECG] [EKG]: Secondary | ICD-10-CM

## 2021-09-01 DIAGNOSIS — Z8669 Personal history of other diseases of the nervous system and sense organs: Secondary | ICD-10-CM

## 2021-09-01 DIAGNOSIS — M1120 Other chondrocalcinosis, unspecified site: Secondary | ICD-10-CM | POA: Diagnosis not present

## 2021-09-01 DIAGNOSIS — M2241 Chondromalacia patellae, right knee: Secondary | ICD-10-CM | POA: Diagnosis not present

## 2021-09-01 DIAGNOSIS — R252 Cramp and spasm: Secondary | ICD-10-CM

## 2021-09-03 LAB — COMPLETE METABOLIC PANEL WITH GFR
AG Ratio: 1.4 (calc) (ref 1.0–2.5)
ALT: 16 U/L (ref 6–29)
AST: 27 U/L (ref 10–35)
Albumin: 4.3 g/dL (ref 3.6–5.1)
Alkaline phosphatase (APISO): 86 U/L (ref 37–153)
BUN: 18 mg/dL (ref 7–25)
CO2: 30 mmol/L (ref 20–32)
Calcium: 9.4 mg/dL (ref 8.6–10.4)
Chloride: 102 mmol/L (ref 98–110)
Creat: 0.62 mg/dL (ref 0.50–1.05)
Globulin: 3.1 g/dL (calc) (ref 1.9–3.7)
Glucose, Bld: 90 mg/dL (ref 65–99)
Potassium: 4.1 mmol/L (ref 3.5–5.3)
Sodium: 137 mmol/L (ref 135–146)
Total Bilirubin: 0.6 mg/dL (ref 0.2–1.2)
Total Protein: 7.4 g/dL (ref 6.1–8.1)
eGFR: 101 mL/min/{1.73_m2} (ref >=60)

## 2021-09-03 LAB — CBC WITH DIFFERENTIAL/PLATELET
Absolute Monocytes: 400 cells/uL (ref 200–950)
Basophils Absolute: 20 cells/uL (ref 0–200)
Basophils Relative: 0.7 %
Eosinophils Absolute: 49 cells/uL (ref 15–500)
Eosinophils Relative: 1.7 %
HCT: 37.8 % (ref 35.0–45.0)
Hemoglobin: 12.7 g/dL (ref 11.7–15.5)
Lymphs Abs: 499 cells/uL — ABNORMAL LOW (ref 850–3900)
MCH: 29.7 pg (ref 27.0–33.0)
MCHC: 33.6 g/dL (ref 32.0–36.0)
MCV: 88.3 fL (ref 80.0–100.0)
MPV: 11.1 fL (ref 7.5–12.5)
Monocytes Relative: 13.8 %
Neutro Abs: 1931 cells/uL (ref 1500–7800)
Neutrophils Relative %: 66.6 %
Platelets: 213 10*3/uL (ref 140–400)
RBC: 4.28 10*6/uL (ref 3.80–5.10)
RDW: 12.5 % (ref 11.0–15.0)
Total Lymphocyte: 17.2 %
WBC: 2.9 10*3/uL — ABNORMAL LOW (ref 3.8–10.8)

## 2021-09-03 LAB — ANTI-NUCLEAR AB-TITER (ANA TITER): ANA Titer 1: 1:640 {titer} — ABNORMAL HIGH

## 2021-09-03 LAB — URINALYSIS, ROUTINE W REFLEX MICROSCOPIC
Bilirubin Urine: NEGATIVE
Glucose, UA: NEGATIVE
Hgb urine dipstick: NEGATIVE
Ketones, ur: NEGATIVE
Leukocytes,Ua: NEGATIVE
Nitrite: NEGATIVE
Protein, ur: NEGATIVE
Specific Gravity, Urine: 1.011 (ref 1.001–1.035)
pH: 6.5 (ref 5.0–8.0)

## 2021-09-03 LAB — RHEUMATOID FACTOR: Rheumatoid fact SerPl-aCnc: <14 IU/mL (ref <14)

## 2021-09-03 LAB — C3 AND C4
C3 Complement: 140 mg/dL (ref 83–193)
C4 Complement: 31 mg/dL (ref 15–57)

## 2021-09-03 LAB — PROTEIN ELECTROPHORESIS, SERUM, WITH REFLEX
Albumin ELP: 4.1 g/dL (ref 3.8–4.8)
Alpha 1: 0.3 g/dL (ref 0.2–0.3)
Alpha 2: 0.8 g/dL (ref 0.5–0.9)
Beta 2: 0.4 g/dL (ref 0.2–0.5)
Beta Globulin: 0.5 g/dL (ref 0.4–0.6)
Gamma Globulin: 1.4 g/dL (ref 0.8–1.7)
Total Protein: 7.4 g/dL (ref 6.1–8.1)

## 2021-09-03 LAB — SJOGRENS SYNDROME-A EXTRACTABLE NUCLEAR ANTIBODY: SSA (Ro) (ENA) Antibody, IgG: >8 AI — AB

## 2021-09-03 LAB — SJOGRENS SYNDROME-B EXTRACTABLE NUCLEAR ANTIBODY: SSB (La) (ENA) Antibody, IgG: <1 AI

## 2021-09-03 LAB — ANA: Anti Nuclear Antibody (ANA): POSITIVE — AB

## 2021-09-03 LAB — ANTI-DNA ANTIBODY, DOUBLE-STRANDED: ds DNA Ab: 1 IU/mL

## 2021-09-03 LAB — SEDIMENTATION RATE: Sed Rate: 29 mm/h (ref 0–30)

## 2021-09-03 NOTE — Progress Notes (Signed)
White blood cell count is low at 2.9.  Absolute lymphocyte count remains low.  Please advise patient to return to have updated CBC with differential in 1 month.  CMP within normal limits.  Ro antibody remains positive.  La antibody negative, RF negative.  UA normal, double-stranded DNA negative, ESR within normal limits.  SPEP did not reveal any abnormal proteins.

## 2021-09-04 ENCOUNTER — Other Ambulatory Visit: Payer: Self-pay | Admitting: *Deleted

## 2021-09-04 ENCOUNTER — Ambulatory Visit: Payer: 59

## 2021-09-04 DIAGNOSIS — M170 Bilateral primary osteoarthritis of knee: Secondary | ICD-10-CM

## 2021-09-04 DIAGNOSIS — M359 Systemic involvement of connective tissue, unspecified: Secondary | ICD-10-CM

## 2021-09-04 DIAGNOSIS — M3501 Sicca syndrome with keratoconjunctivitis: Secondary | ICD-10-CM

## 2021-09-04 DIAGNOSIS — Z79899 Other long term (current) drug therapy: Secondary | ICD-10-CM

## 2021-09-04 DIAGNOSIS — I340 Nonrheumatic mitral (valve) insufficiency: Secondary | ICD-10-CM

## 2021-09-04 DIAGNOSIS — J842 Lymphoid interstitial pneumonia: Secondary | ICD-10-CM

## 2021-09-04 DIAGNOSIS — D702 Other drug-induced agranulocytosis: Secondary | ICD-10-CM

## 2021-09-04 DIAGNOSIS — M25462 Effusion, left knee: Secondary | ICD-10-CM

## 2021-09-04 NOTE — Progress Notes (Signed)
ANA remains positive.  Complements WNL.

## 2021-09-05 ENCOUNTER — Other Ambulatory Visit: Payer: 59

## 2021-09-11 ENCOUNTER — Ambulatory Visit: Payer: 59 | Admitting: Cardiology

## 2021-09-11 ENCOUNTER — Encounter: Payer: Self-pay | Admitting: Cardiology

## 2021-09-11 VITALS — BP 110/68 | HR 72 | Temp 97.9°F | Resp 16 | Ht 66.0 in | Wt 144.2 lb

## 2021-09-11 DIAGNOSIS — I361 Nonrheumatic tricuspid (valve) insufficiency: Secondary | ICD-10-CM | POA: Diagnosis not present

## 2021-09-11 DIAGNOSIS — R0609 Other forms of dyspnea: Secondary | ICD-10-CM

## 2021-09-11 DIAGNOSIS — I340 Nonrheumatic mitral (valve) insufficiency: Secondary | ICD-10-CM

## 2021-09-11 NOTE — Progress Notes (Signed)
Stefanie [name] Date of Birth: 08/04/1960 MRN: [id number] Primary Care Provider:Russo, Jenny Reichmann, MD Former Cardiology Providers: Jeri Lager, APRN, FNP-C Primary Cardiologist: Rex Kras, DO, [facility] (established care 11/15/2019)  Date: 09/11/21 Last Office Visit: 11/01/2020  Chief Complaint  Patient presents with   Shortness of Breath   Follow-up    HPI  Stefanie [name] is a 62 y.o.  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  Patient presents today for a 1 year follow-up visit for dyspnea.  Since last office visit, overall doing well from a cardiovascular standpoint.  Intermittently has palpitations and nonspecific precordial discomfort which last less than a minute.  The precordial discomfort is nonexertional, not substernal and does not improve with resting is usually self-limited.  Since last office visit she underwent an echocardiogram which notes preserved LVEF, improvement in mitral regurgitation, and no significant RVSP to suggest pulmonary hypertension.  Patient has rheumatoid arthritis with positive ANA and rheumatoid factor, keratoconjunctivitis silica, interstitial pneumonitis which is currently being followed by rheumatology and pulmonary medicine.  Denies any hospitalizations or urgent care visits for cardiovascular symptoms.  ALLERGIES: Allergies  Allergen Reactions   Iron Sucrose Rash    Chest pain   Morphine     Other reaction(s): Vomiting   Venofer [Ferric Oxide] Palpitations    Dizziness, sweating, "felt like I was going to pass out" unknown when episode happened, itching   Azithromycin Other (See Comments)    "couldnt sleep"   Other     Other reaction(s): Other (See Comments) Magic mouth wash. Caused tongue to have a burning feeling      MEDICATION LIST PRIOR TO VISIT: Current Outpatient Medications on File Prior to Visit  Medication Sig Dispense Refill   albuterol (PROVENTIL HFA;VENTOLIN HFA) 108 (90 BASE) MCG/ACT inhaler Inhale 1 puff into the lungs every 6 (six) hours as needed for wheezing or shortness of breath.     azaTHIOprine (IMURAN) 50 MG tablet TAKE 1 AND 1/2 TABLETS BY MOUTH DAILY 135 tablet 0   levothyroxine (SYNTHROID, LEVOTHROID) 125 MCG tablet Take 125 mcg by mouth at bedtime.      MITIGARE 0.6 MG CAPS TAKE 1 CAPSULE BY MOUTH EVERY DAY 90 capsule 0   Polyvinyl Alcohol-Povidone (REFRESH OP) Apply to eye as needed.     rizatriptan (MAXALT) 10 MG tablet Take 10 mg by mouth daily as needed for migraine. May repeat in 2 hours if needed     No current facility-administered medications on file prior to visit.    PAST MEDICAL HISTORY: Past Medical History:  Diagnosis Date   Anemia    Arthritis    Asthma    Diabetes insipidus (HCC)    Fibromyalgia    Lupus ([place])    Mitral valve prolapse    SLE (systemic lupus erythematosus) ([place]) 12/06/2012   Thyroid disease     PAST SURGICAL HISTORY: Past Surgical History:  Procedure Laterality Date   PITUITARY EXCISION     TONSILLECTOMY      FAMILY HISTORY: [name]; Cancer in her maternal grandmother; Dementia in her mother; Diabetes in her father, paternal grandfather, and sister; Healthy in her daughter and son; Heart disease in her father, paternal grandfather, and sister; Hyperlipidemia in her father and paternal grandfather; Hypertension in her father and paternal grandfather; Kidney disease in her sister; Pancreatic disease  in her sister; Stroke in her father.   SOCIAL HISTORY:  The patient  reports that she has never smoked. She has never been exposed to tobacco smoke. She has never used smokeless tobacco. She reports that she does not drink alcohol and does not use drugs.  Review of Systems   Cardiovascular:  Negative for chest pain, dyspnea on exertion, leg swelling, palpitations and syncope.  Respiratory:  Positive for shortness of breath (chronic and stable.).     PHYSICAL EXAM:    09/11/2021    2:56 PM 09/01/2021    2:48 PM 05/15/2021    2:50 PM  Vitals with BMI  Height 5\' 6"  5\' 6"  5\' 6"   Weight 144 lbs 3 oz 145 lbs 139 lbs 13 oz  BMI 23.29 23.41 22.58  Systolic 110 114  Diastolic 68 77 72  Pulse 72 74 75    CONSTITUTIONAL: Well-developed and well-nourished. No acute distress.  SKIN: Skin is warm and dry. No rash noted. No cyanosis. No pallor. No jaundice HEAD: Normocephalic and atraumatic.  EYES: No scleral icterus MOUTH/THROAT: Moist oral membranes.  NECK: No JVD present. No thyromegaly noted. No carotid bruits  CHEST Normal respiratory effort. No intercostal retractions  LUNGS: Clear to auscultation bilaterally. No stridor. No wheezes. No rales.  CARDIOVASCULAR: Regular rate and rhythm, positive S1-S2,  no murmurs, rubs or gallops appreciated. ABDOMINAL: No apparent ascites.  EXTREMITIES: No peripheral edema  HEMATOLOGIC: No significant bruising NEUROLOGIC: Oriented to person, place, and time. Nonfocal. Normal muscle tone.  PSYCHIATRIC: Normal mood and affect. Normal behavior. Cooperative No significant change in physical examination since last visit  CARDIAC DATABASE: EKG: 11/01/2020: Normal sinus rhythm, 71 bpm, normal axis, consider old anteroseptal infarct, without underlying ischemia or injury pattern.    Echocardiogram: 09/04/2021: Normal LV systolic function with visual EF 60-65%. Left ventricle cavity is normal in size. Normal left ventricular wall thickness. Normal global wall motion. Normal diastolic filling pattern, normal LAP. Mild (Grade I) mitral regurgitation. Compared to 03/04/2020 Moderate MR is now mild, Mild TR is now trivial, otherwise no significant change.  Stress Testing:  Lexiscan 05/08/2016: Nuclear stress EF: 64%. There was  no ST segment deviation noted during stress. Defect 1: There is a medium defect of moderate severity present in the mid anterior and apical anterior location. The study is normal. This is a low risk study. The left ventricular ejection fraction is normal (55-65%) Low risk stress nuclear study with breast attenuation; no ischemia; EF 64 with normal wall motion.  Heart Catheterization: None  30-day event monitor 6/17-7/16/2020: Normal sinus rhythm. One auto detected event occurred on day 30 at 1647 for sinus tachycardia at 149 bpm. No patient triggered events occurred. Fastest heart rate was 149 bpm. No A. fib or SVT was noted.  LABORATORY DATA:    Latest Ref Rng & Units 09/01/2021    3:36 PM 03/26/2021   10:38 AM 01/23/2021    9:45 AM  CBC  WBC 3.8 - 10.8 Thousand/uL 2.9  3.7  3.4   Hemoglobin 11.7 - 15.5 g/dL 09/03/2021  03/28/2021  01/25/2021   Hematocrit 35.0 - 45.0 % 37.8  41.7  41.8   Platelets 140 - 400 Thousand/uL 213  201  236        Latest Ref Rng & Units 09/01/2021    3:36 PM 03/26/2021   10:38 AM 01/23/2021    9:45 AM  CMP  Glucose 65 - 99 mg/dL 90  70  82   BUN 7 -  25 mg/dL 18  13  16    Creatinine 0.50 - 1.05 mg/dL  9.37  3.42   Sodium 135 - 146 mmol/L 137  136  139   Potassium 3.5 - 5.3 mmol/L 4.1  3.8  4.3   Chloride 98 - 110 mmol/L 102  97  100   CO2 20 - 32 mmol/L 30  29  30    Calcium 8.6 - 10.4 mg/dL 9.4  8.76    Total Protein 6.1 - 8.1 g/dL 6.1 - 8.1 g/dL 7.4    7.4   8.3    8.0   Total Bilirubin 0.2 - 1.2 mg/dL 0.6   0.7   AST 10 - 35 U/L 27   28   ALT 6 - 29 U/L 16   17    BNP (last 3 results) No results for input(s): "BNP" in the last 8760 hours.  ProBNP (last 3 results) No results for input(s): "PROBNP" in the last 8760 hours.   Cardiac Panel (last 3 results) No results for input(s): "CKTOTAL", "CKMB", "TROPONINIHS", "RELINDX" in the last 72 hours.  IMPRESSION:    ICD-10-CM   1. Dyspnea on exertion  R06.09 EKG 12-Lead    2. Nonrheumatic mitral  valve regurgitation  I34.0     3. Nonrheumatic tricuspid valve regurgitation  I36.1        RECOMMENDATIONS: Shantice Menger is a 61 y.o. 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  Initially referred to the practice for evaluation and management of palpitations and dyspnea on exertion.  Patient has undergone cardiac work-up as outlined above and since last office visit has undergone a repeat echocardiogram to evaluate the progression of valvular heart disease.  Patient is noted to have preserved LVEF, improvement in both mitral and tricuspid regurgitation.  Clinically she denies anginal discomfort or heart failure symptoms.  Her overall functional capacity remains relatively stable.  No additional cardiovascular testing warranted at this time.  Given her age and risk factors that shared decision is to follow-up in 3 years or sooner if needed.  Would like to repeat an echocardiogram prior to her next office visit in 3 years to reevaluate the progression of mitral regurgitation.  Patient is thankful for the care provided and is more than welcome to see me sooner if change in clinical status.  FINAL MEDICATION LIST END OF ENCOUNTER: No orders of the defined types were placed in this encounter.   Medications Discontinued During This Encounter  Medication Reason   fluticasone (FLONASE) 50 MCG/ACT nasal spray    Turmeric 450 MG CAPS      Current Outpatient Medications:    albuterol (PROVENTIL HFA;VENTOLIN HFA) 108 (90 BASE) MCG/ACT inhaler, Inhale 1 puff into the lungs every 6 (six) hours as needed for wheezing or shortness of breath., Disp: , Rfl:    azaTHIOprine (IMURAN) 50 MG tablet, TAKE 1 AND 1/2 TABLETS BY MOUTH DAILY, Disp: 135 tablet, Rfl: 0   levothyroxine (SYNTHROID,  LEVOTHROID) 125 MCG tablet, Take 125 mcg by mouth at bedtime. , Disp: , Rfl:    MITIGARE 0.6 MG CAPS, TAKE 1 CAPSULE BY MOUTH EVERY DAY, Disp: 90 capsule, Rfl: 0   Polyvinyl Alcohol-Povidone (REFRESH OP), Apply to eye as needed., Disp: , Rfl:    rizatriptan (MAXALT) 10 MG tablet, Take 10 mg by mouth daily as needed for migraine. May repeat in  2 hours if needed, Disp: , Rfl:   Orders Placed This Encounter  Procedures   EKG 12-Lead   --Continue cardiac medications as reconciled in final medication list. --Return in about 3 years (around 09/11/2024) for Follow up, Dyspnea, valvular heart disease, echo prior to appt. . Or sooner if needed. --Continue follow-up with your primary care physician regarding the management of your other chronic comorbid conditions.  Patient's questions and concerns were addressed to her satisfaction. She voices understanding of the instructions provided during this encounter.   This note was created using a voice recognition software as a result there may be grammatical errors inadvertently enclosed that do not reflect the nature of this encounter. Every attempt is made to correct such errors.  Rex Kras, [place], [facility]  Pager: [phone] Office: [phone]

## 2021-09-24 ENCOUNTER — Other Ambulatory Visit (HOSPITAL_COMMUNITY): Payer: 59

## 2021-09-27 ENCOUNTER — Ambulatory Visit (HOSPITAL_COMMUNITY)
Admission: RE | Admit: 2021-09-27 | Discharge: 2021-09-27 | Disposition: A | Discharge status: Home / Self Care | Payer: 59 | Source: Ambulatory Visit | Attending: Rheumatology | Admitting: Rheumatology

## 2021-09-27 DIAGNOSIS — S83272A Complex tear of lateral meniscus, current injury, left knee, initial encounter: Secondary | ICD-10-CM | POA: Diagnosis not present

## 2021-09-27 DIAGNOSIS — M1712 Unilateral primary osteoarthritis, left knee: Secondary | ICD-10-CM | POA: Diagnosis not present

## 2021-09-27 DIAGNOSIS — M25462 Effusion, left knee: Secondary | ICD-10-CM | POA: Diagnosis not present

## 2021-09-27 DIAGNOSIS — M170 Bilateral primary osteoarthritis of knee: Secondary | ICD-10-CM

## 2021-09-27 DIAGNOSIS — S83412A Sprain of medial collateral ligament of left knee, initial encounter: Secondary | ICD-10-CM | POA: Diagnosis not present

## 2021-09-28 NOTE — Progress Notes (Signed)
MRI of the knee joint is consistent with osteoarthritis and meniscal tear.  Please refer patient to orthopedics.

## 2021-09-29 ENCOUNTER — Other Ambulatory Visit: Payer: Self-pay

## 2021-09-29 DIAGNOSIS — S83282A Other tear of lateral meniscus, current injury, left knee, initial encounter: Secondary | ICD-10-CM

## 2021-10-03 ENCOUNTER — Other Ambulatory Visit: Payer: Self-pay | Admitting: Internal Medicine

## 2021-10-03 ENCOUNTER — Ambulatory Visit: Payer: 59 | Admitting: Orthopaedic Surgery

## 2021-10-03 ENCOUNTER — Telehealth: Payer: Self-pay | Admitting: Internal Medicine

## 2021-10-03 DIAGNOSIS — S83282A Other tear of lateral meniscus, current injury, left knee, initial encounter: Secondary | ICD-10-CM | POA: Diagnosis not present

## 2021-10-03 DIAGNOSIS — Z1231 Encounter for screening mammogram for malignant neoplasm of breast: Secondary | ICD-10-CM

## 2021-10-03 NOTE — Progress Notes (Signed)
Office Visit Note   Patient: Stefanie [name]           Date of Birth: 06-17-1960           MRN: [id number] Visit Date: 10/03/2021              Requested by: Bo Merino, MD [address] [place],  [place] [address] PCP: Shon Baton, MD   Assessment & Plan: Visit Diagnoses:  1. Acute lateral meniscus tear of left knee, initial encounter     Plan: Impression is acute left lateral meniscus tear.  She does have chondromalacia of the lateral compartment as well.  Based on findings the lateral meniscus tear seems to be responsible for the majority of her symptoms.  Do not feel that she is quite ready for knee replacement at this time.  My recommendation is for arthroscopic partial lateral meniscectomy and chondroplasty as indicated.  Questions encouraged and answered.  Risk benefits prognosis reviewed.  Of asked her to stop taking the Imuran a week before surgery and a week after surgery.  She met with Jackelyn Poling today to look at surgery dates.  Follow-Up Instructions: No follow-ups on file.   Orders:  No orders of the defined types were placed in this encounter.  No orders of the defined types were placed in this encounter.     Procedures: No procedures performed   Clinical Data: No additional findings.   Subjective: Chief Complaint  Patient presents with   Left Knee - Pain    HPI Stefanie [name] is a very pleasant 61 year old female patient of Stefanie [name] who comes in for chronic left knee pain with recent MRI which showed a complex tear of the lateral meniscus.  She reports catching and locking to the outside part of the knee.  She does have arthritis as well but her symptoms are different than the arthritis that she has gotten used to living with.  Pain is worse towards the end of the day.  She has had a cortisone injection about 5 months ago which did not provide significant relief.  Usually these injections are quite effective.  Review of Systems   Constitutional: Negative.   HENT: Negative.    Eyes: Negative.   Respiratory: Negative.    Cardiovascular: Negative.   Endocrine: Negative.   Musculoskeletal: Negative.   Neurological: Negative.   Hematological: Negative.   Psychiatric/Behavioral: Negative.    All other systems reviewed and are negative.    Objective: Vital Signs: LMP 04/09/2014 (Approximate)   Physical Exam Vitals and nursing note reviewed.  Constitutional:      Appearance: She is well-developed.  HENT:     Head: Atraumatic.     Nose: Nose normal.  Eyes:     Extraocular Movements: Extraocular movements intact.  Cardiovascular:     Pulses: Normal pulses.  Pulmonary:     Effort: Pulmonary effort is normal.  Abdominal:     Palpations: Abdomen is soft.  Musculoskeletal:     Cervical back: Neck supple.  Skin:    General: Skin is warm.     Capillary Refill: Capillary refill takes less than 2 seconds.  Neurological:     Mental Status: She is alert. Mental status is at baseline.  Psychiatric:        Behavior: Behavior normal.        Thought Content: Thought content normal.        Judgment: Judgment normal.     Ortho Exam Examination of left knee shows  effusion.  Lateral joint line tenderness.  Positive McMurray.  Collaterals and cruciates are stable.  Slight valgus alignment.  Normal range of motion. Specialty Comments:  No specialty comments available.  Imaging: No results found.   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/28/2016   Prolonged QT  interval 07/28/2016   Primary osteoarthritis of both hands 05/11/2016   Primary osteoarthritis of both knees 05/11/2016   History of hypothyroidism 05/11/2016   History of migraine 05/11/2016   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, 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, 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, anticardiolipin antibody and positive rheumatoid factor 04/29/2016   ANA positive 04/29/2016   Rheumatoid factor positive 04/29/2016   Pseudogout 04/29/2016   Chondrocalcinosis 04/29/2016   High risk medication use 04/29/2016   Sjogren's syndrome with keratoconjunctivitis sicca ([place]) 04/29/2016   History of neutropenia 04/29/2016   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   Hyponatremia 12/12/2013   Fever 12/13/2012   Pleuritic chest pain 12/06/2012   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   Anxiety 12/06/2012   ANEMIA-IRON DEFICIENCY 01/08/2010   GASTRIC DILATION, ACUTE 01/08/2010   NAUSEA ALONE 01/08/2010   ABDOMINAL PAIN, LEFT UPPER QUADRANT 01/08/2010   Past Medical History:  Diagnosis Date   Anemia    Arthritis    Asthma    Diabetes insipidus ([place])    Fibromyalgia    Lupus ([place])    Mitral valve prolapse    SLE (systemic lupus erythematosus) ([place]) 12/06/2012   Thyroid disease     Family History  Problem Relation Age of Onset   Atrial fibrillation Mother    Dementia Mother    Diabetes Father    Heart disease Father    Hyperlipidemia Father    Hypertension Father    Stroke Father    Diabetes Sister    Kidney disease Sister        transplant   Heart disease Sister        open heart surgery    Pancreatic disease Sister        transplant    Cancer Maternal Grandmother    Diabetes Paternal Grandfather    Heart disease Paternal Grandfather    Hyperlipidemia Paternal Grandfather    Hypertension Paternal [employer]chant [employer] officer    Healthy Daughter    Healthy Son     Past Surgical History:  Procedure Laterality Date   PITUITARY EXCISION     TONSILLECTOMY     Social History   Occupational History   Not on file   Tobacco Use   Smoking status: Never    Passive exposure: Never   Smokeless tobacco: Never  Vaping Use   Vaping Use: Never used  Substance and Sexual Activity   Alcohol use: No   Drug use: No   Sexual activity: Not on file

## 2021-10-06 ENCOUNTER — Encounter: Payer: Self-pay | Admitting: Internal Medicine

## 2021-10-07 ENCOUNTER — Encounter: Payer: Self-pay | Admitting: Internal Medicine

## 2021-10-07 ENCOUNTER — Ambulatory Visit: Payer: 59 | Admitting: Orthopaedic Surgery

## 2021-10-08 ENCOUNTER — Encounter: Payer: Self-pay | Admitting: Pulmonary Disease

## 2021-10-08 ENCOUNTER — Ambulatory Visit: Payer: 59 | Admitting: Pulmonary Disease

## 2021-10-08 VITALS — BP 110/60 | HR 82 | Temp 98.2°F | Ht 67.0 in | Wt 144.8 lb

## 2021-10-08 DIAGNOSIS — M3501 Sicca syndrome with keratoconjunctivitis: Secondary | ICD-10-CM

## 2021-10-08 DIAGNOSIS — R0609 Other forms of dyspnea: Secondary | ICD-10-CM

## 2021-10-08 DIAGNOSIS — J842 Lymphoid interstitial pneumonia: Secondary | ICD-10-CM | POA: Diagnosis not present

## 2021-10-08 NOTE — Progress Notes (Signed)
Stefanie [name]    [id number]    29-Sep-1960  Primary Care Physician:Russo, Jonny Ruiz, MD  Referring Physician: Creola Corn, MD [address] [place],  [place] [address]  Problem list:  Follow-up for Sjogren's syndrome, on Imuran since 2021 ILD, lymphocytic interstitial pneumonia  HPI: 61 year old with history of asthma, palpitations, Sjogrens syndrome Sent in for evaluation of chronic dyspnea on exertion for the past few years associated with chest tightness, occasional wheezing She is on albuterol inhaler for asthma which she uses very seldom Also has seasonal allergies for which she uses over-the-counter antihistamines.  Follows with Dr. Corliss Skains, rheumatology with positive ANA, Ro, La, Cardiolipin and rheumatoid factor.   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  No mold, hot tub, Jacuzzi.  No down pillows or comforter Smoking history: Never smoker Travel history: No significant travel history Relevant family history: No significant family history of lung disease  Interim history: Here for follow-up of ILD related to Sjogren's syndrome Continues on Imuran and colchicine.  Taken off Plaquenil by rheumatology Breathing is stable with no issues  She contracted COVID twice in February 2023 treated with Paxlovid and molnupiravir and then had flu infection.  Overall improved since this episode.  Outpatient Encounter Medications as of 10/08/2021  Medication Sig   albuterol (PROVENTIL HFA;VENTOLIN HFA) 108 (90 BASE) MCG/ACT inhaler Inhale 1 puff into the lungs every 6 (six) hours as needed for wheezing or shortness of breath.   azaTHIOprine (IMURAN) 50 MG tablet TAKE 1 AND 1/2 TABLETS BY MOUTH DAILY   levothyroxine (SYNTHROID, LEVOTHROID) 125 MCG tablet Take 125 mcg by mouth at bedtime.    MITIGARE 0.6 MG CAPS TAKE 1 CAPSULE BY MOUTH EVERY DAY    Polyvinyl Alcohol-Povidone (REFRESH OP) Apply to eye as needed.   rizatriptan (MAXALT) 10 MG tablet Take 10 mg by mouth daily as needed for migraine. May repeat in 2 hours if needed   No facility-administered encounter medications on file as of 10/08/2021.   Physical Exam: Blood pressure 110/60, pulse 82, temperature 98.2 F (36.8 C), temperature source Oral, height 5\' 7"  (1.702 m), weight 144 lb 12.8 oz (65.7 kg), last menstrual period 04/09/2014, SpO2 99 %. Gen:      No acute distress HEENT:  EOMI, sclera anicteric Neck:     No masses; no thyromegaly Lungs:    Clear to auscultation bilaterally; normal respiratory effort CV:         Regular rate and rhythm; no murmurs Abd:      + bowel sounds; soft, non-tender; no palpable masses, no distension Ext:    No edema; adequate peripheral perfusion Skin:      Warm and dry; no rash Neuro: alert and oriented x 3 Psych: normal mood and affect   Data Reviewed: Imaging: CTA 08/16/2014-mild right middle lobe bronchovascular opacities with tree-in-bud, scattered lung cysts. High-res CT 08/01/2019-cystic and nodular interstitial lung disease consistent with lymphocytic interstitial pneumonia.  Alternate diagnosis. CTA 03/26/2021-no pulmonary embolism, stable bullous changes, pulmonary nodules I have reviewed the images personally.  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

## 2021-10-08 NOTE — Patient Instructions (Signed)
We will get CT high-resolution for evaluation of the lung Order pulmonary function test in 6 months Follow-up in 6 months after PFTs.

## 2021-10-20 ENCOUNTER — Ambulatory Visit (AMBULATORY_SURGERY_CENTER): Payer: Self-pay | Admitting: *Deleted

## 2021-10-20 ENCOUNTER — Telehealth: Payer: Self-pay | Admitting: Internal Medicine

## 2021-10-20 VITALS — Ht 66.0 in | Wt 141.0 lb

## 2021-10-20 DIAGNOSIS — Z1211 Encounter for screening for malignant neoplasm of colon: Secondary | ICD-10-CM

## 2021-10-20 MED ORDER — NA SULFATE-K SULFATE-MG SULF 17.5-3.13-1.6 GM/177ML PO SOLN: 1.0000 | Freq: Once | ORAL | 0 refills | Status: AC

## 2021-10-20 NOTE — Progress Notes (Signed)
Pt diagnosed with covid 10-15-21   No trouble with anesthesia, denies being told they were difficult to intubate, or hx/fam hx of malignant hyperthermia per pt   No egg or soy allergy  No home oxygen use   No medications for weight loss taken  emmi information given  2 day Suprep/Miralax given  Pt informed that we do not do prior authorizations for prep

## 2021-10-20 NOTE — Telephone Encounter (Signed)
PT tested positive for covid again and has a previsit appointment today. Wants to know can it be done over the phone. Please advise. Thank you

## 2021-10-20 NOTE — Telephone Encounter (Signed)
Spoke with pt and she is aware that we will do her PV over the phone this afternoon

## 2021-10-30 ENCOUNTER — Encounter: Payer: Self-pay | Admitting: Internal Medicine

## 2021-11-03 ENCOUNTER — Ambulatory Visit
Admission: RE | Admit: 2021-11-03 | Discharge: 2021-11-03 | Disposition: A | Discharge status: Home / Self Care | Payer: 59 | Source: Ambulatory Visit | Attending: Pulmonary Disease | Admitting: Pulmonary Disease

## 2021-11-03 DIAGNOSIS — J984 Other disorders of lung: Secondary | ICD-10-CM | POA: Diagnosis not present

## 2021-11-03 DIAGNOSIS — M3501 Sicca syndrome with keratoconjunctivitis: Secondary | ICD-10-CM

## 2021-11-03 DIAGNOSIS — R0609 Other forms of dyspnea: Secondary | ICD-10-CM

## 2021-11-03 DIAGNOSIS — J84112 Idiopathic pulmonary fibrosis: Secondary | ICD-10-CM | POA: Diagnosis not present

## 2021-11-03 DIAGNOSIS — R918 Other nonspecific abnormal finding of lung field: Secondary | ICD-10-CM | POA: Diagnosis not present

## 2021-11-03 DIAGNOSIS — I2510 Atherosclerotic heart disease of native coronary artery without angina pectoris: Secondary | ICD-10-CM | POA: Diagnosis not present

## 2021-11-04 ENCOUNTER — Ambulatory Visit
Admission: RE | Admit: 2021-11-04 | Discharge: 2021-11-04 | Disposition: A | Discharge status: Home / Self Care | Payer: 59 | Source: Ambulatory Visit | Attending: Internal Medicine | Admitting: Internal Medicine

## 2021-11-04 DIAGNOSIS — Z1231 Encounter for screening mammogram for malignant neoplasm of breast: Secondary | ICD-10-CM | POA: Diagnosis not present

## 2021-11-11 ENCOUNTER — Ambulatory Visit (AMBULATORY_SURGERY_CENTER): Payer: 59 | Admitting: Internal Medicine

## 2021-11-11 ENCOUNTER — Encounter: Payer: Self-pay | Admitting: Internal Medicine

## 2021-11-11 VITALS — BP 102/64 | HR 76 | Temp 97.7°F | Resp 15 | Ht 66.5 in | Wt 139.2 lb

## 2021-11-11 DIAGNOSIS — I493 Ventricular premature depolarization: Secondary | ICD-10-CM | POA: Diagnosis not present

## 2021-11-11 DIAGNOSIS — K219 Gastro-esophageal reflux disease without esophagitis: Secondary | ICD-10-CM | POA: Diagnosis not present

## 2021-11-11 DIAGNOSIS — J45909 Unspecified asthma, uncomplicated: Secondary | ICD-10-CM | POA: Diagnosis not present

## 2021-11-11 DIAGNOSIS — Z86010 Personal history of colonic polyps: Secondary | ICD-10-CM

## 2021-11-11 DIAGNOSIS — Z1211 Encounter for screening for malignant neoplasm of colon: Secondary | ICD-10-CM | POA: Diagnosis not present

## 2021-11-11 DIAGNOSIS — E119 Type 2 diabetes mellitus without complications: Secondary | ICD-10-CM | POA: Diagnosis not present

## 2021-11-11 MED ORDER — SODIUM CHLORIDE 0.9 % IV SOLN: 500.0000 mL | Freq: Once | INTRAVENOUS | Status: DC

## 2021-11-11 NOTE — Patient Instructions (Addendum)
I am pleased to tell you that the colonoscopy was normal. No polyps or cancer were seen.  Next routine colonoscopy or other screening test in 10 years - 2033.  I appreciate the opportunity to care for you. Iva Boop, MD, FACG   YOU HAD AN ENDOSCOPIC PROCEDURE TODAY AT THE  ENDOSCOPY CENTER:   Refer to the procedure report that was given to you for any specific questions about what was found during the examination.  If the procedure report does not answer your questions, please call your gastroenterologist to clarify.  If you requested that your care partner not be given the details of your procedure findings, then the procedure report has been included in a sealed envelope for you to review at your convenience later.  YOU SHOULD EXPECT: Some feelings of bloating in the abdomen. Passage of more gas than usual.  Walking can help get rid of the air that was put into your GI tract during the procedure and reduce the bloating. If you had a lower endoscopy (such as a colonoscopy or flexible sigmoidoscopy) you may notice spotting of blood in your stool or on the toilet paper. If you underwent a bowel prep for your procedure, you may not have a normal bowel movement for a few days.  Please Note:  You might notice some irritation and congestion in your nose or some drainage.  This is from the oxygen used during your procedure.  There is no need for concern and it should clear up in a day or so.  SYMPTOMS TO REPORT IMMEDIATELY:  Following lower endoscopy (colonoscopy or flexible sigmoidoscopy):  Excessive amounts of blood in the stool  Significant tenderness or worsening of abdominal pains  Swelling of the abdomen that is new, acute  Fever of 100F or higher  For urgent or emergent issues, a gastroenterologist can be reached at any hour by calling [phone]. Do not use MyChart messaging for urgent concerns.    DIET:  We do recommend a small meal at first, but then you may proceed to  your regular diet.  Drink plenty of fluids but you should avoid alcoholic beverages for 24 hours.  ACTIVITY:  You should plan to take it easy for the rest of today and you should NOT DRIVE or use heavy machinery until tomorrow (because of the sedation medicines used during the test).    FOLLOW UP: Our staff will call the number listed on your records the next business day following your procedure.  We will call around 7:15- 8:00 am to check on you and address any questions or concerns that you may have regarding the information given to you following your procedure. If we do not reach you, we will leave a message.  If you develop any symptoms (ie: fever, flu-like symptoms, shortness of breath, cough etc.) before then, please call [phone].  If you test positive for Covid 19 in the 2 weeks post procedure, please call and report this information to [place].    If any biopsies were taken you will be contacted by phone or by letter within the next 1-3 weeks.  Please call us at [phone] if you have not heard about the biopsies in 3 weeks.    SIGNATURES/CONFIDENTIALITY: You and/or your care partner have signed paperwork which will be entered into your electronic medical record.  These signatures attest to the fact that that the information above on your After Visit Summary has been reviewed and is understood.  Full responsibility of  the confidentiality of this discharge information lies with you and/or your care-partner.

## 2021-11-11 NOTE — Progress Notes (Signed)
VS completed by DT.  Pt's states no medical or surgical changes since previsit or office visit.  

## 2021-11-11 NOTE — Op Note (Signed)
[facility] Patient Name: Stefanie [name] Procedure Date: 11/11/2021 2:08 PM MRN: [id number] Endoscopist: Gatha Mayer , MD Age: 61 Referring MD:  Date of Birth: February 12, 1961 Gender: Female Account #: [id number] Procedure:                Colonoscopy Indications:              Screening for colorectal malignant neoplasm, Last                            colonoscopy: 2006 Medicines:                Monitored Anesthesia Care Procedure:                Pre-Anesthesia Assessment:                           - Prior to the procedure, a History and Physical                            was performed, and patient medications and                            allergies were reviewed. The patient's tolerance of                            previous anesthesia was also reviewed. The risks                            and benefits of the procedure and the sedation                            options and risks were discussed with the patient.                            All questions were answered, and informed consent                            was obtained. Prior Anticoagulants: The patient has                            taken no previous anticoagulant or antiplatelet                            agents. ASA Grade Assessment: II - A patient with                            mild systemic disease. After reviewing the risks                            and benefits, the patient was deemed in                            satisfactory condition to undergo the procedure.  After obtaining informed consent, the colonoscope                            was passed under direct vision. Throughout the                            procedure, the patient's blood pressure, pulse, and                            oxygen saturations were monitored continuously. The                            Olympus scope [id number] was introduced through the                            anus and advanced to the the cecum,  identified by                            appendiceal orifice and ileocecal valve. The                            colonoscopy was performed without difficulty. The                            patient tolerated the procedure well. The quality                            of the bowel preparation was good. The ileocecal                            valve, appendiceal orifice, and rectum were                            photographed. The bowel preparation used was                            Miralax via split dose instruction. The bowel                            preparation used was SUPREP via split dose                            instruction. Scope In: 2:51:40 PM Scope Out: 3:03:55 PM Scope Withdrawal Time: 0 hours 9 minutes 4 seconds  Total Procedure Duration: 0 hours 12 minutes 15 seconds  Findings:                 The perianal and digital rectal examinations were                            normal.                           The entire examined colon appeared normal on direct  and retroflexion views. Complications:            No immediate complications. Estimated Blood Loss:     Estimated blood loss: none. Impression:               - The entire examined colon is normal on direct and                            retroflexion views.                           - No specimens collected. Recommendation:           - Patient has a contact number available for                            emergencies. The signs and symptoms of potential                            delayed complications were discussed with the                            patient. Return to normal activities tomorrow.                            Written discharge instructions were provided to the                            patient.                           - Resume previous diet.                           - Continue present medications.                           - Repeat colonoscopy or other appropriate test in                             10 years for screening purposes. Iva Boop, MD 11/11/2021 3:09:08 PM This report has been signed electronically.

## 2021-11-11 NOTE — Progress Notes (Signed)
[facility] History and Physical   Primary Care Physician:  Creola Corn, MD   Reason for Procedure:   CRCA screening  Plan:    colonoscopy     HPI: Stefanie [name] is a 61 y.o. female here for screening exam   Past Medical History:  Diagnosis Date   Anemia    Arthritis    Asthma    Diabetes insipidus (HCC)    Fibromyalgia    Lupus (HCC)    Mitral valve prolapse    PVC's (premature ventricular contractions)    SLE (systemic lupus erythematosus) (HCC) 12/06/2012   Thyroid disease     Past Surgical History:  Procedure Laterality Date   COLONOSCOPY     PITUITARY EXCISION     TONSILLECTOMY      Prior to Admission medications   Medication Sig Start Date End Date Taking? Authorizing Provider  azaTHIOprine (IMURAN) 50 MG tablet TAKE 1 AND 1/2 TABLETS BY MOUTH DAILY 04/16/21  Yes Deveshwar, Janalyn Rouse, MD  levothyroxine (SYNTHROID, LEVOTHROID) 125 MCG tablet Take 125 mcg by mouth at bedtime.  06/14/14  Yes [provider]  MITIGARE 0.6 MG CAPS TAKE 1 CAPSULE BY MOUTH EVERY DAY 01/15/21  Yes Deveshwar, Janalyn Rouse, MD  Polyvinyl Alcohol-Povidone (REFRESH OP) Apply to eye as needed.   Yes [provider]  albuterol (PROVENTIL HFA;VENTOLIN HFA) 108 (90 BASE) MCG/ACT inhaler Inhale 1 puff into the lungs every 6 (six) hours as needed for wheezing or shortness of breath.    [provider]  rizatriptan (MAXALT) 10 MG tablet Take 10 mg by mouth daily as needed for migraine. May repeat in 2 hours if needed Patient not taking: Reported on 11/11/2021    [provider]    Current Outpatient Medications  Medication Sig Dispense Refill   azaTHIOprine (IMURAN) 50 MG tablet TAKE 1 AND 1/2 TABLETS BY MOUTH DAILY 135 tablet 0   levothyroxine (SYNTHROID, LEVOTHROID) 125 MCG tablet Take 125 mcg by mouth at bedtime.      MITIGARE 0.6 MG CAPS TAKE 1 CAPSULE BY MOUTH EVERY DAY 90 capsule 0   Polyvinyl Alcohol-Povidone (REFRESH OP) Apply to eye as needed.      albuterol (PROVENTIL HFA;VENTOLIN HFA) 108 (90 BASE) MCG/ACT inhaler Inhale 1 puff into the lungs every 6 (six) hours as needed for wheezing or shortness of breath.     rizatriptan (MAXALT) 10 MG tablet Take 10 mg by mouth daily as needed for migraine. May repeat in 2 hours if needed (Patient not taking: Reported on 11/11/2021)     Current Facility-Administered Medications  Medication Dose Route Frequency Provider Last Rate Last Admin   0.9 %  sodium chloride infusion  500 mL Intravenous Once Iva Boop, MD       0.9 %  sodium chloride infusion  500 mL Intravenous Once Iva Boop, MD        Allergies as of 11/11/2021 - Review Complete 11/11/2021  Allergen Reaction Noted   Iron sucrose Rash 04/29/2011   Morphine  04/29/2011   Venofer [ferric oxide] Palpitations 12/07/2012   Azithromycin Other (See Comments) 01/08/2010   Other  04/29/2011    Family History  Problem Relation Age of Onset   Atrial fibrillation Mother    Dementia Mother    Diabetes Father    Heart disease Father    Hyperlipidemia Father    Hypertension Father    Stroke Father    Diabetes Sister    Kidney disease Sister  transplant   Heart disease Sister        open heart surgery    Pancreatic disease Sister        transplant    Cancer Maternal Grandmother    Diabetes Paternal Grandfather    Heart disease Paternal Grandfather    Hyperlipidemia Paternal Grandfather    Hypertension Paternal Actor    Healthy Daughter    Healthy Son    Colon cancer Neg Hx    Esophageal cancer Neg Hx    Rectal cancer Neg Hx    Stomach cancer Neg Hx     Social History   Socioeconomic History   Marital status: Married    Spouse name: Not on file   Number of children: 2   Years of education: Not on file   Highest education level: Not on file  Occupational History   Not on file  Tobacco Use   Smoking status: Never    Passive exposure: Never   Smokeless tobacco: Never  Vaping Use   Vaping Use:  Never used  Substance and Sexual Activity   Alcohol use: No   Drug use: No   Sexual activity: Not on file  Other Topics Concern   Not on file  Social History Narrative   Right handed   Social Determinants of Health   Financial Resource Strain: Not on file  Food Insecurity: Not on file  Transportation Needs: Not on file  Physical Activity: Not on file  Stress: Not on file  Social Connections: Not on file  Intimate Partner Violence: Not on file    Review of Systems:  All other review of systems negative except as mentioned in the HPI.  Physical Exam: Vital signs BP 121/69   Pulse 87   Temp 97.7 F (36.5 C) (Temporal)   Ht 5' 6.5" (1.689 m)   Wt 139 lb 3.2 oz (63.1 kg)   LMP 04/09/2014 (Approximate)   SpO2 99%   BMI 22.13 kg/m   General:   Alert,  Well-developed, well-nourished, pleasant and cooperative in NAD Lungs:  Clear throughout to auscultation.   Heart:  Regular rate and rhythm; no murmurs, clicks, rubs,  or gallops. Abdomen:  Soft, nontender and nondistended. Normal bowel sounds.   Neuro/Psych:  Alert and cooperative. Normal mood and affect. A and O x 3   @Kayslee Furey  , MD, [facility] [phone] (pager) 11/11/2021 2:44 PM@

## 2021-11-11 NOTE — Progress Notes (Signed)
To pacu, VSS. Report to [email or web address] 

## 2021-11-12 ENCOUNTER — Telehealth: Payer: Self-pay | Admitting: *Deleted

## 2021-11-12 NOTE — Telephone Encounter (Signed)
  Follow up Call-     11/11/2021    2:32 PM  Call back number  Post procedure Call Back phone  # [phone]  Permission to leave phone message Yes     Patient questions:  Do you have a fever, pain , or abdominal swelling? No. Pain Score  0 *  Have you tolerated food without any problems? Yes.    Have you been able to return to your normal activities? Yes.    Do you have any questions about your discharge instructions: Diet   No. Medications  No. Follow up visit  No.  Do you have questions or concerns about your Care? No.  Actions: * If pain score is 4 or above: No action needed, pain <4.

## 2021-11-13 ENCOUNTER — Other Ambulatory Visit: Payer: Self-pay | Admitting: Physician Assistant

## 2021-11-13 MED ORDER — HYDROCODONE-ACETAMINOPHEN 5-325 MG PO TABS: 1.0000 | ORAL_TABLET | Freq: Three times a day (TID) | ORAL | 0 refills | Status: DC | PRN

## 2021-11-17 ENCOUNTER — Other Ambulatory Visit: Payer: Self-pay | Admitting: Physician Assistant

## 2021-11-19 ENCOUNTER — Telehealth: Payer: Self-pay | Admitting: Orthopaedic Surgery

## 2021-11-19 ENCOUNTER — Telehealth: Payer: Self-pay | Admitting: Pulmonary Disease

## 2021-11-19 ENCOUNTER — Encounter: Payer: 59 | Admitting: Internal Medicine

## 2021-11-19 NOTE — Telephone Encounter (Signed)
Patient was scheduled at [facility] 11-20-21 for left knee partial lateral meniscectomy with Dr. Roda Shutters. Surgery was cancelled on 11-19-21 after receiving the following e-mail from Pre-op team at [facility] and speaking with Tanya.  I called Dr. Laney Potash Mannam's office to confirm fax number and send request for clearance/risk stratification.  Dr. Isaiah Serge was not in the office today and will not be there tomorrow. Patient is aware surgery has been cancelled and we are working toward getting a clearance from Dr. Isaiah Serge so we can reschedule with a [facility] facility.     Hi Debbie,   The anesthesia MD team just completed review for pt [name] for tomorrow with Dr. Roda Shutters. They will not clear her for sx tomorrow without a pulmonary clearance. I know we only have limited time as her sx is tomorrow, but if you can obtain and provide before end of business day, please. Otherwise, pt needs to be canceled or r/s until pulmonary clearance has been obtained.   Let me know please. If you cancel, please remove case with Noreene Larsson, notify MD and pt. Thanks so much.   Thank you,   Carolanne Grumbling RN BSN (Preoperative Assessment) Phone [phone] Ext [phone] Fax [phone] [email or web address]

## 2021-11-19 NOTE — Telephone Encounter (Signed)
Patient is stating that Stefanie [name] is needing a risk assessment for her knee surgery tomorrow at 10am. Patient states that Stefanie [name] just informed her of this information- waiting on fax to come over.   Debbie at [facility] can be reached at [phone].  Please call patient at [phone].

## 2021-11-19 NOTE — Telephone Encounter (Signed)
Spoke with Eunice Blase at [facility]. HRCT results 11/03/21 were visible for [facility].   LOV was 10/08/21 with Dr. Myrtha Mantis stated she would have  surgeon reach out to Dr. Isaiah Serge if needed. Nothing further at this time.

## 2021-11-21 NOTE — Progress Notes (Signed)
Office Visit Note  Patient: Stefanie [name]             Date of Birth: 1960/07/17           MRN: [id number]             PCP: Shon Baton, MD Referring: Shon Baton, MD Visit Date: 12/03/2021 Occupation: _0 @  Subjective:  Upcoming left knee surgery   History of Present Illness: Stefanie [name] is a 61 y.o. female with history of Sjogren's syndrome and osteoarthritis.  Patient is taking Imuran 50 mg 1 tablet in morning and half tablet at night.  She continues to tolerate Imuran without any side effects.  She plans on holding Imuran 1 week prior to her scheduled left knee arthroscopic surgery on 12/12/2021.  She will be cleared by Dr. Erlinda Hong after surgery prior to resuming.  Patient continues to experience pain and swelling in the left knee joint due to a torn meniscus.  She has difficulty ambulating due to her symptoms.  She also has some ongoing discomfort in her right knee joint.  She remains on colchicine 0.6 mg 1 tablet by mouth daily.  She experiences pain and stiffness in her hands especially first thing in the morning.  Her hand pain is alleviated by massage.  She has intermittent symptoms of Raynaud's but denies any digital ulcerations or signs of gangrene.  She continues to have chronic sicca symptoms.  She states that her eye dryness has been worse so she is having to use more frequent eyedrops.     Activities of Daily Living:  Patient reports morning stiffness for 15-20 minutes.   Patient Reports nocturnal pain.  Difficulty dressing/grooming: Denies Difficulty climbing stairs: Reports Difficulty getting out of chair: Reports Difficulty using hands for taps, buttons, cutlery, and/or writing: Reports  Review of Systems  Constitutional:  Positive for fatigue.  HENT:  Positive for mouth dryness. Negative for mouth sores.   Eyes:  Positive for dryness.  Respiratory:  Positive for shortness of breath.   Cardiovascular:  Positive for chest pain and palpitations.   Gastrointestinal:  Negative for blood in stool, constipation and diarrhea.  Endocrine: Negative for increased urination.  Genitourinary:  Negative for involuntary urination.  Musculoskeletal:  Positive for joint pain, joint pain, joint swelling, muscle weakness and morning stiffness. Negative for gait problem, myalgias, muscle tenderness and myalgias.  Skin:  Positive for color change. Negative for rash, hair loss and sensitivity to sunlight.  Allergic/Immunologic: Negative for susceptible to infections.  Neurological:  Negative for dizziness and headaches.  Hematological:  Negative for swollen glands.  Psychiatric/Behavioral:  Positive for sleep disturbance. Negative for depressed mood. The patient is not nervous/anxious.     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   Prolonged QT interval 07/28/2016   Primary osteoarthritis of both hands 05/11/2016   Primary osteoarthritis of both knees 05/11/2016   History of hypothyroidism 05/11/2016   History of migraine 05/11/2016   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, 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, 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, anticardiolipin antibody and positive rheumatoid factor 04/29/2016  ANA positive 04/29/2016   Rheumatoid factor positive 04/29/2016   Pseudogout 04/29/2016   Chondrocalcinosis 04/29/2016   High risk medication use 04/29/2016   Sjogren's syndrome with keratoconjunctivitis sicca ([place]) 04/29/2016   History of neutropenia 04/29/2016   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   Hyponatremia  12/12/2013   Fever 12/13/2012   Pleuritic chest pain 12/06/2012   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   Anxiety 12/06/2012   ANEMIA-IRON DEFICIENCY 01/08/2010   GASTRIC DILATION, ACUTE 01/08/2010   NAUSEA ALONE 01/08/2010   ABDOMINAL PAIN, LEFT UPPER QUADRANT 01/08/2010    Past Medical History:  Diagnosis Date   Anemia    Arthritis    Asthma    Diabetes insipidus ([place])    Fibromyalgia    Lupus ([place])    Mitral valve prolapse    PVC's (premature ventricular contractions)    SLE (systemic lupus erythematosus) ([place]) 12/06/2012   Thyroid disease     Family History  Problem Relation Age of Onset   Atrial fibrillation Mother    Dementia Mother    Diabetes Father    Heart disease Father    Hyperlipidemia Father    Hypertension Father    Stroke Father    Diabetes Sister    Kidney disease Sister        transplant   Heart disease Sister        open heart surgery    Pancreatic disease Sister        transplant    Cancer Maternal Grandmother    Diabetes Paternal Grandfather    Heart disease Paternal Grandfather    Hyperlipidemia Paternal Grandfather    Hypertension Paternal [employer] officer    Healthy Daughter    Healthy Son    Colon cancer Neg Hx    Esophageal cancer Neg Hx    Rectal cancer Neg Hx    Stomach cancer Neg Hx    Past Surgical History:  Procedure Laterality Date   COLONOSCOPY     PITUITARY EXCISION     TONSILLECTOMY     Social History   Social History Narrative   Right handed   Immunization History  Administered Date(s) Administered   Influenza Inj Mdck Quad Pf 01/21/2020   Influenza-Unspecified 09/28/2018   PFIZER(Purple Top)SARS-COV-2 Vaccination 11/17/2019, 12/13/2019     Objective: Vital Signs: BP 117/80 (BP Location: Left Arm, Patient Position: Sitting, Cuff Size: Normal)   Pulse 80   Resp 15   Ht 5' 7.5" (1.715 m)   Wt 142 lb 9.6 oz (64.7 kg)   LMP 04/09/2014 (Approximate)   BMI 22.00 kg/m    Physical Exam Vitals and nursing note  reviewed.  Constitutional:      Appearance: She is well-developed.  HENT:     Head: Normocephalic and atraumatic.  Eyes:     Conjunctiva/sclera: Conjunctivae normal.  Cardiovascular:     Rate and Rhythm: Normal rate and regular rhythm.     Heart sounds: Normal heart sounds.  Pulmonary:     Effort: Pulmonary effort is normal.     Breath sounds: Normal breath sounds.  Abdominal:     General: Bowel sounds are normal.     Palpations: Abdomen is soft.  Musculoskeletal:     Cervical back: Normal range of motion.  Skin:    General: Skin is warm and dry.     Capillary Refill: Capillary refill takes less than 2 seconds.  Neurological:     Mental Status: She is alert and oriented to person, place, and time.  Psychiatric:  Behavior: Behavior normal.      Musculoskeletal Exam: C-spine has limited range of motion.  Some midline spinal tenderness in the lumbar region.  Tenderness over the left SI joint.  Shoulder joints, elbow joints, wrist joints, MCPs, PIPs, DIPs have good range of motion with no synovitis.  PIP and DIP thickening consistent with osteoarthritis of both hands.  CMC joint thickening and prominence noted bilaterally.  Hip joints have good range of motion with no groin pain.  Painful range of motion of both knee joints with a moderate effusion in the left knee.  Ankle joints have good range of motion with no tenderness.  CDAI Exam: CDAI Score: -- Patient Global: --; Provider Global: -- Swollen: --; Tender: -- Joint Exam 12/03/2021   No joint exam has been documented for this visit   There is currently no information documented on the homunculus. Go to the Rheumatology activity and complete the homunculus joint exam.  Investigation: No additional findings.  Imaging: MM 3D SCREEN BREAST BILATERAL  Result Date: 11/06/2021 CLINICAL DATA:  Screening. EXAM: DIGITAL SCREENING BILATERAL MAMMOGRAM WITH TOMOSYNTHESIS AND CAD TECHNIQUE: Bilateral screening digital  craniocaudal and mediolateral oblique mammograms were obtained. Bilateral screening digital breast tomosynthesis was performed. The images were evaluated with computer-aided detection. Best images possible per technologist communication. COMPARISON:  Previous exam(s). ACR Breast Density Category b: There are scattered areas of fibroglandular density. FINDINGS: There are no findings suspicious for malignancy. IMPRESSION: No mammographic evidence of malignancy. A result letter of this screening mammogram will be mailed directly to the patient. RECOMMENDATION: Screening mammogram in one year. (Code:SM-B-01Y) BI-RADS CATEGORY  1: Negative. Electronically Signed   By: Valentino Saxon M.D.   On: 11/06/2021 11:44    Recent Labs: Lab Results  Component Value Date   WBC 2.9 (L) 09/01/2021   HGB 12.7 09/01/2021   PLT 213 09/01/2021   NA 137 09/01/2021   K 4.1 09/01/2021   CL 102 09/01/2021   CO2 30 09/01/2021   GLUCOSE 90 09/01/2021   BUN 18 09/01/2021   CREATININE 0.62 09/01/2021   BILITOT 0.6 09/01/2021   ALKPHOS 82 05/15/2020   AST 27 09/01/2021   ALT 16 09/01/2021   PROT 7.4 09/01/2021   PROT 7.4 09/01/2021   ALBUMIN 4.1 05/15/2020   CALCIUM 9.4 09/01/2021   GFRAA 102 04/24/2020   QFTBGOLDPLUS NEGATIVE 10/16/2019    Speciality Comments: PLQ Eye Exam 02/26/2020 WNL @ [facility] follow up in 1 year  Procedures:  No procedures performed Allergies: Iron sucrose, Morphine, Venofer [ferric oxide], Azithromycin, and Other     Assessment / Plan:     Visit Diagnoses: Sjogren's syndrome with keratoconjunctivitis sicca (HCC) - Positive ANA, positive Ro, positive La, positive RF sicca symptoms, inflammatory arthritis:  Patient continues to have chronic sicca symptoms.  She has been experiencing increased eye dryness which she attributes to weather changes.  She has had to increase her frequency of using refresh eyedrops. She has no signs of inflammatory arthritis at this time.   No synovitis noted.  She does have a moderate left knee joint effusion secondary to a meniscal tear.  She is scheduled for left knee arthroscopic surgery on 12/12/2021 by Dr. Erlinda Hong. Lab work from 09/01/2021 was reviewed today in the office: ANA remains positive 1: 640NS, Ro ab >8, La ab-, RF-, SPEP did not reveal any abnormal protein bands, ESR WNL, complements WNL, dsDNA negative.   She remains on Imuran 50 mg 1 tablet in the morning and half tablet in  the evening.  She is tolerating Imuran without any side effects and has not missed any doses recently.   She is not experiencing any new or worsening pulmonary symptoms.  Followed by Dr. Vaughan Browner. 11/03/21 high resolution chest CT: No evidence of progression when compared with September 24, 2020 exam.   She will remain on Imuran as prescribed.  She was advised to notify us if she develops any new or worsening symptoms.  She will follow-up in the office in 3 months or sooner if needed.  We will recheck autoimmune lab work at her upcoming follow-up visit.  High risk medication use - Imuran 50 mg 1 tablet in the morning and half tablet in the evening.  Continues to tolerate Imuran without any side effects. CBC and CMP were drawn on 09/01/2021.  She will be having updated CBC and CMP tomorrow as part of the perioperative work-up.  Future orders for CBC and CMP were placed today.- Plan: CBC with Differential/Platelet, COMPLETE METABOLIC PANEL WITH GFR No recent or recurrent infections. Patient plans on holding Imuran 1 week prior to her upcoming surgery on 12/12/2021 and will require clearance by Dr. Erlinda Hong after surgery to resume imuran.  Patient had an updated colonoscopy on 11/11/2021.  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  Patient will be having updated lab work tomorrow as part of the perioperative work-up. Order for CBC with diff placed today.   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 Vaughan Browner.  High-resolution chest CT ordered on 5/25/202. No new or worsening pulmonary symptoms.   Updated HR chest CT on 11/03/21-no progression.  Patient is considered low risk for any perioperative pulmonary complications per Dr. Matilde Bash note from 11/25/2021.  Primary osteoarthritis of both hands: She has PIP and DIP thickening consistent with osteoarthritis of both hands.  CMC joint prominence and thickening noted bilaterally.  Complete fist formation noted.  Discussed the importance of joint protection and muscle strengthening.  Her discomfort is alleviated by hand massage.  Discussed the importance of performing hand exercises daily.  Discussed the option of hand therapy in the future.  Primary osteoarthritis of both knees - X-rays of the right knee were consistent with moderate osteoarthritis and moderate chondromalacia patella on 01/23/2021.  She continues to have chronic pain in both knee joints. Warmth and swelling in the left knee noted on exam.  She is scheduled  Effusion, left knee:She continues to have warmth, swelling, and pain in the left knee joint limiting her mobility.  MRI of the left knee 09/27/2021 revealed large joint effusion with synovitis, advanced lateral tibiofemoral arthritis characterized by complex degenerative tear of the body of the lateral meniscus with meniscal extrusion and full-thickness cartilage defect.  Patient is scheduled for left knee arthroscopic surgery by Dr. Erlinda Hong on 12/12/2021. She plans on holding Imuran 1 week prior to surgery and will require clearance by Dr. Erlinda Hong after surgery prior to resuming imuran.   Chondromalacia patellae, right knee: She has good ROM of the right knee with some discomfort.   Chondrocalcinosis - She continues to take Colchicine 0.6 mg 1 capsule by mouth daily.  She is tolerating colchicine without any side effects.  She is not experiencing any signs or symptoms of a pseudogout flare at this time.  Cervical spondylosis  without myelopathy: C-spine has slight limited range of motion with lateral rotation.  No symptoms of radiculopathy.  Fibromyalgia: She experiences intermittent myalgias and muscle tenderness due  to fibromyalgia.  Discussed the importance of regular exercise and good sleep hygiene.  Other fatigue: Chronic, stable.  Other medical conditions are listed as follows:  Prolonged QT interval  History of migraine  Muscle cramping  History of diabetes insipidus  History of hypothyroidism  History of anxiety  Orders: Orders Placed This Encounter  Procedures   CBC with Differential/Platelet   COMPLETE METABOLIC PANEL WITH GFR   No orders of the defined types were placed in this encounter.    Follow-Up Instructions: Return in about 3 months (around 03/04/2022) for Sjogren's syndrome, Osteoarthritis.   Ofilia Neas, PA-C  Note - This record has been created using Dragon software.  Chart creation errors have been sought, but may not always  have been located. Such creation errors do not reflect on  the standard of medical care.

## 2021-11-25 ENCOUNTER — Encounter: Payer: Self-pay | Admitting: Pulmonary Disease

## 2021-11-25 NOTE — Progress Notes (Signed)
PCCM note  Request for peroperative pulmonary evaluation for Stefanie [name] Planned left knee arthroscopy and partial left meniscectomy under general anesthesia.  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  On Imuran, Plaquenil and colchicine per rheumatology.  Last seen in office on 10/08/2021 Symptomatically she is doing well with no dyspnea or respiratory impairment.  She does not require supplemental oxygen PFTs from 06/14/2020 reviewed with mild/minimal reduction in diffusion capacity [DLCO 77%] but overall stable compared to 2020.  She had a CT scan on 11/03/2021 showing stable interstitial lung disease with no progression  Overall she is at low risk for any perioperative pulmonary complications.  No absolute contraindication for planned surgery  Peri-operative Assessment of Pulmonary Risk for Non-Thoracic Surgery:  Respiratory complications generally occur in 1% of ASA Class I patients, 5% of ASA Class II and 10% of ASA Class III-IV patients These complications rarely result in mortality and iclude postoperative pneumonia, atelectasis, pulmonary embolism, ARDS and increased time requiring postoperative mechanical ventilation.  Overall, I recommend proceeding with the surgery if the risk for respiratory complications are outweighed by the potential benefits. This will need to be discussed between the patient and surgeon.  To reduce risks of respiratory complications, I recommend: --Pre- and post-operative incentive spirometry performed frequently while awake --Avoiding use of pancuronium during anesthesia.  Stefanie Garfinkel MD [facility] See Amion for pager  If no response to pager , please call [phone] until 7pm After 7:00 pm call Elink  [phone] 11/25/2021, 1:10 PM

## 2021-11-27 ENCOUNTER — Encounter: Payer: 59 | Admitting: Orthopaedic Surgery

## 2021-11-29 ENCOUNTER — Other Ambulatory Visit: Payer: Self-pay | Admitting: Rheumatology

## 2021-12-01 NOTE — Telephone Encounter (Signed)
Next Visit: 12/03/2021  Last Visit: 09/01/2021  Last Fill: 01/15/2021 (Mitigare) 04/16/2021 (Imuran)  DX: Sjogren's syndrome with keratoconjunctivitis sicca  Chondrocalcinosis   Current Dose per office note 09/01/2021: Imuran 75 mg by mouth daily,  Colchicine 0.6 mg 1 capsule by mouth daily.   Labs: 09/01/2021 White blood cell count is low at 2.9.  Absolute lymphocyte count remains low.   CMP within normal limits.    Patient to update her lab work at her upcoming appointment.   Okay to refill Colchicine and Mitigare?

## 2021-12-03 ENCOUNTER — Ambulatory Visit: Payer: 59 | Attending: Physician Assistant | Admitting: Physician Assistant

## 2021-12-03 ENCOUNTER — Encounter: Payer: Self-pay | Admitting: Physician Assistant

## 2021-12-03 VITALS — BP 117/80 | HR 80 | Resp 15 | Ht 67.5 in | Wt 142.6 lb

## 2021-12-03 DIAGNOSIS — M25462 Effusion, left knee: Secondary | ICD-10-CM

## 2021-12-03 DIAGNOSIS — M47812 Spondylosis without myelopathy or radiculopathy, cervical region: Secondary | ICD-10-CM

## 2021-12-03 DIAGNOSIS — M19041 Primary osteoarthritis, right hand: Secondary | ICD-10-CM

## 2021-12-03 DIAGNOSIS — Z8659 Personal history of other mental and behavioral disorders: Secondary | ICD-10-CM

## 2021-12-03 DIAGNOSIS — M797 Fibromyalgia: Secondary | ICD-10-CM | POA: Diagnosis not present

## 2021-12-03 DIAGNOSIS — M2241 Chondromalacia patellae, right knee: Secondary | ICD-10-CM

## 2021-12-03 DIAGNOSIS — M1120 Other chondrocalcinosis, unspecified site: Secondary | ICD-10-CM

## 2021-12-03 DIAGNOSIS — Z79899 Other long term (current) drug therapy: Secondary | ICD-10-CM | POA: Diagnosis not present

## 2021-12-03 DIAGNOSIS — M170 Bilateral primary osteoarthritis of knee: Secondary | ICD-10-CM | POA: Diagnosis not present

## 2021-12-03 DIAGNOSIS — Z8639 Personal history of other endocrine, nutritional and metabolic disease: Secondary | ICD-10-CM

## 2021-12-03 DIAGNOSIS — D702 Other drug-induced agranulocytosis: Secondary | ICD-10-CM | POA: Diagnosis not present

## 2021-12-03 DIAGNOSIS — Z8669 Personal history of other diseases of the nervous system and sense organs: Secondary | ICD-10-CM

## 2021-12-03 DIAGNOSIS — J842 Lymphoid interstitial pneumonia: Secondary | ICD-10-CM | POA: Diagnosis not present

## 2021-12-03 DIAGNOSIS — R252 Cramp and spasm: Secondary | ICD-10-CM

## 2021-12-03 DIAGNOSIS — M3501 Sicca syndrome with keratoconjunctivitis: Secondary | ICD-10-CM

## 2021-12-03 DIAGNOSIS — R5383 Other fatigue: Secondary | ICD-10-CM

## 2021-12-03 DIAGNOSIS — M19042 Primary osteoarthritis, left hand: Secondary | ICD-10-CM

## 2021-12-03 DIAGNOSIS — R9431 Abnormal electrocardiogram [ECG] [EKG]: Secondary | ICD-10-CM

## 2021-12-03 NOTE — Patient Instructions (Signed)
Hand Exercises Hand exercises can be helpful for almost anyone. These exercises can strengthen the hands, improve flexibility and movement, and increase blood flow to the hands. These results can make work and daily tasks easier. Hand exercises can be especially helpful for people who have joint pain from arthritis or have nerve damage from overuse (carpal tunnel syndrome). These exercises can also help people who have injured a hand. Exercises Most of these hand exercises are gentle stretching and motion exercises. It is usually safe to do them often throughout the day. Warming up your hands before exercise may help to reduce stiffness. You can do this with gentle massage or by placing your hands in warm water for 10-15 minutes. It is normal to feel some stretching, pulling, tightness, or mild discomfort as you begin new exercises. This will gradually improve. Stop an exercise right away if you feel sudden, severe pain or your pain gets worse. Ask your health care provider which exercises are best for you. Knuckle bend or "claw" fist  Stand or sit with your arm, hand, and all five fingers pointed straight up. Make sure to keep your wrist straight during the exercise. Gently bend your fingers down toward your palm until the tips of your fingers are touching the top of your palm. Keep your big knuckle straight and just bend the small knuckles in your fingers. Hold this position for __________ seconds. Straighten (extend) your fingers back to the starting position. Repeat this exercise 5-10 times with each hand. Full finger fist  Stand or sit with your arm, hand, and all five fingers pointed straight up. Make sure to keep your wrist straight during the exercise. Gently bend your fingers into your palm until the tips of your fingers are touching the middle of your palm. Hold this position for __________ seconds. Extend your fingers back to the starting position, stretching every joint fully. Repeat  this exercise 5-10 times with each hand. Straight fist Stand or sit with your arm, hand, and all five fingers pointed straight up. Make sure to keep your wrist straight during the exercise. Gently bend your fingers at the big knuckle, where your fingers meet your hand, and the middle knuckle. Keep the knuckle at the tips of your fingers straight and try to touch the bottom of your palm. Hold this position for __________ seconds. Extend your fingers back to the starting position, stretching every joint fully. Repeat this exercise 5-10 times with each hand. Tabletop  Stand or sit with your arm, hand, and all five fingers pointed straight up. Make sure to keep your wrist straight during the exercise. Gently bend your fingers at the big knuckle, where your fingers meet your hand, as far down as you can while keeping the small knuckles in your fingers straight. Think of forming a tabletop with your fingers. Hold this position for __________ seconds. Extend your fingers back to the starting position, stretching every joint fully. Repeat this exercise 5-10 times with each hand. Finger spread  Place your hand flat on a table with your palm facing down. Make sure your wrist stays straight as you do this exercise. Spread your fingers and thumb apart from each other as far as you can until you feel a gentle stretch. Hold this position for __________ seconds. Bring your fingers and thumb tight together again. Hold this position for __________ seconds. Repeat this exercise 5-10 times with each hand. Making circles  Stand or sit with your arm, hand, and all five fingers pointed   straight up. Make sure to keep your wrist straight during the exercise. Make a circle by touching the tip of your thumb to the tip of your index finger. Hold for __________ seconds. Then open your hand wide. Repeat this motion with your thumb and each finger on your hand. Repeat this exercise 5-10 times with each hand. Thumb  motion  Sit with your forearm resting on a table and your wrist straight. Your thumb should be facing up toward the ceiling. Keep your fingers relaxed as you move your thumb. Lift your thumb up as high as you can toward the ceiling. Hold for __________ seconds. Bend your thumb across your palm as far as you can, reaching the tip of your thumb for the small finger (pinkie) side of your palm. Hold for __________ seconds. Repeat this exercise 5-10 times with each hand. Grip strengthening  Hold a stress ball or other soft ball in the middle of your hand. Slowly increase the pressure, squeezing the ball as much as you can without causing pain. Think of bringing the tips of your fingers into the middle of your palm. All of your finger joints should bend when doing this exercise. Hold your squeeze for __________ seconds, then relax. Repeat this exercise 5-10 times with each hand. Contact a health care provider if: Your hand pain or discomfort gets much worse when you do an exercise. Your hand pain or discomfort does not improve within 2 hours after you exercise. If you have any of these problems, stop doing these exercises right away. Do not do them again unless your health care provider says that you can. Get help right away if: You develop sudden, severe hand pain or swelling. If this happens, stop doing these exercises right away. Do not do them again unless your health care provider says that you can. This information is not intended to replace advice given to you by your health care provider. Make sure you discuss any questions you have with your health care provider. Document Revised: 06/13/2020 Document Reviewed: 06/13/2020 [employer]vier Patient Education  2023 [employer]  

## 2021-12-03 NOTE — Pre-Procedure Instructions (Signed)
Surgical Instructions    Your procedure is scheduled on Friday, October 6th.  Report to [facility] Main Entrance "A" at 05:30 A.M., then check in with the Admitting office.  Call this number if you have problems the morning of surgery:  [phone]   If you have any questions prior to your surgery date call [phone]: Open Monday-Friday 8am-4pm    Remember:  Do not eat after midnight the night before your surgery  You may drink clear liquids until 04:30 AM the morning of your surgery.   Clear liquids allowed are: Water, Non-Citrus Juices (without pulp), Carbonated Beverages, Clear Tea, Black Coffee Only (NO MILK, CREAM OR POWDERED CREAMER of any kind), and Gatorade.  Patient Instructions  The night before surgery:  No food after midnight. ONLY clear liquids after midnight  The day of surgery (if you do NOT have diabetes):  Drink ONE (1) Pre-Surgery Clear Ensure by 04:30 AM the morning of surgery. Drink in one sitting. Do not sip.  This drink was given to you during your hospital  pre-op appointment visit.  Nothing else to drink after completing the  Pre-Surgery Clear Ensure.          If you have questions, please contact your surgeon's office.     Take these medicines the morning of surgery with A SIP OF WATER  levothyroxine (SYNTHROID, LEVOTHROID)  MITIGARE  If needed: acetaminophen (TYLENOL) albuterol (PROVENTIL HFA;VENTOLIN HFA)- if needed, bring with you on day of surgery Polyvinyl Alcohol-Povidone (REFRESH OP)  Follow your surgeon's instructions on whether you need to stop azaTHIOprine [facility]).  If no instructions were given by your surgeon then you will need to call the office to get those instructions.     As of today, STOP taking any Aspirin (unless otherwise instructed by your surgeon) Aleve, Naproxen, Ibuprofen, Motrin, Advil, Goody's, BC's, all herbal medications, fish oil, and all vitamins.                     Do NOT Smoke (Tobacco/Vaping) for 24 hours  prior to your procedure.  If you use a CPAP at night, you may bring your mask/headgear for your overnight stay.   Contacts, glasses, piercing's, hearing aid's, dentures or partials may not be worn into surgery, please bring cases for these belongings.    For patients admitted to the hospital, discharge time will be determined by your treatment team.   Patients discharged the day of surgery will not be allowed to drive home, and someone needs to stay with them for 24 hours.  SURGICAL WAITING ROOM VISITATION Patients having surgery or a procedure may have no more than 2 support people in the waiting area - these visitors may rotate.   Children under the age of 66 must have an adult with them who is not the patient. If the patient needs to stay at the hospital during part of their recovery, the visitor guidelines for inpatient rooms apply. Pre-op nurse will coordinate an appropriate time for 1 support person to accompany patient in pre-op.  This support person may not rotate.   Please refer to the [facility] website for the visitor guidelines for Inpatients (after your surgery is over and you are in a regular room).    Special instructions:   [place]- Preparing For Surgery  Before surgery, you can play an important role. Because skin is not sterile, your skin needs to be as free of germs as possible. You can reduce the number of germs on  your skin by washing with CHG (chlorahexidine gluconate) Soap before surgery.  CHG is an antiseptic cleaner which kills germs and bonds with the skin to continue killing germs even after washing.    Oral Hygiene is also important to reduce your risk of infection.  Remember - BRUSH YOUR TEETH THE MORNING OF SURGERY WITH YOUR REGULAR TOOTHPASTE  Please do not use if you have an allergy to CHG or antibacterial soaps. If your skin becomes reddened/irritated stop using the CHG.  Do not shave (including legs and underarms) for at least 48 hours prior to first  CHG shower. It is OK to shave your face.  Please follow these instructions carefully.   Shower the NIGHT BEFORE SURGERY and the MORNING OF SURGERY  If you chose to wash your hair, wash your hair first as usual with your normal shampoo.  After you shampoo, rinse your hair and body thoroughly to remove the shampoo.  Use CHG Soap as you would any other liquid soap. You can apply CHG directly to the skin and wash gently with a scrungie or a clean washcloth.   Apply the CHG Soap to your body ONLY FROM THE NECK DOWN.  Do not use on open wounds or open sores. Avoid contact with your eyes, ears, mouth and genitals (private parts). Wash Face and genitals (private parts)  with your normal soap.   Wash thoroughly, paying special attention to the area where your surgery will be performed.  Thoroughly rinse your body with warm water from the neck down.  DO NOT shower/wash with your normal soap after using and rinsing off the CHG Soap.  Pat yourself dry with a CLEAN TOWEL.  Wear CLEAN PAJAMAS to bed the night before surgery  Place CLEAN SHEETS on your bed the night before your surgery  DO NOT SLEEP WITH PETS.   Day of Surgery: Take a shower with CHG soap. Do not wear jewelry or makeup Do not wear lotions, powders, perfumes, or deodorant. Do not shave 48 hours prior to surgery.   Do not bring valuables to the hospital. [facility] is not responsible for any belongings or valuables. Do not wear nail polish, gel polish, artificial nails, or any other type of covering on natural nails (fingers and toes) If you have artificial nails or gel coating that need to be removed by a nail salon, please have this removed prior to surgery. Artificial nails or gel coating may interfere with anesthesia's ability to adequately monitor your vital signs. Wear Clean/Comfortable clothing the morning of surgery Remember to brush your teeth WITH YOUR REGULAR TOOTHPASTE.   Please read over the following fact sheets  that you were given.    If you received a COVID test during your pre-op visit  it is requested that you wear a mask when out in public, stay away from anyone that may not be feeling well and notify your surgeon if you develop symptoms. If you have been in contact with anyone that has tested positive in the last 10 days please notify you surgeon.

## 2021-12-04 ENCOUNTER — Encounter (HOSPITAL_COMMUNITY): Payer: Self-pay

## 2021-12-04 ENCOUNTER — Encounter (HOSPITAL_COMMUNITY)
Admission: RE | Admit: 2021-12-04 | Discharge: 2021-12-04 | Disposition: A | Discharge status: Home / Self Care | Payer: 59 | Source: Ambulatory Visit | Attending: Orthopaedic Surgery | Admitting: Orthopaedic Surgery

## 2021-12-04 ENCOUNTER — Other Ambulatory Visit: Payer: Self-pay

## 2021-12-04 VITALS — BP 132/77 | HR 82 | Temp 98.2°F | Resp 17 | Ht 67.0 in | Wt 144.0 lb

## 2021-12-04 DIAGNOSIS — M1712 Unilateral primary osteoarthritis, left knee: Secondary | ICD-10-CM | POA: Insufficient documentation

## 2021-12-04 DIAGNOSIS — E232 Diabetes insipidus: Secondary | ICD-10-CM | POA: Diagnosis not present

## 2021-12-04 DIAGNOSIS — M3500 Sicca syndrome, unspecified: Secondary | ICD-10-CM | POA: Diagnosis not present

## 2021-12-04 DIAGNOSIS — I700 Atherosclerosis of aorta: Secondary | ICD-10-CM | POA: Insufficient documentation

## 2021-12-04 DIAGNOSIS — J849 Interstitial pulmonary disease, unspecified: Secondary | ICD-10-CM | POA: Insufficient documentation

## 2021-12-04 DIAGNOSIS — E039 Hypothyroidism, unspecified: Secondary | ICD-10-CM | POA: Diagnosis not present

## 2021-12-04 DIAGNOSIS — M329 Systemic lupus erythematosus, unspecified: Secondary | ICD-10-CM | POA: Diagnosis not present

## 2021-12-04 DIAGNOSIS — M797 Fibromyalgia: Secondary | ICD-10-CM | POA: Insufficient documentation

## 2021-12-04 DIAGNOSIS — M25469 Effusion, unspecified knee: Secondary | ICD-10-CM | POA: Insufficient documentation

## 2021-12-04 DIAGNOSIS — M659 Synovitis and tenosynovitis, unspecified: Secondary | ICD-10-CM | POA: Diagnosis not present

## 2021-12-04 DIAGNOSIS — X58XXXA Exposure to other specified factors, initial encounter: Secondary | ICD-10-CM | POA: Diagnosis not present

## 2021-12-04 DIAGNOSIS — R918 Other nonspecific abnormal finding of lung field: Secondary | ICD-10-CM | POA: Diagnosis not present

## 2021-12-04 DIAGNOSIS — Z01818 Encounter for other preprocedural examination: Secondary | ICD-10-CM

## 2021-12-04 DIAGNOSIS — Z01812 Encounter for preprocedural laboratory examination: Secondary | ICD-10-CM | POA: Diagnosis not present

## 2021-12-04 DIAGNOSIS — S83282A Other tear of lateral meniscus, current injury, left knee, initial encounter: Secondary | ICD-10-CM | POA: Insufficient documentation

## 2021-12-04 HISTORY — DX: Other intervertebral disc degeneration, lumbar region without mention of lumbar back pain or lower extremity pain: M51.369

## 2021-12-04 HISTORY — DX: Other specified postprocedural states: Z98.890

## 2021-12-04 HISTORY — DX: Headache, unspecified: R51.9

## 2021-12-04 HISTORY — DX: Hypothyroidism, unspecified: E03.9

## 2021-12-04 HISTORY — DX: Lymphoid interstitial pneumonia: J84.2

## 2021-12-04 LAB — CBC
HCT: 39.1 % (ref 36.0–46.0)
Hemoglobin: 12.9 g/dL (ref 12.0–15.0)
MCH: 29.5 pg (ref 26.0–34.0)
MCHC: 33 g/dL (ref 30.0–36.0)
MCV: 89.3 fL (ref 80.0–100.0)
Platelets: 222 10*3/uL (ref 150–400)
RBC: 4.38 MIL/uL (ref 3.87–5.11)
RDW: 12.9 % (ref 11.5–15.5)
WBC: 3.9 10*3/uL — ABNORMAL LOW (ref 4.0–10.5)
nRBC: 0 % (ref 0.0–0.2)

## 2021-12-04 LAB — BASIC METABOLIC PANEL
Anion gap: 9 (ref 5–15)
BUN: 14 mg/dL (ref 8–23)
CO2: 28 mmol/L (ref 22–32)
Calcium: 9.5 mg/dL (ref 8.9–10.3)
Chloride: 102 mmol/L (ref 98–111)
Creatinine, Ser: 0.72 mg/dL (ref 0.44–1.00)
GFR, Estimated: >60 mL/min (ref >60)
Glucose, Bld: 101 mg/dL — ABNORMAL HIGH (ref 70–99)
Potassium: 4.4 mmol/L (ref 3.5–5.1)
Sodium: 139 mmol/L (ref 135–145)

## 2021-12-04 NOTE — Progress Notes (Signed)
PCP - Dr. Shon Baton Cardiologist - Dr. Rex Kras  PPM/ICD - denies   Chest x-ray - 03/26/21 EKG - 09/11/21 Stress Test - 05/08/16 ECHO - 09/04/21 Cardiac Cath - denies  Sleep Study - 2-3 years ago, OSA- CPAP - n/a  DM- denies  ASA/Blood Thinner Instructions: n/a   ERAS Protcol - yes PRE-SURGERY Ensure given at PAT  COVID TEST- n/a   Anesthesia review: yes, pt sees pulmonologist and cardiac hx  Patient denies shortness of breath, fever, cough and chest pain at PAT appointment   All instructions explained to the patient, with a verbal understanding of the material. Patient agrees to go over the instructions while at home for a better understanding. The opportunity to ask questions was provided.

## 2021-12-05 NOTE — Progress Notes (Signed)
Anesthesia Chart Review:  Case: [id number] Date/Time: 12/12/21 0700   Procedure: LEFT KNEE ARTHROSCOPY, PARTIAL LATERAL MENISCECTOMY (Left: Knee)   Anesthesia type: General   Pre-op diagnosis: left knee lateral meniscal tear   Location: [facility] OR ROOM 05 / [place] OR   Surgeons: Leandrew Koyanagi, MD       DISCUSSION: Patient is a 61 year old female scheduled for the above procedure.  History includes never smoker, post-operative N/V, lupus (SLE), Sjogren's Syndrome (with lymphocytic interstitial pneumonia, keratoconjunctivitis sicca), diabetes insipidus (s/p pituitary tumor resection 1993), MVP (mild MR 08/2021 echo), PVCs, hypothyroidism, fibromyalgia.   - Pulmonology preoperative input 11/25/2021 with Dr. 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  On Imuran, Plaquenil and colchicine per rheumatology.  Last seen in office on 10/08/2021 Symptomatically she is doing well with no dyspnea or respiratory impairment.  She does not require supplemental oxygen PFTs from 06/14/2020 reviewed with mild/minimal reduction in diffusion capacity [DLCO 77%] but overall stable compared to 2020.  She had a CT scan on 11/03/2021 showing stable interstitial lung disease with no progression   Overall she is at low risk for any perioperative pulmonary complications.  No absolute contraindication for planned surgery... Overall, I recommend proceeding with the surgery if the risk for respiratory complications are outweighed by the potential benefits. This will need to be discussed between the patient and surgeon.   To reduce risks of respiratory complications, I recommend: --Pre- and post-operative incentive spirometry performed frequently while awake --Avoiding use of pancuronium during anesthesia."   - Last rheumatology follow-up was on 12/03/2021 with Hazel Sams, PA-C and notes, "She plans on holding Imuran 1 week prior to her scheduled left knee arthroscopic surgery  on 12/12/2021.  She will be cleared by Dr. Erlinda Hong after surgery prior to resuming."  59-month follow-up planned.   - She also has had cardiology evaluation for dyspnea at [facility]. Last visit 09/11/21 with Dr. Terri Skains.  She was doing overall well from a cardiovascular standpoint.  She reported intermittent palpitations and nonspecific precordial discomfort which was self limiting, lasting less than a minute and was not exertional or substernal. She had a low risk stress test in 2018. Echo in 08/2021 showed normal LV systolic function, EF 25-36%, mild MR (previously moderate), trivial TR, and "no significant RVSP to suggest pulmonary hypertension."  Clinically without anginal discomfort or heart failure symptoms.  And felt her overall functional capacity was "relatively stable."  No additional cardiovascular testing warranted at that time.  They discussed follow-up in 3 years or sooner if needed.  Anesthesia team to evaluate on the day of surgery.    VS: BP 132/77   Pulse 82   Temp 36.8 C (Oral)   Resp 17   Ht 5\' 7"  (1.702 m)   Wt 65.3 kg   LMP 04/09/2014 (Approximate)   SpO2 100%   BMI 22.55 kg/m   PROVIDERS: Shon Baton, MD is PCP  Rex Kras, DO is cardiologist  Marshell Garfinkel, MD is pulmonologist Bo Merino, MD is rheumatologist Metta Clines, DO is neurologist   LABS: Labs reviewed: Acceptable for surgery. (all labs ordered are listed, but only abnormal results are displayed)  Labs Reviewed  BASIC METABOLIC PANEL - Abnormal; Notable for the following components:      Result Value   Glucose, Bld 101 (*)    All other components within normal limits  CBC - Abnormal; Notable for the following components:   WBC 3.9 (*)    All other components  within normal limits    OTHER: PFTs 06/14/20: FVC 3.54 [92%], FEV1 2.77 [93%], F/F 78, DLCO 17.81 [77%] Minimal diffusion defect  Home Sleep Study 08/05/18: Overall Apnea hypopnea Index at 4.6/h was too mild to recommend   intervention. REM sleep accentuated the AHI to 8.6/h.  The  Patient slept on her left side only. There was no Hypoxemia noted  and a moderate degree of tachy-bradycardia was seen.    IMAGES: CT Chest (high resolution) 11/04/21: IMPRESSION: 1. Scattered thin-walled cyst which are most pronounced in the lower lungs and multiple small solid pulmonary nodules, compatible with lymphoid interstitial pneumonia. No evidence of progression when compared with September 24, 2020 exam. Findings are suggestive of an alternative diagnosis (not UIP) per consensus guidelines: Diagnosis of Idiopathic Pulmonary Fibrosis: An Official ATS/ERS/JRS/ALAT Clinical Practice Guideline. [place], Iss 5, [phone], Nov 07 2016. 2. Moderate bilateral air trapping, findings can be seen in the setting of small airways disease. 3. Mild calcifications of the aortic valve, which can be seen the setting of aortic sclerosis or stenosis. Correlate with echocardiography. 4. Mild aortic Atherosclerosis (ICD10-I70.0).   MRI left knee 09/27/21: IMPRESSION: 1. Advanced lateral tibiofemoral osteoarthritis characterized by complex degenerative tear of the body of the lateral meniscus with meniscal extrusion, full-thickness cartilage defect at the weight-bearing area with subchondral edema and cystic changes and bulky marginal osteophytes. 2. Mild-to-moderate medial tibiofemoral and patellofemoral osteoarthritis. 3.  Large knee joint effusion with synovitis. 4.  Chronic sprain of the medial and lateral collateral ligament.   EKG: 09/11/2021: Normal sinus rhythm, 71 bpm, without underlying ischemia injury pattern.   CV: Echocardiogram: 09/04/2021: Normal LV systolic function with visual EF 60-65%. Left ventricle cavity is normal in size. Normal left ventricular wall thickness. Normal global wall motion. Normal diastolic filling pattern, normal LAP. Mild (Grade I) mitral regurgitation. Compared to  03/04/2020 Moderate MR is now mild, Mild TR is now trivial, otherwise no significant change.   Stress Testing:  Lexiscan 05/08/2016: Nuclear stress EF: 64%. There was no ST segment deviation noted during stress. Defect 1: There is a medium defect of moderate severity present in the mid anterior and apical anterior location. The study is normal. This is a low risk study. The left ventricular ejection fraction is normal (55-65%) Low risk stress nuclear study with breast attenuation; no ischemia; EF 64 with normal wall motion.    30-day event monitor 6/17-7/16/2020: Normal sinus rhythm. One auto detected event occurred on day 30 at 1647 for sinus tachycardia at 149 bpm. No patient triggered events occurred. Fastest heart rate was 149 bpm. No A. fib or SVT was noted.   Past Medical History:  Diagnosis Date   Anemia    Arthritis    Asthma    DDD (degenerative disc disease), lumbar    Diabetes insipidus ([place])    Fibromyalgia    Headache    hx of migraines   Hypothyroidism    Lupus ([place])    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[place])    secondary to Sjogren's syndrome   Mitral valve prolapse    pt had echo on 09/04/21   PONV (postoperative nausea and vomiting)    PVC's (premature ventricular contractions)    SLE (systemic lupus erythematosus) ([place]) 12/06/2012   Thyroid disease     Past Surgical History:  Procedure Laterality Date   COLONOSCOPY     PITUITARY EXCISION  1993   TONSILLECTOMY      MEDICATIONS:  acetaminophen (TYLENOL) 500 MG  tablet   albuterol (PROVENTIL HFA;VENTOLIN HFA) 108 (90 BASE) MCG/ACT inhaler   azaTHIOprine (IMURAN) 50 MG tablet   HYDROcodone-acetaminophen (NORCO) 5-325 MG tablet   levothyroxine (SYNTHROID, LEVOTHROID) 125 MCG tablet   MITIGARE 0.6 MG CAPS   Polyvinyl Alcohol-Povidone (REFRESH OP)   No current facility-administered medications for this encounter.    Myra Gianotti, PA-C Surgical Short Stay/Anesthesiology [facility] Phone [phone] [facility] Phone [phone] 12/05/2021 4:49 PM

## 2021-12-05 NOTE — Anesthesia Preprocedure Evaluation (Addendum)
Anesthesia Evaluation  Patient identified by MRN, date of birth, ID band Patient awake    Reviewed: Allergy & Precautions, H&P , NPO status , Patient's Chart, lab work & pertinent test results  History of Anesthesia Complications (+) PONV and history of anesthetic complications  Airway Mallampati: II   Neck ROM: full    Dental   Pulmonary asthma ,    breath sounds clear to auscultation       Cardiovascular negative cardio ROS   Rhythm:regular Rate:Normal     Neuro/Psych  Headaches, Anxiety  Neuromuscular disease    GI/Hepatic   Endo/Other  Hypothyroidism   Renal/GU      Musculoskeletal  (+) Arthritis , Fibromyalgia -  Abdominal   Peds  Hematology   Anesthesia Other Findings   Reproductive/Obstetrics                             Anesthesia Physical Anesthesia Plan  ASA: 2  Anesthesia Plan: General   Post-op Pain Management:    Induction: Intravenous  PONV Risk Score and Plan: 4 or greater and Ondansetron, Dexamethasone, Midazolam, Treatment may vary due to age or medical condition and Scopolamine patch - Pre-op  Airway Management Planned: LMA  Additional Equipment:   Intra-op Plan:   Post-operative Plan: Extubation in OR  Informed Consent: I have reviewed the patients History and Physical, chart, labs and discussed the procedure including the risks, benefits and alternatives for the proposed anesthesia with the patient or authorized representative who has indicated his/her understanding and acceptance.     Dental advisory given  Plan Discussed with: CRNA, Anesthesiologist and Surgeon  Anesthesia Plan Comments: (PAT note written 12/05/2021 by Myra Gianotti, PA-C.   )       Anesthesia Quick Evaluation

## 2021-12-05 NOTE — Progress Notes (Signed)
Pt emailed me stating "I had my pre-op appointment yesterday afternoon with you.  I forgot to ask you about taking OTC meds for sinus allergies.  I feel like I'm getting some sinus yuck and wanted to try to nip it before next week. I didn't want to take anything with asking you first."  James, APP, notified and advised on how to respond. Response to pt: " If this does not rapidly improve, or things worsen you will need to be evaluated via urgent care or your primary care provider to avoid any possibility of your surgery being cancelled. OTC meds are okay. Just don't take anything on the day of surgery that is a decongestant stimulant (with things like pseudoephedrine) or anything that may make your heart rate increase. Let me know if you have further questions."

## 2021-12-07 ENCOUNTER — Other Ambulatory Visit: Payer: Self-pay | Admitting: Physician Assistant

## 2021-12-12 ENCOUNTER — Encounter (HOSPITAL_COMMUNITY)
Admission: RE | Disposition: A | Discharge status: Home / Self Care | Payer: Self-pay | Source: Home / Self Care | Attending: Orthopaedic Surgery

## 2021-12-12 ENCOUNTER — Ambulatory Visit (HOSPITAL_COMMUNITY): Payer: 59 | Admitting: Vascular Surgery

## 2021-12-12 ENCOUNTER — Other Ambulatory Visit: Payer: Self-pay

## 2021-12-12 ENCOUNTER — Ambulatory Visit (HOSPITAL_BASED_OUTPATIENT_CLINIC_OR_DEPARTMENT_OTHER): Payer: 59 | Admitting: Anesthesiology

## 2021-12-12 ENCOUNTER — Ambulatory Visit (HOSPITAL_COMMUNITY)
Admission: RE | Admit: 2021-12-12 | Discharge: 2021-12-12 | Disposition: A | Discharge status: Home / Self Care | Payer: 59 | Attending: Orthopaedic Surgery | Admitting: Orthopaedic Surgery

## 2021-12-12 ENCOUNTER — Encounter (HOSPITAL_COMMUNITY): Payer: Self-pay | Admitting: Orthopaedic Surgery

## 2021-12-12 ENCOUNTER — Encounter: Payer: Self-pay | Admitting: Orthopaedic Surgery

## 2021-12-12 DIAGNOSIS — J45909 Unspecified asthma, uncomplicated: Secondary | ICD-10-CM | POA: Insufficient documentation

## 2021-12-12 DIAGNOSIS — S83282A Other tear of lateral meniscus, current injury, left knee, initial encounter: Secondary | ICD-10-CM | POA: Diagnosis not present

## 2021-12-12 DIAGNOSIS — E039 Hypothyroidism, unspecified: Secondary | ICD-10-CM | POA: Insufficient documentation

## 2021-12-12 DIAGNOSIS — X58XXXA Exposure to other specified factors, initial encounter: Secondary | ICD-10-CM | POA: Diagnosis not present

## 2021-12-12 DIAGNOSIS — M94262 Chondromalacia, left knee: Secondary | ICD-10-CM | POA: Insufficient documentation

## 2021-12-12 DIAGNOSIS — M1990 Unspecified osteoarthritis, unspecified site: Secondary | ICD-10-CM | POA: Insufficient documentation

## 2021-12-12 DIAGNOSIS — M797 Fibromyalgia: Secondary | ICD-10-CM | POA: Insufficient documentation

## 2021-12-12 DIAGNOSIS — F419 Anxiety disorder, unspecified: Secondary | ICD-10-CM | POA: Diagnosis not present

## 2021-12-12 HISTORY — PX: KNEE ARTHROSCOPY WITH MENISCAL REPAIR: SHX5653

## 2021-12-12 SURGERY — ARTHROSCOPY, KNEE, WITH MENISCUS REPAIR
Anesthesia: General | Site: Knee | Laterality: Left

## 2021-12-12 MED ORDER — CHLORHEXIDINE GLUCONATE 0.12 % MT SOLN
15.0000 mL | Freq: Once | OROMUCOSAL | Status: AC
  Administered 2021-12-12: 15 mL via OROMUCOSAL
  Filled 2021-12-12: qty 15

## 2021-12-12 MED ORDER — FENTANYL CITRATE (PF) 100 MCG/2ML IJ SOLN: 25.0000 ug | INTRAMUSCULAR | Status: DC | PRN

## 2021-12-12 MED ORDER — MIDAZOLAM HCL 2 MG/2ML IJ SOLN
INTRAMUSCULAR | Status: AC
  Filled 2021-12-12: qty 2

## 2021-12-12 MED ORDER — CEFAZOLIN SODIUM-DEXTROSE 2-4 GM/100ML-% IV SOLN
2.0000 g | INTRAVENOUS | Status: AC
  Administered 2021-12-12: 2 g via INTRAVENOUS
  Filled 2021-12-12: qty 100

## 2021-12-12 MED ORDER — SCOPOLAMINE 1 MG/3DAYS TD PT72
MEDICATED_PATCH | TRANSDERMAL | Status: AC
  Filled 2021-12-12: qty 1

## 2021-12-12 MED ORDER — LIDOCAINE 2% (20 MG/ML) 5 ML SYRINGE
INTRAMUSCULAR | Status: AC
  Filled 2021-12-12: qty 5

## 2021-12-12 MED ORDER — FENTANYL CITRATE (PF) 250 MCG/5ML IJ SOLN
INTRAMUSCULAR | Status: AC
  Filled 2021-12-12: qty 5

## 2021-12-12 MED ORDER — LACTATED RINGERS IV SOLN: INTRAVENOUS | Status: DC

## 2021-12-12 MED ORDER — SCOPOLAMINE 1 MG/3DAYS TD PT72
MEDICATED_PATCH | TRANSDERMAL | Status: DC | PRN
  Administered 2021-12-12: 1 via TRANSDERMAL

## 2021-12-12 MED ORDER — MIDAZOLAM HCL 2 MG/2ML IJ SOLN
INTRAMUSCULAR | Status: DC | PRN
  Administered 2021-12-12: 2 mg via INTRAVENOUS

## 2021-12-12 MED ORDER — DEXAMETHASONE SODIUM PHOSPHATE 10 MG/ML IJ SOLN
INTRAMUSCULAR | Status: AC
  Filled 2021-12-12: qty 1

## 2021-12-12 MED ORDER — ONDANSETRON HCL 4 MG/2ML IJ SOLN
INTRAMUSCULAR | Status: DC | PRN
  Administered 2021-12-12: 4 mg via INTRAVENOUS

## 2021-12-12 MED ORDER — BUPIVACAINE HCL (PF) 0.25 % IJ SOLN
INTRAMUSCULAR | Status: AC
  Filled 2021-12-12: qty 30

## 2021-12-12 MED ORDER — BUPIVACAINE HCL (PF) 0.25 % IJ SOLN
INTRAMUSCULAR | Status: DC | PRN
  Administered 2021-12-12: 20 mL

## 2021-12-12 MED ORDER — PHENYLEPHRINE HCL-NACL 20-0.9 MG/250ML-% IV SOLN
INTRAVENOUS | Status: DC | PRN
  Administered 2021-12-12: 30 ug/min via INTRAVENOUS

## 2021-12-12 MED ORDER — FENTANYL CITRATE (PF) 250 MCG/5ML IJ SOLN
INTRAMUSCULAR | Status: DC | PRN
  Administered 2021-12-12: 25 ug via INTRAVENOUS

## 2021-12-12 MED ORDER — PROPOFOL 10 MG/ML IV BOLUS
INTRAVENOUS | Status: AC
  Filled 2021-12-12: qty 20

## 2021-12-12 MED ORDER — LIDOCAINE 2% (20 MG/ML) 5 ML SYRINGE
INTRAMUSCULAR | Status: DC | PRN
  Administered 2021-12-12: 60 mg via INTRAVENOUS

## 2021-12-12 MED ORDER — OXYCODONE HCL 5 MG/5ML PO SOLN: 5.0000 mg | Freq: Once | ORAL | Status: DC | PRN

## 2021-12-12 MED ORDER — ORAL CARE MOUTH RINSE: 15.0000 mL | Freq: Once | OROMUCOSAL | Status: AC

## 2021-12-12 MED ORDER — DEXAMETHASONE SODIUM PHOSPHATE 10 MG/ML IJ SOLN
INTRAMUSCULAR | Status: DC | PRN
  Administered 2021-12-12: 10 mg via INTRAVENOUS

## 2021-12-12 MED ORDER — SODIUM CHLORIDE 0.9 % IR SOLN
Status: DC | PRN
  Administered 2021-12-12: 6000 mL

## 2021-12-12 MED ORDER — OXYCODONE HCL 5 MG PO TABS: 5.0000 mg | ORAL_TABLET | Freq: Once | ORAL | Status: DC | PRN

## 2021-12-12 MED ORDER — ONDANSETRON HCL 4 MG/2ML IJ SOLN
INTRAMUSCULAR | Status: AC
  Filled 2021-12-12: qty 2

## 2021-12-12 MED ORDER — PROPOFOL 10 MG/ML IV BOLUS
INTRAVENOUS | Status: DC | PRN
  Administered 2021-12-12: 200 mg via INTRAVENOUS

## 2021-12-12 SURGICAL SUPPLY — 45 items
BANDAGE ESMARK 6X9 LF (GAUZE/BANDAGES/DRESSINGS) ×1 IMPLANT
BLADE CLIPPER SURG (BLADE) IMPLANT
BLADE EXCALIBUR 4.0X13 (MISCELLANEOUS) IMPLANT
BLADE SHAVER TORPEDO 4X13 (MISCELLANEOUS) IMPLANT
BLADE SURG 11 STRL SS (BLADE) IMPLANT
BNDG CMPR 9X6 STRL LF SNTH (GAUZE/BANDAGES/DRESSINGS) ×1
BNDG ELASTIC 6X5.8 VLCR STR LF (GAUZE/BANDAGES/DRESSINGS) ×1 IMPLANT
BNDG ESMARK 6X9 LF (GAUZE/BANDAGES/DRESSINGS) ×1
COVER SURGICAL LIGHT HANDLE (MISCELLANEOUS) ×1 IMPLANT
CUFF TOURN SGL QUICK 34 (TOURNIQUET CUFF) ×1
CUFF TOURN SGL QUICK 42 (TOURNIQUET CUFF) IMPLANT
CUFF TRNQT CYL 34X4.125X (TOURNIQUET CUFF) IMPLANT
DRAPE ARTHROSCOPY W/POUCH 114 (DRAPES) ×1 IMPLANT
DRAPE SURG 17X23 STRL (DRAPES) ×2 IMPLANT
DRAPE U-SHAPE 47X51 STRL (DRAPES) ×1 IMPLANT
DURAPREP 26ML APPLICATOR (WOUND CARE) ×1 IMPLANT
ELECT CAUTERY BLADE 6.4 (BLADE) IMPLANT
FACESHIELD WRAPAROUND (MASK) IMPLANT
FACESHIELD WRAPAROUND OR TEAM (MASK) ×1 IMPLANT
GAUZE PAD ABD 8X10 STRL (GAUZE/BANDAGES/DRESSINGS) IMPLANT
GAUZE SPONGE 4X4 12PLY STRL (GAUZE/BANDAGES/DRESSINGS) ×1 IMPLANT
GAUZE XEROFORM 1X8 LF (GAUZE/BANDAGES/DRESSINGS) ×1 IMPLANT
GLOVE BIOGEL PI IND STRL 7.0 (GLOVE) ×2 IMPLANT
GLOVE BIOGEL PI IND STRL 7.5 (GLOVE) ×1 IMPLANT
GLOVE ECLIPSE 7.0 STRL STRAW (GLOVE) ×1 IMPLANT
GLOVE SKINSENSE STRL SZ7.5 (GLOVE) ×2 IMPLANT
GLOVE SURG SYN 7.5  E (GLOVE) ×2
GLOVE SURG SYN 7.5 E (GLOVE) ×2 IMPLANT
GLOVE SURG SYN 7.5 PF PI (GLOVE) ×2 IMPLANT
GLOVE SURG UNDER POLY LF SZ7 (GLOVE) ×19 IMPLANT
GLOVE SURG UNDER POLY LF SZ7.5 (GLOVE) ×4 IMPLANT
GOWN STRL REIN XL XLG (GOWN DISPOSABLE) ×1 IMPLANT
KIT TURNOVER KIT B (KITS) ×1 IMPLANT
MANIFOLD NEPTUNE II (INSTRUMENTS) IMPLANT
NS IRRIG 1000ML POUR BTL (IV SOLUTION) IMPLANT
PACK ARTHROSCOPY DSU (CUSTOM PROCEDURE TRAY) ×1 IMPLANT
PAD ARMBOARD 7.5X6 YLW CONV (MISCELLANEOUS) ×2 IMPLANT
PADDING CAST COTTON 6X4 STRL (CAST SUPPLIES) ×1 IMPLANT
PORT APPOLLO RF 90DEGREE MULTI (SURGICAL WAND) IMPLANT
SPONGE T-LAP 4X18 ~~LOC~~+RFID (SPONGE) ×1 IMPLANT
SUT ETHILON 3 0 PS 1 (SUTURE) ×1 IMPLANT
TOWEL GREEN STERILE (TOWEL DISPOSABLE) ×1 IMPLANT
TOWEL GREEN STERILE FF (TOWEL DISPOSABLE) ×1 IMPLANT
TUBING ARTHROSCOPY IRRIG 16FT (MISCELLANEOUS) ×1 IMPLANT
WATER STERILE IRR 1000ML POUR (IV SOLUTION) ×1 IMPLANT

## 2021-12-12 NOTE — H&P (Signed)
PREOPERATIVE H&P  Chief Complaint: left knee lateral meniscal tear  HPI: Stefanie [name] is a 61 y.o. female who presents for surgical treatment of left knee lateral meniscal tear.  She denies any changes in medical history.  Past Medical History:  Diagnosis Date   Anemia    Arthritis    Asthma    DDD (degenerative disc disease), lumbar    Diabetes insipidus ([place])    Fibromyalgia    Headache    hx of migraines   Hypothyroidism    Lupus ([place])    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   Mitral valve prolapse    pt had echo on 09/04/21   PONV (postoperative nausea and vomiting)    PVC's (premature ventricular contractions)    SLE (systemic lupus erythematosus) ([place]) 12/06/2012   Thyroid disease    Past Surgical History:  Procedure Laterality Date   COLONOSCOPY     PITUITARY EXCISION  1993   TONSILLECTOMY     Social History   Socioeconomic History   Marital status: Married    Spouse name: Not on file   Number of children: 2   Years of education: Not on file   Highest education level: Not on file  Occupational History   Not on file  Tobacco Use   Smoking status: Never    Passive exposure: Never   Smokeless tobacco: Never  Vaping Use   Vaping Use: Never used  Substance and Sexual Activity   Alcohol use: No   Drug use: No   Sexual activity: Not on file  Other Topics Concern   Not on file  Social History Narrative   Right handed   Social Determinants of Health   Financial Resource Strain: Not on file  Food Insecurity: Not on file  Transportation Needs: Not on file  Physical Activity: Not on file  Stress: Not on file  Social Connections: Not on file   Family History  Problem Relation Age of Onset   Atrial fibrillation Mother    Dementia Mother    Diabetes Father    Heart disease Father    Hyperlipidemia Father    Hypertension Father    Stroke Father    Diabetes Sister    Kidney disease Sister         transplant   Heart disease Sister        open heart surgery    Pancreatic disease Sister        transplant    Cancer Maternal Grandmother    Diabetes Paternal Grandfather    Heart disease Paternal Grandfather    Hyperlipidemia Paternal Grandfather    Hypertension Paternal Merchant navy officer    Healthy Daughter    Healthy Son    Colon cancer Neg Hx    Esophageal cancer Neg Hx    Rectal cancer Neg Hx    Stomach cancer Neg Hx    Allergies  Allergen Reactions   Iron Sucrose Rash    Chest pain   Morphine Nausea And Vomiting   Venofer [Ferric Oxide] Itching, Palpitations and Rash    Dizziness, sweating, "felt like I was going to pass out" unknown when episode happened, itching   Azithromycin Other (See Comments)    "couldnt sleep"   Other     Other reaction(s): Other (See Comments) Magic mouth wash. Caused tongue to have a burning feeling   Prior to Admission medications   Medication Sig Start Date End Date Taking? Authorizing Provider  acetaminophen (TYLENOL) 500 MG tablet Take 1,000 mg by mouth every 6 (six) hours as needed for moderate pain.   Yes [provider]  albuterol (PROVENTIL HFA;VENTOLIN HFA) 108 (90 BASE) MCG/ACT inhaler Inhale 1 puff into the lungs every 6 (six) hours as needed for wheezing or shortness of breath.   Yes [provider]  azaTHIOprine (IMURAN) 50 MG tablet TAKE 1 AND 1/2 TABLETS DAILY BY MOUTH Patient taking differently: Take 25-50 mg by mouth See admin instructions. Take 25 mg by mouth at bedtime and 50 mg during the day 12/01/21  Yes Deveshwar, Janalyn Rouse, MD  levothyroxine (SYNTHROID, LEVOTHROID) 125 MCG tablet Take 125 mcg by mouth daily before breakfast. 06/14/14  Yes [provider]  MITIGARE 0.6 MG CAPS TAKE 1 CAPSULE BY MOUTH EVERY DAY 12/01/21  Yes Deveshwar, Janalyn Rouse, MD  Polyvinyl Alcohol-Povidone (REFRESH OP) Place 1 drop into both eyes as needed (dry eyes).   Yes [provider]  HYDROcodone-acetaminophen (NORCO) 5-325 MG  tablet Take 1 tablet by mouth 3 (three) times daily as needed. To be taken after surgery Patient not taking: Reported on 12/03/2021 11/13/21   Cristie Hem, PA-C     Positive ROS: All other systems have been reviewed and were otherwise negative with the exception of those mentioned in the HPI and as above.  Physical Exam: General: Alert, no acute distress Cardiovascular: No pedal edema Respiratory: No cyanosis, no use of accessory musculature GI: abdomen soft Skin: No lesions in the area of chief complaint Neurologic: Sensation intact distally Psychiatric: Patient is competent for consent with normal mood and affect Lymphatic: no lymphedema  MUSCULOSKELETAL: exam stable  Assessment: left knee lateral meniscal tear  Plan: Plan for Procedure(s): LEFT KNEE ARTHROSCOPY, PARTIAL LATERAL MENISCECTOMY  The risks benefits and alternatives were discussed with the patient including but not limited to the risks of nonoperative treatment, versus surgical intervention including infection, bleeding, nerve injury,  blood clots, cardiopulmonary complications, morbidity, mortality, among others, and they were willing to proceed.   Glee Arvin, MD 12/12/2021 6:07 AM

## 2021-12-12 NOTE — Discharge Instructions (Signed)

## 2021-12-12 NOTE — Op Note (Signed)
   Surgery Date: 12/12/2021  PREOPERATIVE DIAGNOSES:  1. Left knee lateral meniscus tear 2. Left knee chondromalacia  POSTOPERATIVE DIAGNOSES:  same  PROCEDURES PERFORMED:  1.  Left knee arthroscopy with limited synovectomy 2.  Left knee arthroscopy with arthroscopic partial lateral meniscectomy 3.  Left knee arthroscopy with arthroscopic chondroplasty lateral femoral condyle and patellar surface  SURGEON: N. Eduard Roux, M.D.  ASSIST: None  ANESTHESIA:  general  FLUIDS: Per anesthesia record.   ESTIMATED BLOOD LOSS: minimal  * No implants in log *  DESCRIPTION OF PROCEDURE: Stefanie [name] is a 61 y.o.-year-old female with above mentioned conditions. Full discussion held regarding risks benefits alternatives and complications related surgical intervention. Conservative care options reviewed. All questions answered.  The patient was identified in the preoperative holding area and the operative extremity was marked. The patient was brought to the operating room and transferred to operating table in a supine position. Satisfactory general anesthesia was induced by anesthesiology.    Standard anterolateral, anteromedial arthroscopy portals were obtained. The anteromedial portal was obtained with a spinal needle for localization under direct visualization with subsequent diagnostic findings.   With a gentle valgus force the medial compartment was inspected.  No significant chondromalacia was encountered.  Medial meniscus was unremarkable.  Cruciates were unremarkable.  The leg was then placed into a figure-of-four position and grade 4 changes were found on the weightbearing surface of the lateral femoral condyle.  There were no unstable areas of cartilage.  Grade 3 changes were noted on the lateral tibial plateau.  A complex tear of the anterior horn and mid body of the lateral meniscus was encountered.  The posterior horn was fine and the meniscal root was fine.  Partial lateral meniscectomy  was performed with a combination meniscus basket and oscillating shaver back to stable margins.  The knee was then placed into extension to evaluate the patellofemoral compartment.  There were grade 4 changes of the medial aspect of the femoral trochlea and grade 3 changes of the patellar surface.  Chondroplasty was performed in the posterior surface of the patella.  Gutters were checked for loose bodies.  Excess fluid was removed from the knee joint.  Incisions were closed with interrupted nylon sutures.  Sterile dressings were applied.  Patient tolerated procedure well had no immediate complications.  Suprapatellar pouch and gutters: no synovitis or debris. Patella chondral surface: Grade 3 Trochlear chondral surface: Grade 4 Patellofemoral tracking: Normal Medial meniscus: Normal.  Medial femoral condyle weight bearing surface: Grade 0 Medial tibial plateau: Grade 0 Anterior cruciate ligament:stable Posterior cruciate ligament:stable Lateral meniscus: Complex tear anterior horn and mid body.   Lateral femoral condyle weight bearing surface: Grade 4 Lateral tibial plateau: Grade 3  DISPOSITION: The patient was awakened from general anesthetic, extubated, taken to the recovery room in medically stable condition, no apparent complications. The patient may be weightbearing as tolerated to the operative lower extremity.  Range of motion of right knee as tolerated.  Azucena Cecil, MD [facility] 8:47 AM

## 2021-12-12 NOTE — Anesthesia Procedure Notes (Signed)
Procedure Name: LMA Insertion Date/Time: 12/12/2021 7:22 AM  Performed by: Kyung Rudd, CRNAPre-anesthesia Checklist: Patient identified, Emergency Drugs available, Suction available and Patient being monitored Patient Re-evaluated:Patient Re-evaluated prior to induction Oxygen Delivery Method: Circle system utilized Preoxygenation: Pre-oxygenation with 100% oxygen Induction Type: IV induction LMA: LMA inserted LMA Size: 4.0 Number of attempts: 1 Placement Confirmation: positive ETCO2 and breath sounds checked- equal and bilateral Tube secured with: Tape Dental Injury: Teeth and Oropharynx as per pre-operative assessment

## 2021-12-12 NOTE — Transfer of Care (Signed)
Immediate Anesthesia Transfer of Care Note  Patient: Stefanie [name]  Procedure(s) Performed: LEFT KNEE ARTHROSCOPIC DEBRIDEMENT, LATERAL MENISCECTOMY (Left: Knee)  Patient Location: PACU  Anesthesia Type:General  Level of Consciousness: awake, alert  and oriented  Airway & Oxygen Therapy: Patient Spontanous Breathing  Post-op Assessment: Report given to RN and Post -op Vital signs reviewed and stable  Post vital signs: Reviewed and stable  Last Vitals:  Vitals Value Taken Time  BP 115/76 12/12/21 0824  Temp 36.5 C 12/12/21 0824  Pulse 74 12/12/21 0828  Resp 14 12/12/21 0828  SpO2 100 % 12/12/21 0828  Vitals shown include unvalidated device data.  Last Pain:  Vitals:   12/12/21 0644  TempSrc:   PainSc: 0-No pain         Complications: No notable events documented.

## 2021-12-15 ENCOUNTER — Encounter (HOSPITAL_COMMUNITY): Payer: Self-pay | Admitting: Orthopaedic Surgery

## 2021-12-15 NOTE — Anesthesia Postprocedure Evaluation (Signed)
Anesthesia Post Note  Patient: Danessa Mensch  Procedure(s) Performed: LEFT KNEE ARTHROSCOPIC DEBRIDEMENT, LATERAL MENISCECTOMY (Left: Knee)     Patient location during evaluation: PACU Anesthesia Type: General Level of consciousness: awake and alert Pain management: pain level controlled Vital Signs Assessment: post-procedure vital signs reviewed and stable Respiratory status: spontaneous breathing, nonlabored ventilation, respiratory function stable and patient connected to nasal cannula oxygen Cardiovascular status: blood pressure returned to baseline and stable Postop Assessment: no apparent nausea or vomiting Anesthetic complications: no   No notable events documented.  Last Vitals:  Vitals:   12/12/21 0845 12/12/21 0855  BP: 113/72 111/73  Pulse: 70 69  Resp: 16 16  Temp:  36.4 C  SpO2: 100% 100%    Last Pain:  Vitals:   12/12/21 0824  TempSrc:   PainSc: 0-No pain                 Samuel Mcpeek S

## 2021-12-22 ENCOUNTER — Ambulatory Visit (INDEPENDENT_AMBULATORY_CARE_PROVIDER_SITE_OTHER): Payer: 59 | Admitting: Physician Assistant

## 2021-12-22 ENCOUNTER — Encounter: Payer: Self-pay | Admitting: Physician Assistant

## 2021-12-22 VITALS — BP 124/79 | HR 100 | Temp 98.4°F

## 2021-12-22 DIAGNOSIS — Z9889 Other specified postprocedural states: Secondary | ICD-10-CM

## 2021-12-22 NOTE — Progress Notes (Signed)
Post-Op Visit Note   Patient: Stefanie [name]           Date of Birth: 05-01-1960           MRN: [id number] Visit Date: 12/22/2021 PCP: Shon Baton, MD   Assessment & Plan:  Chief Complaint:  Chief Complaint  Patient presents with   Left Knee - Follow-up    Left knee arthroscopy 12/12/2021   Visit Diagnoses:  1. S/P left knee arthroscopy     Plan: Patient has a pleasant 61 year old female who comes in today 1 week status post left knee arthroscopic debridement lateral meniscus and chondroplasty 12/12/2021.  She has been doing okay.  She has been in a moderate amount of pain but is taking over-the-counter Tylenol and Advil.  She is still ambulating with crutches.  She does tell me she has had a slight fever over the past few days in addition to left lower quadrant pain, but no other constitutional symptoms.  She denies any calf pain.  Examination left knee reveals fully healed surgical portals without complication.  Calf is soft nontender.  She is neurovascular intact distally.  Today, sutures were removed and Steri-Strips applied.  Intraoperative pictures reviewed.  Work note provided to be out for another 2 weeks but she will let us know if she needs to be out further.  She will call us and let us know.  In regards to the fever and left lower quadrant pain, do not feel as though this is from constipation as she had a bowel movement yesterday.  I have discussed with her that I would like for her to touch base with her primary care provider and rheumatologist.  She will follow-up with [place] in 5 weeks for recheck.  Call with concerns or questions.  Follow-Up Instructions: Return in about 5 weeks (around 01/26/2022).   Orders:  No orders of the defined types were placed in this encounter.  No orders of the defined types were placed in this encounter.   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   Prolonged QT interval 07/28/2016   Primary osteoarthritis of both hands 05/11/2016   Primary osteoarthritis of both knees 05/11/2016   History of hypothyroidism 05/11/2016   History of migraine 05/11/2016   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, 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, 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, anticardiolipin antibody and positive rheumatoid factor 04/29/2016   ANA positive 04/29/2016   Rheumatoid factor positive 04/29/2016   Pseudogout 04/29/2016   Chondrocalcinosis 04/29/2016   High risk medication use 04/29/2016   Sjogren's syndrome with keratoconjunctivitis sicca ([place]) 04/29/2016   History of neutropenia 04/29/2016   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   Hyponatremia 12/12/2013   Fever 12/13/2012   Pleuritic chest pain 12/06/2012   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   Anxiety 12/06/2012   ANEMIA-IRON DEFICIENCY 01/08/2010   GASTRIC DILATION, ACUTE 01/08/2010   NAUSEA ALONE 01/08/2010   ABDOMINAL PAIN, LEFT UPPER QUADRANT 01/08/2010   Past Medical History:  Diagnosis Date   Anemia    Arthritis  Asthma    DDD (degenerative disc disease), lumbar    Diabetes insipidus ([facility])    Fibromyalgia    Headache    hx of migraines   Hypothyroidism    Lupus (HCC)    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   Mitral valve prolapse    pt had echo on 09/04/21   PONV (postoperative nausea and vomiting)    PVC's (premature ventricular contractions)    SLE (systemic lupus erythematosus) ([facility]CC) 12/06/2012   Thyroid disease     Family History  Problem Relation Age of  Onset   Atrial fibrillation Mother    Dementia Mother    Diabetes Father    Heart disease Father    Hyperlipidemia Father    Hypertension Father    Stroke Father    Diabetes Sister    Kidney disease Sister        transplant   Heart disease Sister        open heart surgery    Pancreatic disease Sister        transplant    Cancer Maternal Grandmother    Diabetes Paternal Grandfather    Heart disease Paternal Grandfather    Hyperlipidemia Paternal Grandfather    Hypertension Paternal Actor    Healthy Daughter    Healthy Son    Colon cancer Neg Hx    Esophageal cancer Neg Hx    Rectal cancer Neg Hx    Stomach cancer Neg Hx     Past Surgical History:  Procedure Laterality Date   COLONOSCOPY     KNEE ARTHROSCOPY WITH MENISCAL REPAIR Left 12/12/2021   Procedure: LEFT KNEE ARTHROSCOPIC DEBRIDEMENT, LATERAL MENISCECTOMY;  Surgeon: Tarry Kos, MD;  Location: [facility] OR;  Service: Orthopedics;  Laterality: Left;   PITUITARY EXCISION  1993   TONSILLECTOMY     Social History   Occupational History   Not on file  Tobacco Use   Smoking status: Never    Passive exposure: Never   Smokeless tobacco: Never  Vaping Use   Vaping Use: Never used  Substance and Sexual Activity   Alcohol use: No   Drug use: No   Sexual activity: Not on file

## 2022-01-03 DIAGNOSIS — Z23 Encounter for immunization: Secondary | ICD-10-CM | POA: Diagnosis not present

## 2022-01-16 DIAGNOSIS — S0181XA Laceration without foreign body of other part of head, initial encounter: Secondary | ICD-10-CM | POA: Diagnosis not present

## 2022-01-21 ENCOUNTER — Ambulatory Visit (INDEPENDENT_AMBULATORY_CARE_PROVIDER_SITE_OTHER): Payer: 59 | Admitting: Orthopaedic Surgery

## 2022-01-21 ENCOUNTER — Ambulatory Visit (INDEPENDENT_AMBULATORY_CARE_PROVIDER_SITE_OTHER): Payer: 59

## 2022-01-21 DIAGNOSIS — M25561 Pain in right knee: Secondary | ICD-10-CM | POA: Diagnosis not present

## 2022-01-21 DIAGNOSIS — Z9889 Other specified postprocedural states: Secondary | ICD-10-CM | POA: Diagnosis not present

## 2022-01-21 DIAGNOSIS — G8929 Other chronic pain: Secondary | ICD-10-CM

## 2022-01-21 DIAGNOSIS — M1712 Unilateral primary osteoarthritis, left knee: Secondary | ICD-10-CM | POA: Diagnosis not present

## 2022-01-21 NOTE — Progress Notes (Addendum)
Post-Op Visit Note   Patient: Stefanie [name]           Date of Birth: [birth year]           MRN: [id number] Visit Date: 01/21/2022 PCP: Creola Corn, MD   Assessment & Plan:  Chief Complaint:  Chief Complaint  Patient presents with   Left Knee - Pain   Visit Diagnoses:  1. S/P left knee arthroscopy   2. Chronic pain of right knee   3. Primary osteoarthritis of left knee     Plan: Pranathi is a pleasant 61 year old female who comes in today approximately 5 weeks status post left knee arthroscopic debridement lateral meniscus and chondroplasty, date of surgery 12/12/2021.  It was noted during operative intervention she had grade 3 and 4 changes the patellofemoral and lateral compartments.  She has been doing better but still notes mild pain and swelling which is actually improved over time.  She tells me that she was on an anniversary trip to [place] about a week ago when she tripped on uneven ground lacerating her chin and injuring her right knee.  She has had pain to the anterior right knee since.  No swelling and minimal pain with ambulation.  Examination of her left knee reveals a moderate-sized effusion.  Range of motion from about 5 to 100 degrees.  Right knee exam shows no effusion.  No ecchymosis.  She does have moderate tenderness to the patella.  No joint line tenderness.  Ligaments are stable.  She is neurovascular intact distally.  In regards to the left knee, she is likely having residual pain and effusion from her underlying arthritis and the fact that she has returned to work.  She will ice and elevate is much as possible.  We discussed that if this persist or worsens over the next several weeks she may follow-up for aspiration and injection.  Due to the underlying lateral compartment degenerative changes To quad ratio and overall instability to the left knee, would like to put in a referral for a custom fit lateral unloader DJO brace.  In regards to the right knee, I believe  she likely has a contusion which will improve with time.  She will follow-up with [place] as needed.  Knee pain is limiting ADLs and has failed to respond to PT.  She has not responded to knee bracing with a neoprene sleeve.    Follow-Up Instructions: Return if symptoms worsen or fail to improve.   Orders:  Orders Placed This Encounter  Procedures   XR KNEE 3 VIEW RIGHT   No orders of the defined types were placed in this encounter.   Imaging: No results found.  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   Prolonged QT interval 07/28/2016   Primary osteoarthritis of both hands 05/11/2016   Primary osteoarthritis of both knees 05/11/2016   History of hypothyroidism 05/11/2016   History of migraine 05/11/2016   Autoimmune disease ([facility]) positive ANA, 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, 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, anticardiolipin antibody and positive rheumatoid factor 04/29/2016   ANA positive 04/29/2016  Rheumatoid factor positive 04/29/2016   Pseudogout 04/29/2016   Chondrocalcinosis 04/29/2016   High risk medication use 04/29/2016   Sjogren's syndrome with keratoconjunctivitis sicca ([facility]CC) 04/29/2016   History of neutropenia 04/29/2016   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   Hyponatremia 12/12/2013   Fever 12/13/2012   Pleuritic chest pain 12/06/2012   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   Anxiety 12/06/2012   ANEMIA-IRON DEFICIENCY 01/08/2010   GASTRIC DILATION, ACUTE 01/08/2010   NAUSEA ALONE 01/08/2010   ABDOMINAL PAIN, LEFT UPPER QUADRANT  01/08/2010   Past Medical History:  Diagnosis Date   Anemia    Arthritis    Asthma    DDD (degenerative disc disease), lumbar    Diabetes insipidus ([facility]CC)    Fibromyalgia    Headache    hx of migraines   Hypothyroidism    Lupus (HCC)    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   Mitral valve prolapse    pt had echo on 09/04/21   PONV (postoperative nausea and vomiting)    PVC's (premature ventricular contractions)    SLE (systemic lupus erythematosus) ([facility]) 12/06/2012   Thyroid disease     Family History  Problem Relation Age of Onset   Atrial fibrillation Mother    Dementia Mother    Diabetes Father    Heart disease Father    Hyperlipidemia Father    Hypertension Father    Stroke Father    Diabetes Sister    Kidney disease Sister        transplant   Heart disease Sister        open heart surgery    Pancreatic disease Sister        transplant    Cancer Maternal Grandmother    Diabetes Paternal Grandfather    Heart disease Paternal Grandfather    Hyperlipidemia Paternal Grandfather    Hypertension Paternal Actor    Healthy Daughter    Healthy Son    Colon cancer Neg Hx    Esophageal cancer Neg Hx    Rectal cancer Neg Hx    Stomach cancer Neg Hx     Past Surgical History:  Procedure Laterality Date   COLONOSCOPY     KNEE ARTHROSCOPY WITH MENISCAL REPAIR Left 12/12/2021   Procedure: LEFT KNEE ARTHROSCOPIC DEBRIDEMENT, LATERAL MENISCECTOMY;  Surgeon: Tarry Kos, MD;  Location: [facility] OR;  Service: Orthopedics;  Laterality: Left;   PITUITARY EXCISION  1993   TONSILLECTOMY     Social History   Occupational History   Not on file  Tobacco Use   Smoking status: Never    Passive exposure: Never   Smokeless tobacco: Never  Vaping Use   Vaping Use: Never used  Substance and Sexual Activity   Alcohol use: No   Drug use: No   Sexual activity: Not on file

## 2022-02-11 DIAGNOSIS — M1712 Unilateral primary osteoarthritis, left knee: Secondary | ICD-10-CM | POA: Diagnosis not present

## 2022-02-19 NOTE — Progress Notes (Signed)
Office Visit Note  Patient: Stefanie [name]             Date of Birth: [birth year]           MRN: [id number]             PCP: Creola Cornusso, John, MD Referring: Creola Cornusso, John, MD Visit Date: 03/05/2022 Occupation: @GUAROCC @  Subjective:  Left knee joint pain   History of Present Illness: Stefanie [name] is a 61 y.o. female with history of sjogren's syndrome and osteoarthritis.  Patient remains on Imuran 50 mg 1 tablet in the morning and half tablet in the evening.  She is tolerating Imuran without any side effects and has not missed any doses recently.  Patient continues to have chronic sicca symptoms.  She has been using refresh eyedrops for symptomatic relief.  She denies any swollen lymph nodes.  She denies any sores in her mouth or nose.  She denies any new or worsening pulmonary symptoms.  She will be following up with her pulmonologist soon to have updated PFTs performed.   Patient reports that she underwent left knee arthroscopic surgery on 12/12/2021 performed by Dr. Roda ShuttersXu.  She did not go to formal physical therapy but has been performing home exercises.  According to the patient she will be getting a brace prescribed by Dr. Roda ShuttersXu soon.  She continues to have fluid retention in the left lower leg.  She was evaluated by her PCP Dr. Timothy Lassousso last week who recommended the use of compression stockings.  She continues to have persistent pain and stiffness in the left knee joint but states that lately she has been overdoing it with the holidays as well as with work.  She continues to experience stiffness in both hands but denies any joint swelling.  She denies any other increased joint pain or joint swelling at this time.  She remains on colchicine 0.6 mg 1 tablet daily. Patient reports that she had a fall on 01/16/2022 on concrete at which time she had a laceration on her chin.  She did not have any fractures and did not injure the left knee at that time.    Activities of Daily Living:  Patient reports  morning stiffness for 20 minutes.   Patient Denies nocturnal pain.  Difficulty dressing/grooming: Denies Difficulty climbing stairs: Reports Difficulty getting out of chair: Reports Difficulty using hands for taps, buttons, cutlery, and/or writing: Reports  Review of Systems  Constitutional:  Positive for fatigue.  HENT:  Positive for mouth dryness. Negative for mouth sores.   Eyes:  Positive for dryness.  Respiratory:  Positive for shortness of breath.   Cardiovascular:  Positive for palpitations. Negative for chest pain.  Gastrointestinal:  Negative for blood in stool, constipation and diarrhea.  Endocrine: Negative.  Negative for increased urination.  Genitourinary: Negative.  Negative for involuntary urination.  Musculoskeletal:  Positive for joint pain, gait problem, joint pain, joint swelling and morning stiffness. Negative for myalgias, muscle weakness, muscle tenderness and myalgias.  Skin: Negative.  Negative for color change, rash, hair loss and sensitivity to sunlight.  Allergic/Immunologic: Negative for susceptible to infections.  Neurological:  Negative for dizziness and headaches.  Hematological:  Negative for swollen glands.  Psychiatric/Behavioral:  Positive for sleep disturbance. Negative for depressed mood. The patient is not nervous/anxious.     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   Prolonged QT interval 07/28/2016   Primary osteoarthritis of both hands 05/11/2016   Primary osteoarthritis of both knees  05/11/2016   History of hypothyroidism 05/11/2016   History of migraine 05/11/2016   Autoimmune disease (HCC) positive ANA, 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, 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, anticardiolipin antibody and positive rheumatoid factor 04/29/2016   ANA positive 04/29/2016   Rheumatoid factor positive 04/29/2016   Pseudogout 04/29/2016   Chondrocalcinosis 04/29/2016   High risk medication use 04/29/2016   Sjogren's syndrome with keratoconjunctivitis sicca (HCC) 04/29/2016   History of neutropenia 04/29/2016   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   Hyponatremia 12/12/2013   Fever 12/13/2012   Pleuritic chest pain 12/06/2012   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   Anxiety 12/06/2012   ANEMIA-IRON DEFICIENCY 01/08/2010   GASTRIC DILATION, ACUTE 01/08/2010   NAUSEA ALONE 01/08/2010   ABDOMINAL PAIN, LEFT UPPER QUADRANT 01/08/2010    Past Medical History:  Diagnosis Date   Anemia    Arthritis    Asthma    DDD (degenerative disc disease), lumbar    Diabetes insipidus (HCC)    Fibromyalgia    Headache    hx of migraines   Hypothyroidism    Lupus (HCC)    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   Mitral valve prolapse    pt had echo on 09/04/21   PONV (postoperative nausea and vomiting)    PVC's (premature ventricular contractions)    SLE (systemic lupus erythematosus) (HCC) 12/06/2012   Thyroid disease     Family History  Problem Relation Age of Onset   Atrial fibrillation Mother    Dementia Mother    Diabetes Father    Heart disease Father    Hyperlipidemia Father    Hypertension Father    Stroke Father    Diabetes Sister    Kidney disease Sister        transplant   Heart disease Sister        open heart surgery    Pancreatic disease Sister        transplant    Cancer Maternal Grandmother    Diabetes Paternal Grandfather    Heart disease Paternal Grandfather    Hyperlipidemia Paternal Grandfather    Hypertension Paternal Actor    Healthy  Daughter    Healthy Son    Colon cancer Neg Hx    Esophageal cancer Neg Hx    Rectal cancer Neg Hx    Stomach cancer Neg Hx    Past Surgical History:  Procedure Laterality Date   COLONOSCOPY     KNEE ARTHROSCOPY WITH MENISCAL REPAIR Left 12/12/2021   Procedure: LEFT KNEE ARTHROSCOPIC DEBRIDEMENT, LATERAL MENISCECTOMY;  Surgeon: Tarry Kos, MD;  Location: [facility] OR;  Service: Orthopedics;  Laterality: Left;   PITUITARY EXCISION  1993   TONSILLECTOMY     Social History   Social History Narrative   Right handed   Immunization History  Administered Date(s) Administered   Influenza Inj Mdck Quad Pf 01/21/2020   Influenza-Unspecified 09/28/2018   PFIZER(Purple Top)SARS-COV-2 Vaccination 11/17/2019, 12/13/2019     Objective: Vital Signs: BP 112/74 (BP Location: Left Arm, Patient Position: Sitting, Cuff Size: Normal)  Pulse 74   Resp 16   Ht 5\' 7"  (1.702 m)   Wt 138 lb (62.6 kg)   LMP 04/09/2014 (Approximate)   BMI 21.61 kg/m    Physical Exam Vitals and nursing note reviewed.  Constitutional:      Appearance: She is well-developed.  HENT:     Head: Normocephalic and atraumatic.  Eyes:     Conjunctiva/sclera: Conjunctivae normal.  Cardiovascular:     Rate and Rhythm: Normal rate and regular rhythm.     Heart sounds: Normal heart sounds.  Pulmonary:     Effort: Pulmonary effort is normal.     Breath sounds: Normal breath sounds.  Abdominal:     General: Bowel sounds are normal.     Palpations: Abdomen is soft.  Musculoskeletal:     Cervical back: Normal range of motion.  Skin:    General: Skin is warm and dry.     Capillary Refill: Capillary refill takes less than 2 seconds.  Neurological:     Mental Status: She is alert and oriented to person, place, and time.  Psychiatric:        Behavior: Behavior normal.      Musculoskeletal Exam: C-spine has slightly limited ROM.  Trapezius muscle tension and tenderness bilaterally.  Thoracic spine and lumbar spine good  ROM.  Shoulder joints, elbow joints, wrist joints, Mcps, PIPs, and DIPs good ROM with no synovitis.  PIP and DIP thickening. Complete fist formation bilaterally.  Hip joints have good ROM.  Painful ROM of the left knee with warmth and a small effusion.  Right knee has good ROM with no warmth or effusion.  Ankle joints have good ROM with no tenderness or joint swelling.   CDAI Exam: CDAI Score: -- Patient Global: --; Provider Global: -- Swollen: --; Tender: -- Joint Exam 03/05/2022   No joint exam has been documented for this visit   There is currently no information documented on the homunculus. Go to the Rheumatology activity and complete the homunculus joint exam.  Investigation: No additional findings.  Imaging: No results found.  Recent Labs: Lab Results  Component Value Date   WBC 3.9 (L) 12/04/2021   HGB 12.9 12/04/2021   PLT 222 12/04/2021   NA 139 12/04/2021   K 4.4 12/04/2021   CL 102 12/04/2021   CO2 28 12/04/2021   GLUCOSE 101 (H) 12/04/2021   BUN 14 12/04/2021   CREATININE 0.72 12/04/2021   BILITOT 0.6 09/01/2021   ALKPHOS 82 05/15/2020   AST 27 09/01/2021   ALT 16 09/01/2021   PROT 7.4 09/01/2021   PROT 7.4 09/01/2021   ALBUMIN 4.1 05/15/2020   CALCIUM 9.5 12/04/2021   GFRAA 102 04/24/2020   QFTBGOLDPLUS NEGATIVE 10/16/2019    Speciality Comments: PLQ Eye Exam 02/26/2020 WNL @ [facility] follow up in 1 year  Procedures:  No procedures performed Allergies: Iron sucrose, Morphine, Venofer [ferric oxide], Azithromycin, and Other    Assessment / Plan:     Visit Diagnoses: Sjogren's syndrome with keratoconjunctivitis sicca (HCC) - Positive ANA, positive Ro, positive La, positive RF sicca symptoms, inflammatory arthritis: Patient continues to have chronic sicca symptoms.  Overall her symptoms have been manageable.  She has been using refresh eyedrops for symptomatic relief.  She remains on Imuran 50 mg 1 tablet in the morning and half tablet in  the evening.  She is tolerating Imuran without any side effects.  She has not missed any doses of Imuran recently.  She has no signs of  inflammatory arthritis at this time.  She has not had any cervical lymphadenopathy.  Discussed the 4-14 fold increased risk for developing lymphoma in patients with Sjogren's syndrome.  The following lab work will be updated today for further evaluation.  She will remain on Imuran as prescribed.  She was advised to notify us if she develops signs or symptoms of a flare.  She will follow-up in the office in 5 months or sooner if needed.- Plan: CBC with Differential/Platelet, COMPLETE METABOLIC PANEL WITH GFR, C3 and C4, Sedimentation rate, Urinalysis, Routine w reflex microscopic, Serum protein electrophoresis with reflex, Rheumatoid factor, Anti-DNA antibody, double-stranded  High risk medication use - Imuran 50 mg 1 tablet in the morning and half tablet in the evening. CBC and BMP updated on 12/04/21.  Orders for CBC and CMP released today.  Her next lab work will be due in March and every 3 months.  No recent or recurrent infections. Discussed the importance of holding imuran if she develops signs or symptoms of an infection and to resume once the infection has completely cleared.   - Plan: CBC with Differential/Platelet, COMPLETE METABOLIC PANEL WITH GFR  Other drug-induced neutropenia (HCC) -White blood cell count was 3.9 on 12/04/2021.  CBC with diff updated today. 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 Isaiah Serge.  High-resolution chest CT updated on 11/03/2021: Scattered thin-walled cyst which was most pronounced in the lower lungs and multiple small solid pulmonary nodules compatible with lymphoid interstitial pneumonia.  No evidence of progression when compared to 07/25/2020 exam.  No new or worsening pulmonary symptoms.  Patient will be going for updated pulmonary function testing soon.  Primary osteoarthritis of  both hands: She has PIP and DIP thickening consistent with osteoarthritis of both hands.  No inflammation noted on examination today.  She experiences intermittent stiffness in both hands.  Discussed the importance of joint protection and muscle strengthening.  Primary osteoarthritis of both knees - X-rays of the right knee were consistent with moderate osteoarthritis and moderate chondromalacia patella on 01/23/2021.  Right knee has good range of motion with no warmth or effusion.  Left knee joint has warmth and a small effusion as well as discomfort with range of motion.  She had left knee arthroscopic surgery on 12/12/2021 performed by Dr. Roda Shutters.   Effusion, left knee - MRI of the left knee 09/27/2021: revealed large joint effusion with synovitis, advanced lateral tibiofemoral arthritis characterized by complex degenerative tear of the body of the lateral meniscus with meniscal extrusion and full-thickness cartilage defect.  She underwent left knee arthroscopic surgery on 12/12/2021 performed by Dr. Roda Shutters.  She did not go to formal physical therapy but has been completing home exercises.  She will be getting a brace from Dr. Deno Etienne soon to help with stabilization.  On examination she continues to have pain with range of motion as well as warmth and a small effusion.  Chondromalacia patellae, right knee: She has good range of motion of the right knee joint on examination today.  No warmth or effusion noted.  Chondrocalcinosis -She continues to take colchicine 0.6 mg 1 capsule by mouth daily.  Cervical spondylosis without myelopathy: C-spine has limited range of motion especially with lateral rotation.  Fibromyalgia: Patient is experiencing trapezius muscle tension and tenderness bilaterally currently.  She is having symptoms of a mild fibromyalgia flare now which she attributes to being under increased stress around the holidays.  Discussed the importance of regular exercise and good sleep  hygiene.  Other  fatigue: Stable.   Other medical conditions are listed as follows:   History of migraine  Prolonged QT interval  History of hypothyroidism  History of diabetes insipidus  Muscle cramping  History of anxiety  Orders: Orders Placed This Encounter  Procedures   CBC with Differential/Platelet   COMPLETE METABOLIC PANEL WITH GFR   C3 and C4   Sedimentation rate   Urinalysis, Routine w reflex microscopic   Serum protein electrophoresis with reflex   Rheumatoid factor   Anti-DNA antibody, double-stranded   No orders of the defined types were placed in this encounter.    Follow-Up Instructions: Return in about 5 months (around 08/04/2022) for Sjogren's syndrome, Osteoarthritis.   Gearldine Bienenstock, PA-C  Note - This record has been created using Dragon software.  Chart creation errors have been sought, but may not always  have been located. Such creation errors do not reflect on  the standard of medical care.

## 2022-02-26 DIAGNOSIS — Z79899 Other long term (current) drug therapy: Secondary | ICD-10-CM | POA: Diagnosis not present

## 2022-02-26 DIAGNOSIS — J842 Lymphoid interstitial pneumonia: Secondary | ICD-10-CM | POA: Diagnosis not present

## 2022-02-26 DIAGNOSIS — E039 Hypothyroidism, unspecified: Secondary | ICD-10-CM | POA: Diagnosis not present

## 2022-02-26 DIAGNOSIS — M3501 Sicca syndrome with keratoconjunctivitis: Secondary | ICD-10-CM | POA: Diagnosis not present

## 2022-02-26 DIAGNOSIS — E785 Hyperlipidemia, unspecified: Secondary | ICD-10-CM | POA: Diagnosis not present

## 2022-02-26 DIAGNOSIS — D899 Disorder involving the immune mechanism, unspecified: Secondary | ICD-10-CM | POA: Diagnosis not present

## 2022-02-26 DIAGNOSIS — M3210 Systemic lupus erythematosus, organ or system involvement unspecified: Secondary | ICD-10-CM | POA: Diagnosis not present

## 2022-02-26 DIAGNOSIS — M1120 Other chondrocalcinosis, unspecified site: Secondary | ICD-10-CM | POA: Diagnosis not present

## 2022-02-26 DIAGNOSIS — D72819 Decreased white blood cell count, unspecified: Secondary | ICD-10-CM | POA: Diagnosis not present

## 2022-02-26 DIAGNOSIS — M797 Fibromyalgia: Secondary | ICD-10-CM | POA: Diagnosis not present

## 2022-02-26 DIAGNOSIS — I340 Nonrheumatic mitral (valve) insufficiency: Secondary | ICD-10-CM | POA: Diagnosis not present

## 2022-02-26 DIAGNOSIS — F419 Anxiety disorder, unspecified: Secondary | ICD-10-CM | POA: Diagnosis not present

## 2022-03-05 ENCOUNTER — Ambulatory Visit: Payer: 59 | Attending: Physician Assistant | Admitting: Physician Assistant

## 2022-03-05 ENCOUNTER — Encounter: Payer: Self-pay | Admitting: Physician Assistant

## 2022-03-05 VITALS — BP 112/74 | HR 74 | Resp 16 | Ht 67.0 in | Wt 138.0 lb

## 2022-03-05 DIAGNOSIS — M25462 Effusion, left knee: Secondary | ICD-10-CM

## 2022-03-05 DIAGNOSIS — J842 Lymphoid interstitial pneumonia: Secondary | ICD-10-CM | POA: Diagnosis not present

## 2022-03-05 DIAGNOSIS — R5383 Other fatigue: Secondary | ICD-10-CM | POA: Diagnosis not present

## 2022-03-05 DIAGNOSIS — D702 Other drug-induced agranulocytosis: Secondary | ICD-10-CM

## 2022-03-05 DIAGNOSIS — M2241 Chondromalacia patellae, right knee: Secondary | ICD-10-CM

## 2022-03-05 DIAGNOSIS — Z8659 Personal history of other mental and behavioral disorders: Secondary | ICD-10-CM

## 2022-03-05 DIAGNOSIS — Z79899 Other long term (current) drug therapy: Secondary | ICD-10-CM | POA: Diagnosis not present

## 2022-03-05 DIAGNOSIS — R9431 Abnormal electrocardiogram [ECG] [EKG]: Secondary | ICD-10-CM

## 2022-03-05 DIAGNOSIS — M797 Fibromyalgia: Secondary | ICD-10-CM

## 2022-03-05 DIAGNOSIS — Z8639 Personal history of other endocrine, nutritional and metabolic disease: Secondary | ICD-10-CM

## 2022-03-05 DIAGNOSIS — R252 Cramp and spasm: Secondary | ICD-10-CM

## 2022-03-05 DIAGNOSIS — M47812 Spondylosis without myelopathy or radiculopathy, cervical region: Secondary | ICD-10-CM

## 2022-03-05 DIAGNOSIS — M19042 Primary osteoarthritis, left hand: Secondary | ICD-10-CM

## 2022-03-05 DIAGNOSIS — M170 Bilateral primary osteoarthritis of knee: Secondary | ICD-10-CM

## 2022-03-05 DIAGNOSIS — M3501 Sicca syndrome with keratoconjunctivitis: Secondary | ICD-10-CM

## 2022-03-05 DIAGNOSIS — M19041 Primary osteoarthritis, right hand: Secondary | ICD-10-CM | POA: Diagnosis not present

## 2022-03-05 DIAGNOSIS — M1120 Other chondrocalcinosis, unspecified site: Secondary | ICD-10-CM | POA: Diagnosis not present

## 2022-03-05 DIAGNOSIS — Z8669 Personal history of other diseases of the nervous system and sense organs: Secondary | ICD-10-CM

## 2022-03-07 LAB — COMPLETE METABOLIC PANEL WITH GFR
AG Ratio: 1.3 (calc) (ref 1.0–2.5)
ALT: 14 U/L (ref 6–29)
AST: 24 U/L (ref 10–35)
Albumin: 4.4 g/dL (ref 3.6–5.1)
Alkaline phosphatase (APISO): 117 U/L (ref 37–153)
BUN: 15 mg/dL (ref 7–25)
CO2: 29 mmol/L (ref 20–32)
Calcium: 9.6 mg/dL (ref 8.6–10.4)
Chloride: 100 mmol/L (ref 98–110)
Creat: 0.62 mg/dL (ref 0.50–1.05)
Globulin: 3.5 g/dL (calc) (ref 1.9–3.7)
Glucose, Bld: 87 mg/dL (ref 65–99)
Potassium: 4.3 mmol/L (ref 3.5–5.3)
Sodium: 137 mmol/L (ref 135–146)
Total Bilirubin: 0.5 mg/dL (ref 0.2–1.2)
Total Protein: 7.9 g/dL (ref 6.1–8.1)
eGFR: 101 mL/min/{1.73_m2} (ref >=60)

## 2022-03-07 LAB — RHEUMATOID FACTOR: Rheumatoid fact SerPl-aCnc: <14 IU/mL (ref <14)

## 2022-03-07 LAB — CBC WITH DIFFERENTIAL/PLATELET
Absolute Monocytes: 415 cells/uL (ref 200–950)
Basophils Absolute: 10 cells/uL (ref 0–200)
Basophils Relative: 0.4 %
Eosinophils Absolute: 90 cells/uL (ref 15–500)
Eosinophils Relative: 3.6 %
HCT: 37.6 % (ref 35.0–45.0)
Hemoglobin: 12.9 g/dL (ref 11.7–15.5)
Lymphs Abs: 485 cells/uL — ABNORMAL LOW (ref 850–3900)
MCH: 29.6 pg (ref 27.0–33.0)
MCHC: 34.3 g/dL (ref 32.0–36.0)
MCV: 86.2 fL (ref 80.0–100.0)
MPV: 11 fL (ref 7.5–12.5)
Monocytes Relative: 16.6 %
Neutro Abs: 1500 cells/uL (ref 1500–7800)
Neutrophils Relative %: 60 %
Platelets: 231 10*3/uL (ref 140–400)
RBC: 4.36 10*6/uL (ref 3.80–5.10)
RDW: 13.6 % (ref 11.0–15.0)
Total Lymphocyte: 19.4 %
WBC: 2.5 10*3/uL — ABNORMAL LOW (ref 3.8–10.8)

## 2022-03-07 LAB — PROTEIN ELECTROPHORESIS, SERUM, WITH REFLEX
Albumin ELP: 4.6 g/dL (ref 3.8–4.8)
Alpha 1: 0.3 g/dL (ref 0.2–0.3)
Alpha 2: 0.8 g/dL (ref 0.5–0.9)
Beta 2: 0.4 g/dL (ref 0.2–0.5)
Beta Globulin: 0.5 g/dL (ref 0.4–0.6)
Gamma Globulin: 1.6 g/dL (ref 0.8–1.7)
Total Protein: 8.3 g/dL — ABNORMAL HIGH (ref 6.1–8.1)

## 2022-03-07 LAB — C3 AND C4
C3 Complement: 149 mg/dL (ref 83–193)
C4 Complement: 33 mg/dL (ref 15–57)

## 2022-03-07 LAB — SEDIMENTATION RATE: Sed Rate: 34 mm/h — ABNORMAL HIGH (ref 0–30)

## 2022-03-07 LAB — URINALYSIS, ROUTINE W REFLEX MICROSCOPIC
Bilirubin Urine: NEGATIVE
Glucose, UA: NEGATIVE
Hgb urine dipstick: NEGATIVE
Ketones, ur: NEGATIVE
Leukocytes,Ua: NEGATIVE
Nitrite: NEGATIVE
Protein, ur: NEGATIVE
Specific Gravity, Urine: 1.009 (ref 1.001–1.035)
pH: 7 (ref 5.0–8.0)

## 2022-03-07 LAB — ANTI-DNA ANTIBODY, DOUBLE-STRANDED: ds DNA Ab: 1 IU/mL

## 2022-03-10 ENCOUNTER — Telehealth: Payer: Self-pay | Admitting: *Deleted

## 2022-03-10 DIAGNOSIS — Z79899 Other long term (current) drug therapy: Secondary | ICD-10-CM

## 2022-03-10 NOTE — Telephone Encounter (Signed)
-----  Message from Ofilia Neas, PA-C sent at 03/10/2022 12:24 PM EST ----- WBC count is low-2.5. absolute lymphocyte count remains low.  Recommend rechecking CBC withdiff in 1 month.   Rest of CBC WNL.  CMP WNL.  SPEP did not reveal any abnormal protein bands.  UA normal.  ESR is borderline elevated-34.   Complements WNL.  RF negative.  dsDNA is negative.

## 2022-03-10 NOTE — Progress Notes (Signed)
WBC count is low-2.5. absolute lymphocyte count remains low.  Recommend rechecking CBC withdiff in 1 month.   Rest of CBC WNL.  CMP WNL.  SPEP did not reveal any abnormal protein bands.  UA normal.  ESR is borderline elevated-34.   Complements WNL.  RF negative.  dsDNA is negative.

## 2022-03-15 ENCOUNTER — Other Ambulatory Visit: Payer: Self-pay | Admitting: Rheumatology

## 2022-03-16 ENCOUNTER — Other Ambulatory Visit: Payer: Self-pay | Admitting: Rheumatology

## 2022-03-16 NOTE — Telephone Encounter (Signed)
Next Visit: 08/27/2022  Last Visit: 03/05/2022  Last Fill: 12/01/2021  DX: Sjogren's syndrome with keratoconjunctivitis sicca   Current Dose per office note 03/05/2022: Imuran 50 mg 1 tablet in the morning and half tablet in the evening.   Labs: 03/05/2022 WBC count is low-2.5. absolute lymphocyte count remains low. Rest of CBC WNL. CMP WNL.  Okay to refill Imuran?

## 2022-03-17 DIAGNOSIS — H1033 Unspecified acute conjunctivitis, bilateral: Secondary | ICD-10-CM | POA: Diagnosis not present

## 2022-03-17 NOTE — Telephone Encounter (Signed)
Next Visit: 08/27/2022   Last Visit: 03/05/2022   Last Fill: 12/01/2021  DX: Chondrocalcinosis   Current Dose per office note 03/05/2022: colchicine 0.6 mg 1 capsule by mouth daily.   Labs: 03/05/2022 WBC count is low-2.5. absolute lymphocyte count remains low. Rest of CBC WNL. CMP WNL.   Okay to refill Colchicine?

## 2022-03-20 DIAGNOSIS — R059 Cough, unspecified: Secondary | ICD-10-CM | POA: Diagnosis not present

## 2022-03-20 DIAGNOSIS — M3501 Sicca syndrome with keratoconjunctivitis: Secondary | ICD-10-CM | POA: Diagnosis not present

## 2022-03-20 DIAGNOSIS — Z1152 Encounter for screening for COVID-19: Secondary | ICD-10-CM | POA: Diagnosis not present

## 2022-03-20 DIAGNOSIS — R5383 Other fatigue: Secondary | ICD-10-CM | POA: Diagnosis not present

## 2022-03-20 DIAGNOSIS — M3210 Systemic lupus erythematosus, organ or system involvement unspecified: Secondary | ICD-10-CM | POA: Diagnosis not present

## 2022-03-20 DIAGNOSIS — R6883 Chills (without fever): Secondary | ICD-10-CM | POA: Diagnosis not present

## 2022-03-20 DIAGNOSIS — J101 Influenza due to other identified influenza virus with other respiratory manifestations: Secondary | ICD-10-CM | POA: Diagnosis not present

## 2022-03-20 DIAGNOSIS — R0981 Nasal congestion: Secondary | ICD-10-CM | POA: Diagnosis not present

## 2022-03-24 ENCOUNTER — Telehealth: Payer: Self-pay | Admitting: Rheumatology

## 2022-03-24 NOTE — Telephone Encounter (Signed)
Patient called stating she tested positive for the flu last week and prescribed Tamiflu.  Patient states she stopped her Imuran medication and requested a return call to let her know when she can restart it.

## 2022-03-24 NOTE — Telephone Encounter (Signed)
Patient advised she should hold her medicine until her symptoms have resolved.

## 2022-04-06 ENCOUNTER — Other Ambulatory Visit: Payer: Self-pay | Admitting: Physician Assistant

## 2022-05-03 DIAGNOSIS — J020 Streptococcal pharyngitis: Secondary | ICD-10-CM | POA: Diagnosis not present

## 2022-06-03 DIAGNOSIS — M3210 Systemic lupus erythematosus, organ or system involvement unspecified: Secondary | ICD-10-CM | POA: Diagnosis not present

## 2022-06-03 DIAGNOSIS — M3501 Sicca syndrome with keratoconjunctivitis: Secondary | ICD-10-CM | POA: Diagnosis not present

## 2022-06-03 DIAGNOSIS — Z1152 Encounter for screening for COVID-19: Secondary | ICD-10-CM | POA: Diagnosis not present

## 2022-06-03 DIAGNOSIS — R0981 Nasal congestion: Secondary | ICD-10-CM | POA: Diagnosis not present

## 2022-06-03 DIAGNOSIS — R5383 Other fatigue: Secondary | ICD-10-CM | POA: Diagnosis not present

## 2022-06-03 DIAGNOSIS — J0100 Acute maxillary sinusitis, unspecified: Secondary | ICD-10-CM | POA: Diagnosis not present

## 2022-06-11 ENCOUNTER — Encounter: Payer: Self-pay | Admitting: *Deleted

## 2022-06-11 ENCOUNTER — Other Ambulatory Visit: Payer: Self-pay | Admitting: Physician Assistant

## 2022-06-11 NOTE — Telephone Encounter (Signed)
Last Fill: 03/16/2022  Labs: 03/05/2022 WBC count is low-2.5. absolute lymphocyte count remains low. Rest of CBC WNL. CMP WNL.   Next Visit: 08/27/2022   Last Visit: 03/05/2022   DX: Sjogren's syndrome with keratoconjunctivitis sicca   Current Dose per office note 03/05/2022:  Imuran 50 mg 1 tablet in the morning and half tablet in the evening.   Message sent to patient via my chart to advise patient she is due to update labs.   Okay to refill Imuran?

## 2022-06-17 ENCOUNTER — Other Ambulatory Visit: Payer: Self-pay | Admitting: *Deleted

## 2022-06-17 DIAGNOSIS — Z79899 Other long term (current) drug therapy: Secondary | ICD-10-CM

## 2022-06-18 ENCOUNTER — Other Ambulatory Visit: Payer: Self-pay | Admitting: Physician Assistant

## 2022-06-18 LAB — COMPLETE METABOLIC PANEL WITH GFR
AG Ratio: 1.3 (calc) (ref 1.0–2.5)
ALT: 12 U/L (ref 6–29)
AST: 21 U/L (ref 10–35)
Albumin: 4.3 g/dL (ref 3.6–5.1)
Alkaline phosphatase (APISO): 116 U/L (ref 37–153)
BUN: 15 mg/dL (ref 7–25)
CO2: 31 mmol/L (ref 20–32)
Calcium: 9.6 mg/dL (ref 8.6–10.4)
Chloride: 101 mmol/L (ref 98–110)
Creat: 0.55 mg/dL (ref 0.50–1.05)
Globulin: 3.3 g/dL (calc) (ref 1.9–3.7)
Glucose, Bld: 91 mg/dL (ref 65–99)
Potassium: 4.4 mmol/L (ref 3.5–5.3)
Sodium: 138 mmol/L (ref 135–146)
Total Bilirubin: 0.6 mg/dL (ref 0.2–1.2)
Total Protein: 7.6 g/dL (ref 6.1–8.1)
eGFR: 104 mL/min/{1.73_m2} (ref >=60)

## 2022-06-18 LAB — CBC WITH DIFFERENTIAL/PLATELET
Absolute Monocytes: 430 cells/uL (ref 200–950)
Basophils Absolute: 10 cells/uL (ref 0–200)
Basophils Relative: 0.4 %
Eosinophils Absolute: 72 cells/uL (ref 15–500)
Eosinophils Relative: 3 %
HCT: 37.5 % (ref 35.0–45.0)
Hemoglobin: 12.5 g/dL (ref 11.7–15.5)
Lymphs Abs: 348 cells/uL — ABNORMAL LOW (ref 850–3900)
MCH: 29 pg (ref 27.0–33.0)
MCHC: 33.3 g/dL (ref 32.0–36.0)
MCV: 87 fL (ref 80.0–100.0)
MPV: 10.8 fL (ref 7.5–12.5)
Monocytes Relative: 17.9 %
Neutro Abs: 1541 cells/uL (ref 1500–7800)
Neutrophils Relative %: 64.2 %
Platelets: 257 10*3/uL (ref 140–400)
RBC: 4.31 10*6/uL (ref 3.80–5.10)
RDW: 13 % (ref 11.0–15.0)
Total Lymphocyte: 14.5 %
WBC: 2.4 10*3/uL — ABNORMAL LOW (ref 3.8–10.8)

## 2022-06-18 NOTE — Telephone Encounter (Signed)
Last Fill: 03/17/2022  Labs: 06/17/2022 WBC 2.4, Lymphs 348  Next Visit: 06/23/2022  Last Visit: 03/05/2022  DX: Chondrocalcinosis   Current Dose per office note 03/05/2022: colchicine 0.6 mg 1 capsule by mouth daily.   Okay to refill Colchicine?

## 2022-06-19 ENCOUNTER — Other Ambulatory Visit: Payer: Self-pay | Admitting: *Deleted

## 2022-06-19 DIAGNOSIS — Z79899 Other long term (current) drug therapy: Secondary | ICD-10-CM

## 2022-06-19 MED ORDER — AZATHIOPRINE 50 MG PO TABS: 50.0000 mg | ORAL_TABLET | Freq: Every day | ORAL | 2 refills | Status: DC

## 2022-06-19 NOTE — Progress Notes (Signed)
Reviewed lab work with Dr. Corliss Skains.   WBC count is low-2.4.  absolute lymphocytes are low and continue to trend down. Patient remains on low dose imuran 75 mg daily. Recommend reducing to imuran 50 mg daily.  Recheck CBC with diff in 1 month.  CMP WNL.

## 2022-06-19 NOTE — Telephone Encounter (Signed)
-----   Message from Gearldine Bienenstock, PA-C sent at 06/19/2022  8:02 AM EDT ----- Reviewed lab work with Dr. Corliss Skains.   WBC count is low-2.4.  absolute lymphocytes are low and continue to trend down. Patient remains on low dose imuran 75 mg daily. Recommend reducing to imuran 50 mg daily.  Recheck CBC with diff in 1 month.  CMP WNL.

## 2022-06-19 NOTE — Progress Notes (Unsigned)
Office Visit Note  Patient: Stefanie [name]             Date of Birth: 02/07/1961           MRN: [id number]             PCP: Creola Corn, MD Referring: Creola Corn, MD Visit Date: 06/23/2022 Occupation: @  Subjective:  Pain in multiple joints  History of Present Illness: Stefanie [name] is a 62 y.o. female tree of Sjogren's, lymphoid interstitial pneumonia, osteoarthritis, chondrocalcinosis and fibromyalgia syndrome.  She continues to have dry mouth and dry eye symptoms.  She underwent left knee joint arthroscopic surgery for meniscal tear repair in October 2023.  She States the severe left knee joint discomfort gradually improved after the surgery.  Although Dr. Deno Etienne recommended left total knee replacement due to severe osteoarthritis.  She continues to have some discomfort in her left knee joint.  She also continues to have edema in her left lower extremity.  She states she has discomfort in her lumbar spine.  She also has been experiencing discomfort in her hip joints. Intermittent shortness of breath.  She was evaluated by Dr. Isaiah Serge on October 08, 2021.  According to his notes the PFTs showed mild reduction in DLCO and is stable when compared to 2020.  He recommended to follow-up high-resolution CT and PFTs.  She has repeat PFTs scheduled in May followed by an appointment with Dr. Isaiah Serge.  She has been also followed by cardiologist. Patient reports that she has been having recurrent infections.  She has had COVID x 4 and the last infection was in November 2023.  She has had viral infections almost every month since then.  She had flu and strep throat in February 2024.  Activities of Daily Living:  Patient reports morning stiffness for all day. Patient Reports nocturnal pain.  Difficulty dressing/grooming: Reports Difficulty climbing stairs: Reports Difficulty getting out of chair: Reports Difficulty using hands for taps, buttons, cutlery, and/or writing: Reports  Review  of Systems  Constitutional:  Positive for fatigue.  HENT:  Positive for mouth dryness. Negative for mouth sores.   Eyes:  Positive for dryness.  Respiratory:  Positive for shortness of breath.   Cardiovascular:  Negative for chest pain and palpitations.  Gastrointestinal:  Positive for constipation. Negative for blood in stool and diarrhea.  Endocrine: Negative for increased urination.  Genitourinary:  Positive for involuntary urination.  Musculoskeletal:  Positive for joint pain, gait problem, joint pain, joint swelling, myalgias, muscle weakness, morning stiffness, muscle tenderness and myalgias.  Skin:  Positive for hair loss. Negative for color change, rash, redness and sensitivity to sunlight.  Allergic/Immunologic: Positive for susceptible to infections.  Neurological:  Positive for dizziness. Negative for headaches.  Hematological:  Positive for swollen glands.  Psychiatric/Behavioral:  Positive for sleep disturbance. Negative for depressed mood. The patient is nervous/anxious.     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   Prolonged QT interval 07/28/2016   Primary osteoarthritis of both hands 05/11/2016   Primary osteoarthritis of both knees  05/11/2016   History of hypothyroidism 05/11/2016   History of migraine 05/11/2016   Autoimmune disease (HCC) positive ANA, 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, 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, anticardiolipin antibody and positive rheumatoid factor 04/29/2016   ANA positive 04/29/2016   Rheumatoid factor positive 04/29/2016    Pseudogout 04/29/2016   Chondrocalcinosis 04/29/2016   High risk medication use 04/29/2016   Sjogren's syndrome with keratoconjunctivitis sicca 04/29/2016   History of neutropenia 04/29/2016   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   Hyponatremia 12/12/2013   Fever 12/13/2012   Pleuritic chest pain 12/06/2012   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   Anxiety 12/06/2012   ANEMIA-IRON DEFICIENCY 01/08/2010   GASTRIC DILATION, ACUTE 01/08/2010   NAUSEA ALONE 01/08/2010   ABDOMINAL PAIN, LEFT UPPER QUADRANT 01/08/2010    Past Medical History:  Diagnosis Date   Anemia    Arthritis    Asthma    DDD (degenerative disc disease), lumbar    Diabetes insipidus    Fibromyalgia    Headache    hx of migraines   Hypothyroidism    Lupus    Lymphocytic interstitial pneumonia    secondary to Sjogren's syndrome   Mitral valve prolapse    pt had echo on 09/04/21   PONV (postoperative nausea and vomiting)    PVC's (premature ventricular contractions)    SLE (systemic lupus erythematosus) 12/06/2012   Thyroid disease     Family History  Problem Relation Age of Onset   Atrial fibrillation Mother    Dementia Mother    Diabetes Father    Heart disease Father    Hyperlipidemia Father    Hypertension Father    Stroke Father    Diabetes Sister    Kidney disease Sister        transplant   Heart disease Sister        open heart surgery    Pancreatic disease Sister        transplant    Cancer Maternal Grandmother    Diabetes Paternal Grandfather    Heart disease Paternal Grandfather    Hyperlipidemia Paternal Grandfather    Hypertension Paternal Actor    Healthy Daughter    Healthy Son    Colon cancer Neg Hx    Esophageal cancer Neg Hx    Rectal cancer Neg Hx    Stomach cancer Neg Hx    Past Surgical History:  Procedure Laterality Date   COLONOSCOPY     KNEE ARTHROSCOPY WITH MENISCAL REPAIR Left 12/12/2021   Procedure: LEFT KNEE ARTHROSCOPIC DEBRIDEMENT, LATERAL  MENISCECTOMY;  Surgeon: Tarry Kos, MD;  Location: [facility] OR;  Service: Orthopedics;  Laterality: Left;   PITUITARY EXCISION  1993   TONSILLECTOMY     Social History   Social History Narrative   Right handed   Immunization History  Administered Date(s) Administered   Influenza Inj Mdck Quad Pf 01/21/2020   Influenza-Unspecified 09/28/2018   PFIZER(Purple Top)SARS-COV-2 Vaccination 11/17/2019, 12/13/2019     Objective: Vital Signs: BP 132/86 (BP Location: Left Arm, Patient Position: Sitting, Cuff Size: Normal)   Pulse 79   Resp 18   Ht 5\' 7"  (1.702 m)   Wt 143 lb (64.9 kg)   LMP 04/09/2014 (Approximate)   BMI 22.40 kg/m    Physical Exam Vitals and nursing note reviewed.  Constitutional:      Appearance: She is well-developed.  HENT:     Head: Normocephalic and atraumatic.  Eyes:     Conjunctiva/sclera: Conjunctivae normal.  Cardiovascular:     Rate and Rhythm:  Normal rate and regular rhythm.     Heart sounds: Normal heart sounds.  Pulmonary:     Effort: Pulmonary effort is normal.     Breath sounds: Normal breath sounds.  Abdominal:     General: Bowel sounds are normal.     Palpations: Abdomen is soft.  Musculoskeletal:     Cervical back: Normal range of motion.     Left lower leg: Edema present.  Lymphadenopathy:     Cervical: No cervical adenopathy.  Skin:    General: Skin is warm and dry.     Capillary Refill: Capillary refill takes less than 2 seconds.  Neurological:     Mental Status: She is alert and oriented to person, place, and time.  Psychiatric:        Behavior: Behavior normal.      Musculoskeletal Exam: She had limited lateral rotation of the cervical spine.  She had painful range of motion of her thoracic and lumbar spine without any point tenderness.  Shoulder joints, elbow joints, wrist joints, MCPs PIPs and DIPs were in good range of motion with no synovitis.  She had bilateral PIP and DIP thickening.  She had good range of motion of  bilateral-hip joints with discomfort.  She had good range of motion of her right knee joint.  She had painful limited flexion of her left knee joint and some limitation with extension.  Warmth and effusion was noted in the left knee joint.  Left lower extremity edema was noted.  There was no tenderness over ankles or MTPs.  CDAI Exam: CDAI Score: -- Patient Global: --; Provider Global: -- Swollen: --; Tender: -- Joint Exam 06/23/2022   No joint exam has been documented for this visit   There is currently no information documented on the homunculus. Go to the Rheumatology activity and complete the homunculus joint exam.  Investigation: No additional findings.  Imaging: No results found.  Recent Labs: Lab Results  Component Value Date   WBC 2.4 (L) 06/17/2022   HGB 12.5 06/17/2022   PLT 257 06/17/2022   NA 138 06/17/2022   K 4.4 06/17/2022   CL 101 06/17/2022   CO2 31 06/17/2022   GLUCOSE 91 06/17/2022   BUN 15 06/17/2022   CREATININE 0.55 06/17/2022   BILITOT 0.6 06/17/2022   ALKPHOS 82 05/15/2020   AST 21 06/17/2022   ALT 12 06/17/2022   PROT 7.6 06/17/2022   ALBUMIN 4.1 05/15/2020   CALCIUM 9.6 06/17/2022   GFRAA 102 04/24/2020   QFTBGOLDPLUS NEGATIVE 10/16/2019    Speciality Comments: PLQ Eye Exam 02/26/2020 WNL @ [facility] follow up in 1 year  Procedures:  No procedures performed Allergies: Iron sucrose, Morphine, Venofer [ferric oxide], Azithromycin, and Other   Assessment / Plan:     Visit Diagnoses: Sjogren's syndrome with keratoconjunctivitis sicca - Positive ANA, positive Ro, positive La, positive RF sicca symptoms, inflammatory arthritis: She continues to have pain and discomfort in multiple joints.  Swelling was noted only in the left knee joint.  She had arthroscopic surgery.  She has been taking Imuran 75 mg p.o. daily.  Recent labs showed WBC count of 2.4.  She was advised to reduce Imuran to 50 mg 1 tablet p.o. daily.  Autoimmune labs  obtained on March 05, 2022 showed sed rate was mildly elevated complements were normal and double-stranded ENA was negative.  I will recheck her autoimmune labs with her next labs in a month.  High risk medication use - Imuran 50 mg  1 tablet p.o. daily.  The dose of Imuran was reduced from 75 mg p.o. daily to 50 mg p.o. daily due to neutropenia and frequent infections.  She will come back in a month to have repeat CBC.  Information regarding vaccination was placed in the AVS.  She was advised to hold Imuran if she develops an infection.  We may consider adding prophylactic Bactrim if she continues to have recurrent infections.  Other drug-induced neutropenia  Lymphoid interstitial pneumonia - Followed by Dr. Isaiah Serge. High-resolution chest CT updated on 11/03/2021: Patient was evaluated by Dr. Isaiah Serge in August.  Her PFT showed mild reduction in DLCO and is stable when compared to 2020 PFTs.  He recommended repeat high-resolution CT in a year.  She will have follow-up appointment with them this summer.  She continues to have intermittent mild shortness of breath.  Primary osteoarthritis of both hands-she has mild PIP and DIP thickening with no synovitis.  Chronic pain of both hips -she complains of discomfort in the bilateral hip joints.  No limitation range of motion was noted.  Plan: XR HIPS BILAT W OR W/O PELVIS 3-4 VIEWS.  X-rays of bilateral hip joints were unremarkable.  Patient was notified.  Primary osteoarthritis of both knees -she was evaluated by Dr. Deno Etienne and was Raynauds wise to have bilateral total knee replacement in the future.  X-rays of the right knee were consistent with moderate osteoarthritis and moderate chondromalacia patella on 01/23/2021.  Effusion, left knee - MRI of the left knee 09/27/2021:She underwent left knee arthroscopic surgery on 12/12/2021 performed by Dr. Roda Shutters.  She has noticed some improvement in her discomfort.  She continues to have chronic pain and discomfort in the  left knee.  Warmth and swelling was noted.  Chondromalacia patellae, right knee  Chondrocalcinosis - colchicine 0.6 mg 1 capsule by mouth daily.  Cervical spondylosis without myelopathy-she had limited lateral rotation.  Fibromyalgia-she continues to realize pain and discomfort from fibromyalgia.  Other fatigue-related to fibromyalgia.  Prolonged QT interval-patient is followed by cardiology.  Other medical problems are listed as follows:  History of diabetes insipidus  Muscle cramping  History of hypothyroidism  History of migraine  History of anxiety  Orders: Orders Placed This Encounter  Procedures   XR HIPS BILAT W OR W/O PELVIS 3-4 VIEWS   No orders of the defined types were placed in this encounter.   Follow-Up Instructions: Return in about 5 months (around 11/23/2022) for Sjogren's, Osteoarthritis.   Pollyann Savoy, MD  Note - This record has been created using Animal nutritionist.  Chart creation errors have been sought, but may not always  have been located. Such creation errors do not reflect on  the standard of medical care.

## 2022-06-23 ENCOUNTER — Ambulatory Visit: Payer: 59 | Attending: Rheumatology | Admitting: Rheumatology

## 2022-06-23 ENCOUNTER — Encounter: Payer: Self-pay | Admitting: Rheumatology

## 2022-06-23 ENCOUNTER — Ambulatory Visit (INDEPENDENT_AMBULATORY_CARE_PROVIDER_SITE_OTHER): Payer: 59

## 2022-06-23 VITALS — BP 132/86 | HR 79 | Resp 18 | Ht 67.0 in | Wt 143.0 lb

## 2022-06-23 DIAGNOSIS — M25462 Effusion, left knee: Secondary | ICD-10-CM

## 2022-06-23 DIAGNOSIS — M19042 Primary osteoarthritis, left hand: Secondary | ICD-10-CM

## 2022-06-23 DIAGNOSIS — M3501 Sicca syndrome with keratoconjunctivitis: Secondary | ICD-10-CM | POA: Diagnosis not present

## 2022-06-23 DIAGNOSIS — M25552 Pain in left hip: Secondary | ICD-10-CM

## 2022-06-23 DIAGNOSIS — Z79899 Other long term (current) drug therapy: Secondary | ICD-10-CM

## 2022-06-23 DIAGNOSIS — J842 Lymphoid interstitial pneumonia: Secondary | ICD-10-CM

## 2022-06-23 DIAGNOSIS — M25551 Pain in right hip: Secondary | ICD-10-CM | POA: Diagnosis not present

## 2022-06-23 DIAGNOSIS — Z8669 Personal history of other diseases of the nervous system and sense organs: Secondary | ICD-10-CM

## 2022-06-23 DIAGNOSIS — R5383 Other fatigue: Secondary | ICD-10-CM

## 2022-06-23 DIAGNOSIS — M1120 Other chondrocalcinosis, unspecified site: Secondary | ICD-10-CM | POA: Diagnosis not present

## 2022-06-23 DIAGNOSIS — M797 Fibromyalgia: Secondary | ICD-10-CM

## 2022-06-23 DIAGNOSIS — R252 Cramp and spasm: Secondary | ICD-10-CM

## 2022-06-23 DIAGNOSIS — D702 Other drug-induced agranulocytosis: Secondary | ICD-10-CM | POA: Diagnosis not present

## 2022-06-23 DIAGNOSIS — Z8659 Personal history of other mental and behavioral disorders: Secondary | ICD-10-CM

## 2022-06-23 DIAGNOSIS — G8929 Other chronic pain: Secondary | ICD-10-CM

## 2022-06-23 DIAGNOSIS — M170 Bilateral primary osteoarthritis of knee: Secondary | ICD-10-CM

## 2022-06-23 DIAGNOSIS — M2241 Chondromalacia patellae, right knee: Secondary | ICD-10-CM | POA: Diagnosis not present

## 2022-06-23 DIAGNOSIS — M19041 Primary osteoarthritis, right hand: Secondary | ICD-10-CM

## 2022-06-23 DIAGNOSIS — M47812 Spondylosis without myelopathy or radiculopathy, cervical region: Secondary | ICD-10-CM

## 2022-06-23 DIAGNOSIS — R9431 Abnormal electrocardiogram [ECG] [EKG]: Secondary | ICD-10-CM

## 2022-06-23 DIAGNOSIS — Z8639 Personal history of other endocrine, nutritional and metabolic disease: Secondary | ICD-10-CM

## 2022-06-23 NOTE — Patient Instructions (Signed)
Standing Labs We placed an order today for your standing lab work.   Please have your standing labs drawn in July and every 3 months  Please have your labs drawn 2 weeks prior to your appointment so that the provider can discuss your lab results at your appointment, if possible.  Please note that you may see your imaging and lab results in MyChart before we have reviewed them. We will contact you once all results are reviewed. Please allow our office up to 72 hours to thoroughly review all of the results before contacting the office for clarification of your results.  WALK-IN LAB HOURS  Monday through Thursday from 8:00 am -12:30 pm and 1:00 pm-5:00 pm and Friday from 8:00 am-12:00 pm.  Patients with office visits requiring labs will be seen before walk-in labs.  You may encounter longer than normal wait times. Please allow additional time. Wait times may be shorter on  Monday and Thursday afternoons.  We do not book appointments for walk-in labs. We appreciate your patience and understanding with our staff.   Labs are drawn by [facility]. Please bring your co-pay at the time of your lab draw.  You may receive a bill from [facility] for your lab work.  Please note if you are on Hydroxychloroquine and and an order has been placed for a Hydroxychloroquine level,  you will need to have it drawn 4 hours or more after your last dose.  If you wish to have your labs drawn at another location, please call the office 24 hours in advance so we can fax the orders.  The office is located at [address], [address], [place], [place] [address]   If you have any questions regarding directions or hours of operation,  please call [phone].   As a reminder, please drink plenty of water prior to coming for your lab work. Thanks!   Vaccines You are taking a medication(s) that can suppress your immune system.  The following immunizations are recommended: Flu annually Covid-19  Td/Tdap (tetanus, diphtheria,  pertussis) every 10 years Pneumonia (Prevnar 15 then Pneumovax 23 at least 1 year apart.  Alternatively, can take Prevnar 20 without needing additional dose) Shingrix: 2 doses from 4 weeks to 6 months apart  Please check with your PCP to make sure you are up to date.   If you have signs or symptoms of an infection or start antibiotics: First, call your PCP for workup of your infection. Hold your medication through the infection, until you complete your antibiotics, and until symptoms resolve if you take the following: Injectable medication (Actemra, Benlysta, Cimzia, Cosentyx, Enbrel, Humira, Kevzara, Orencia, Remicade, Simponi, Stelara, Taltz, Tremfya) Methotrexate Leflunomide (Arava) Mycophenolate (Cellcept) Xeljanz, Olumiant, or Rinvoq  

## 2022-07-30 ENCOUNTER — Ambulatory Visit (INDEPENDENT_AMBULATORY_CARE_PROVIDER_SITE_OTHER): Payer: 59 | Admitting: Pulmonary Disease

## 2022-07-30 DIAGNOSIS — M3501 Sicca syndrome with keratoconjunctivitis: Secondary | ICD-10-CM | POA: Diagnosis not present

## 2022-07-30 DIAGNOSIS — R0609 Other forms of dyspnea: Secondary | ICD-10-CM

## 2022-07-30 LAB — PULMONARY FUNCTION TEST
DL/VA % pred: 97 %
DL/VA: 4.04 ml/min/mmHg/L
DLCO cor % pred: 93 %
DLCO cor: 19.95 ml/min/mmHg
DLCO unc % pred: 90 %
DLCO unc: 19.38 ml/min/mmHg
FEF 25-75 Post: 2.45 L/sec
FEF 25-75 Pre: 2.76 L/sec
FEF2575-%Change-Post: -11 %
FEF2575-%Pred-Post: 102 %
FEF2575-%Pred-Pre: 115 %
FEV1-%Change-Post: -1 %
FEV1-%Pred-Post: 95 %
FEV1-%Pred-Pre: 97 %
FEV1-Post: 2.57 L
FEV1-Pre: 2.62 L
FEV1FVC-%Change-Post: 0 %
FEV1FVC-%Pred-Pre: 104 %
FEV6-%Change-Post: 0 %
FEV6-%Pred-Post: 94 %
FEV6-%Pred-Pre: 95 %
FEV6-Post: 3.19 L
FEV6-Pre: 3.22 L
FEV6FVC-%Change-Post: 0 %
FEV6FVC-%Pred-Post: 103 %
FEV6FVC-%Pred-Pre: 103 %
FVC-%Change-Post: -1 %
FVC-%Pred-Post: 91 %
FVC-%Pred-Pre: 92 %
FVC-Post: 3.19 L
FVC-Pre: 3.24 L
Post FEV1/FVC ratio: 81 %
Post FEV6/FVC ratio: 100 %
Pre FEV1/FVC ratio: 81 %
Pre FEV6/FVC Ratio: 100 %
RV % pred: 133 %
RV: 2.84 L
TLC % pred: 113 %
TLC: 6.07 L

## 2022-07-30 NOTE — Progress Notes (Signed)
Full PFT performed today. °

## 2022-07-30 NOTE — Patient Instructions (Signed)
Full PFT performed today. °

## 2022-07-31 ENCOUNTER — Ambulatory Visit: Payer: 59 | Admitting: Pulmonary Disease

## 2022-08-04 ENCOUNTER — Ambulatory Visit: Payer: 59 | Admitting: Pulmonary Disease

## 2022-08-04 ENCOUNTER — Encounter: Payer: Self-pay | Admitting: Pulmonary Disease

## 2022-08-04 VITALS — BP 114/60 | HR 84 | Temp 98.3°F | Ht 66.0 in | Wt 146.0 lb

## 2022-08-04 DIAGNOSIS — J842 Lymphoid interstitial pneumonia: Secondary | ICD-10-CM

## 2022-08-04 DIAGNOSIS — M3501 Sicca syndrome with keratoconjunctivitis: Secondary | ICD-10-CM

## 2022-08-04 DIAGNOSIS — R0609 Other forms of dyspnea: Secondary | ICD-10-CM

## 2022-08-04 NOTE — Patient Instructions (Signed)
I am glad you are doing well with your breathing Your lung function tests are actually improved over last year. Will get a high-res CT in 6 months Return to clinic in 6 months after CT scan

## 2022-08-04 NOTE — Progress Notes (Signed)
Stefanie [name]    [id number]    [birth year]  Primary Care Physician:Russo, Jonny Ruiz, MD  Referring Physician: Creola Corn, MD [address] [place],  [place] [address]  Problem list:  Follow-up for Sjogren's syndrome, on Imuran since 2021 ILD, lymphocytic interstitial pneumonia  HPI: 62 year old with history of asthma, palpitations, Sjogrens syndrome Sent in for evaluation of chronic dyspnea on exertion for the past few years associated with chest tightness, occasional wheezing She is on albuterol inhaler for asthma which she uses very seldom Also has seasonal allergies for which she uses over-the-counter antihistamines.  Follows with Dr. Corliss Skains, rheumatology with positive ANA, Ro, La, Cardiolipin and rheumatoid factor.   Started on Imuran in 2021 rheumatology  She contracted COVID twice in February 2023 treated with Paxlovid and molnupiravir and then had flu infection.  Overall improved since this episode.  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  No mold, hot tub, Jacuzzi.  No down pillows or comforter Smoking history: Never smoker Travel history: No significant travel history Relevant family history: No significant family history of lung disease  Interim history: Here for follow-up of ILD related to Sjogren's syndrome and review of recent PFTs Continues on Imuran and colchicine.  Taken off Plaquenil by rheumatology Breathing is stable with no issues   Outpatient Encounter Medications as of 08/04/2022  Medication Sig   acetaminophen (TYLENOL) 500 MG tablet Take 1,000 mg by mouth every 6 (six) hours as needed for moderate pain.   albuterol (PROVENTIL HFA;VENTOLIN HFA) 108 (90 BASE) MCG/ACT inhaler Inhale 1 puff into the lungs every 6 (six) hours as needed for wheezing or shortness of breath.   azaTHIOprine (IMURAN) 50 MG tablet Take 1 tablet (50 mg total) by mouth daily.    HYDROcodone-acetaminophen (NORCO) 5-325 MG tablet Take 1 tablet by mouth 3 (three) times daily as needed. To be taken after surgery   ibuprofen (ADVIL) 200 MG tablet Take 200 mg by mouth every 6 (six) hours as needed.   levothyroxine (SYNTHROID, LEVOTHROID) 125 MCG tablet Take 125 mcg by mouth daily before breakfast.   MITIGARE 0.6 MG CAPS TAKE 1 CAPSULE BY MOUTH EVERY DAY   Polyvinyl Alcohol-Povidone (REFRESH OP) Place 1 drop into both eyes as needed (dry eyes).   No facility-administered encounter medications on file as of 08/04/2022.   Physical Exam: Blood pressure 114/60, pulse 84, temperature 98.3 F (36.8 C), temperature source Oral, height 5\' 6"  (1.676 m), weight 146 lb (66.2 kg), last menstrual period 04/09/2014, SpO2 99 %. Gen:      No acute distress HEENT:  EOMI, sclera anicteric Neck:     No masses; no thyromegaly Lungs:    Clear to auscultation bilaterally; normal respiratory effort CV:         Regular rate and rhythm; no murmurs Abd:      + bowel sounds; soft, non-tender; no palpable masses, no distension Ext:    No edema; adequate peripheral perfusion Skin:      Warm and dry; no rash Neuro: alert and oriented x 3 Psych: normal mood and affect   Data Reviewed: Imaging: CTA 08/16/2014-mild right middle lobe bronchovascular opacities with tree-in-bud, scattered lung cysts. High-res CT 08/01/2019-cystic and nodular interstitial lung disease consistent with lymphocytic interstitial pneumonia.  Alternate diagnosis. CTA 03/26/2021-no pulmonary embolism, stable bullous changes, pulmonary nodules High resolution CT 11/03/2021-scattered cysts and pulmonary nodules which are stable consistent with LMP.  No evidence of  progression. I have reviewed the images personally.  PFTs: 04/14/2018 FVC 3.59 [99%], FEV1 2.88 [102%], F/F 80, TLC 8.09 [151%], DLCO 17.13 [78%]  10/06/2019 FVC 3.48 [90%], FEV1 2.77 [93%], F/F 80, TLC 5.94 [104%], DLCO 20.28 [87%]  06/14/20 FVC 3.54 [92%], FEV1 2.77  [93%], F/F 78, DLCO 17.81 [77%] Minimal diffusion defect  07/30/2022 FVC 3.19 [91%], FEV1 2.57 [95%], F/F81, TLC 6.07 [113%], DLCO 19.38 [90%] Normal test  Labs: CBC 06/01/2019-WBC 4.2, eos 2.4%, absolute eosinophil count 101  Assessment:  Lymphocytic interstitial pneumonia in the setting of Sjogren's syndrome Continue on Imuran, Plaquenil and colchicine per rheumatology PFTs reviewed with improvement in diffusion capacity.  Overall they look normal.  Reassured the patient. Symptomatically she is doing well  Follow-up high-res CT in 6 months  Mild asthma She has history of asthma but has mild symptoms does not require controller medication and no peripheral eosinophilia  Plan/Recommendations: High-resolution in 6 months  Chilton Greathouse MD [facility] 08/04/2022, 4:13 PM  CC: Creola Corn, MD

## 2022-08-06 DIAGNOSIS — E039 Hypothyroidism, unspecified: Secondary | ICD-10-CM | POA: Diagnosis not present

## 2022-08-06 DIAGNOSIS — E559 Vitamin D deficiency, unspecified: Secondary | ICD-10-CM | POA: Diagnosis not present

## 2022-08-06 DIAGNOSIS — D72819 Decreased white blood cell count, unspecified: Secondary | ICD-10-CM | POA: Diagnosis not present

## 2022-08-06 DIAGNOSIS — E785 Hyperlipidemia, unspecified: Secondary | ICD-10-CM | POA: Diagnosis not present

## 2022-08-13 DIAGNOSIS — D72819 Decreased white blood cell count, unspecified: Secondary | ICD-10-CM | POA: Diagnosis not present

## 2022-08-13 DIAGNOSIS — I340 Nonrheumatic mitral (valve) insufficiency: Secondary | ICD-10-CM | POA: Diagnosis not present

## 2022-08-13 DIAGNOSIS — M1120 Other chondrocalcinosis, unspecified site: Secondary | ICD-10-CM | POA: Diagnosis not present

## 2022-08-13 DIAGNOSIS — E039 Hypothyroidism, unspecified: Secondary | ICD-10-CM | POA: Diagnosis not present

## 2022-08-13 DIAGNOSIS — Z1331 Encounter for screening for depression: Secondary | ICD-10-CM | POA: Diagnosis not present

## 2022-08-13 DIAGNOSIS — M3501 Sicca syndrome with keratoconjunctivitis: Secondary | ICD-10-CM | POA: Diagnosis not present

## 2022-08-13 DIAGNOSIS — M3210 Systemic lupus erythematosus, organ or system involvement unspecified: Secondary | ICD-10-CM | POA: Diagnosis not present

## 2022-08-13 DIAGNOSIS — Z0000 Encounter for general adult medical examination without abnormal findings: Secondary | ICD-10-CM | POA: Diagnosis not present

## 2022-08-13 DIAGNOSIS — R82998 Other abnormal findings in urine: Secondary | ICD-10-CM | POA: Diagnosis not present

## 2022-08-13 DIAGNOSIS — E559 Vitamin D deficiency, unspecified: Secondary | ICD-10-CM | POA: Diagnosis not present

## 2022-08-13 DIAGNOSIS — J842 Lymphoid interstitial pneumonia: Secondary | ICD-10-CM | POA: Diagnosis not present

## 2022-08-13 DIAGNOSIS — E232 Diabetes insipidus: Secondary | ICD-10-CM | POA: Diagnosis not present

## 2022-08-13 DIAGNOSIS — I700 Atherosclerosis of aorta: Secondary | ICD-10-CM | POA: Diagnosis not present

## 2022-08-13 DIAGNOSIS — M797 Fibromyalgia: Secondary | ICD-10-CM | POA: Diagnosis not present

## 2022-08-27 ENCOUNTER — Ambulatory Visit: Payer: 59 | Admitting: Rheumatology

## 2022-09-09 ENCOUNTER — Other Ambulatory Visit: Payer: Self-pay | Admitting: Physician Assistant

## 2022-09-09 NOTE — Progress Notes (Signed)
Office Visit Note  Patient: Stefanie [name]             Date of Birth: 1961/03/02           MRN: [id number]             PCP: Creola Corn, MD Referring: Creola Corn, MD Visit Date: 09/23/2022 Occupation: @GUAROCC @  Subjective:  Pain in multiple joints   History of Present Illness: Stefanie [name] is a 62 y.o. female with history of sjogren's syndrome and osteoarthritis.  Patient remains on Imuran 50 mg 1 tablet p.o. daily and Mitigare 0.6 mg 1 capsule by mouth daily.  She is tolerating both medications without any side effects and has not missed any doses recently.  Patient continues to have chronic pain in the left knee joint.  Patient is planning to schedule a follow-up visit with Dr. Roda Shutters to discuss the next steps in treatment.  She is apprehensive to proceed with a knee replacement at this time but is considering Visco gel injections or bracing in the future.  She states she has noticed some increased discomfort in both hips which she attributes to changes in her gait.  She states for the past 1 week she has been experiencing significant discomfort in the left heel.  She states the pain is most severe with the first steps rising from bed or when rising from a seated position after sitting for prolonged periods of time.  She is taking Tylenol, Advil, and hydrocodone as needed for pain relief.  Patient states that she has started to drink ginger turmeric tea which she has found to be helpful.  She continues to have chronic sicca symptoms and is using refresh eyedrops for symptomatic relief.  She continues to have generalized myalgias and muscle tenderness due to fibromyalgia.  She is currently experiencing increased muscle fatigue and myofascial pain which she attributes to overdoing it yesterday.   Activities of Daily Living:  Patient reports morning stiffness for several hours.   Patient Reports nocturnal pain.  Difficulty dressing/grooming: Reports Difficulty climbing stairs:  Reports Difficulty getting out of chair: Reports Difficulty using hands for taps, buttons, cutlery, and/or writing: Reports  Review of Systems  Constitutional:  Positive for fatigue.  HENT:  Positive for mouth dryness. Negative for mouth sores and nose dryness.   Eyes:  Positive for dryness. Negative for pain and visual disturbance.  Respiratory:  Negative for cough, hemoptysis, shortness of breath and difficulty breathing.   Cardiovascular:  Positive for irregular heartbeat. Negative for chest pain, palpitations, hypertension and swelling in legs/feet.  Gastrointestinal:  Positive for constipation. Negative for blood in stool and diarrhea.  Endocrine: Negative for increased urination.  Genitourinary:  Negative for painful urination and involuntary urination.  Musculoskeletal:  Positive for joint pain, gait problem, joint pain, joint swelling, myalgias, muscle weakness, morning stiffness, muscle tenderness and myalgias.  Skin:  Positive for rash and sensitivity to sunlight. Negative for color change, pallor, hair loss, nodules/bumps, skin tightness and ulcers.  Allergic/Immunologic: Positive for susceptible to infections.  Neurological:  Positive for weakness. Negative for dizziness, numbness and headaches.  Hematological:  Negative for swollen glands.  Psychiatric/Behavioral:  Positive for sleep disturbance. Negative for depressed mood. The patient is nervous/anxious.     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   Prolonged QT  interval 07/28/2016   Primary osteoarthritis of both hands 05/11/2016   Primary osteoarthritis of both knees 05/11/2016   History of hypothyroidism 05/11/2016   History of migraine 05/11/2016   Autoimmune disease (HCC) positive ANA, 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, 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, anticardiolipin antibody and positive rheumatoid factor 04/29/2016   ANA positive 04/29/2016   Rheumatoid factor positive 04/29/2016   Pseudogout 04/29/2016   Chondrocalcinosis 04/29/2016   High risk medication use 04/29/2016   Sjogren's syndrome with keratoconjunctivitis sicca (HCC) 04/29/2016   History of neutropenia 04/29/2016   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   Hyponatremia 12/12/2013   Fever 12/13/2012   Pleuritic chest pain 12/06/2012   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   Anxiety 12/06/2012   ANEMIA-IRON DEFICIENCY 01/08/2010   GASTRIC DILATION, ACUTE 01/08/2010   NAUSEA ALONE 01/08/2010   ABDOMINAL PAIN, LEFT UPPER QUADRANT 01/08/2010    Past Medical History:  Diagnosis Date   Anemia    Arthritis    Asthma    DDD (degenerative disc disease), lumbar    Diabetes insipidus (HCC)    Fibromyalgia    Headache    hx of migraines   Hypothyroidism    Lupus (HCC)    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   Mitral valve prolapse    pt had echo on 09/04/21   PONV (postoperative nausea and vomiting)    PVC's (premature ventricular contractions)    SLE (systemic lupus erythematosus) (HCC) 12/06/2012   Thyroid disease     Family History  Problem Relation Age of Onset   Atrial fibrillation Mother    Dementia Mother    Diabetes Father    Heart disease Father    Hyperlipidemia Father    Hypertension Father    Stroke Father    Diabetes Sister    Kidney disease Sister        transplant   Heart disease Sister        open heart surgery    Pancreatic disease Sister        transplant    Cancer Maternal Grandmother    Diabetes Paternal Grandfather    Heart disease  Paternal Grandfather    Hyperlipidemia Paternal Grandfather    Hypertension Paternal Actor    Healthy Daughter    Healthy Son    Colon cancer Neg Hx    Esophageal cancer Neg Hx    Rectal cancer Neg Hx    Stomach cancer Neg Hx    Past Surgical History:  Procedure Laterality Date   COLONOSCOPY     KNEE ARTHROSCOPY WITH MENISCAL REPAIR Left 12/12/2021   Procedure: LEFT KNEE ARTHROSCOPIC DEBRIDEMENT, LATERAL MENISCECTOMY;  Surgeon: Tarry Kos, MD;  Location: [facility] OR;  Service: Orthopedics;  Laterality: Left;   PITUITARY EXCISION  1993   TONSILLECTOMY     Social History   Social History Narrative   Right handed   Immunization History  Administered Date(s) Administered   Influenza Inj Mdck Quad Pf 01/21/2020   Influenza-Unspecified 09/28/2018   PFIZER(Purple Top)SARS-COV-2 Vaccination 11/17/2019, 12/13/2019     Objective: Vital Signs: BP 122/79 (BP Location: Left Arm, Patient Position: Sitting, Cuff Size: Normal)   Pulse 80   Resp (!) 80   Ht 5\' 6"  (  1.676 m)   Wt 147 lb (66.7 kg)   LMP 04/09/2014 (Approximate)   BMI 23.73 kg/m    Physical Exam Vitals and nursing note reviewed.  Constitutional:      Appearance: She is well-developed.  HENT:     Head: Normocephalic and atraumatic.  Eyes:     Conjunctiva/sclera: Conjunctivae normal.  Cardiovascular:     Rate and Rhythm: Normal rate and regular rhythm.     Heart sounds: Normal heart sounds.  Pulmonary:     Effort: Pulmonary effort is normal.     Breath sounds: Normal breath sounds.  Abdominal:     General: Bowel sounds are normal.     Palpations: Abdomen is soft.  Musculoskeletal:     Cervical back: Normal range of motion.  Lymphadenopathy:     Cervical: No cervical adenopathy.  Skin:    General: Skin is warm and dry.     Capillary Refill: Capillary refill takes less than 2 seconds.  Neurological:     Mental Status: She is alert and oriented to person, place, and time.  Psychiatric:        Behavior:  Behavior normal.      Musculoskeletal Exam: Generalized hyperalgesia and positive tender points on exam.  Shoulder joints have limited range of motion with lateral rotation.  Trapezius muscle tension tenderness bilaterally.  Some midline spinal tenderness in the cervical region.  No SI joint tenderness.  Shoulder joints, elbow joints, wrist joints, MCPs, PIPs, DIPs have good range of motion with no synovitis.  Complete fist formation bilaterally.  Hip joints have good range of motion with some discomfort.  Right knee joint has good range of motion with no warmth or effusion.  Warmth and swelling noted in the left knee with limited extension.  Ankle joints have good range of motion with no joint tenderness or synovitis.  Tenderness over the plantar fascia of the left foot.  Increased pedal edema noted in the left lower extremity.  No tenderness or synovitis over MTP joints.  CDAI Exam: CDAI Score: -- Patient Global: --; Provider Global: -- Swollen: --; Tender: -- Joint Exam 09/23/2022   No joint exam has been documented for this visit   There is currently no information documented on the homunculus. Go to the Rheumatology activity and complete the homunculus joint exam.  Investigation: No additional findings.  Imaging: No results found.  Recent Labs: Lab Results  Component Value Date   WBC 2.4 (L) 06/17/2022   HGB 12.5 06/17/2022   PLT 257 06/17/2022   NA 138 06/17/2022   K 4.4 06/17/2022   CL 101 06/17/2022   CO2 31 06/17/2022   GLUCOSE 91 06/17/2022   BUN 15 06/17/2022   CREATININE 0.55 06/17/2022   BILITOT 0.6 06/17/2022   ALKPHOS 82 05/15/2020   AST 21 06/17/2022   ALT 12 06/17/2022   PROT 7.6 06/17/2022   ALBUMIN 4.1 05/15/2020   CALCIUM 9.6 06/17/2022   GFRAA 102 04/24/2020   QFTBGOLDPLUS NEGATIVE 10/16/2019    Speciality Comments: PLQ Eye Exam 02/26/2020 WNL @ [facility] follow up in 1 year  Procedures:  No procedures performed Allergies: Iron  sucrose, Morphine, Venofer [ferric oxide], Azithromycin, and Other     Assessment / Plan:     Visit Diagnoses: Sjogren's syndrome with keratoconjunctivitis sicca (HCC) - Positive ANA, positive Ro, positive La, positive RF sicca symptoms, inflammatory arthritis: Patient is currently taking Imuran 50 mg 1 tablet by mouth daily.  The dose of Imuran was reduced after  her last lab work on 06/17/2022 due to ongoing lymphopenia.  She has not developed any new or worsening symptoms on the reduced dose of Imuran.  Patient continues to have generalized pain consistent with myofascial pain.  She is also having increased discomfort in the left knee joint due to underlying osteoarthritis.  She takes Advil, Tylenol, and hydrocodone as needed for pain relief. She continues to have chronic sicca symptoms which have been tolerable.  Discussed the increased risk for developing lymphoma in patients with Sjogren's syndrome.  Plan to obtain SPEP today. Lab work from 03/05/2022 was reviewed today in the office: Double-stranded DNA negative, RF negative, complements within normal limits, sed rate 34, SPEP did not reveal any abnormal protein bands, UA normal. The following lab work will be obtained today for further evaluation.  She will remain on Imuran as prescribed.  She has been to notify us if she develops signs or symptoms of a flare.  She will follow-up in the office in 5 months or sooner if needed.  - Plan: CBC with Differential/Platelet, COMPLETE METABOLIC PANEL WITH GFR, Protein / creatinine ratio, urine, Anti-DNA antibody, double-stranded, C3 and C4, Sedimentation rate, ANA, Rheumatoid factor, Serum protein electrophoresis with reflex  High risk medication use - Imuran 50 mg 1 tablet by mouth daily-reduced due to neutropenia. CBC and CMP updated on 06/17/22.  Absolute lymphocyte count remain low and total white blood cell count dropped to 2.4 at which time the patient was advised to reduce Imuran to 50 mg 1 tablet  daily.  Orders for CBC and CMP released today.  - Plan: CBC with Differential/Platelet, COMPLETE METABOLIC PANEL WITH GFR  Other drug-induced neutropenia (HCC) - Lymphopenia--WBC count 2.4 and absolute lymphocytes 348 on 06/17/22.  Dose of imuran reduced to 50 mg daily.  CBC with diff updated day. 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 Isaiah Serge. High-resolution chest CT updated on 11/03/2021: PFT-mild reduction in DLCO-stable 2020 PFTs. Reevaluated by Dr. Isaiah Serge on 08/04/2022 PFTs had improved and diffusion capacity.  Patient was reassured and was advised to follow-up in 6 months with a high-resolution chest CT. No new or worsening pulmonary symptoms.  Primary osteoarthritis of both hands: PIP and DIP thickening consistent with osteoarthritis of both hands.  No synovitis noted.  Complete fist formation bilaterally. A list of natural antiinflammatories were provided to the patient today.   Chronic pain of both hips: Patient has been experiencing increased discomfort in both hips.  On examination she has good range of motion of both hip joints with no groin pain.  Primary osteoarthritis of both knees: Patient presents today with ongoing pain in the left knee joint.  She has ongoing in the left knee as well as limited flexion extension.  She is not yet ready to proceed with a left knee replacement but plans on following up with Dr. Roda Shutters to discuss other treatment options including viscosupplementation, bracing, or physical therapy.  Effusion, left knee - MRI of the left knee 09/27/2021:She underwent left knee arthroscopic surgery on 12/12/2021 performed by Dr. Roda Shutters.  Chronic pain and inflammation.  Limited flexion and extension with discomfort.  She is not ready to proceed with a knee replacement at this time.  Chondromalacia patellae, right knee: Crepitus noted.  No warmth or effusion noted in the right knee today.   Chondrocalcinosis: She remains on  Mitigare 0.6 mg 1 capsule by mouth daily.  A refill was sent to the pharmacy today.  Cervical spondylosis without myelopathy:  Chronic pain.  Limited range of motion with lateral rotation.  Trapezius muscle tension and tenderness bilaterally.  Plantar fasciitis of left foot: Patient is experiencing new onset plantar fasciitis in the left foot.  Her symptoms started on Friday and have not improved.  Her symptoms are most severe first thing in the morning when rising from bed or after sitting for prolonged periods of time and standing up to walk.  On examination she has tenderness along the left plantar fascia.  Discussed the diagnosis as well as treatment options.  Encouraged patient to wear proper fitting shoes with heel support and to perform stretching exercises.  She was given a handout of exercises to perform.  Also discussed the use of topical agents--she was given 2 salonpas samples to try. If her symptoms persist or worsen recommended following up at [facility].   Fibromyalgia: Patient has generalized hyperalgesia and positive tender points on exam.  Discussed the importance of regular exercise and good sleep hygiene.  Other fatigue: Patient continues to experience fatigue which she attributes to interrupted sleep at night.  Discussed the importance of regular exercise and good sleep hygiene.  Other medical conditions are listed as follows:   Prolonged QT interval  History of diabetes insipidus  History of hypothyroidism  History of migraine  History of anxiety  Orders: Orders Placed This Encounter  Procedures   CBC with Differential/Platelet   COMPLETE METABOLIC PANEL WITH GFR   Protein / creatinine ratio, urine   Anti-DNA antibody, double-stranded   C3 and C4   Sedimentation rate   ANA   Rheumatoid factor   Serum protein electrophoresis with reflex   Meds ordered this encounter  Medications   Colchicine (MITIGARE) 0.6 MG CAPS    Sig: Take 1 capsule (0.6 mg  total) by mouth daily.    Dispense:  90 capsule    Refill:  0     Follow-Up Instructions: Return in about 5 months (around 02/23/2023) for Sjogren's syndrome, Osteoarthritis.   Gearldine Bienenstock, PA-C  Note - This record has been created using Dragon software.  Chart creation errors have been sought, but may not always  have been located. Such creation errors do not reflect on  the standard of medical care.

## 2022-09-09 NOTE — Telephone Encounter (Signed)
Last Fill: 06/19/2022  Labs: 06/17/2022 Reviewed lab work with Dr. Corliss Skains.   WBC count is low-2.4.  absolute lymphocytes are low and continue to trend down. Patient remains on low dose imuran 75 mg daily. Recommend reducing to imuran 50 mg daily.  Recheck CBC with diff in 1 month. CMP WNL.  Next Visit: 09/23/2022  Last Visit: 06/23/2022  DX: Sjogren's syndrome with keratoconjunctivitis sicca   Current Dose per office note 06/23/2022: Imuran 50 mg 1 tablet p.o. daily.  The dose of Imuran was reduced from 75 mg p.o. daily to 50 mg p.o. daily due to neutropenia and frequent infections.   Dose on   Okay to refill Imuran?

## 2022-09-23 ENCOUNTER — Encounter: Payer: Self-pay | Admitting: Physician Assistant

## 2022-09-23 ENCOUNTER — Ambulatory Visit: Payer: 59 | Attending: Physician Assistant | Admitting: Physician Assistant

## 2022-09-23 VITALS — BP 122/79 | HR 80 | Resp 80 | Ht 66.0 in | Wt 147.0 lb

## 2022-09-23 DIAGNOSIS — M1120 Other chondrocalcinosis, unspecified site: Secondary | ICD-10-CM

## 2022-09-23 DIAGNOSIS — Z8669 Personal history of other diseases of the nervous system and sense organs: Secondary | ICD-10-CM

## 2022-09-23 DIAGNOSIS — G8929 Other chronic pain: Secondary | ICD-10-CM

## 2022-09-23 DIAGNOSIS — J842 Lymphoid interstitial pneumonia: Secondary | ICD-10-CM | POA: Diagnosis not present

## 2022-09-23 DIAGNOSIS — Z8659 Personal history of other mental and behavioral disorders: Secondary | ICD-10-CM

## 2022-09-23 DIAGNOSIS — Z79899 Other long term (current) drug therapy: Secondary | ICD-10-CM | POA: Diagnosis not present

## 2022-09-23 DIAGNOSIS — M722 Plantar fascial fibromatosis: Secondary | ICD-10-CM | POA: Diagnosis not present

## 2022-09-23 DIAGNOSIS — M47812 Spondylosis without myelopathy or radiculopathy, cervical region: Secondary | ICD-10-CM

## 2022-09-23 DIAGNOSIS — M170 Bilateral primary osteoarthritis of knee: Secondary | ICD-10-CM

## 2022-09-23 DIAGNOSIS — Z8639 Personal history of other endocrine, nutritional and metabolic disease: Secondary | ICD-10-CM

## 2022-09-23 DIAGNOSIS — M3501 Sicca syndrome with keratoconjunctivitis: Secondary | ICD-10-CM | POA: Diagnosis not present

## 2022-09-23 DIAGNOSIS — M19041 Primary osteoarthritis, right hand: Secondary | ICD-10-CM

## 2022-09-23 DIAGNOSIS — M2241 Chondromalacia patellae, right knee: Secondary | ICD-10-CM

## 2022-09-23 DIAGNOSIS — M25552 Pain in left hip: Secondary | ICD-10-CM

## 2022-09-23 DIAGNOSIS — R5383 Other fatigue: Secondary | ICD-10-CM

## 2022-09-23 DIAGNOSIS — M25462 Effusion, left knee: Secondary | ICD-10-CM | POA: Diagnosis not present

## 2022-09-23 DIAGNOSIS — M25551 Pain in right hip: Secondary | ICD-10-CM | POA: Diagnosis not present

## 2022-09-23 DIAGNOSIS — D702 Other drug-induced agranulocytosis: Secondary | ICD-10-CM | POA: Diagnosis not present

## 2022-09-23 DIAGNOSIS — R9431 Abnormal electrocardiogram [ECG] [EKG]: Secondary | ICD-10-CM

## 2022-09-23 DIAGNOSIS — M19042 Primary osteoarthritis, left hand: Secondary | ICD-10-CM

## 2022-09-23 DIAGNOSIS — M797 Fibromyalgia: Secondary | ICD-10-CM

## 2022-09-23 MED ORDER — COLCHICINE 0.6 MG PO CAPS: 0.6000 mg | ORAL_CAPSULE | Freq: Every day | ORAL | 0 refills | Status: DC

## 2022-09-23 NOTE — Patient Instructions (Signed)
Plantar Fasciitis Rehab Ask your health care provider which exercises are safe for you. Do exercises exactly as told by your health care provider and adjust them as directed. It is normal to feel mild stretching, pulling, tightness, or discomfort as you do these exercises. Stop right away if you feel sudden pain or your pain gets worse. Do not begin these exercises until told by your health care provider. Stretching and range-of-motion exercises These exercises warm up your muscles and joints and improve the movement and flexibility of your foot. These exercises also help to relieve pain. Plantar fascia stretch  Sit with your left / right leg crossed over your opposite knee. Hold your heel with one hand with that thumb near your arch. With your other hand, hold your toes and gently pull them back toward the top of your foot. You should feel a stretch on the base (bottom) of your toes, or the bottom of your foot (plantar fascia), or both. Hold this stretch for__________ seconds. Slowly release your toes and return to the starting position. Repeat __________ times. Complete this exercise __________ times a day. Gastrocnemius stretch, standing This exercise is also called a calf (gastroc) stretch. It stretches the muscles in the back of the upper calf. Stand with your hands against a wall. Extend your left / right leg behind you, and bend your front knee slightly. Keeping your heels on the floor, your toes facing forward, and your back knee straight, shift your weight toward the wall. Do not arch your back. You should feel a gentle stretch in your upper calf. Hold this position for __________ seconds. Repeat __________ times. Complete this exercise __________ times a day. Soleus stretch, standing This exercise is also called a calf (soleus) stretch. It stretches the muscles in the back of the lower calf. Stand with your hands against a wall. Extend your left / right leg behind you, and bend your  front knee slightly. Keeping your heels on the floor and your toes facing forward, bend your back knee and shift your weight slightly over your back leg. You should feel a gentle stretch deep in your lower calf. Hold this position for __________ seconds. Repeat __________ times. Complete this exercise __________ times a day. Gastroc and soleus stretch, standing step This exercise stretches the muscles in the back of the lower leg. These muscles are in the upper calf (gastrocnemius) and the lower calf (soleus). Stand with the ball of your left / right foot on the front of a step. The ball of your foot is on the walking surface, right under your toes. Keep your other foot firmly on the same step. Hold on to the wall or a railing for balance. Slowly lift your other foot, allowing your body weight to press your heel down over the edge of the front of the step. Keep knee straight and unbent. You should feel a stretch in your calf. Hold this position for __________ seconds. Return both feet to the step. Repeat this exercise with a slight bend in your left / right knee. Repeat __________ times with your left / right knee straight and __________ times with your left / right knee bent. Complete this exercise __________ times a day. Balance exercise This exercise builds your balance and strength control of your arch to help take pressure off your plantar fascia. Single leg stand If this exercise is too easy, you can try it with your eyes closed or while standing on a pillow. Without shoes, stand near a   railing or in a doorway. You may hold on to the railing or door frame as needed. Stand on your left / right foot. Keep your big toe down on the floor and lift the arch of your foot. You should feel a stretch across the bottom of your foot and your arch. Do not let your foot roll inward. Hold this position for __________ seconds. Repeat __________ times. Complete this exercise __________ times a day. This  information is not intended to replace advice given to you by your health care provider. Make sure you discuss any questions you have with your health care provider. Document Revised: 12/07/2019 Document Reviewed: 12/07/2019 Elsevier Patient Education  2024 [employer].

## 2022-09-24 NOTE — Progress Notes (Signed)
Complements WNL

## 2022-09-24 NOTE — Progress Notes (Signed)
CMP WNL  RF is borderline positive.   ESR is borderline elevated-36-stable.  No proteinuria.   WBC count is low-2.6--stable.   Absolute lymphocytes are low-416-stable.  Lymphopenia has improved slightly since reducing the dose of imuran.   We will continue to monitor lab work closely.

## 2022-09-25 LAB — RHEUMATOID FACTOR: Rheumatoid fact SerPl-aCnc: 15 IU/mL — ABNORMAL HIGH (ref <14)

## 2022-09-25 LAB — CBC WITH DIFFERENTIAL/PLATELET
Absolute Monocytes: 567 cells/uL (ref 200–950)
Basophils Absolute: 10 cells/uL (ref 0–200)
Eosinophils Relative: 1.9 %
MCHC: 33.4 g/dL (ref 32.0–36.0)
MCV: 88.3 fL (ref 80.0–100.0)
MPV: 10.8 fL (ref 7.5–12.5)
Neutro Abs: 1557 cells/uL (ref 1500–7800)
Neutrophils Relative %: 59.9 %
RBC: 4.2 10*6/uL (ref 3.80–5.10)
WBC: 2.6 10*3/uL — ABNORMAL LOW (ref 3.8–10.8)

## 2022-09-25 LAB — COMPLETE METABOLIC PANEL WITH GFR
ALT: 21 U/L (ref 6–29)
Calcium: 9.6 mg/dL (ref 8.6–10.4)
Creat: 0.72 mg/dL (ref 0.50–1.05)
Glucose, Bld: 85 mg/dL (ref 65–99)
Sodium: 138 mmol/L (ref 135–146)
Total Protein: 7.8 g/dL (ref 6.1–8.1)
eGFR: 94 mL/min/{1.73_m2} (ref >=60)

## 2022-09-25 LAB — PROTEIN ELECTROPHORESIS, SERUM, WITH REFLEX
Alpha 1: 0.3 g/dL (ref 0.2–0.3)
Alpha 2: 0.8 g/dL (ref 0.5–0.9)
Gamma Globulin: 1.6 g/dL (ref 0.8–1.7)
Total Protein: 7.9 g/dL (ref 6.1–8.1)

## 2022-09-25 LAB — PROTEIN / CREATININE RATIO, URINE: Protein/Creatinine Ratio: 0.133 mg/mg creat (ref 0.024–0.184)

## 2022-09-25 LAB — C3 AND C4: C3 Complement: 154 mg/dL (ref 83–193)

## 2022-09-25 LAB — ANA: Anti Nuclear Antibody (ANA): POSITIVE — AB

## 2022-09-25 LAB — ANTI-NUCLEAR AB-TITER (ANA TITER)

## 2022-09-30 LAB — PROTEIN / CREATININE RATIO, URINE
Creatinine, Urine: 30 mg/dL (ref 20–275)
Protein/Creat Ratio: 133 mg/g creat (ref 24–184)
Total Protein, Urine: 4 mg/dL — ABNORMAL LOW (ref 5–24)

## 2022-09-30 LAB — CBC WITH DIFFERENTIAL/PLATELET
Basophils Relative: 0.4 %
Eosinophils Absolute: 49 cells/uL (ref 15–500)
HCT: 37.1 % (ref 35.0–45.0)
Hemoglobin: 12.4 g/dL (ref 11.7–15.5)
Lymphs Abs: 416 cells/uL — ABNORMAL LOW (ref 850–3900)
MCH: 29.5 pg (ref 27.0–33.0)
Monocytes Relative: 21.8 %
Platelets: 236 10*3/uL (ref 140–400)
RDW: 13.9 % (ref 11.0–15.0)
Total Lymphocyte: 16 %

## 2022-09-30 LAB — COMPLETE METABOLIC PANEL WITH GFR
AG Ratio: 1.3 (calc) (ref 1.0–2.5)
AST: 30 U/L (ref 10–35)
Albumin: 4.4 g/dL (ref 3.6–5.1)
Alkaline phosphatase (APISO): 139 U/L (ref 37–153)
BUN: 18 mg/dL (ref 7–25)
CO2: 31 mmol/L (ref 20–32)
Chloride: 101 mmol/L (ref 98–110)
Globulin: 3.4 g/dL (calc) (ref 1.9–3.7)
Potassium: 4.4 mmol/L (ref 3.5–5.3)
Total Bilirubin: 0.6 mg/dL (ref 0.2–1.2)

## 2022-09-30 LAB — SEDIMENTATION RATE: Sed Rate: 36 mm/h — ABNORMAL HIGH (ref 0–30)

## 2022-09-30 LAB — IFE INTERPRETATION

## 2022-09-30 LAB — PROTEIN ELECTROPHORESIS, SERUM, WITH REFLEX
Albumin ELP: 4.4 g/dL (ref 3.8–4.8)
Beta 2: 0.4 g/dL (ref 0.2–0.5)
Beta Globulin: 0.5 g/dL (ref 0.4–0.6)

## 2022-09-30 LAB — C3 AND C4: C4 Complement: 27 mg/dL (ref 15–57)

## 2022-09-30 LAB — ANTI-NUCLEAR AB-TITER (ANA TITER): ANA Titer 1: 1:320 {titer} — ABNORMAL HIGH

## 2022-09-30 LAB — ANTI-DNA ANTIBODY, DOUBLE-STRANDED: ds DNA Ab: <1 IU/mL

## 2022-09-30 NOTE — Progress Notes (Signed)
dsDNA negative.  ANA remains positive.   Abnormal SPEP/IFE.  Please notify the patient and place referral to hematology for further evaluation

## 2022-10-01 ENCOUNTER — Telehealth: Payer: Self-pay | Admitting: *Deleted

## 2022-10-01 ENCOUNTER — Other Ambulatory Visit: Payer: Self-pay | Admitting: Internal Medicine

## 2022-10-01 DIAGNOSIS — Z1231 Encounter for screening mammogram for malignant neoplasm of breast: Secondary | ICD-10-CM

## 2022-10-01 DIAGNOSIS — R778 Other specified abnormalities of plasma proteins: Secondary | ICD-10-CM

## 2022-10-01 NOTE — Telephone Encounter (Signed)
-----   Message from Gearldine Bienenstock sent at 09/30/2022  6:00 PM EDT ----- dsDNA negative.  ANA remains positive.   Abnormal SPEP/IFE.  Please notify the patient and place referral to hematology for further evaluation

## 2022-10-12 ENCOUNTER — Telehealth: Payer: Self-pay | Admitting: Rheumatology

## 2022-10-12 NOTE — Telephone Encounter (Signed)
Pt called requesting her lab results be sent to her PCP Dr. Timothy Lasso.

## 2022-10-13 NOTE — Telephone Encounter (Signed)
Lab results sent to PCP as requested.

## 2022-11-03 ENCOUNTER — Other Ambulatory Visit: Payer: Self-pay | Admitting: Physician Assistant

## 2022-11-05 ENCOUNTER — Ambulatory Visit: Payer: 59

## 2022-11-07 ENCOUNTER — Other Ambulatory Visit: Payer: Self-pay | Admitting: Internal Medicine

## 2022-11-07 ENCOUNTER — Inpatient Hospital Stay: Payer: 59

## 2022-11-07 ENCOUNTER — Encounter: Payer: Self-pay | Admitting: Internal Medicine

## 2022-11-07 ENCOUNTER — Inpatient Hospital Stay: Payer: 59 | Attending: Internal Medicine | Admitting: Internal Medicine

## 2022-11-07 VITALS — BP 125/63 | HR 81 | Temp 97.3°F | Resp 16 | Wt 145.8 lb

## 2022-11-07 DIAGNOSIS — Z79899 Other long term (current) drug therapy: Secondary | ICD-10-CM | POA: Diagnosis not present

## 2022-11-07 DIAGNOSIS — D72819 Decreased white blood cell count, unspecified: Secondary | ICD-10-CM | POA: Diagnosis not present

## 2022-11-07 DIAGNOSIS — Z79620 Long term (current) use of immunosuppressive biologic: Secondary | ICD-10-CM | POA: Diagnosis not present

## 2022-11-07 DIAGNOSIS — Z8616 Personal history of COVID-19: Secondary | ICD-10-CM | POA: Diagnosis not present

## 2022-11-07 DIAGNOSIS — M329 Systemic lupus erythematosus, unspecified: Secondary | ICD-10-CM | POA: Diagnosis not present

## 2022-11-07 DIAGNOSIS — M069 Rheumatoid arthritis, unspecified: Secondary | ICD-10-CM | POA: Diagnosis not present

## 2022-11-07 DIAGNOSIS — Z809 Family history of malignant neoplasm, unspecified: Secondary | ICD-10-CM | POA: Diagnosis not present

## 2022-11-07 DIAGNOSIS — M3500 Sicca syndrome, unspecified: Secondary | ICD-10-CM | POA: Diagnosis not present

## 2022-11-07 LAB — CBC WITH DIFFERENTIAL (CANCER CENTER ONLY)
Abs Immature Granulocytes: 0.01 10*3/uL (ref 0.00–0.07)
Basophils Absolute: 0 10*3/uL (ref 0.0–0.1)
Basophils Relative: 1 %
Eosinophils Absolute: 0.1 10*3/uL (ref 0.0–0.5)
Eosinophils Relative: 2 %
HCT: 38.5 % (ref 36.0–46.0)
Hemoglobin: 13 g/dL (ref 12.0–15.0)
Immature Granulocytes: 0 %
Lymphocytes Relative: 14 %
Lymphs Abs: 0.4 10*3/uL — ABNORMAL LOW (ref 0.7–4.0)
MCH: 30 pg (ref 26.0–34.0)
MCHC: 33.8 g/dL (ref 30.0–36.0)
MCV: 88.9 fL (ref 80.0–100.0)
Monocytes Absolute: 0.4 10*3/uL (ref 0.1–1.0)
Monocytes Relative: 16 %
Neutro Abs: 1.7 10*3/uL (ref 1.7–7.7)
Neutrophils Relative %: 67 %
Platelet Count: 215 10*3/uL (ref 150–400)
RBC: 4.33 MIL/uL (ref 3.87–5.11)
RDW: 13.2 % (ref 11.5–15.5)
WBC Count: 2.5 10*3/uL — ABNORMAL LOW (ref 4.0–10.5)
nRBC: 0 % (ref 0.0–0.2)

## 2022-11-07 LAB — IRON AND IRON BINDING CAPACITY (CC-WL,HP ONLY)
Iron: 96 ug/dL (ref 28–170)
Saturation Ratios: 26 % (ref 10.4–31.8)
TIBC: 374 ug/dL (ref 250–450)
UIBC: 278 ug/dL (ref 148–442)

## 2022-11-07 LAB — CMP (CANCER CENTER ONLY)
ALT: 18 U/L (ref 0–44)
AST: 27 U/L (ref 15–41)
Albumin: 4.5 g/dL (ref 3.5–5.0)
Alkaline Phosphatase: 132 U/L — ABNORMAL HIGH (ref 38–126)
Anion gap: 7 (ref 5–15)
BUN: 19 mg/dL (ref 8–23)
CO2: 28 mmol/L (ref 22–32)
Calcium: 9.7 mg/dL (ref 8.9–10.3)
Chloride: 103 mmol/L (ref 98–111)
Creatinine: 0.77 mg/dL (ref 0.44–1.00)
GFR, Estimated: >60 mL/min (ref >60)
Glucose, Bld: 80 mg/dL (ref 70–99)
Potassium: 3.8 mmol/L (ref 3.5–5.1)
Sodium: 138 mmol/L (ref 135–145)
Total Bilirubin: 0.7 mg/dL (ref 0.3–1.2)
Total Protein: 8.2 g/dL — ABNORMAL HIGH (ref 6.5–8.1)

## 2022-11-07 LAB — FERRITIN: Ferritin: 31 ng/mL (ref 11–307)

## 2022-11-07 LAB — TSH: TSH: <0.01 u[IU]/mL — ABNORMAL LOW (ref 0.350–4.500)

## 2022-11-07 LAB — LACTATE DEHYDROGENASE: LDH: 174 U/L (ref 98–192)

## 2022-11-07 LAB — HEPATITIS PANEL, ACUTE
HCV Ab: NONREACTIVE
Hep A IgM: NONREACTIVE
Hep B C IgM: NONREACTIVE
Hepatitis B Surface Ag: NONREACTIVE

## 2022-11-07 LAB — FOLATE: Folate: 11.8 ng/mL (ref >5.9)

## 2022-11-07 LAB — VITAMIN B12: Vitamin B-12: 252 pg/mL (ref 180–914)

## 2022-11-07 NOTE — Progress Notes (Signed)
[facility] [phone]   [phone]  CONSULT NOTE  REFERRING PHYSICIAN: Dr. Pollyann Savoy  REASON FOR CONSULTATION:  62 years old white female with leukocytopenia.  HPI Stefanie [name] is a 62 y.o. 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  The patient was seen by her primary care provider for routine evaluation and management of her medical conditions.  She had repeat CBC on September 23, 2022 that showed total white blood count of 2.6.  She had normal hemoglobin of 12.4 and hematocrit 37.1 with normal platelets count of 236,000.  Her absolute lymphocyte count was decreased at 416.  She had similar results on previous blood work dating back to October 04, 2020.  She had a rheumatologic evaluation in the past and she always had elevated rheumatoid factor.  She also has positive ANA.  As detected on previous blood work back to 14 years ago.  I have seen the patient many years ago for anemia which is completely resolved. She was referred to me today for evaluation and recommendation regarding her leukocytopenia.  She had frequent infection recently especially with sinus infection and COVID 3 times in addition to flu symptoms and strep throat in few months.  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  She is planning to retire in January 2025. When seen today she is feeling fine except for easy fatigue.  She also has occasional chest pain secondary to mitral valve prolapse and she is followed by cardiology.  She denied having any current cough but has shortness of breath with exertion and no hemoptysis.  She has no nausea, vomiting, diarrhea or constipation.  She has no headache or visual changes.  She has no recent  weight loss or night sweats. Family history significant for father who died from stroke and heart disease at an early age.  Mother had thyroiditis and dementia and she died after a fall.  She had a sister with renal and pancreatic transplant. The patient is married and has 2 children a son and daughter.  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 She has no history of smoking, alcohol or drug abuse.  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   Mitral valve prolapse    pt had echo on 09/04/21   PONV (postoperative nausea and vomiting)    PVC's (premature ventricular contractions)    SLE (systemic lupus erythematosus) (HCC) 12/06/2012   Thyroid disease     Past Surgical History:  Procedure Laterality Date   COLONOSCOPY     KNEE ARTHROSCOPY WITH MENISCAL REPAIR Left 12/12/2021   Procedure: LEFT KNEE ARTHROSCOPIC DEBRIDEMENT, LATERAL MENISCECTOMY;  Surgeon: Tarry Kos, MD;  Location: [facility] OR;  Service: Orthopedics;  Laterality: Left;   PITUITARY EXCISION  1993   TONSILLECTOMY      Family History  Problem Relation Age of Onset   Atrial fibrillation Mother    Dementia Mother  Diabetes Father    Heart disease Father    Hyperlipidemia Father    Hypertension Father    Stroke Father    Diabetes Sister    Kidney disease Sister        transplant   Heart disease Sister        open heart surgery    Pancreatic disease Sister        transplant    Cancer Maternal Grandmother    Diabetes Paternal Grandfather    Heart disease Paternal Grandfather    Hyperlipidemia Paternal Grandfather    Hypertension Paternal Actor    Healthy Daughter    Healthy Son    Colon cancer Neg Hx     Esophageal cancer Neg Hx    Rectal cancer Neg Hx    Stomach cancer Neg Hx     Social History Social History   Tobacco Use   Smoking status: Never    Passive exposure: Never   Smokeless tobacco: Never  Vaping Use   Vaping status: Never Used  Substance Use Topics   Alcohol use: No   Drug use: No    Allergies  Allergen Reactions   Iron Sucrose Rash    Chest pain   Morphine Nausea And Vomiting   Venofer [Ferric Oxide] Itching, Palpitations and Rash    Dizziness, sweating, "felt like I was going to pass out" unknown when episode happened, itching   Azithromycin Other (See Comments)    "couldnt sleep"   Other     Other reaction(s): Other (See Comments) Magic mouth wash. Caused tongue to have a burning feeling    Current Outpatient Medications  Medication Sig Dispense Refill   acetaminophen (TYLENOL) 500 MG tablet Take 1,000 mg by mouth every 6 (six) hours as needed for moderate pain.     albuterol (PROVENTIL HFA;VENTOLIN HFA) 108 (90 BASE) MCG/ACT inhaler Inhale 1 puff into the lungs every 6 (six) hours as needed for wheezing or shortness of breath.     azaTHIOprine (IMURAN) 50 MG tablet Take 50 mg 1 tablet p.o. daily. 90 tablet 0   Colchicine (MITIGARE) 0.6 MG CAPS Take 1 capsule (0.6 mg total) by mouth daily. 90 capsule 0   HYDROcodone-acetaminophen (NORCO) 5-325 MG tablet Take 1 tablet by mouth 3 (three) times daily as needed. To be taken after surgery (Patient taking differently: Take 1 tablet by mouth as needed. To be taken after surgery) 20 tablet 0   ibuprofen (ADVIL) 200 MG tablet Take 200 mg by mouth every 6 (six) hours as needed.     levothyroxine (SYNTHROID) 112 MCG tablet Take 112 mcg by mouth daily before breakfast.     levothyroxine (SYNTHROID, LEVOTHROID) 125 MCG tablet Take 125 mcg by mouth daily before breakfast. (Patient not taking: Reported on 09/23/2022)     loratadine (CLARITIN) 10 MG tablet Take 10 mg by mouth daily.     Polyvinyl Alcohol-Povidone (REFRESH  OP) Place 1 drop into both eyes as needed (dry eyes).     No current facility-administered medications for this visit.    Review of Systems  Constitutional: positive for fatigue Eyes: negative Ears, nose, mouth, throat, and face: negative Respiratory: positive for dyspnea on exertion and pleurisy/chest pain Cardiovascular: negative Gastrointestinal: negative Genitourinary:negative Integument/breast: negative Hematologic/lymphatic: negative Musculoskeletal:positive for arthralgias and myalgias Neurological: negative Behavioral/Psych: negative Endocrine: negative Allergic/Immunologic: negative  Physical Exam  KVQ:[id number], healthy, no distress, well nourished, and well developed SKIN: skin color, texture, turgor are normal, no rashes or significant lesions HEAD: Normocephalic, No  masses, lesions, tenderness or abnormalities EYES: normal, PERRLA, Conjunctiva are pink and non-injected EARS: External ears normal, Canals clear OROPHARYNX:no exudate, no erythema, and lips, buccal mucosa, and tongue normal  NECK: supple, no adenopathy, no JVD LYMPH:  no palpable lymphadenopathy, no hepatosplenomegaly BREAST:not examined LUNGS: clear to auscultation , and palpation HEART: regular rate & rhythm, no murmurs, and no gallops ABDOMEN:abdomen soft, non-tender, normal bowel sounds, and no masses or organomegaly BACK: Back symmetric, no curvature., No CVA tenderness EXTREMITIES:no joint deformities, effusion, or inflammation, no edema  NEURO: alert & oriented x 3 with fluent speech, no focal motor/sensory deficits  PERFORMANCE STATUS: ECOG 1  LABORATORY DATA: Lab Results  Component Value Date   WBC 2.6 (L) 09/23/2022   HGB 12.4 09/23/2022   HCT 37.1 09/23/2022   MCV 88.3 09/23/2022   PLT 236 09/23/2022      Chemistry      Component Value Date/Time   NA 138 09/23/2022 1606   NA 138 04/24/2020 0946   K 4.4 09/23/2022 1606   CL 101 09/23/2022 1606   CO2 31 09/23/2022 1606   BUN  18 09/23/2022 1606   BUN 13 04/24/2020 0946   CREATININE 0.72 09/23/2022 1606      Component Value Date/Time   CALCIUM 9.6 09/23/2022 1606   ALKPHOS 82 05/15/2020 1737   AST 30 09/23/2022 1606   ALT 21 09/23/2022 1606   BILITOT 0.6 09/23/2022 1606       RADIOGRAPHIC STUDIES: No results found.  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   PLAN: I had a lengthy discussion with the patient today about her current condition and further investigation to identify any underlying etiology. I ordered several studies today including repeat CBC that showed total white blood count of 2.5 with absolute neutrophil count of 1700 and low absolute lymphocyte count of 400.  She has normal hemoglobin and hematocrit as well as platelet count.  Comprehensive metabolic panel is unremarkable except for slightly elevated total protein of 8.2 and alkaline phosphatase of 132. She has normal iron study, ferritin, serum folate, LDH and vitamin B12. Other lab results are still pending including ANA, rheumatoid factor, HIV antibody, acute hepatitis panel as well as quantitative immunoglobulin. The patient has history of recurrent upper respiratory infection recently but she works in childcare facility with [employer] and exposure to viral infection from the children. 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  Other option would be consideration of injection with granulocyte stimulating factor if she is prone to more frequent infection.  The patient mentions that she is retiring from her current job in January 2025 and she will have less exposure to viral  infection from the children in the childcare. Her absolute neutrophil count still within the normal range and the patient would not need any urgent treatment with growth factor at this point.  She definitely will need coverage with antibiotics if she has any infection. I will see her back for follow-up visit in 3 months for evaluation and repeat blood work. She was advised to call immediately if she has any other concerning symptoms in the interval.  The patient voices understanding of current disease status and treatment options and  is in agreement with the current care plan.  All questions were answered. The patient knows to call the clinic with any problems, questions or concerns. We can certainly see the patient much sooner if necessary.  Thank you so much for allowing me to participate in the care of Stefanie [name]. I will continue to follow up the patient with you and assist in her care. The total time spent in the appointment was 60 minutes.  Disclaimer: This note was dictated with voice recognition software. Similar sounding words can inadvertently be transcribed and may not be corrected upon review.   Lajuana Matte November 07, 2022, 8:19 AM

## 2022-11-08 LAB — HIV ANTIBODY (ROUTINE TESTING W REFLEX): HIV Screen 4th Generation wRfx: NONREACTIVE

## 2022-11-10 LAB — IGG, IGA, IGM
IgA: 322 mg/dL (ref 87–352)
IgG (Immunoglobin G), Serum: 1620 mg/dL — ABNORMAL HIGH (ref 586–1602)
IgM (Immunoglobulin M), Srm: 141 mg/dL (ref 26–217)

## 2022-11-10 LAB — RHEUMATOID FACTOR: Rheumatoid fact SerPl-aCnc: 16.4 [IU]/mL — ABNORMAL HIGH (ref <14.0)

## 2022-11-12 LAB — FANA STAINING PATTERNS: Speckled Pattern: 24529 — ABNORMAL HIGH

## 2022-11-12 LAB — ANTINUCLEAR ANTIBODIES, IFA: ANA Ab, IFA: POSITIVE — AB

## 2022-12-23 ENCOUNTER — Encounter: Payer: Self-pay | Admitting: Pulmonary Disease

## 2022-12-23 ENCOUNTER — Other Ambulatory Visit: Payer: Self-pay | Admitting: Physician Assistant

## 2022-12-24 NOTE — Telephone Encounter (Signed)
Last Fill: 09/09/2022 (Imuran), 09/23/2022 (Colchicine)  Labs: 11/07/2022 Total Protein 8.2, Alk. Phos 132, WBC 2.5, Lymphs Abs 0.4  Next Visit: 02/25/2023  Last Visit: 09/23/2022  DX: Sjogren's syndrome with keratoconjunctivitis sicca Chondrocalcinosis   Current Dose per office note 09/23/2022: muran 50 mg 1 tablet by mouth daily Mitigare 0.6 mg 1 capsule by mouth daily.   Okay to refill Imuran?

## 2022-12-25 ENCOUNTER — Telehealth: Payer: Self-pay

## 2022-12-25 NOTE — Telephone Encounter (Signed)
Patient called asking if she should hold Imuran when receiving the flu vaccine. Patient was advised the flu vaccine is not a live vaccine and she does not have to hold the imuran. Patient verbalized understanding.

## 2022-12-29 ENCOUNTER — Other Ambulatory Visit (INDEPENDENT_AMBULATORY_CARE_PROVIDER_SITE_OTHER): Payer: 59

## 2022-12-29 ENCOUNTER — Ambulatory Visit: Payer: 59 | Admitting: Orthopaedic Surgery

## 2022-12-29 DIAGNOSIS — G8929 Other chronic pain: Secondary | ICD-10-CM

## 2022-12-29 DIAGNOSIS — M25561 Pain in right knee: Secondary | ICD-10-CM

## 2022-12-29 DIAGNOSIS — M1712 Unilateral primary osteoarthritis, left knee: Secondary | ICD-10-CM

## 2022-12-29 NOTE — Progress Notes (Signed)
Office Visit Note   Patient: Stefanie [name]           Date of Birth: Mar 16, 1960           MRN: [id number] Visit Date: 12/29/2022              Requested by: Creola Corn, MD [address] [place],  [place] [address] PCP: Creola Corn, MD   Assessment & Plan: Visit Diagnoses:  1. Primary osteoarthritis of left knee   2. Chronic pain of right knee     Plan: Stefanie [name] is a very pleasant 62 year old female with bilateral knee pain worse on the left.  Left knee has a valgus deformity with bone-on-bone changes.  I think this is probably contributing to her hip pain.  I will give her some IT band exercises and she may follow-up with Dr. Ivar Drape for a cortisone injection.  She will also follow-up with her in December to do knee injections.  She understands that ultimately she is facing a knee replacement.  Follow-Up Instructions: No follow-ups on file.   Orders:  Orders Placed This Encounter  Procedures   XR KNEE 3 VIEW RIGHT   XR KNEE 3 VIEW LEFT   No orders of the defined types were placed in this encounter.     Procedures: No procedures performed   Clinical Data: No additional findings.   Subjective: Chief Complaint  Patient presents with   Right Knee - Pain   Left Knee - Pain    HPI Stefanie [name] is a 62 year old female who is a patient mine who comes in for complaints of bilateral knee popping and left knee locking.  She does feel the knees swell and she limps as a result.  She is feeling some right hip pain as a result.  She underwent left knee scope almost exactly a year ago.  She denies any recent injuries.  She continues to have start up pain and stiffness.  Review of Systems  Constitutional: Negative.   HENT: Negative.    Eyes: Negative.   Respiratory: Negative.    Cardiovascular: Negative.   Endocrine: Negative.   Musculoskeletal: Negative.   Neurological: Negative.   Hematological: Negative.   Psychiatric/Behavioral: Negative.    All other systems  reviewed and are negative.    Objective: Vital Signs: LMP 04/09/2014 (Approximate)   Physical Exam Vitals and nursing note reviewed.  Constitutional:      Appearance: She is well-developed.  HENT:     Head: Normocephalic and atraumatic.  Pulmonary:     Effort: Pulmonary effort is normal.  Abdominal:     Palpations: Abdomen is soft.  Musculoskeletal:     Cervical back: Neck supple.  Skin:    General: Skin is warm.     Capillary Refill: Capillary refill takes less than 2 seconds.  Neurological:     Mental Status: She is alert and oriented to person, place, and time.  Psychiatric:        Behavior: Behavior normal.        Thought Content: Thought content normal.        Judgment: Judgment normal.     Ortho Exam Exam of the left knee shows valgus alignment.  Lateral joint line tenderness.  No significant changes compared to prior visit.  Exam of the right knee shows no changes compared to prior visit. Specialty Comments:  No specialty comments available.  Imaging: XR KNEE 3 VIEW RIGHT  Result Date: 12/29/2022 X-rays of the right knee show moderate tricompartmental  osteoarthritis with mild joint space narrowing.  No malalignments.  XR KNEE 3 VIEW LEFT  Result Date: 12/29/2022 X-rays of the knee shows advanced degenerative changes especially in the lateral compartment with bone-on-bone joint space narrowing and periarticular spurring.  Mild valgus alignment.    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    Prolonged QT interval 07/28/2016   Primary osteoarthritis of both hands 05/11/2016   Primary osteoarthritis of both knees 05/11/2016   History of hypothyroidism 05/11/2016   History of migraine 05/11/2016   Autoimmune disease (HCC) positive ANA, 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, 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, anticardiolipin antibody and positive rheumatoid factor 04/29/2016   ANA positive 04/29/2016   Rheumatoid factor positive 04/29/2016   Pseudogout 04/29/2016   Chondrocalcinosis 04/29/2016   High risk medication use 04/29/2016   Sjogren's syndrome with keratoconjunctivitis sicca (HCC) 04/29/2016   History of neutropenia 04/29/2016   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   Hyponatremia 12/12/2013   Fever 12/13/2012   Pleuritic chest pain 12/06/2012   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   Anxiety 12/06/2012   ANEMIA-IRON DEFICIENCY 01/08/2010   GASTRIC DILATION, ACUTE 01/08/2010   NAUSEA ALONE 01/08/2010   ABDOMINAL PAIN, LEFT UPPER QUADRANT 01/08/2010   Past Medical History:  Diagnosis Date   Anemia    Arthritis    Asthma    DDD (degenerative disc disease), lumbar    Diabetes insipidus (HCC)    Fibromyalgia    Headache    hx of migraines   Hypothyroidism    Lupus (HCC)    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   Mitral valve prolapse    pt had echo on 09/04/21   PONV (postoperative nausea and vomiting)    PVC's (premature ventricular contractions)    SLE (systemic lupus erythematosus) (HCC) 12/06/2012   Thyroid disease     Family History  Problem Relation Age of Onset   Atrial fibrillation Mother    Dementia Mother    Diabetes Father    Heart disease Father    Hyperlipidemia Father    Hypertension Father    Stroke Father    Diabetes Sister    Kidney disease Sister        transplant   Heart disease Sister        open heart surgery    Pancreatic disease Sister        transplant    Cancer Maternal Grandmother    Diabetes Paternal Grandfather     Heart disease Paternal Grandfather    Hyperlipidemia Paternal Grandfather    Hypertension Paternal Actor    Healthy Daughter    Healthy Son    Colon cancer Neg Hx    Esophageal cancer Neg Hx    Rectal cancer Neg Hx    Stomach cancer Neg Hx     Past Surgical History:  Procedure Laterality Date   COLONOSCOPY     KNEE ARTHROSCOPY WITH MENISCAL REPAIR Left 12/12/2021   Procedure: LEFT KNEE ARTHROSCOPIC DEBRIDEMENT, LATERAL MENISCECTOMY;  Surgeon: Tarry Kos, MD;  Location: [facility] OR;  Service:  Orthopedics;  Laterality: Left;   PITUITARY EXCISION  1993   TONSILLECTOMY     Social History   Occupational History   Not on file  Tobacco Use   Smoking status: Never    Passive exposure: Never   Smokeless tobacco: Never  Vaping Use   Vaping status: Never Used  Substance and Sexual Activity   Alcohol use: No   Drug use: No   Sexual activity: Not on file

## 2023-01-11 NOTE — Progress Notes (Unsigned)
Office Visit Note  Patient: Stefanie [name]             Date of Birth: Sep 03, 1960           MRN: [id number]             PCP: Creola Corn, MD Referring: Creola Corn, MD Visit Date: 01/13/2023 Occupation: @GUAROCC @  Subjective:  Pain in both knees  History of Present Illness: Stefanie [name] is a 62 y.o. female with Sjogren's, osteoarthritis and fibromyalgia syndrome.  She states that she has been having increased pain and discomfort in her hips and knee joints.  She was seen recently by Dr. Deno Etienne who advised her that she had trochanteric bursitis and osteoarthritis in her knee joints.  He recommended cortisone injection in her left knee.  Patient states she is about to go on a trip and would like to have her left knee joint cortisone injection.  She has been trying to do some stretching exercises.  She continues to have some generalized pain and discomfort from fibromyalgia.  She complains of dry mouth and dry eye symptoms.  She has not noticed much inflammatory arthritis except for her left knee.  She has been on Imuran 50 mg p.o. daily which she has been taking without any interruption since the last visit.  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  She continues to have some stiffness in her hands.  She had no recurrence of plantar fasciitis.    Activities of Daily Living:  Patient reports morning stiffness for several hours.   Patient Reports nocturnal pain.  Difficulty dressing/grooming: Reports Difficulty climbing stairs: Reports Difficulty getting out of chair: Reports Difficulty using hands for taps, buttons, cutlery, and/or writing: Reports  Review of Systems  Constitutional:  Positive for fatigue.  HENT:  Positive for mouth dryness. Negative for mouth sores.   Eyes:  Positive for photophobia and dryness.  Respiratory:  Positive for wheezing. Negative for cough and shortness of breath.   Cardiovascular:  Positive for chest  pain. Negative for palpitations.  Gastrointestinal:  Negative for blood in stool, constipation and diarrhea.  Endocrine: Negative for increased urination.  Genitourinary:  Positive for involuntary urination.  Musculoskeletal:  Positive for joint pain, gait problem, joint pain, joint swelling, myalgias, muscle weakness, morning stiffness, muscle tenderness and myalgias.  Skin:  Positive for color change. Negative for rash, hair loss and sensitivity to sunlight.  Allergic/Immunologic: Positive for susceptible to infections.  Neurological:  Positive for headaches. Negative for dizziness.  Hematological:  Negative for swollen glands.  Psychiatric/Behavioral:  Positive for sleep disturbance. Negative for depressed mood. The patient is not nervous/anxious.     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   Prolonged QT interval 07/28/2016   Primary osteoarthritis of both hands 05/11/2016   Primary osteoarthritis of both knees 05/11/2016   History of hypothyroidism 05/11/2016   History of migraine 05/11/2016   Autoimmune disease (HCC) positive ANA, 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, 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, anticardiolipin antibody and positive rheumatoid factor 04/29/2016  ANA positive 04/29/2016   Rheumatoid factor positive 04/29/2016   Pseudogout 04/29/2016   Chondrocalcinosis 04/29/2016   High risk medication use 04/29/2016   Sjogren's syndrome with keratoconjunctivitis sicca (HCC) 04/29/2016   History of neutropenia 04/29/2016   History of diabetes insipidus  04/29/2016   Hyponatremia 12/12/2013   Fever 12/13/2012   Pleuritic chest pain 12/06/2012   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   Anxiety 12/06/2012   ANEMIA-IRON DEFICIENCY 01/08/2010   GASTRIC DILATION, ACUTE 01/08/2010   NAUSEA ALONE 01/08/2010   ABDOMINAL PAIN, LEFT UPPER QUADRANT 01/08/2010    Past Medical History:  Diagnosis Date   Anemia    Arthritis    Asthma    DDD (degenerative disc disease), lumbar    Diabetes insipidus (HCC)    Fibromyalgia    Headache    hx of migraines   Hypothyroidism    Lupus    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   Mitral valve prolapse    pt had echo on 09/04/21   PONV (postoperative nausea and vomiting)    PVC's (premature ventricular contractions)    SLE (systemic lupus erythematosus) (HCC) 12/06/2012   Thyroid disease     Family History  Problem Relation Age of Onset   Atrial fibrillation Mother    Dementia Mother    Diabetes Father    Heart disease Father    Hyperlipidemia Father    Hypertension Father    Stroke Father    Diabetes Sister    Kidney disease Sister        transplant   Heart disease Sister        open heart surgery    Pancreatic disease Sister        transplant    Cancer Maternal Grandmother    Diabetes Paternal Grandfather    Heart disease Paternal Grandfather    Hyperlipidemia Paternal Grandfather    Hypertension Paternal Actor    Healthy Daughter    Healthy Son    Colon cancer Neg Hx    Esophageal cancer Neg Hx    Rectal cancer Neg Hx    Stomach cancer Neg Hx    Past Surgical History:  Procedure Laterality Date   COLONOSCOPY     KNEE ARTHROSCOPY WITH MENISCAL REPAIR Left 12/12/2021   Procedure: LEFT KNEE ARTHROSCOPIC DEBRIDEMENT, LATERAL MENISCECTOMY;  Surgeon: Tarry Kos, MD;  Location: [facility] OR;  Service: Orthopedics;  Laterality: Left;   PITUITARY EXCISION  1993   TONSILLECTOMY     Social History   Social History Narrative   Right handed    Immunization History  Administered Date(s) Administered   Influenza Inj Mdck Quad Pf 01/21/2020   Influenza-Unspecified 09/28/2018   PFIZER(Purple Top)SARS-COV-2 Vaccination 11/17/2019, 12/13/2019     Objective: Vital Signs: BP 120/73 (BP Location: Left Arm, Patient Position: Sitting, Cuff Size: Normal)   Pulse 73   Resp 13   Ht 5\' 6"  (1.676 m)   Wt 148 lb 12.8 oz (67.5 kg)   LMP 04/09/2014 (Approximate)   BMI 24.02 kg/m    Physical Exam Vitals and nursing note reviewed.  Constitutional:      Appearance: She is well-developed.  HENT:     Head: Normocephalic and atraumatic.  Eyes:     Conjunctiva/sclera: Conjunctivae normal.  Cardiovascular:     Rate and Rhythm: Normal rate and regular rhythm.     Heart sounds: Normal heart sounds.  Pulmonary:     Effort: Pulmonary effort is normal.  Breath sounds: Normal breath sounds.  Abdominal:     General: Bowel sounds are normal.     Palpations: Abdomen is soft.  Musculoskeletal:     Cervical back: Normal range of motion.  Lymphadenopathy:     Cervical: No cervical adenopathy.  Skin:    General: Skin is warm and dry.     Capillary Refill: Capillary refill takes less than 2 seconds.  Neurological:     Mental Status: She is alert and oriented to person, place, and time.  Psychiatric:        Behavior: Behavior normal.      Musculoskeletal Exam: Cervical spine was in good range of motion.  She had bilateral trapezius spasm.  There was no tenderness over thoracic or lumbar spine.  Shoulders, elbows, wrist joints, MCPs PIPs and DIPs with good range of motion.  She had bilateral CMC thickening and subluxation.  PIP and DIP thickening was noted.  Hip joints were in good range of motion.  She had tenderness over bilateral trochanteric bursa.  Knee joints were in good range of motion.  She had warmth and swelling on palpation of her left knee joint.  There was no tenderness over ankles or MTPs.  She had generalized hyperalgesia and  positive tender points.  CDAI Exam: CDAI Score: -- Patient Global: --; Provider Global: -- Swollen: --; Tender: -- Joint Exam 01/13/2023   No joint exam has been documented for this visit   There is currently no information documented on the homunculus. Go to the Rheumatology activity and complete the homunculus joint exam.  Investigation: No additional findings.  Imaging: XR KNEE 3 VIEW RIGHT  Result Date: 12/29/2022 X-rays of the right knee show moderate tricompartmental osteoarthritis with mild joint space narrowing.  No malalignments.  XR KNEE 3 VIEW LEFT  Result Date: 12/29/2022 X-rays of the knee shows advanced degenerative changes especially in the lateral compartment with bone-on-bone joint space narrowing and periarticular spurring.  Mild valgus alignment.   Recent Labs: Lab Results  Component Value Date   WBC 2.5 (L) 11/07/2022   HGB 13.0 11/07/2022   PLT 215 11/07/2022   NA 138 11/07/2022   K 3.8 11/07/2022   CL 103 11/07/2022   CO2 28 11/07/2022   GLUCOSE 80 11/07/2022   BUN 19 11/07/2022   CREATININE 0.77 11/07/2022   BILITOT 0.7 11/07/2022   ALKPHOS 132 (H) 11/07/2022   AST 27 11/07/2022   ALT 18 11/07/2022   PROT 8.2 (H) 11/07/2022   ALBUMIN 4.5 11/07/2022   CALCIUM 9.7 11/07/2022   GFRAA 102 04/24/2020   QFTBGOLDPLUS NEGATIVE 10/16/2019    Speciality Comments: PLQ Eye Exam 02/26/2020 WNL @ [facility] follow up in 1 year  Procedures:  Large Joint Inj: L knee on 01/13/2023 11:57 AM Indications: pain Details: 27 G 1.5 in needle, medial approach  Arthrogram: No  Medications: 40 mg triamcinolone acetonide 40 MG/ML; 1.5 mL lidocaine 1 % Aspirate: 0 mL Outcome: tolerated well, no immediate complications Procedure, treatment alternatives, risks and benefits explained, specific risks discussed. Consent was given by the patient. Immediately prior to procedure a time out was called to verify the correct patient, procedure, equipment,  support staff and site/side marked as required. Patient was prepped and draped in the usual sterile fashion.     Allergies: Iron sucrose, Morphine, Venofer [ferric oxide], Azithromycin, and Other   Assessment / Plan:     Visit Diagnoses: Sjogren's syndrome with keratoconjunctivitis sicca (HCC) - Positive ANA, positive Ro, positive La, positive  RF sicca symptoms, inflammatory arthritis: -She continues to have sicca symptoms with dry mouth and dry eyes.  She has been using over-the-counter products which are helping only to some extent.  Besides her left knee joint none of the other joints are swollen.  Her previous labs were reviewed.  Will check labs again today.  She has positive rheumatoid factor and elevated sedimentation rate.  Plan: Protein / creatinine ratio, urine, Anti-DNA antibody, double-stranded, C3 and C4, Sedimentation rate  High risk medication use - Imuran 50 mg 1 tablet by mouth daily-reduced due to neutropenia. - Plan: CBC with Differential/Platelet, COMPLETE METABOLIC PANEL WITH GFR today and then every 3 months.  Information on immunization was placed in the AVS.  She was advised to hold Imuran if she develops an infection resume after the infection resolves.  Other drug-induced neutropenia (HCC) - Lymphopenia--WBC count 2.4 and absolute lymphocytes 348 on 06/17/22.  Dose of imuran reduced to 50 mg daily.  Lymphoid interstitial pneumonia [facility]) -she is followed by Dr. Isaiah Serge, on 08/04/2022 PFTs had improved and diffusion capacity.  High-resolution CT is pending for November 2024.  She has a follow-up appointment coming up with Dr. Isaiah Serge.  Primary osteoarthritis of both hands-she has been told CMC PIP and DIP thickening.  Joint protection muscle strengthening was discussed.  Chronic pain of both hips-she has been having discomfort in her bilateral hips.  She was recently evaluated by Dr. Roda Shutters.  She was advised that she had bilateral trochanteric bursitis.  Primary osteoarthritis of  both knees-she continues to have pain and discomfort in the bilateral knee joints.  She has had viscosupplement injections and cortisone injections in the past.  She is about to go on a trip.  She requested a left knee cortisone injection.  After side effects were Ro and discussed left knee joint was injected with lidocaine and cortisone as described above.  She tolerated the procedure well.  Postprocedure instructions were given.  Effusion, left knee - MRI of the left knee 09/27/2021:She underwent left knee arthroscopic surgery on 12/12/2021 performed by Dr. Roda Shutters.  Chondromalacia patellae, right knee-chronic pain.  Chondrocalcinosis -she continues to take Mitigare 0.6 mg 1 capsule by mouth daily.  Cervical spondylosis without myelopathy-she has neck stiffness and trapezius spasm.  Range of motion exercises was emphasized.  Fibromyalgia-need for regular exercise and stretching was discussed.  Other fatigue-related to hyper myalgia.  Other medical problems are listed as follows:  Prolonged QT interval  History of diabetes insipidus  History of migraine  History of hypothyroidism  History of anxiety  Orders: Orders Placed This Encounter  Procedures   Large Joint Inj   Protein / creatinine ratio, urine   CBC with Differential/Platelet   COMPLETE METABOLIC PANEL WITH GFR   Anti-DNA antibody, double-stranded   C3 and C4   Sedimentation rate   No orders of the defined types were placed in this encounter.    Follow-Up Instructions: Return in about 5 months (around 06/13/2023) for Sjogren's.   Pollyann Savoy, MD  Note - This record has been created using Animal nutritionist.  Chart creation errors have been sought, but may not always  have been located. Such creation errors do not reflect on  the standard of medical care.

## 2023-01-13 ENCOUNTER — Ambulatory Visit: Payer: 59 | Attending: Rheumatology | Admitting: Rheumatology

## 2023-01-13 ENCOUNTER — Encounter: Payer: Self-pay | Admitting: Rheumatology

## 2023-01-13 VITALS — BP 120/73 | HR 73 | Resp 13 | Ht 66.0 in | Wt 148.8 lb

## 2023-01-13 DIAGNOSIS — M47812 Spondylosis without myelopathy or radiculopathy, cervical region: Secondary | ICD-10-CM

## 2023-01-13 DIAGNOSIS — Z8659 Personal history of other mental and behavioral disorders: Secondary | ICD-10-CM

## 2023-01-13 DIAGNOSIS — Z8639 Personal history of other endocrine, nutritional and metabolic disease: Secondary | ICD-10-CM

## 2023-01-13 DIAGNOSIS — M2241 Chondromalacia patellae, right knee: Secondary | ICD-10-CM | POA: Diagnosis not present

## 2023-01-13 DIAGNOSIS — M170 Bilateral primary osteoarthritis of knee: Secondary | ICD-10-CM

## 2023-01-13 DIAGNOSIS — R5383 Other fatigue: Secondary | ICD-10-CM

## 2023-01-13 DIAGNOSIS — M1120 Other chondrocalcinosis, unspecified site: Secondary | ICD-10-CM | POA: Diagnosis not present

## 2023-01-13 DIAGNOSIS — Z79899 Other long term (current) drug therapy: Secondary | ICD-10-CM

## 2023-01-13 DIAGNOSIS — M19041 Primary osteoarthritis, right hand: Secondary | ICD-10-CM

## 2023-01-13 DIAGNOSIS — M3501 Sicca syndrome with keratoconjunctivitis: Secondary | ICD-10-CM | POA: Diagnosis not present

## 2023-01-13 DIAGNOSIS — M25462 Effusion, left knee: Secondary | ICD-10-CM

## 2023-01-13 DIAGNOSIS — M797 Fibromyalgia: Secondary | ICD-10-CM | POA: Diagnosis not present

## 2023-01-13 DIAGNOSIS — M25551 Pain in right hip: Secondary | ICD-10-CM

## 2023-01-13 DIAGNOSIS — M25552 Pain in left hip: Secondary | ICD-10-CM

## 2023-01-13 DIAGNOSIS — J842 Lymphoid interstitial pneumonia: Secondary | ICD-10-CM | POA: Diagnosis not present

## 2023-01-13 DIAGNOSIS — R9431 Abnormal electrocardiogram [ECG] [EKG]: Secondary | ICD-10-CM

## 2023-01-13 DIAGNOSIS — M19042 Primary osteoarthritis, left hand: Secondary | ICD-10-CM

## 2023-01-13 DIAGNOSIS — M722 Plantar fascial fibromatosis: Secondary | ICD-10-CM

## 2023-01-13 DIAGNOSIS — D702 Other drug-induced agranulocytosis: Secondary | ICD-10-CM | POA: Diagnosis not present

## 2023-01-13 DIAGNOSIS — Z8669 Personal history of other diseases of the nervous system and sense organs: Secondary | ICD-10-CM

## 2023-01-13 DIAGNOSIS — G8929 Other chronic pain: Secondary | ICD-10-CM

## 2023-01-13 MED ORDER — TRIAMCINOLONE ACETONIDE 40 MG/ML IJ SUSP
40.0000 mg | INTRAMUSCULAR | Status: AC | PRN
  Administered 2023-01-13: 40 mg via INTRA_ARTICULAR

## 2023-01-13 MED ORDER — LIDOCAINE HCL 1 % IJ SOLN
1.5000 mL | INTRAMUSCULAR | Status: AC | PRN
  Administered 2023-01-13: 1.5 mL

## 2023-01-13 NOTE — Patient Instructions (Signed)
Standing Labs We placed an order today for your standing lab work.   Please have your standing labs drawn in February and every 3 months  Please have your labs drawn 2 weeks prior to your appointment so that the provider can discuss your lab results at your appointment, if possible.  Please note that you may see your imaging and lab results in MyChart before we have reviewed them. We will contact you once all results are reviewed. Please allow our office up to 72 hours to thoroughly review all of the results before contacting the office for clarification of your results.  WALK-IN LAB HOURS  Monday through Thursday from 8:00 am -12:30 pm and 1:00 pm-5:00 pm and Friday from 8:00 am-12:00 pm.  Patients with office visits requiring labs will be seen before walk-in labs.  You may encounter longer than normal wait times. Please allow additional time. Wait times may be shorter on  Monday and Thursday afternoons.  We do not book appointments for walk-in labs. We appreciate your patience and understanding with our staff.   Labs are drawn by [facility]. Please bring your co-pay at the time of your lab draw.  You may receive a bill from [facility] for your lab work.  Please note if you are on Hydroxychloroquine and and an order has been placed for a Hydroxychloroquine level,  you will need to have it drawn 4 hours or more after your last dose.  If you wish to have your labs drawn at another location, please call the office 24 hours in advance so we can fax the orders.  The office is located at [address], [address], [place], [place] [address]   If you have any questions regarding directions or hours of operation,  please call [phone].   As a reminder, please drink plenty of water prior to coming for your lab work. Thanks!   Vaccines You are taking a medication(s) that can suppress your immune system.  The following immunizations are recommended: Flu annually Covid-19  RSV Td/Tdap (tetanus,  diphtheria, pertussis) every 10 years Pneumonia (Prevnar 15 then Pneumovax 23 at least 1 year apart.  Alternatively, can take Prevnar 20 without needing additional dose) Shingrix: 2 doses from 4 weeks to 6 months apart  Please check with your PCP to make sure you are up to date.   If you have signs or symptoms of an infection or start antibiotics: First, call your PCP for workup of your infection. Hold your medication through the infection, until you complete your antibiotics, and until symptoms resolve if you take the following: Injectable medication (Actemra, Benlysta, Cimzia, Cosentyx, Enbrel, Humira, Kevzara, Orencia, Remicade, Simponi, Stelara, Taltz, Tremfya) Methotrexate Leflunomide (Arava) Mycophenolate (Cellcept) Harriette Ohara, Olumiant, or Rinvoq

## 2023-01-14 LAB — COMPLETE METABOLIC PANEL WITH GFR
AG Ratio: 1.3 (calc) (ref 1.0–2.5)
ALT: 17 U/L (ref 6–29)
AST: 26 U/L (ref 10–35)
Albumin: 4.5 g/dL (ref 3.6–5.1)
Alkaline phosphatase (APISO): 132 U/L (ref 37–153)
BUN: 12 mg/dL (ref 7–25)
CO2: 28 mmol/L (ref 20–32)
Calcium: 9.7 mg/dL (ref 8.6–10.4)
Chloride: 102 mmol/L (ref 98–110)
Creat: 0.62 mg/dL (ref 0.50–1.05)
Globulin: 3.4 g/dL (ref 1.9–3.7)
Glucose, Bld: 74 mg/dL (ref 65–99)
Potassium: 3.8 mmol/L (ref 3.5–5.3)
Sodium: 139 mmol/L (ref 135–146)
Total Bilirubin: 0.6 mg/dL (ref 0.2–1.2)
Total Protein: 7.9 g/dL (ref 6.1–8.1)
eGFR: 101 mL/min/{1.73_m2} (ref >=60)

## 2023-01-14 LAB — CBC WITH DIFFERENTIAL/PLATELET
Absolute Lymphocytes: 537 {cells}/uL — ABNORMAL LOW (ref 850–3900)
Absolute Monocytes: 476 {cells}/uL (ref 200–950)
Basophils Absolute: 9 {cells}/uL (ref 0–200)
Basophils Relative: 0.3 %
Eosinophils Absolute: 49 {cells}/uL (ref 15–500)
Eosinophils Relative: 1.7 %
HCT: 38.1 % (ref 35.0–45.0)
Hemoglobin: 12.6 g/dL (ref 11.7–15.5)
MCH: 29.8 pg (ref 27.0–33.0)
MCHC: 33.1 g/dL (ref 32.0–36.0)
MCV: 90.1 fL (ref 80.0–100.0)
MPV: 10.9 fL (ref 7.5–12.5)
Monocytes Relative: 16.4 %
Neutro Abs: 1830 {cells}/uL (ref 1500–7800)
Neutrophils Relative %: 63.1 %
Platelets: 232 10*3/uL (ref 140–400)
RBC: 4.23 10*6/uL (ref 3.80–5.10)
RDW: 13.2 % (ref 11.0–15.0)
Total Lymphocyte: 18.5 %
WBC: 2.9 10*3/uL — ABNORMAL LOW (ref 3.8–10.8)

## 2023-01-14 LAB — PROTEIN / CREATININE RATIO, URINE
Creatinine, Urine: 21 mg/dL (ref 20–275)
Total Protein, Urine: <4 mg/dL — ABNORMAL LOW (ref 5–24)

## 2023-01-14 LAB — C3 AND C4
C3 Complement: 162 mg/dL (ref 83–193)
C4 Complement: 28 mg/dL (ref 15–57)

## 2023-01-14 LAB — ANTI-DNA ANTIBODY, DOUBLE-STRANDED: ds DNA Ab: 1 [IU]/mL

## 2023-01-14 LAB — SEDIMENTATION RATE: Sed Rate: 38 mm/h — ABNORMAL HIGH (ref 0–30)

## 2023-01-15 ENCOUNTER — Ambulatory Visit
Admission: RE | Admit: 2023-01-15 | Discharge: 2023-01-15 | Disposition: A | Discharge status: Home / Self Care | Payer: 59 | Source: Ambulatory Visit | Attending: Internal Medicine | Admitting: Internal Medicine

## 2023-01-15 DIAGNOSIS — Z1231 Encounter for screening mammogram for malignant neoplasm of breast: Secondary | ICD-10-CM | POA: Diagnosis not present

## 2023-01-15 NOTE — Progress Notes (Signed)
White cell count is low and stable.  Urine protein creatinine ratio normal, double-stranded DNA negative, comprehensive metabolic panel normal, complements normal sed rate 38 which remains elevated.  No change in treatment advised.

## 2023-01-22 ENCOUNTER — Other Ambulatory Visit: Payer: Self-pay | Admitting: Medical Oncology

## 2023-01-22 DIAGNOSIS — D72819 Decreased white blood cell count, unspecified: Secondary | ICD-10-CM

## 2023-01-25 ENCOUNTER — Inpatient Hospital Stay: Payer: 59 | Attending: Internal Medicine

## 2023-01-25 ENCOUNTER — Inpatient Hospital Stay: Payer: 59 | Admitting: Internal Medicine

## 2023-01-25 ENCOUNTER — Other Ambulatory Visit: Payer: Self-pay

## 2023-01-25 VITALS — BP 115/71 | HR 71 | Temp 97.5°F | Resp 17 | Ht 66.0 in | Wt 144.5 lb

## 2023-01-25 DIAGNOSIS — M329 Systemic lupus erythematosus, unspecified: Secondary | ICD-10-CM | POA: Insufficient documentation

## 2023-01-25 DIAGNOSIS — D72819 Decreased white blood cell count, unspecified: Secondary | ICD-10-CM | POA: Insufficient documentation

## 2023-01-25 DIAGNOSIS — M069 Rheumatoid arthritis, unspecified: Secondary | ICD-10-CM | POA: Diagnosis not present

## 2023-01-25 DIAGNOSIS — M1712 Unilateral primary osteoarthritis, left knee: Secondary | ICD-10-CM | POA: Insufficient documentation

## 2023-01-25 DIAGNOSIS — M3500 Sicca syndrome, unspecified: Secondary | ICD-10-CM | POA: Insufficient documentation

## 2023-01-25 DIAGNOSIS — I340 Nonrheumatic mitral (valve) insufficiency: Secondary | ICD-10-CM | POA: Diagnosis not present

## 2023-01-25 LAB — CBC WITH DIFFERENTIAL (CANCER CENTER ONLY)
Abs Immature Granulocytes: 0.01 10*3/uL (ref 0.00–0.07)
Basophils Absolute: 0 10*3/uL (ref 0.0–0.1)
Basophils Relative: 1 %
Eosinophils Absolute: 0.1 10*3/uL (ref 0.0–0.5)
Eosinophils Relative: 2 %
HCT: 38.5 % (ref 36.0–46.0)
Hemoglobin: 12.9 g/dL (ref 12.0–15.0)
Immature Granulocytes: 0 %
Lymphocytes Relative: 16 %
Lymphs Abs: 0.4 10*3/uL — ABNORMAL LOW (ref 0.7–4.0)
MCH: 29.9 pg (ref 26.0–34.0)
MCHC: 33.5 g/dL (ref 30.0–36.0)
MCV: 89.3 fL (ref 80.0–100.0)
Monocytes Absolute: 0.4 10*3/uL (ref 0.1–1.0)
Monocytes Relative: 17 %
Neutro Abs: 1.7 10*3/uL (ref 1.7–7.7)
Neutrophils Relative %: 64 %
Platelet Count: 216 10*3/uL (ref 150–400)
RBC: 4.31 MIL/uL (ref 3.87–5.11)
RDW: 13.5 % (ref 11.5–15.5)
WBC Count: 2.6 10*3/uL — ABNORMAL LOW (ref 4.0–10.5)
nRBC: 0 % (ref 0.0–0.2)

## 2023-01-25 LAB — CMP (CANCER CENTER ONLY)
ALT: 15 U/L (ref 0–44)
AST: 21 U/L (ref 15–41)
Albumin: 4.2 g/dL (ref 3.5–5.0)
Alkaline Phosphatase: 106 U/L (ref 38–126)
Anion gap: 6 (ref 5–15)
BUN: 13 mg/dL (ref 8–23)
CO2: 28 mmol/L (ref 22–32)
Calcium: 9.5 mg/dL (ref 8.9–10.3)
Chloride: 104 mmol/L (ref 98–111)
Creatinine: 0.71 mg/dL (ref 0.44–1.00)
GFR, Estimated: >60 mL/min (ref >60)
Glucose, Bld: 90 mg/dL (ref 70–99)
Potassium: 3.9 mmol/L (ref 3.5–5.1)
Sodium: 138 mmol/L (ref 135–145)
Total Bilirubin: 0.7 mg/dL (ref <1.2)
Total Protein: 7.6 g/dL (ref 6.5–8.1)

## 2023-01-25 NOTE — Progress Notes (Signed)
[facility] [phone]   [phone]  OFFICE PROGRESS NOTE  Creola Corn, MD [address] 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   PRIOR THERAPY: None  CURRENT THERAPY: Observation.   INTERVAL HISTORY: Stefanie [name] 62 y.o. female returns to the clinic today for follow-up visit.Discussed the use of AI scribe software for clinical note transcription with the patient, who gave verbal consent to proceed.  History of Present Illness   Stefanie [name], a 62 year old patient with a history of autoimmune disorders including rheumatoid arthritis, lupus, and Sjogren's syndrome, was referred for evaluation of persistent leukopenia. The patient's white blood cell count has been consistently low, with a recent count of 2.6, similar to previous readings. The patient's absolute neutrophil count, however, remains within acceptable limits.  The patient reports no significant changes in health since the last consultation, but notes an increase in dryness of the eyes and mouth, symptoms consistent with her known Sjogren's syndrome. She has been advised to use preservative eye drops and a nighttime gel to manage the dryness.  In addition to the autoimmune conditions, the patient has a cardiac history and is under the care of a cardiologist. She reports a recent episode of mild, sharp chest pains, but no severe chest pain. The patient also mentions a need for a knee replacement due to ongoing issues with the left knee, but is attempting to delay the procedure.  The patient works in a daycare setting and has been advised to take precautions due to the low white blood cell count, particularly during the winter months and when children are ill. Despite the multiple health issues, the patient appears to be  managing her conditions with ongoing medical supervision.       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  MEDICATIONS:  Current Outpatient Medications  Medication Sig Dispense Refill   acetaminophen (TYLENOL) 500 MG tablet Take 1,000 mg by mouth every 6 (six) hours as needed for moderate pain.     albuterol (PROVENTIL HFA;VENTOLIN HFA) 108 (90 BASE) MCG/ACT inhaler Inhale 1 puff into the lungs every 6 (six) hours as needed for wheezing or shortness of breath.     azaTHIOprine (IMURAN) 50 MG tablet TAKE 1 TABLET BY MOUTH EVERY DAY 90 tablet 0   HYDROcodone-acetaminophen (NORCO) 5-325 MG tablet Take 1 tablet by mouth 3 (three) times daily as needed. To be taken after surgery (Patient taking differently: Take 1 tablet by mouth as needed. To be taken after surgery) 20 tablet 0   ibuprofen (ADVIL) 200 MG tablet Take 200 mg by mouth every 6 (six) hours as needed.     levothyroxine (SYNTHROID) 112 MCG tablet  Take 112 mcg by mouth daily before breakfast.     levothyroxine (SYNTHROID, LEVOTHROID) 125 MCG tablet Take 125 mcg by mouth daily before breakfast. (Patient not taking: Reported on 01/13/2023)     loratadine (CLARITIN) 10 MG tablet Take 10 mg by mouth daily.     MITIGARE 0.6 MG CAPS TAKE 1 CAPSULE BY MOUTH DAILY. 90 capsule 0   Polyvinyl Alcohol-Povidone (REFRESH OP) Place 1 drop into both eyes as needed  (dry eyes).     No current facility-administered medications for this visit.    SURGICAL HISTORY:  Past Surgical History:  Procedure Laterality Date   COLONOSCOPY     KNEE ARTHROSCOPY WITH MENISCAL REPAIR Left 12/12/2021   Procedure: LEFT KNEE ARTHROSCOPIC DEBRIDEMENT, LATERAL MENISCECTOMY;  Surgeon: Tarry Kos, MD;  Location: [facility] OR;  Service: Orthopedics;  Laterality: Left;   PITUITARY EXCISION  1993   TONSILLECTOMY      REVIEW OF SYSTEMS:  A comprehensive review of systems was negative.   PHYSICAL EXAMINATION: General appearance: alert, cooperative, fatigued, and no distress Head: Normocephalic, without obvious abnormality, atraumatic Neck: no adenopathy, no JVD, supple, symmetrical, trachea midline, and thyroid not enlarged, symmetric, no tenderness/mass/nodules Lymph nodes: Cervical, supraclavicular, and axillary nodes normal. Resp: clear to auscultation bilaterally Back: symmetric, no curvature. ROM normal. No CVA tenderness. Cardio: regular rate and rhythm, S1, S2 normal, no murmur, click, rub or gallop GI: soft, non-tender; bowel sounds normal; no masses,  no organomegaly Extremities: extremities normal, atraumatic, no cyanosis or edema  ECOG PERFORMANCE STATUS: 1 - Symptomatic but completely ambulatory  Blood pressure 115/71, pulse 71, temperature (!) 97.5 F (36.4 C), temperature source Temporal, resp. rate 17, height 5\' 6"  (1.676 m), weight 144 lb 8 oz (65.5 kg), last menstrual period 04/09/2014, SpO2 100%.  LABORATORY DATA: Lab Results  Component Value Date   WBC 2.6 (L) 01/25/2023   HGB 12.9 01/25/2023   HCT 38.5 01/25/2023   MCV 89.3 01/25/2023   PLT 216 01/25/2023      Chemistry      Component Value Date/Time   NA 139 01/13/2023 1202   NA 138 04/24/2020 0946   K 3.8 01/13/2023 1202   CL 102 01/13/2023 1202   CO2 28 01/13/2023 1202   BUN 12 01/13/2023 1202   BUN 13 04/24/2020 0946   CREATININE 0.62 01/13/2023 1202      Component Value Date/Time    CALCIUM 9.7 01/13/2023 1202   ALKPHOS 132 (H) 11/07/2022 0835   AST 26 01/13/2023 1202   AST 27 11/07/2022 0835   ALT 17 01/13/2023 1202   ALT 18 11/07/2022 0835   BILITOT 0.6 01/13/2023 1202   BILITOT 0.7 11/07/2022 0835       RADIOGRAPHIC STUDIES: MM 3D SCREENING MAMMOGRAM BILATERAL BREAST  Result Date: 01/19/2023 CLINICAL DATA:  Screening. EXAM: DIGITAL SCREENING BILATERAL MAMMOGRAM WITH TOMOSYNTHESIS AND CAD TECHNIQUE: Bilateral screening digital craniocaudal and mediolateral oblique mammograms were obtained. Bilateral screening digital breast tomosynthesis was performed. The images were evaluated with computer-aided detection. COMPARISON:  Previous exam(s). ACR Breast Density Category b: There are scattered areas of fibroglandular density. FINDINGS: There are no findings suspicious for malignancy. IMPRESSION: No mammographic evidence of malignancy. A result letter of this screening mammogram will be mailed directly to the patient. RECOMMENDATION: Screening mammogram in one year. (Code:SM-B-01Y) BI-RADS CATEGORY  1: Negative. Electronically Signed   By: Elberta Fortis M.D.   On: 01/19/2023 15:29   XR KNEE 3 VIEW RIGHT  Result Date: 12/29/2022 X-rays of the right  knee show moderate tricompartmental osteoarthritis with mild joint space narrowing.  No malalignments.  XR KNEE 3 VIEW LEFT  Result Date: 12/29/2022 X-rays of the knee shows advanced degenerative changes especially in the lateral compartment with bone-on-bone joint space narrowing and periarticular spurring.  Mild valgus alignment.   ASSESSMENT AND PLAN:     Leukopenia Chronic low WBC, likely secondary to autoimmune disorders (rheumatoid arthritis, lupus, Sjogren's syndrome). Current WBC is 2.6, similar to previous values (2.5-2.9). Absolute neutrophil count is 1700, adequate for infection control. Discussed potential need for steroids or bone marrow biopsy if WBC significantly decreases. - Monitor WBC and neutrophil  count - Consider steroids or bone marrow biopsy if WBC significantly decreases - Follow-up in six months  Rheumatoid Arthritis Chronic autoimmune disorder contributing to leukopenia. No new symptoms reported. - Continue current management  Lupus Chronic autoimmune disorder contributing to leukopenia. No new symptoms reported. - Continue current management  Sjogren's Syndrome Chronic autoimmune disorder contributing to leukopenia. Symptoms of dry eyes and mouth reported. Advised to use preservative-free eye drops and nighttime gel as recommended by ophthalmologist. - Use preservative-free eye drops - Use nighttime gel for dry eyes  Mitral Valve Regurgitation Mild mitral valve leak noted by cardiologist. Recent mild, sharp chest pains reported, but not severe. Advised to monitor symptoms and follow up with cardiologist as needed. - Monitor symptoms - Follow-up with cardiologist as needed  Osteoarthritis of the Knee Requires left knee replacement but is currently delaying the procedure. Discussed that delaying surgery may lead to increased pain and decreased mobility over time. 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 Advised to take precautions during winter, including wearing a mask around sick children to reduce risk of infection due to low WBC. - Wear a mask, especially around sick children  Follow-up - Follow-up in six months.   She was advised to call immediately if she has any other concerning symptoms in the interval. The patient voices understanding of current disease status and treatment options and is in agreement with the current care plan.  All questions were answered. The patient knows to call the clinic with any problems, questions or concerns. We can certainly see the patient much sooner if necessary.  The total time spent in the appointment was 20  minutes.  Disclaimer: This note was dictated with voice recognition software. Similar sounding words can inadvertently be transcribed and may not be corrected upon review.

## 2023-01-29 ENCOUNTER — Ambulatory Visit
Admission: RE | Admit: 2023-01-29 | Discharge: 2023-01-29 | Disposition: A | Discharge status: Home / Self Care | Payer: 59 | Source: Ambulatory Visit | Attending: Pulmonary Disease | Admitting: Pulmonary Disease

## 2023-01-29 DIAGNOSIS — R0602 Shortness of breath: Secondary | ICD-10-CM | POA: Diagnosis not present

## 2023-01-29 DIAGNOSIS — J984 Other disorders of lung: Secondary | ICD-10-CM | POA: Diagnosis not present

## 2023-01-29 DIAGNOSIS — R0609 Other forms of dyspnea: Secondary | ICD-10-CM

## 2023-01-29 DIAGNOSIS — M3501 Sicca syndrome with keratoconjunctivitis: Secondary | ICD-10-CM

## 2023-02-16 ENCOUNTER — Encounter: Payer: Self-pay | Admitting: Pulmonary Disease

## 2023-02-16 ENCOUNTER — Ambulatory Visit: Payer: 59 | Admitting: Pulmonary Disease

## 2023-02-16 VITALS — BP 119/70 | HR 65 | Temp 98.6°F | Ht 66.0 in | Wt 145.0 lb

## 2023-02-16 DIAGNOSIS — J4520 Mild intermittent asthma, uncomplicated: Secondary | ICD-10-CM

## 2023-02-16 DIAGNOSIS — J842 Lymphoid interstitial pneumonia: Secondary | ICD-10-CM | POA: Diagnosis not present

## 2023-02-16 DIAGNOSIS — M3502 Sicca syndrome with lung involvement: Secondary | ICD-10-CM

## 2023-02-16 NOTE — Progress Notes (Signed)
Stefanie [name]    [id number]    [birth year]  Primary Care Physician:Russo, Jonny Ruiz, MD  Referring Physician: Creola Corn, MD [address] [place],  [place] [address]  Problem list:  Follow-up for Sjogren's syndrome, on Imuran since 2021 ILD, lymphocytic interstitial pneumonia  HPI: 62 year old with history of asthma, palpitations, Sjogrens syndrome Sent in for evaluation of chronic dyspnea on exertion for the past few years associated with chest tightness, occasional wheezing She is on albuterol inhaler for asthma which she uses very seldom Also has seasonal allergies for which she uses over-the-counter antihistamines.  Follows with Dr. Corliss Skains, rheumatology with positive ANA, Ro, La, Cardiolipin and rheumatoid factor.   Started on Imuran in 2021 rheumatology  She contracted COVID twice in February 2023 treated with Paxlovid and molnupiravir and then had flu infection.  Overall improved since this episode.  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  No mold, hot tub, Jacuzzi.  No down pillows or comforter Smoking history: Never smoker Travel history: No significant travel history Relevant family history: No significant family history of lung disease  Interim history: Discussed the use of AI scribe software for clinical note transcription with the patient, who gave verbal consent to proceed.  The patient, with a history of Sjogren's syndrome and interstitial lung disease, presents for a follow-up visit. She reports no new or worsening symptoms. She is currently on Imuran, which appears to be managing her condition effectively. Her most recent CT scan shows stable changes, and her lung function tests from earlier this year were normal. She also has a history of mild asthma, which is managed with an albuterol inhaler used seasonally or as needed. She has a concurrent diagnosis of lupus  and thyroid disease, which contribute to her overall fatigue.   Outpatient Encounter Medications as of 02/16/2023  Medication Sig   acetaminophen (TYLENOL) 500 MG tablet Take 1,000 mg by mouth every 6 (six) hours as needed for moderate pain.   albuterol (PROVENTIL HFA;VENTOLIN HFA) 108 (90 BASE) MCG/ACT inhaler Inhale 1 puff into the lungs every 6 (six) hours as needed for wheezing or shortness of breath.   azaTHIOprine (IMURAN) 50 MG tablet TAKE 1 TABLET BY MOUTH EVERY DAY   HYDROcodone-acetaminophen (NORCO) 5-325 MG tablet Take 1 tablet by mouth 3 (three) times daily as needed. To be taken after surgery (Patient taking differently: Take 1 tablet by mouth as needed. To be taken after surgery)   ibuprofen (ADVIL) 200 MG tablet Take 200 mg by mouth every 6 (six) hours as needed.   levothyroxine (SYNTHROID) 112 MCG tablet Take 112 mcg by mouth daily before breakfast.   loratadine (CLARITIN) 10 MG tablet Take 10 mg by mouth daily.   MITIGARE 0.6 MG CAPS TAKE 1 CAPSULE BY MOUTH DAILY.   Polyvinyl Alcohol-Povidone (REFRESH OP) Place 1 drop into both eyes as needed (dry eyes).   [DISCONTINUED] levothyroxine (SYNTHROID, LEVOTHROID) 125 MCG tablet Take 125 mcg by mouth daily before breakfast. (Patient not taking: Reported on 01/13/2023)   No facility-administered encounter medications on file as of 02/16/2023.   Physical Exam: Blood pressure 119/70, pulse 65, temperature 98.6 F (37 C), temperature source Oral, height 5\' 6"  (1.676 m), weight 145 lb (65.8 kg), last menstrual period 04/09/2014, SpO2 99%. Gen:      No acute distress HEENT:  EOMI, sclera anicteric Neck:     No masses; no thyromegaly Lungs:    Clear  to auscultation bilaterally; normal respiratory effort CV:         Regular rate and rhythm; no murmurs Abd:      + bowel sounds; soft, non-tender; no palpable masses, no distension Ext:    No edema; adequate peripheral perfusion Skin:      Warm and dry; no rash Neuro: alert and oriented x  3 Psych: normal mood and affect   Data Reviewed: Imaging: CTA 08/16/2014-mild right middle lobe bronchovascular opacities with tree-in-bud, scattered lung cysts. High-res CT 08/01/2019-cystic and nodular interstitial lung disease consistent with lymphocytic interstitial pneumonia.  Alternate diagnosis. CTA 03/26/2021-no pulmonary embolism, stable bullous changes, pulmonary nodules High resolution CT 11/03/2021-scattered cysts and pulmonary nodules which are stable consistent with LIP.  No evidence of progression. High resolution CT 01/29/2023-stable scattered interstitial changes consistent with LIP I have reviewed the images personally.  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 CT scan is stable and lung function tests are normal. Patient is asymptomatic.  -Continue Imuran 50mg  daily. -Repeat CT scan in 1 year. -If symptoms such as increased cough, shortness of breath, fatigue, or chest congestion develop, patient should seek medical attention sooner.  Mild Asthma Seasonal symptoms managed with albuterol as needed. -Continue current management with albuterol PRN.  Follow-up in 1 year or sooner if symptoms develop.   Plan/Recommendations: High-resolution CT and PFTs in 12 months  Chilton Greathouse MD [facility] 02/16/2023, 4:21 PM  CC: Creola Corn, MD

## 2023-02-16 NOTE — Patient Instructions (Signed)
VISIT SUMMARY:  You had a follow-up visit today to check on your interstitial lung disease, Sjogren's syndrome, mild asthma, lupus, and thyroid disease. Your conditions are stable, and your current medications are effectively managing your symptoms. Your recent CT scan and lung function tests were normal.  YOUR PLAN:  -INTERSTITIAL LUNG DISEASE SECONDARY TO SJOGREN'S SYNDROME: Interstitial lung disease is a group of lung disorders that cause scarring of lung tissues, often leading to breathing problems. Your condition is stable with your current medication, Imuran 50mg  daily. We will repeat your CT scan in one year. If you experience increased cough, shortness of breath, fatigue, or chest congestion, please seek medical attention sooner.  -MILD ASTHMA: Asthma is a condition where your airways narrow and swell, producing extra mucus and making it difficult to breathe. Your mild asthma is managed with an albuterol inhaler as needed, especially during seasonal changes. Continue using your inhaler as required.  -SJOGREN'S SYNDROME: Sjogren's syndrome is an autoimmune disorder that often causes dry eyes and mouth. Continue with your current management for dry mouth.  INSTRUCTIONS:  Please follow up in one year for a repeat CT scan and lung function tests, or sooner if you develop any new or worsening symptoms.

## 2023-02-18 DIAGNOSIS — J842 Lymphoid interstitial pneumonia: Secondary | ICD-10-CM | POA: Diagnosis not present

## 2023-02-18 DIAGNOSIS — M3210 Systemic lupus erythematosus, organ or system involvement unspecified: Secondary | ICD-10-CM | POA: Diagnosis not present

## 2023-02-18 DIAGNOSIS — E039 Hypothyroidism, unspecified: Secondary | ICD-10-CM | POA: Diagnosis not present

## 2023-02-18 DIAGNOSIS — I700 Atherosclerosis of aorta: Secondary | ICD-10-CM | POA: Diagnosis not present

## 2023-02-18 DIAGNOSIS — M3501 Sicca syndrome with keratoconjunctivitis: Secondary | ICD-10-CM | POA: Diagnosis not present

## 2023-02-18 DIAGNOSIS — F419 Anxiety disorder, unspecified: Secondary | ICD-10-CM | POA: Diagnosis not present

## 2023-02-18 DIAGNOSIS — R29818 Other symptoms and signs involving the nervous system: Secondary | ICD-10-CM | POA: Diagnosis not present

## 2023-02-18 DIAGNOSIS — D899 Disorder involving the immune mechanism, unspecified: Secondary | ICD-10-CM | POA: Diagnosis not present

## 2023-02-18 DIAGNOSIS — M8580 Other specified disorders of bone density and structure, unspecified site: Secondary | ICD-10-CM | POA: Diagnosis not present

## 2023-02-18 DIAGNOSIS — I340 Nonrheumatic mitral (valve) insufficiency: Secondary | ICD-10-CM | POA: Diagnosis not present

## 2023-02-18 DIAGNOSIS — D72819 Decreased white blood cell count, unspecified: Secondary | ICD-10-CM | POA: Diagnosis not present

## 2023-02-18 DIAGNOSIS — E871 Hypo-osmolality and hyponatremia: Secondary | ICD-10-CM | POA: Diagnosis not present

## 2023-02-25 ENCOUNTER — Ambulatory Visit: Payer: 59 | Admitting: Physician Assistant

## 2023-03-23 ENCOUNTER — Other Ambulatory Visit: Payer: Self-pay | Admitting: Physician Assistant

## 2023-03-23 NOTE — Telephone Encounter (Signed)
 Last Fill: 12/24/2022  Labs: 01/25/2023 WBC 2.6, Lymphs Abs 0.4  Next Visit: 06/14/2023  Last Visit: 01/13/2023  DX: Sjogren's syndrome with keratoconjunctivitis sicca   Current Dose per office note 01/13/2023: Imuran  50 mg 1 tablet by mouth daily   Okay to refill Imuran ?

## 2023-03-24 ENCOUNTER — Ambulatory Visit (HOSPITAL_BASED_OUTPATIENT_CLINIC_OR_DEPARTMENT_OTHER): Payer: 59 | Admitting: Cardiology

## 2023-03-24 ENCOUNTER — Encounter (HOSPITAL_BASED_OUTPATIENT_CLINIC_OR_DEPARTMENT_OTHER): Payer: Self-pay | Admitting: Cardiology

## 2023-03-24 VITALS — BP 120/70 | HR 82 | Ht 66.0 in | Wt 145.0 lb

## 2023-03-24 DIAGNOSIS — Z713 Dietary counseling and surveillance: Secondary | ICD-10-CM | POA: Diagnosis not present

## 2023-03-24 DIAGNOSIS — R55 Syncope and collapse: Secondary | ICD-10-CM

## 2023-03-24 DIAGNOSIS — I479 Paroxysmal tachycardia, unspecified: Secondary | ICD-10-CM | POA: Diagnosis not present

## 2023-03-24 DIAGNOSIS — Z7182 Exercise counseling: Secondary | ICD-10-CM | POA: Diagnosis not present

## 2023-03-24 DIAGNOSIS — Z8249 Family history of ischemic heart disease and other diseases of the circulatory system: Secondary | ICD-10-CM

## 2023-03-24 NOTE — Progress Notes (Signed)
 Cardiology Office Note:  .   Date:  03/24/2023  ID:  Stefanie [name], DOB 22-Nov-1960, MRN [id number] PCP: Margarete Sharps, MD  [facility] Providers Cardiologist:  Sheryle Donning, MD {  History of Present Illness: .   Stefanie [name] is a 63 y.o. female with PMH fibromyalgia, RA, lymphopid interstitial pneumonitis. She was previously seen by Dr. Albert Huff, and I met her 03/24/23.  CV history: Echo 09/09/2021 (PCV) noted as LVEF 60-65%, normal diastology, mild MR. Office notes comment on normal RV/PA pressures though not on report. Monitor 2020 unremarkable. Stress test 2018 with EF 64%, no ECG changes, no ischemia, breast attenuation artifact.  Today: For the last few years, has had episodes of near syncope while sitting at her desk mid day. Feels weak, like everything is closing in on her. Dr. Mamie Searles referred her to Dr. Festus Hubert for neuro evaluation, no seizures found. Evaluated by Dr. Albert Huff at [facility] as above.  Hadn't had any episodes in some time (was happening once every few months for a while), but then had three episodes in a few days. Had an episode recently, has someone check her pulse and they said it was going really fast. Blood pressure after the event was 139/71. Witness said her hands were trembling. No loss of consciousness, witness said she was responding. She feels that many of the events occur when she is looking at the computer.  Mother had afib, diagnosed when she was 15 (about 6 months before she passed away). Father had heart attack in his 24s and stroke at age 9.  ROS positive for severe fatigue. Husband says she falls asleep easily. Positive for diffuse muscle cramps. Has had low sodium issues in the past; avoids straight water but drinks gatorade/powerade.  No routine exercise, but moves around a lot at home.  ROS: Denies chest pain, shortness of breath at rest or with normal exertion. No PND, orthopnea, LE edema or unexpected weight gain. No  syncope. ROS otherwise negative except as noted.   Studies Reviewed: Aaron Aas    EKG:  EKG Interpretation Date/Time:  Wednesday March 24 2023 14:39:03 EST Ventricular Rate:  82 PR Interval:  188 QRS Duration:  76 QT Interval:  360 QTC Calculation: 420 R Axis:   44  Text Interpretation: Normal sinus rhythm Nonspecific ST abnormality Confirmed by Sheryle Donning [phone]) on 03/24/2023 2:39:55 PM    Physical Exam:   VS:  Ht 5\' 6"  (1.676 m)   Wt 145 lb (65.8 kg)   LMP 04/09/2014 (Approximate)   BMI 23.40 kg/m    Wt Readings from Last 3 Encounters:  03/24/23 145 lb (65.8 kg)  02/16/23 145 lb (65.8 kg)  01/25/23 144 lb 8 oz (65.5 kg)    GEN: Well nourished, well developed in no acute distress HEENT: Normal, moist mucous membranes NECK: No JVD CARDIAC: regular rhythm, normal S1 and S2, no rubs or gallops. No murmur. VASCULAR: Radial and DP pulses 2+ bilaterally. No carotid bruits RESPIRATORY:  Clear to auscultation without rales, wheezing or rhonchi  ABDOMEN: Soft, non-tender, non-distended MUSCULOSKELETAL:  Ambulates independently SKIN: Warm and dry, no edema NEUROLOGIC:  Alert and oriented x 3. No focal neuro deficits noted. PSYCHIATRIC:  Normal affect    ASSESSMENT AND PLAN: .    Presyncope Paroxysmal tachycardia -reviewed prior cardiac and neuro workup, which are reassuring -reviewed reflex/vasovagal pattern at length. Her prodrome is very suggestive of this. Reviewed how hydration, salt/electrolytes, blood pooling affect this. Reviewed normal HR/BP response and how this  can progress to presyncope/syncope -reviewed diet/fluid/exercise recommendations  Recommended the following: -avoid dehydration. Often it requires high volumes of fluids, often with salt/electrolytes included, to stay hydrated. People with orthostasis are very sensitive to fluid shifts and dehydration. Oral rehydration is preferred, and routine use of IV fluids is not recommended. -if tolerated,  compression stocking can assist with fluid management and prevent pooling in the legs. -slow position changes are recommended -if there is a feeling of severe lightheadedness, like near to passing out, recommend lying on the floor on the back, with legs elevated up on a chair or up against the wall. -the best long term management is gradual exercise conditioning. I recommend seated exercises such as bike to start, to avoid the risk of falling with lightheadedness. Exercise programs, either through supervised programs like cardiac rehab or through personal programs, should focus on gradually increasing exercise tolerance and conditioning.    Family history of heart disease Aortic atherosclerosis on CT Autoimmune history -discussed in general, will review in more detail at follow up  Dispo: 6 mos or sooner as needed  Signed, Sheryle Donning, MD   Sheryle Donning, MD, PhD, [facility] [place]  [facility]  [place]  [facility] at [facility] at [facility] [address] [place], [place] [address] [phone]

## 2023-03-24 NOTE — Patient Instructions (Addendum)
 Medication Instructions:  Your physician recommends that you continue on your current medications as directed. Please refer to the Current Medication list given to you today.  *If you need a refill on your cardiac medications before your next appointment, please call your pharmacy*   Lab Work: None ordered.   Testing/Procedures: None ordered.   Follow-Up: At [facility], you and your health needs are our priority.  As part of our continuing mission to provide you with exceptional heart care, we have created designated Provider Care Teams.  These Care Teams include your primary Cardiologist (physician) and Advanced Practice Providers (APPs -  Physician Assistants and Nurse Practitioners) who all work together to provide you with the care you need, when you need it.  We recommend signing up for the patient portal called "MyChart".  Sign up information is provided on this After Visit Summary.  MyChart is used to connect with patients for Virtual Visits (Telemedicine).  Patients are able to view lab/test results, encounter notes, upcoming appointments, etc.  Non-urgent messages can be sent to your provider as well.   To learn more about what you can do with MyChart, go to [email or web address].    Your next appointment:   6 month(s)  Provider:   Sheryle Donning, MD    Other Instructions - avoid dehydration. Often it requires high volumes of fluids, often with salt/electrolytes included, to stay hydrated. People with orthostasis are very sensitive to fluid shifts and dehydration. Oral rehydration is preferred, and routine use of IV fluids is not recommended.  - if tolerated, compression stocking can assist with fluid management and prevent pooling in the legs. - slow position changes are recommended - if there is a feeling of severe lightheadedness, like near to passing out, recommend lying on the floor on the back, with legs elevated up on a chair or up against the  wall. - the best long term management is gradual exercise conditioning. I recommend seated exercises such as bike to start, to avoid the risk of falling with lightheadedness. Exercise programs, either through supervised programs like cardiac rehab or through personal programs, should focus on gradually increasing exercise tolerance and conditioning.    Look into the [employer] mobile.

## 2023-04-06 ENCOUNTER — Other Ambulatory Visit: Payer: Self-pay | Admitting: Physician Assistant

## 2023-04-06 ENCOUNTER — Telehealth: Payer: Self-pay | Admitting: Rheumatology

## 2023-04-06 NOTE — Telephone Encounter (Signed)
Patient is requesting a handicap placard to use at work.  Patient states employee parking is a long distance from the entrance to work.  Patient has increased left leg pain and swelling.

## 2023-04-06 NOTE — Telephone Encounter (Signed)
Okay to get the handicap placard.

## 2023-04-07 NOTE — Telephone Encounter (Signed)
Patient advised her paperwork for the handicap placard is ready at the front desk. Patient expressed understanding.

## 2023-04-07 NOTE — Telephone Encounter (Signed)
Last Fill: 12/24/2022  Labs: 01/25/2023 WBC 2.6, Lymphs Abs 0.4  Next Visit: 06/14/2023  Last Visit: 01/13/2023  DX: Chondrocalcinosis   Current Dose per office note 01/13/2023: Mitigare 0.6 mg 1 capsule by mouth daily.   Okay to refill Colchicine?

## 2023-05-31 NOTE — Progress Notes (Deleted)
 Office Visit Note  Patient: Stefanie [name]             Date of Birth: 17-Jul-1960           MRN: [id number]             PCP: Creola Corn, MD Referring: Creola Corn, MD Visit Date: 06/14/2023 Occupation: @GUAROCC @  Subjective:  No chief complaint on file.   History of Present Illness: Stefanie [name] is a 63 y.o. female ***     Activities of Daily Living:  Patient reports morning stiffness for *** {minute/hour:19697}.   Patient {ACTIONS;DENIES/REPORTS:21021675::"Denies"} nocturnal pain.  Difficulty dressing/grooming: {ACTIONS;DENIES/REPORTS:21021675::"Denies"} Difficulty climbing stairs: {ACTIONS;DENIES/REPORTS:21021675::"Denies"} Difficulty getting out of chair: {ACTIONS;DENIES/REPORTS:21021675::"Denies"} Difficulty using hands for taps, buttons, cutlery, and/or writing: {ACTIONS;DENIES/REPORTS:21021675::"Denies"}  No Rheumatology ROS completed.   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   Prolonged QT interval 07/28/2016   Primary osteoarthritis of both hands 05/11/2016   Primary osteoarthritis of both knees 05/11/2016   History of hypothyroidism 05/11/2016   History of migraine 05/11/2016   Autoimmune disease (HCC) positive ANA, 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, 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, anticardiolipin antibody and positive rheumatoid factor 04/29/2016   ANA positive 04/29/2016   Rheumatoid factor positive 04/29/2016   Pseudogout 04/29/2016   Chondrocalcinosis 04/29/2016   High risk medication use 04/29/2016   Sjogren's syndrome with  keratoconjunctivitis sicca (HCC) 04/29/2016   History of neutropenia 04/29/2016   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   Hyponatremia 12/12/2013   Fever 12/13/2012   Pleuritic chest pain 12/06/2012   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   Anxiety 12/06/2012   ANEMIA-IRON DEFICIENCY 01/08/2010   GASTRIC DILATION, ACUTE 01/08/2010   NAUSEA ALONE 01/08/2010   ABDOMINAL PAIN, LEFT UPPER QUADRANT 01/08/2010    Past Medical History:  Diagnosis Date   Anemia    Arthritis    Asthma    DDD (degenerative disc disease), lumbar    Diabetes insipidus (HCC)    Fibromyalgia    Headache    hx of migraines   Hypothyroidism    Lupus    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   Mitral valve prolapse    pt had echo on 09/04/21   PONV (postoperative nausea and vomiting)    PVC's (premature ventricular contractions)    SLE (systemic lupus erythematosus) (HCC) 12/06/2012   Thyroid disease     Family History  Problem Relation Age of Onset   Atrial fibrillation Mother    Dementia Mother    Diabetes Father    Heart disease Father    Hyperlipidemia Father    Hypertension Father    Stroke Father    Diabetes Sister    Kidney disease Sister        transplant   Heart disease Sister        open heart surgery    Pancreatic disease Sister        transplant    Cancer Maternal Grandmother    Diabetes Paternal Grandfather    Heart disease Paternal Grandfather    Hyperlipidemia Paternal  Grandfather    Hypertension Paternal Actor    Healthy Daughter    Healthy Son    Colon cancer Neg Hx    Esophageal cancer Neg Hx    Rectal cancer Neg Hx    Stomach cancer Neg Hx    Past Surgical History:  Procedure Laterality Date   COLONOSCOPY     KNEE ARTHROSCOPY WITH MENISCAL REPAIR Left 12/12/2021   Procedure: LEFT KNEE ARTHROSCOPIC DEBRIDEMENT, LATERAL MENISCECTOMY;  Surgeon: Tarry Kos, MD;  Location: [facility] OR;  Service: Orthopedics;  Laterality: Left;    PITUITARY EXCISION  1993   TONSILLECTOMY     Social History   Social History Narrative   Right handed   Immunization History  Administered Date(s) Administered   Influenza Inj Mdck Quad Pf 01/21/2020   Influenza-Unspecified 09/28/2018, 12/08/2022   PFIZER(Purple Top)SARS-COV-2 Vaccination 11/17/2019, 12/13/2019     Objective: Vital Signs: LMP 04/09/2014 (Approximate)    Physical Exam   Musculoskeletal Exam: ***  CDAI Exam: CDAI Score: -- Patient Global: --; Provider Global: -- Swollen: --; Tender: -- Joint Exam 06/14/2023   No joint exam has been documented for this visit   There is currently no information documented on the homunculus. Go to the Rheumatology activity and complete the homunculus joint exam.  Investigation: No additional findings.  Imaging: No results found.  Recent Labs: Lab Results  Component Value Date   WBC 2.6 (L) 01/25/2023   HGB 12.9 01/25/2023   PLT 216 01/25/2023   NA 138 01/25/2023   K 3.9 01/25/2023   CL 104 01/25/2023   CO2 28 01/25/2023   GLUCOSE 90 01/25/2023   BUN 13 01/25/2023   CREATININE 0.71 01/25/2023   BILITOT 0.7 01/25/2023   ALKPHOS 106 01/25/2023   AST 21 01/25/2023   ALT 15 01/25/2023   PROT 7.6 01/25/2023   ALBUMIN 4.2 01/25/2023   CALCIUM 9.5 01/25/2023   GFRAA 102 04/24/2020   QFTBGOLDPLUS NEGATIVE 10/16/2019    Speciality Comments: PLQ Eye Exam 02/26/2020 WNL @ [facility] follow up in 1 year  Procedures:  No procedures performed Allergies: Iron sucrose, Morphine, Venofer [ferric oxide], Azithromycin, and Other   Assessment / Plan:     Visit Diagnoses: Sjogren's syndrome with keratoconjunctivitis sicca (HCC)  High risk medication use  Other drug-induced neutropenia (HCC)  Lymphoid interstitial pneumonia (HCC)  Primary osteoarthritis of both hands  Chronic pain of both hips  Primary osteoarthritis of both knees  Effusion, left knee  Chondromalacia patellae, right  knee  Chondrocalcinosis  Cervical spondylosis without myelopathy  Fibromyalgia  Other fatigue  Prolonged QT interval  History of diabetes insipidus  History of migraine  History of hypothyroidism  History of anxiety  Abnormal SPEP  Orders: No orders of the defined types were placed in this encounter.  No orders of the defined types were placed in this encounter.   Face-to-face time spent with patient was *** minutes. Greater than 50% of time was spent in counseling and coordination of care.  Follow-Up Instructions: No follow-ups on file.   Gearldine Bienenstock, PA-C  Note - This record has been created using Dragon software.  Chart creation errors have been sought, but may not always  have been located. Such creation errors do not reflect on  the standard of medical care.

## 2023-06-14 ENCOUNTER — Ambulatory Visit: Payer: 59 | Admitting: Physician Assistant

## 2023-06-14 DIAGNOSIS — J842 Lymphoid interstitial pneumonia: Secondary | ICD-10-CM

## 2023-06-14 DIAGNOSIS — R778 Other specified abnormalities of plasma proteins: Secondary | ICD-10-CM

## 2023-06-14 DIAGNOSIS — M3501 Sicca syndrome with keratoconjunctivitis: Secondary | ICD-10-CM

## 2023-06-14 DIAGNOSIS — D702 Other drug-induced agranulocytosis: Secondary | ICD-10-CM

## 2023-06-14 DIAGNOSIS — Z8669 Personal history of other diseases of the nervous system and sense organs: Secondary | ICD-10-CM

## 2023-06-14 DIAGNOSIS — M1120 Other chondrocalcinosis, unspecified site: Secondary | ICD-10-CM

## 2023-06-14 DIAGNOSIS — M2241 Chondromalacia patellae, right knee: Secondary | ICD-10-CM

## 2023-06-14 DIAGNOSIS — M797 Fibromyalgia: Secondary | ICD-10-CM

## 2023-06-14 DIAGNOSIS — Z8659 Personal history of other mental and behavioral disorders: Secondary | ICD-10-CM

## 2023-06-14 DIAGNOSIS — M25462 Effusion, left knee: Secondary | ICD-10-CM

## 2023-06-14 DIAGNOSIS — M19041 Primary osteoarthritis, right hand: Secondary | ICD-10-CM

## 2023-06-14 DIAGNOSIS — R5383 Other fatigue: Secondary | ICD-10-CM

## 2023-06-14 DIAGNOSIS — M170 Bilateral primary osteoarthritis of knee: Secondary | ICD-10-CM

## 2023-06-14 DIAGNOSIS — G8929 Other chronic pain: Secondary | ICD-10-CM

## 2023-06-14 DIAGNOSIS — Z8639 Personal history of other endocrine, nutritional and metabolic disease: Secondary | ICD-10-CM

## 2023-06-14 DIAGNOSIS — M47812 Spondylosis without myelopathy or radiculopathy, cervical region: Secondary | ICD-10-CM

## 2023-06-14 DIAGNOSIS — R9431 Abnormal electrocardiogram [ECG] [EKG]: Secondary | ICD-10-CM

## 2023-06-14 DIAGNOSIS — Z79899 Other long term (current) drug therapy: Secondary | ICD-10-CM

## 2023-06-17 NOTE — Progress Notes (Unsigned)
 Office Visit Note  Patient: Stefanie [name]             Date of Birth: Nov 29, 1960           MRN: [id number]             PCP: Margarete Sharps, MD Referring: Margarete Sharps, MD Visit Date: 07/01/2023 Occupation: @GUAROCC @  Subjective:  Trapezius muscle tension and tenderness  History of Present Illness: Stefanie [name] is a 63 y.o. female with history of sjogren's syndrome and osteoarthritis.  Patient remains on  Imuran  50 mg 1 tablet by mouth daily.  She is tolerating Imuran  without any side effects and has not had any recent gaps in therapy.  Patient continues to have chronic sicca symptoms.  She has tried over-the-counter products but experiences a burning sensation when using Biotene products.  She continues to see the dentist every 6 months and ophthalmology on a yearly basis.  She has also remained under the care of pulmonology on a yearly basis.  She was evaluated by Dr. Waylan Haggard in December 2020 for and is planning on having an updated high-resolution chest CT at the end of November/early December 2025. Patient states that her primary concern is trapezius myofascial pain.  She has been experiencing muscle tension and spasms intermittently.  She has been going to [facility] once a month for maintenance massages and requested a new prescription to help to cover these massages.  She has been taking ibuprofen  at bedtime to help with nocturnal pain.  She is having interrupted sleep at night due to discomfort on the lateral aspect of both hips consistent with trochanteric bursitis.  She continues to have intermittent discomfort in the left knee but is not ready to proceed with knee replacement at this time.  She had a left knee joint cortisone injection performed on 01/13/2023 which righted significant relief but her symptoms have gradually started to return.  She remains on Mitigare  0.6 mg 1 capsule by mouth daily.     Activities of Daily Living:  Patient reports morning stiffness for 20-25  minutes.   Patient Reports nocturnal pain.  Difficulty dressing/grooming: Denies Difficulty climbing stairs: Reports Difficulty getting out of chair: Reports Difficulty using hands for taps, buttons, cutlery, and/or writing: Reports  Review of Systems  Constitutional:  Positive for fatigue.  HENT:  Positive for mouth dryness. Negative for mouth sores.   Eyes:  Positive for dryness.  Respiratory:  Negative for shortness of breath.   Cardiovascular:  Positive for chest pain. Negative for palpitations.  Gastrointestinal:  Positive for constipation. Negative for blood in stool and diarrhea.  Endocrine: Negative for increased urination.  Genitourinary:  Negative for involuntary urination.  Musculoskeletal:  Positive for joint pain, gait problem, joint pain, joint swelling, myalgias, muscle weakness, morning stiffness, muscle tenderness and myalgias.  Skin:  Positive for color change and sensitivity to sunlight. Negative for rash and hair loss.  Allergic/Immunologic: Negative for susceptible to infections.  Neurological:  Negative for dizziness and headaches.  Hematological:  Positive for swollen glands.  Psychiatric/Behavioral:  Positive for sleep disturbance. Negative for depressed mood. The patient is not nervous/anxious.     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   Prolonged QT interval 07/28/2016   Primary osteoarthritis of both hands 05/11/2016   Primary osteoarthritis of both knees 05/11/2016   History of hypothyroidism  05/11/2016   History of migraine 05/11/2016   Autoimmune disease (HCC) positive ANA, 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, 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, anticardiolipin antibody and positive rheumatoid factor 04/29/2016   ANA positive 04/29/2016   Rheumatoid factor positive 04/29/2016   Pseudogout 04/29/2016   Chondrocalcinosis 04/29/2016   High risk medication use 04/29/2016   Sjogren's syndrome with keratoconjunctivitis sicca (HCC) 04/29/2016   History of neutropenia 04/29/2016   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   Hyponatremia 12/12/2013   Fever 12/13/2012   Pleuritic chest pain 12/06/2012   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   Anxiety 12/06/2012   ANEMIA-IRON DEFICIENCY 01/08/2010   GASTRIC DILATION, ACUTE 01/08/2010   NAUSEA ALONE 01/08/2010   ABDOMINAL PAIN, LEFT UPPER QUADRANT 01/08/2010    Past Medical History:  Diagnosis Date   Anemia    Arthritis    Asthma    DDD (degenerative disc disease), lumbar    Diabetes insipidus (HCC)    Fibromyalgia    Headache    hx of migraines   Hypothyroidism    Lupus    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   Mitral valve prolapse    pt had echo on 09/04/21   PONV (postoperative nausea and vomiting)    PVC's (premature ventricular contractions)    SLE (systemic lupus erythematosus) (HCC) 12/06/2012   Thyroid  disease     Family History  Problem Relation Age of Onset   Atrial fibrillation Mother    Dementia Mother    Diabetes Father    Heart disease Father    Hyperlipidemia Father    Hypertension Father    Stroke Father    Diabetes Sister    Kidney disease Sister        transplant   Heart disease Sister        open heart surgery    Pancreatic disease Sister        transplant    Cancer Maternal Grandmother    Diabetes Paternal Grandfather    Heart disease Paternal Grandfather    Hyperlipidemia Paternal Grandfather    Hypertension Paternal Actor    Healthy Daughter    Healthy Son    Colon cancer Neg Hx     Esophageal cancer Neg Hx    Rectal cancer Neg Hx    Stomach cancer Neg Hx    Past Surgical History:  Procedure Laterality Date   COLONOSCOPY     KNEE ARTHROSCOPY WITH MENISCAL REPAIR Left 12/12/2021   Procedure: LEFT KNEE ARTHROSCOPIC DEBRIDEMENT, LATERAL MENISCECTOMY;  Surgeon: Wes Hamman, MD;  Location: [facility] OR;  Service: Orthopedics;  Laterality: Left;   PITUITARY EXCISION  1993   TONSILLECTOMY     Social History   Social History Narrative   Right handed   Immunization History  Administered Date(s) Administered   Influenza Inj Mdck Quad Pf 01/21/2020   Influenza-Unspecified 09/28/2018, 12/08/2022   PFIZER(Purple Top)SARS-COV-2 Vaccination 11/17/2019, 12/13/2019     Objective: Vital Signs: BP 111/67 (BP Location: Left Arm, Patient Position: Sitting, Cuff Size: Normal)   Pulse 75   Resp 16   Ht 5\' 6"  (1.676 m)   Wt 145 lb (65.8 kg)   LMP 04/09/2014 (Approximate)   BMI 23.40  kg/m    Physical Exam Vitals and nursing note reviewed.  Constitutional:      Appearance: She is well-developed.  HENT:     Head: Normocephalic and atraumatic.  Eyes:     Conjunctiva/sclera: Conjunctivae normal.  Cardiovascular:     Rate and Rhythm: Normal rate and regular rhythm.     Heart sounds: Normal heart sounds.  Pulmonary:     Effort: Pulmonary effort is normal.     Breath sounds: Normal breath sounds.  Abdominal:     General: Bowel sounds are normal.     Palpations: Abdomen is soft.  Musculoskeletal:     Cervical back: Normal range of motion.  Lymphadenopathy:     Cervical: No cervical adenopathy.  Skin:    General: Skin is warm and dry.     Capillary Refill: Capillary refill takes less than 2 seconds.  Neurological:     Mental Status: She is alert and oriented to person, place, and time.  Psychiatric:        Behavior: Behavior normal.      Musculoskeletal Exam: Generalized hyperalgesia and positive tender points on exam.  C-spine has limited range of motion with  lateral rotation.  Trapezius muscle tension and tenderness noted bilaterally.  Shoulder joints, elbow joints, wrist joints MCPs, PIPs, DIPs have good range of motion with no synovitis.  CMC joint thickening and subluxation bilaterally.  PIP and DIP thickening noted.  Hip joints have good range of motion with no groin pain.  Tenderness over bilateral trochanteric bursa.  Slightly limited extension and valgus deformity of the left knee.  Warmth and swelling of the left knee noted.  Right knee has good range of motion with no warmth or effusion but crepitus was noted.  CDAI Exam: CDAI Score: -- Patient Global: --; Provider Global: -- Swollen: --; Tender: -- Joint Exam 07/01/2023   No joint exam has been documented for this visit   There is currently no information documented on the homunculus. Go to the Rheumatology activity and complete the homunculus joint exam.  Investigation: No additional findings.  Imaging: No results found.  Recent Labs: Lab Results  Component Value Date   WBC 2.6 (L) 01/25/2023   HGB 12.9 01/25/2023   PLT 216 01/25/2023   NA 138 01/25/2023   K 3.9 01/25/2023   CL 104 01/25/2023   CO2 28 01/25/2023   GLUCOSE 90 01/25/2023   BUN 13 01/25/2023   CREATININE 0.71 01/25/2023   BILITOT 0.7 01/25/2023   ALKPHOS 106 01/25/2023   AST 21 01/25/2023   ALT 15 01/25/2023   PROT 7.6 01/25/2023   ALBUMIN 4.2 01/25/2023   CALCIUM 9.5 01/25/2023   GFRAA 102 04/24/2020   QFTBGOLDPLUS NEGATIVE 10/16/2019    Speciality Comments: PLQ Eye Exam 02/26/2020 WNL @ [facility] follow up in 1 year  Procedures:  No procedures performed Allergies: Iron sucrose, Morphine , Venofer [ferric oxide], Azithromycin, and Other      Assessment / Plan:     Visit Diagnoses: Sjogren's syndrome with keratoconjunctivitis sicca (HCC) - Positive ANA, positive Ro, positive La, positive RF sicca symptoms, inflammatory arthritis: She continues to have chronic sicca symptoms and  has tried over-the-counter products for symptomatic relief.  She does not find Biotene products to be effective and actually experiences a burning sensation with use.  Discussed alternatives including XyliMelts or smart mouth rinse. Patient remains on Imuran  50 mg 1 tablet by mouth daily.  She continues to tolerate Imuran  without any side effects and has not  had any recent gaps in therapy. Lab work from 01/13/2023 was reviewed today in the office: Complements within normal limits, double-stranded DNA negative, ESR 38.  Plan to obtain the following lab work today for further evaluation. Discussed the importance of seeing her dentist every 6 months and ophthalmology at least on a yearly basis.  She will remain on Imuran  50 mg 1 tablet by mouth daily.  A refill of Imuran  will be sent to the pharmacy today.  She was advised to notify us  if she develops any new or worsening symptoms.  She will follow-up in the office in 5 months or sooner if needed.  - Plan: CBC with Differential/Platelet, Comprehensive metabolic panel with GFR, Urinalysis, Routine w reflex microscopic, Sedimentation rate, C3 and C4, ANA, Serum protein electrophoresis with reflex, Rheumatoid factor, Sjogrens syndrome-A extractable nuclear antibody, Sjogrens syndrome-B extractable nuclear antibody, Anti-DNA antibody, double-stranded  High risk medication use - Imuran  50 mg 1 tablet by mouth daily-reduced due to neutropenia.  CBC and CMP updated on 01/25/23. Orders for CBC and CMP released today.  Her next lab work will be due in July and every 3 months to monitor for drug toxicity. No recent or recurrent infections.   - Plan: CBC with Differential/Platelet, Comprehensive metabolic panel with GFR  Other drug-induced neutropenia (HCC) -White blood cell count was 2.6--stable and absolute lymphocytes were low at 0.4 on 01/25/2023.  CBC with diff updated today.  Patient will remain on low-dose Imuran  50 mg 1 tablet daily with close lab monitoring.   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 Waylan Haggard, on 08/04/2022 PFTs had improved and diffusion capacity.  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  Reviewed Dr. Isabel Many office visit note from 02/16/2023: Recommended continuing Imuran  and having an updated high-resolution chest CT in 1 year--order for high-resolution chest CT is in place.  Primary osteoarthritis of both hands: She has PIP and DIP thickening consistent with osteoarthritis of both hands.  No active inflammation noted on examination today.  Chronic pain of both hips - She was recently evaluated by Dr. Christiane Cowing.  She has tenderness over bilateral trochanteric bursa.  She has been experiencing interrupted sleep at night due to nocturnal pain secondary to trochanter bursitis of both hips.  A referral to integrative therapies will be placed once the patient is agreeable.  Primary osteoarthritis of both knees: She has limited extension and valgus deformity of the left knee.  Crepitus noted in both knees with range of motion.  She is not yet ready to proceed with a left knee replacement.  She had a left knee joint cortisone injection performed on 01/13/2019 for which provided significant relief but her symptoms have gradually started to return.  She will notify us  when and if she would like to return for repeat cortisone injection in the future.  Effusion, left knee - MRI of the left knee 09/27/2021:She underwent left knee arthroscopic surgery on 12/12/2021 performed by Dr. Christiane Cowing. She had a left knee joint cortisone injection performed on 01/13/2023 which provided significant relief but her symptoms have gradually been returning.  She is not yet ready to proceed with a repeat cortisone injection at this time but will notify us  if her symptoms persist or worsen.  Chondromalacia patellae, right knee:  Crepitus but no warmth or effusion noted.   Chondrocalcinosis - She remains on Mitigare  0.6 mg 1 capsule by mouth daily-a refill will  be sent to the pharmacy today.  Cervical spondylosis without myelopathy: C-spine has limited range of motion with lateral rotation. She has been going to [facility] once a month for maintenance massages--a prescription will be provided to help cover for maintenance massages which she finds to be beneficial. Also offered referral to integrative therapies so she will call us  back to notify us  when and if she would like referral placed.  Fibromyalgia: She has generalized hyperalgesia and positive tender points on exam.  She presents today with trapezius muscle tension and tenderness bilaterally.  She has been experiencing muscle spasms and a burning sensation intermittently.  She has been going to [facility] for massages on a monthly basis which right temporary relief--a prescription for maintenance massages will be mailed to the patient today.  Discussed that she may benefit from referral to integrative therapies-she will notify us  when and if she would like referral placed. To help alleviate her current symptoms a prescription for methocarbamol  500 mg 1 tablet daily as needed for muscle spasms was sent to the pharmacy today.  Other fatigue: Chronic, stable.   Other medical conditions are listed as follows:  Prolonged QT interval  History of diabetes insipidus  History of migraine  History of hypothyroidism  History of anxiety  Abnormal SPEP  Plantar fasciitis of left foot  Orders: Orders Placed This Encounter  Procedures   CBC with Differential/Platelet   Comprehensive metabolic panel with GFR   Urinalysis, Routine w reflex microscopic   Sedimentation rate   C3 and C4   ANA   Serum protein electrophoresis with reflex   Rheumatoid factor   Sjogrens syndrome-A extractable nuclear antibody   Sjogrens syndrome-B extractable nuclear antibody    Anti-DNA antibody, double-stranded   Meds ordered this encounter  Medications   azaTHIOprine  (IMURAN ) 50 MG tablet    Sig: Take 1 tablet (50 mg total) by mouth daily.    Dispense:  90 tablet    Refill:  0   Colchicine  (MITIGARE ) 0.6 MG CAPS    Sig: Take 1 capsule (0.6 mg total) by mouth daily.    Dispense:  90 capsule    Refill:  0   methocarbamol  (ROBAXIN ) 500 MG tablet    Sig: Take 1 tablet (500 mg total) by mouth daily as needed for muscle spasms.    Dispense:  14 tablet    Refill:  0     Follow-Up Instructions: Return in about 5 months (around 12/01/2023) for Sjogren's syndrome.   Romayne Clubs, PA-C  Note - This record has been created using Dragon software.  Chart creation errors have been sought, but may not always  have been located. Such creation errors do not reflect on  the standard of medical care.

## 2023-07-01 ENCOUNTER — Ambulatory Visit: Attending: Physician Assistant | Admitting: Physician Assistant

## 2023-07-01 ENCOUNTER — Encounter: Payer: Self-pay | Admitting: Physician Assistant

## 2023-07-01 VITALS — BP 111/67 | HR 75 | Resp 16 | Ht 66.0 in | Wt 145.0 lb

## 2023-07-01 DIAGNOSIS — M19042 Primary osteoarthritis, left hand: Secondary | ICD-10-CM

## 2023-07-01 DIAGNOSIS — M19041 Primary osteoarthritis, right hand: Secondary | ICD-10-CM | POA: Diagnosis not present

## 2023-07-01 DIAGNOSIS — J842 Lymphoid interstitial pneumonia: Secondary | ICD-10-CM | POA: Diagnosis not present

## 2023-07-01 DIAGNOSIS — M1120 Other chondrocalcinosis, unspecified site: Secondary | ICD-10-CM

## 2023-07-01 DIAGNOSIS — M3501 Sicca syndrome with keratoconjunctivitis: Secondary | ICD-10-CM | POA: Diagnosis not present

## 2023-07-01 DIAGNOSIS — R9431 Abnormal electrocardiogram [ECG] [EKG]: Secondary | ICD-10-CM

## 2023-07-01 DIAGNOSIS — M25551 Pain in right hip: Secondary | ICD-10-CM

## 2023-07-01 DIAGNOSIS — M25462 Effusion, left knee: Secondary | ICD-10-CM | POA: Diagnosis not present

## 2023-07-01 DIAGNOSIS — Z79899 Other long term (current) drug therapy: Secondary | ICD-10-CM | POA: Diagnosis not present

## 2023-07-01 DIAGNOSIS — R778 Other specified abnormalities of plasma proteins: Secondary | ICD-10-CM

## 2023-07-01 DIAGNOSIS — M47812 Spondylosis without myelopathy or radiculopathy, cervical region: Secondary | ICD-10-CM | POA: Diagnosis not present

## 2023-07-01 DIAGNOSIS — R5383 Other fatigue: Secondary | ICD-10-CM

## 2023-07-01 DIAGNOSIS — M170 Bilateral primary osteoarthritis of knee: Secondary | ICD-10-CM | POA: Diagnosis not present

## 2023-07-01 DIAGNOSIS — M722 Plantar fascial fibromatosis: Secondary | ICD-10-CM

## 2023-07-01 DIAGNOSIS — M25552 Pain in left hip: Secondary | ICD-10-CM

## 2023-07-01 DIAGNOSIS — D702 Other drug-induced agranulocytosis: Secondary | ICD-10-CM

## 2023-07-01 DIAGNOSIS — Z8659 Personal history of other mental and behavioral disorders: Secondary | ICD-10-CM

## 2023-07-01 DIAGNOSIS — M797 Fibromyalgia: Secondary | ICD-10-CM

## 2023-07-01 DIAGNOSIS — M2241 Chondromalacia patellae, right knee: Secondary | ICD-10-CM | POA: Diagnosis not present

## 2023-07-01 DIAGNOSIS — Z8639 Personal history of other endocrine, nutritional and metabolic disease: Secondary | ICD-10-CM

## 2023-07-01 DIAGNOSIS — Z8669 Personal history of other diseases of the nervous system and sense organs: Secondary | ICD-10-CM

## 2023-07-01 DIAGNOSIS — G8929 Other chronic pain: Secondary | ICD-10-CM

## 2023-07-01 MED ORDER — METHOCARBAMOL 500 MG PO TABS: 500.0000 mg | ORAL_TABLET | Freq: Every day | ORAL | 0 refills | Status: DC | PRN

## 2023-07-01 MED ORDER — COLCHICINE 0.6 MG PO CAPS: 1.0000 | ORAL_CAPSULE | Freq: Every day | ORAL | 0 refills | Status: DC

## 2023-07-01 MED ORDER — AZATHIOPRINE 50 MG PO TABS: 50.0000 mg | ORAL_TABLET | Freq: Every day | ORAL | 0 refills | Status: DC

## 2023-07-02 ENCOUNTER — Telehealth: Payer: Self-pay | Admitting: *Deleted

## 2023-07-02 DIAGNOSIS — Z79899 Other long term (current) drug therapy: Secondary | ICD-10-CM

## 2023-07-02 NOTE — Progress Notes (Signed)
 WBC count remains low-2.7. absolute lymphocytes are remain low. Rest of CBC WNL.  Recheck CBC with diff in 1 month.   ESR remains borderline elevated but stable.   UA normal CMP WNL RF negative  Complements WNL

## 2023-07-02 NOTE — Telephone Encounter (Signed)
-----   Message from Romayne Clubs sent at 07/02/2023  7:16 AM EDT ----- WBC count remains low-2.7. absolute lymphocytes are remain low. Rest of CBC WNL.  Recheck CBC with diff in 1 month.   ESR remains borderline elevated but stable.   UA normal CMP WNL RF negative  Complements WNL

## 2023-07-02 NOTE — Progress Notes (Signed)
 Ro antibody remains positive. La antibody negative.  dsDNA negative.

## 2023-07-03 LAB — C3 AND C4
C3 Complement: 149 mg/dL (ref 83–193)
C4 Complement: 27 mg/dL (ref 15–57)

## 2023-07-03 LAB — URINALYSIS, ROUTINE W REFLEX MICROSCOPIC
Bilirubin Urine: NEGATIVE
Glucose, UA: NEGATIVE
Hgb urine dipstick: NEGATIVE
Ketones, ur: NEGATIVE
Leukocytes,Ua: NEGATIVE
Nitrite: NEGATIVE
Protein, ur: NEGATIVE
Specific Gravity, Urine: 1.017 (ref 1.001–1.035)
pH: 6.5 (ref 5.0–8.0)

## 2023-07-03 LAB — CBC WITH DIFFERENTIAL/PLATELET
Absolute Lymphocytes: 419 {cells}/uL — ABNORMAL LOW (ref 850–3900)
Absolute Monocytes: 489 {cells}/uL (ref 200–950)
Basophils Absolute: 22 {cells}/uL (ref 0–200)
Basophils Relative: 0.8 %
Eosinophils Absolute: 70 {cells}/uL (ref 15–500)
Eosinophils Relative: 2.6 %
HCT: 37.8 % (ref 35.0–45.0)
Hemoglobin: 12.7 g/dL (ref 11.7–15.5)
MCH: 30 pg (ref 27.0–33.0)
MCHC: 33.6 g/dL (ref 32.0–36.0)
MCV: 89.2 fL (ref 80.0–100.0)
MPV: 11 fL (ref 7.5–12.5)
Monocytes Relative: 18.1 %
Neutro Abs: 1701 {cells}/uL (ref 1500–7800)
Neutrophils Relative %: 63 %
Platelets: 222 10*3/uL (ref 140–400)
RBC: 4.24 10*6/uL (ref 3.80–5.10)
RDW: 13.4 % (ref 11.0–15.0)
Total Lymphocyte: 15.5 %
WBC: 2.7 10*3/uL — ABNORMAL LOW (ref 3.8–10.8)

## 2023-07-03 LAB — SJOGRENS SYNDROME-A EXTRACTABLE NUCLEAR ANTIBODY: SSA (Ro) (ENA) Antibody, IgG: >8 AI — AB

## 2023-07-03 LAB — PROTEIN ELECTROPHORESIS, SERUM, WITH REFLEX
Albumin ELP: 4.4 g/dL (ref 3.8–4.8)
Alpha 1: 0.3 g/dL (ref 0.2–0.3)
Alpha 2: 0.8 g/dL (ref 0.5–0.9)
Beta 2: 0.4 g/dL (ref 0.2–0.5)
Beta Globulin: 0.5 g/dL (ref 0.4–0.6)
Gamma Globulin: 1.4 g/dL (ref 0.8–1.7)
Total Protein: 7.7 g/dL (ref 6.1–8.1)

## 2023-07-03 LAB — ANTI-DNA ANTIBODY, DOUBLE-STRANDED: ds DNA Ab: 1 [IU]/mL

## 2023-07-03 LAB — COMPREHENSIVE METABOLIC PANEL WITH GFR
AG Ratio: 1.3 (calc) (ref 1.0–2.5)
ALT: 16 U/L (ref 6–29)
AST: 24 U/L (ref 10–35)
Albumin: 4.3 g/dL (ref 3.6–5.1)
Alkaline phosphatase (APISO): 117 U/L (ref 37–153)
BUN: 18 mg/dL (ref 7–25)
CO2: 29 mmol/L (ref 20–32)
Calcium: 9.6 mg/dL (ref 8.6–10.4)
Chloride: 102 mmol/L (ref 98–110)
Creat: 0.72 mg/dL (ref 0.50–1.05)
Globulin: 3.2 g/dL (ref 1.9–3.7)
Glucose, Bld: 85 mg/dL (ref 65–99)
Potassium: 4.1 mmol/L (ref 3.5–5.3)
Sodium: 139 mmol/L (ref 135–146)
Total Bilirubin: 0.5 mg/dL (ref 0.2–1.2)
Total Protein: 7.5 g/dL (ref 6.1–8.1)
eGFR: 94 mL/min/{1.73_m2} (ref >=60)

## 2023-07-03 LAB — RHEUMATOID FACTOR: Rheumatoid fact SerPl-aCnc: 11 [IU]/mL (ref <14)

## 2023-07-03 LAB — SEDIMENTATION RATE: Sed Rate: 33 mm/h — ABNORMAL HIGH (ref 0–30)

## 2023-07-03 LAB — ANTI-NUCLEAR AB-TITER (ANA TITER): ANA Titer 1: 1:320 {titer} — ABNORMAL HIGH

## 2023-07-03 LAB — SJOGRENS SYNDROME-B EXTRACTABLE NUCLEAR ANTIBODY: SSB (La) (ENA) Antibody, IgG: <1 AI

## 2023-07-03 LAB — ANA: Anti Nuclear Antibody (ANA): POSITIVE — AB

## 2023-07-05 NOTE — Progress Notes (Signed)
ANA remains positive.   SPEP did not reveal any abnormal protein bands.

## 2023-08-12 DIAGNOSIS — E785 Hyperlipidemia, unspecified: Secondary | ICD-10-CM | POA: Diagnosis not present

## 2023-08-12 DIAGNOSIS — Z1212 Encounter for screening for malignant neoplasm of rectum: Secondary | ICD-10-CM | POA: Diagnosis not present

## 2023-08-12 DIAGNOSIS — Z79899 Other long term (current) drug therapy: Secondary | ICD-10-CM | POA: Diagnosis not present

## 2023-08-12 DIAGNOSIS — E039 Hypothyroidism, unspecified: Secondary | ICD-10-CM | POA: Diagnosis not present

## 2023-08-12 DIAGNOSIS — D630 Anemia in neoplastic disease: Secondary | ICD-10-CM | POA: Diagnosis not present

## 2023-08-12 DIAGNOSIS — M8580 Other specified disorders of bone density and structure, unspecified site: Secondary | ICD-10-CM | POA: Diagnosis not present

## 2023-08-17 DIAGNOSIS — R82998 Other abnormal findings in urine: Secondary | ICD-10-CM | POA: Diagnosis not present

## 2023-08-17 DIAGNOSIS — M3501 Sicca syndrome with keratoconjunctivitis: Secondary | ICD-10-CM | POA: Diagnosis not present

## 2023-08-17 DIAGNOSIS — M1120 Other chondrocalcinosis, unspecified site: Secondary | ICD-10-CM | POA: Diagnosis not present

## 2023-08-17 DIAGNOSIS — D72819 Decreased white blood cell count, unspecified: Secondary | ICD-10-CM | POA: Diagnosis not present

## 2023-08-17 DIAGNOSIS — M3210 Systemic lupus erythematosus, organ or system involvement unspecified: Secondary | ICD-10-CM | POA: Diagnosis not present

## 2023-08-17 DIAGNOSIS — E785 Hyperlipidemia, unspecified: Secondary | ICD-10-CM | POA: Diagnosis not present

## 2023-08-17 DIAGNOSIS — Z0000 Encounter for general adult medical examination without abnormal findings: Secondary | ICD-10-CM | POA: Diagnosis not present

## 2023-08-17 DIAGNOSIS — D899 Disorder involving the immune mechanism, unspecified: Secondary | ICD-10-CM | POA: Diagnosis not present

## 2023-08-17 DIAGNOSIS — Z1331 Encounter for screening for depression: Secondary | ICD-10-CM | POA: Diagnosis not present

## 2023-08-17 DIAGNOSIS — E039 Hypothyroidism, unspecified: Secondary | ICD-10-CM | POA: Diagnosis not present

## 2023-08-17 DIAGNOSIS — I340 Nonrheumatic mitral (valve) insufficiency: Secondary | ICD-10-CM | POA: Diagnosis not present

## 2023-08-17 DIAGNOSIS — E871 Hypo-osmolality and hyponatremia: Secondary | ICD-10-CM | POA: Diagnosis not present

## 2023-08-17 DIAGNOSIS — I341 Nonrheumatic mitral (valve) prolapse: Secondary | ICD-10-CM | POA: Diagnosis not present

## 2023-09-16 DIAGNOSIS — R42 Dizziness and giddiness: Secondary | ICD-10-CM | POA: Diagnosis not present

## 2023-09-16 DIAGNOSIS — R531 Weakness: Secondary | ICD-10-CM | POA: Diagnosis not present

## 2023-09-16 DIAGNOSIS — M3210 Systemic lupus erythematosus, organ or system involvement unspecified: Secondary | ICD-10-CM | POA: Diagnosis not present

## 2023-09-16 DIAGNOSIS — D72819 Decreased white blood cell count, unspecified: Secondary | ICD-10-CM | POA: Diagnosis not present

## 2023-09-16 DIAGNOSIS — E039 Hypothyroidism, unspecified: Secondary | ICD-10-CM | POA: Diagnosis not present

## 2023-09-20 ENCOUNTER — Telehealth: Payer: Self-pay | Admitting: Internal Medicine

## 2023-09-20 NOTE — Telephone Encounter (Signed)
Scheduled appointments with the patient

## 2023-09-27 ENCOUNTER — Telehealth: Payer: Self-pay | Admitting: Rheumatology

## 2023-09-27 DIAGNOSIS — M8589 Other specified disorders of bone density and structure, multiple sites: Secondary | ICD-10-CM | POA: Diagnosis not present

## 2023-09-27 LAB — HM DEXA SCAN: HM Dexa Scan: NORMAL

## 2023-09-27 NOTE — Telephone Encounter (Signed)
 Patient called stating her handicap placard that she uses for work will be expiring at the end of July 2025. Patient states she has a long walk from employee parking to her building which causes increased left leg pain and swelling.  Patient is requesting to renew her placard for another 6 months.

## 2023-09-28 NOTE — Telephone Encounter (Signed)
 Reached out to patient to advise we will renew her handicap placard. Patient advised we will renew for a permanent placard. Placed at front desk for patient pick up.

## 2023-09-28 NOTE — Telephone Encounter (Signed)
 Ok to provide permanent placard paperwork

## 2023-10-03 ENCOUNTER — Other Ambulatory Visit: Payer: Self-pay | Admitting: Physician Assistant

## 2023-10-03 DIAGNOSIS — Z79899 Other long term (current) drug therapy: Secondary | ICD-10-CM

## 2023-10-04 ENCOUNTER — Other Ambulatory Visit: Payer: Self-pay | Admitting: *Deleted

## 2023-10-04 DIAGNOSIS — R778 Other specified abnormalities of plasma proteins: Secondary | ICD-10-CM

## 2023-10-04 DIAGNOSIS — Z79899 Other long term (current) drug therapy: Secondary | ICD-10-CM

## 2023-10-04 DIAGNOSIS — R5383 Other fatigue: Secondary | ICD-10-CM

## 2023-10-04 DIAGNOSIS — J842 Lymphoid interstitial pneumonia: Secondary | ICD-10-CM

## 2023-10-04 DIAGNOSIS — D702 Other drug-induced agranulocytosis: Secondary | ICD-10-CM

## 2023-10-04 DIAGNOSIS — M3501 Sicca syndrome with keratoconjunctivitis: Secondary | ICD-10-CM

## 2023-10-04 DIAGNOSIS — R252 Cramp and spasm: Secondary | ICD-10-CM

## 2023-10-04 NOTE — Telephone Encounter (Signed)
 Last Fill: 07/01/2023  Labs: 07/01/2023 WBC count remains low-2.7. absolute lymphocytes are remain low. Rest of CBC WNL.  Recheck CBC with diff in 1 month.   ESR remains borderline elevated but stable.   UA normal  CMP WNL  RF negative  Complements WNL  Ro antibody remains positive.  La antibody negative.  dsDNA negative  ANA remains positive.  SPEP did not reveal any abnormal protein bands.   Next Visit: 11/29/2023  Last Visit: 07/01/2023  DX: Sjogren's syndrome with keratoconjunctivitis sicca (HCC)   Current Dose per office note 07/01/2023: Imuran  50 mg 1 tablet by mouth daily-reduced due to neutropenia.   Contacted the patient and she states she can come by the office today to get lab work for her medication.   Okay to refill Imuran ?

## 2023-10-06 LAB — COMPREHENSIVE METABOLIC PANEL WITH GFR
AG Ratio: 1.5 (calc) (ref 1.0–2.5)
ALT: 16 U/L (ref 6–29)
AST: 25 U/L (ref 10–35)
Albumin: 4.5 g/dL (ref 3.6–5.1)
Alkaline phosphatase (APISO): 107 U/L (ref 37–153)
BUN: 15 mg/dL (ref 7–25)
CO2: 28 mmol/L (ref 20–32)
Calcium: 9.5 mg/dL (ref 8.6–10.4)
Chloride: 101 mmol/L (ref 98–110)
Creat: 0.74 mg/dL (ref 0.50–1.05)
Globulin: 3 g/dL (ref 1.9–3.7)
Glucose, Bld: 84 mg/dL (ref 65–99)
Potassium: 4.4 mmol/L (ref 3.5–5.3)
Sodium: 137 mmol/L (ref 135–146)
Total Bilirubin: 0.6 mg/dL (ref 0.2–1.2)
Total Protein: 7.5 g/dL (ref 6.1–8.1)
eGFR: 91 mL/min/1.73m2 (ref >=60)

## 2023-10-06 LAB — ANTI-NUCLEAR AB-TITER (ANA TITER)
ANA TITER: 1:40 {titer} — ABNORMAL HIGH
ANA Titer 1: 1:640 {titer} — ABNORMAL HIGH

## 2023-10-06 LAB — CBC WITH DIFFERENTIAL/PLATELET
Absolute Lymphocytes: 370 {cells}/uL — ABNORMAL LOW (ref 850–3900)
Absolute Monocytes: 479 {cells}/uL (ref 200–950)
Basophils Absolute: 20 {cells}/uL (ref 0–200)
Basophils Relative: 0.7 %
Eosinophils Absolute: 59 {cells}/uL (ref 15–500)
Eosinophils Relative: 2.1 %
HCT: 39.2 % (ref 35.0–45.0)
Hemoglobin: 12.6 g/dL (ref 11.7–15.5)
MCH: 29.7 pg (ref 27.0–33.0)
MCHC: 32.1 g/dL (ref 32.0–36.0)
MCV: 92.5 fL (ref 80.0–100.0)
MPV: 11.1 fL (ref 7.5–12.5)
Monocytes Relative: 17.1 %
Neutro Abs: 1873 {cells}/uL (ref 1500–7800)
Neutrophils Relative %: 66.9 %
Platelets: 230 Thousand/uL (ref 140–400)
RBC: 4.24 Million/uL (ref 3.80–5.10)
RDW: 13.2 % (ref 11.0–15.0)
Total Lymphocyte: 13.2 %
WBC: 2.8 Thousand/uL — ABNORMAL LOW (ref 3.8–10.8)

## 2023-10-06 LAB — URINALYSIS, ROUTINE W REFLEX MICROSCOPIC
Bilirubin Urine: NEGATIVE
Glucose, UA: NEGATIVE
Hgb urine dipstick: NEGATIVE
Ketones, ur: NEGATIVE
Leukocytes,Ua: NEGATIVE
Nitrite: NEGATIVE
Protein, ur: NEGATIVE
Specific Gravity, Urine: 1.005 (ref 1.001–1.035)
pH: 7 (ref 5.0–8.0)

## 2023-10-06 LAB — PROTEIN ELECTROPHORESIS, SERUM, WITH REFLEX
Albumin ELP: 4.3 g/dL (ref 3.8–4.8)
Alpha 1: 0.3 g/dL (ref 0.2–0.3)
Alpha 2: 0.8 g/dL (ref 0.5–0.9)
Beta 2: 0.4 g/dL (ref 0.2–0.5)
Beta Globulin: 0.5 g/dL (ref 0.4–0.6)
Gamma Globulin: 1.4 g/dL (ref 0.8–1.7)
Total Protein: 7.7 g/dL (ref 6.1–8.1)

## 2023-10-06 LAB — SEDIMENTATION RATE: Sed Rate: 34 mm/h — ABNORMAL HIGH (ref 0–30)

## 2023-10-06 LAB — C3 AND C4
C3 Complement: 154 mg/dL (ref 83–193)
C4 Complement: 28 mg/dL (ref 15–57)

## 2023-10-06 LAB — RHEUMATOID FACTOR: Rheumatoid fact SerPl-aCnc: 11 [IU]/mL (ref <14)

## 2023-10-06 LAB — ANA: Anti Nuclear Antibody (ANA): POSITIVE — AB

## 2023-10-06 LAB — ANTI-DNA ANTIBODY, DOUBLE-STRANDED: ds DNA Ab: 1 [IU]/mL

## 2023-10-07 ENCOUNTER — Ambulatory Visit: Payer: Self-pay | Admitting: Physician Assistant

## 2023-10-12 DIAGNOSIS — M25471 Effusion, right ankle: Secondary | ICD-10-CM | POA: Diagnosis not present

## 2023-10-12 DIAGNOSIS — S99911A Unspecified injury of right ankle, initial encounter: Secondary | ICD-10-CM | POA: Diagnosis not present

## 2023-10-12 NOTE — Progress Notes (Unsigned)
 Office Visit Note  Patient: Stefanie [name]             Date of Birth: May 19, 1960           MRN: [id number]             PCP: [name], MD Referring: [name], MD Visit Date: 10/26/2023 Occupation: @GUAROCC @  Subjective:  Left knee pain   History of Present Illness: Rowan Pollman is a 63 y.o. female with history of sjogren's syndrome and osteoarthritis. Patient remains on Imuran  50 mg 1 tablet by mouth daily.  She presents today with ongoing pain in the left knee joint.  She had a left knee joint cortisone injection performed on 01/13/2023 but her symptoms started to recur about 2 months ago.  She requested a repeat cortisone injection today. Patient presents today to discuss recent DEXA results.  Patient states that her PCP Dr. Onita has started her on Fosamax.  She has been taking Fosamax 70 mg once weekly for the past 2 weeks.  She is tolerating Fosamax without any side effects.  Patient states that she discontinued ibuprofen  prior to initiating Fosamax due to concern for gastritis/esophagitis while taking both medications.  She has been taking Tylenol  as needed for symptomatic relief. Patient continues to have chronic sicca symptoms which have been unchanged.  She uses refresh eyedrops as recommended and continues to follow-up with ophthalmology.  She has dentures.   Patient plans on scheduling a follow-up visit with Dr. Theophilus. Patient states that she has established care with Dr. Gatha for further evaluation of leukopenia.  She will be undergoing a bone marrow biopsy for further evaluation.  Activities of Daily Living:  Patient reports morning stiffness for 20 minutes.   Patient Reports nocturnal pain.  Difficulty dressing/grooming: Reports Difficulty climbing stairs: Reports Difficulty getting out of chair: Reports Difficulty using hands for taps, buttons, cutlery, and/or writing: Reports  Review of Systems  Constitutional:  Positive for fatigue.  HENT:   Positive for mouth sores and mouth dryness.   Eyes:  Positive for dryness.  Respiratory:  Positive for shortness of breath.   Cardiovascular:  Negative for chest pain and palpitations.  Gastrointestinal:  Negative for blood in stool, constipation and diarrhea.  Endocrine: Positive for increased urination.  Genitourinary:  Positive for involuntary urination.  Musculoskeletal:  Positive for joint pain, joint pain, joint swelling, myalgias, muscle weakness, morning stiffness, muscle tenderness and myalgias. Negative for gait problem.  Skin:  Positive for rash, hair loss and sensitivity to sunlight. Negative for color change.  Allergic/Immunologic: Positive for susceptible to infections.  Neurological:  Positive for headaches. Negative for dizziness.  Hematological:  Negative for swollen glands.  Psychiatric/Behavioral:  Positive for sleep disturbance. Negative for depressed mood. The patient is not nervous/anxious.     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   Prolonged QT interval 07/28/2016   Primary osteoarthritis of both hands 05/11/2016   Primary osteoarthritis of both knees 05/11/2016   History of hypothyroidism 05/11/2016   History of migraine 05/11/2016  Autoimmune disease (HCC) positive ANA, 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, 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, anticardiolipin antibody and positive rheumatoid factor 04/29/2016   ANA positive 04/29/2016   Rheumatoid factor positive 04/29/2016   Pseudogout 04/29/2016   Chondrocalcinosis 04/29/2016   High risk  medication use 04/29/2016   Sjogren's syndrome with keratoconjunctivitis sicca (HCC) 04/29/2016   History of neutropenia 04/29/2016   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   Hyponatremia 12/12/2013   Fever 12/13/2012   Pleuritic chest pain 12/06/2012   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   Anxiety 12/06/2012   ANEMIA-IRON DEFICIENCY 01/08/2010   GASTRIC DILATION, ACUTE 01/08/2010   NAUSEA ALONE 01/08/2010   ABDOMINAL PAIN, LEFT UPPER QUADRANT 01/08/2010    Past Medical History:  Diagnosis Date   Anemia    Arthritis    Asthma    DDD (degenerative disc disease), lumbar    Diabetes insipidus (HCC)    Fibromyalgia    Headache    hx of migraines   Hypothyroidism    Lupus    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   Mitral valve prolapse    pt had echo on 09/04/21   PONV (postoperative nausea and vomiting)    PVC's (premature ventricular contractions)    SLE (systemic lupus erythematosus) (HCC) 12/06/2012   Thyroid  disease     Family History  Problem Relation Age of Onset   Atrial fibrillation Mother    Dementia Mother    Diabetes Father    Heart disease Father    Hyperlipidemia Father    Hypertension Father    Stroke Father    Diabetes Sister    Kidney disease Sister        transplant   Heart disease Sister        open heart surgery    Pancreatic disease Sister        transplant    Cancer Maternal Grandmother    Diabetes Paternal Grandfather    Heart disease Paternal Grandfather    Hyperlipidemia Paternal Grandfather    Hypertension Paternal Actor    Healthy Daughter    Healthy Son    Colon cancer Neg Hx    Esophageal cancer Neg Hx    Rectal cancer Neg Hx    Stomach cancer Neg Hx    Past Surgical History:  Procedure Laterality Date   COLONOSCOPY     KNEE ARTHROSCOPY WITH MENISCAL REPAIR Left 12/12/2021   Procedure: LEFT KNEE ARTHROSCOPIC DEBRIDEMENT, LATERAL MENISCECTOMY;  Surgeon: Jerri Kay HERO, MD;   Location: [facility] OR;  Service: Orthopedics;  Laterality: Left;   PITUITARY EXCISION  1993   TONSILLECTOMY     Social History   Social History Narrative   Right handed   Immunization History  Administered Date(s) Administered   Influenza Inj Mdck Quad Pf 01/21/2020   Influenza-Unspecified 09/28/2018, 12/08/2022   PFIZER(Purple Top)SARS-COV-2 Vaccination 11/17/2019, 12/13/2019     Objective: Vital Signs: BP 96/60 (BP Location: Left Arm, Patient Position: Sitting, Cuff Size: Normal)   Pulse 75   Resp 14   Ht 5' 6 (1.676 m)   Wt 147 lb (66.7 kg)   LMP 04/09/2014 (Approximate)   BMI 23.73 kg/m    Physical Exam Vitals and nursing note reviewed.  Constitutional:      Appearance: She is well-developed.  HENT:     Head: Normocephalic and atraumatic.  Eyes:     Conjunctiva/sclera: Conjunctivae normal.  Cardiovascular:     Rate and Rhythm: Normal rate and regular rhythm.     Heart  sounds: Murmur heard.  Pulmonary:     Effort: Pulmonary effort is normal.     Breath sounds: Normal breath sounds.  Abdominal:     General: Bowel sounds are normal.     Palpations: Abdomen is soft.  Musculoskeletal:     Cervical back: Normal range of motion.  Lymphadenopathy:     Cervical: No cervical adenopathy.  Skin:    General: Skin is warm and dry.     Capillary Refill: Capillary refill takes less than 2 seconds.  Neurological:     Mental Status: She is alert and oriented to person, place, and time.  Psychiatric:        Behavior: Behavior normal.      Musculoskeletal Exam: C-spine has limited range of motion with lateral rotation.  Trapezius muscle tension bilaterally.  Shoulder joints, elbow joints, wrist joints, MCPs, PIPs, DIPs have good range of motion with no synovitis.  CMC joint prominence and thickening noted bilaterally.  Subluxation bilaterally.  PIP and DIP thickening.  Hip joints have good range of motion with no groin pain.  Slightly limited extension and valgus deformity of the  left knee.  Swelling of the left knee noted. Discomfort with range of motion of the right knee.  Some swelling of the right ankle.  CDAI Exam: CDAI Score: -- Patient Global: --; Provider Global: -- Swollen: --; Tender: -- Joint Exam 10/26/2023   No joint exam has been documented for this visit   There is currently no information documented on the homunculus. Go to the Rheumatology activity and complete the homunculus joint exam.  Investigation: No additional findings.  Imaging: No results found.  Recent Labs: Lab Results  Component Value Date   WBC 2.5 (L) 10/18/2023   HGB 11.7 (L) 10/18/2023   PLT 214 10/18/2023   NA 139 10/18/2023   K 3.9 10/18/2023   CL 104 10/18/2023   CO2 29 10/18/2023   GLUCOSE 92 10/18/2023   BUN 15 10/18/2023   CREATININE 0.67 10/18/2023   BILITOT 0.5 10/18/2023   ALKPHOS 104 10/18/2023   AST 25 10/18/2023   ALT 17 10/18/2023   PROT 7.5 10/18/2023   ALBUMIN 4.3 10/18/2023   CALCIUM 9.5 10/18/2023   GFRAA 102 04/24/2020   QFTBGOLDPLUS NEGATIVE 10/16/2019    Speciality Comments: PLQ Eye Exam 02/26/2020 WNL @ [facility] follow up in 1 year  Procedures:  Large Joint Inj: L knee on 10/26/2023 2:56 PM Indications: pain Details: 27 G 1.5 in needle, medial approach  Arthrogram: No  Medications: 1.5 mL lidocaine  1 %; 40 mg triamcinolone  acetonide 40 MG/ML Aspirate: 0 mL Outcome: tolerated well, no immediate complications Procedure, treatment alternatives, risks and benefits explained, specific risks discussed. Consent was given by the patient. Immediately prior to procedure a time out was called to verify the correct patient, procedure, equipment, support staff and site/side marked as required. Patient was prepped and draped in the usual sterile fashion.     Allergies: Iron sucrose, Morphine , Venofer [ferric oxide], Azithromycin, and Other      Assessment / Plan:     Visit Diagnoses: Sjogren's syndrome with  keratoconjunctivitis sicca (HCC) - Positive ANA, positive Ro, positive La, positive RF sicca symptoms, inflammatory arthritis: Patient continues to have chronic sicca symptoms.  She drinks water throughout the day and has been using refresh eyedrops for symptomatic relief.  She continues to see ophthalmology on a regular basis.  She remains on Imuran  50 mg 1 tablet by mouth daily.  She continues to have  persistent pain in the left knee.  Inflammation and synovial thickening of the left knee joint was noted today--- a cortisone injection was performed as requested. No other joint inflammation noted. Discussed the increased risk for developing lymphoma in patients with Sjogren's syndrome.  SPEP unremarkable on 10/04/2023.  Patient is under the care of Dr. Gatha and will be undergoing further evaluation due to longstanding history of leukopenia.  She will be undergoing a bone marrow biopsy. No new or worsening pulmonary symptoms.  Lungs were clear to auscultation today.  Patient plans on scheduling an updated visit with Dr. Theophilus. Lab work from 10/04/23 was reviewed today in the office: complements WNL, dsDNA 1, ESR 34, ANA+ 1:1640NS, 1:40 cytoplasmic, RF 11, SPEP unremarkable.  She will remain on Imuran  as prescribed.  She was advised to notify us  if she develops any new or worsening symptoms.  She will follow-up in the office in 5 months or sooner if needed.  High risk medication use - Imuran  50 mg 1 tablet by mouth daily-reduced due to neutropenia. CBC and CMP updated on 10/18/23.   Discussed the importance of holding imuran  if she develops signs or symptoms of an infection and to resume once the infection has completely cleared.   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 Patient has established care with Dr. Sherrod and will be undergoing a bone marrow biopsy.  Reviewed office  visit note from 10/18/2023  Lymphoid interstitial pneumonia (HCC) - Followed by Dr. Theophilus, on 08/04/2022 PFTs had improved and diffusion capacity.  High-resolution chest CT 01/29/2023: Cystic lung disease.  Patient's last visit with Dr. Theophilus was on 11/07/2022.  No new or worsening pulmonary symptoms. She plans on scheduling an updated office visit with Dr. Forrestine.  Other fatigue: Chronic  Abnormal SPEP: Normal SPEP 10/04/2023  Age-related osteoporosis without current pathological fracture: DEXA 09/27/23: left femoral neck BMD 0.551 with T-score -2.7.  Right femoral neck BMD 0.566 with T-score -2.6.  Patient has been started on Fosamax 70 mg 1 tablet by mouth once weekly--by Dr. Adonna has had a total of 2 doses--no SE.   Primary osteoarthritis of both hands: CMC, PIP, DIP thickening consistent with osteoarthritis of both hands.  Subluxation of both CMC joints noted.  No synovitis noted.   Chronic pain of both hips: She has good range of motion of both hip joints with no groin pain currently.  Intermittent discomfort due to trochanteric bursitis of both hips.  Primary osteoarthritis of both knees: Chronic pain in both knees, left greater than right.  Discomfort with range of motion of both knees noted.  Inflammation was noted in the left knee.  Patient requested a left knee joint cortisone injection today.  She tolerated procedure well.  Procedure note was completed above.  Aftercare was discussed.  Effusion, left knee - MRI of the left knee 09/27/2021:  She underwent left knee arthroscopic surgery on 12/12/2021 performed by Dr. Jerri.  She has continued to have persistent pain in the left knee.  She had a left knee joint cortisone injection performed on 01/13/2023 which showed significant relief but her symptoms recurred about 2 months ago.  Patient requested a repeat left knee joint cortisone injection today.  She tolerated the procedure well.  Procedure note was completed above.  Aftercare was  discussed. - Plan: Large Joint Inj: L knee  Chondromalacia patellae, right knee: Discomfort with full flexion and  extension of the right knee.  Chondrocalcinosis - She is taking Mitigare  0.6 mg 1 capsule by mouth daily.   Cervical spondylosis without myelopathy: C-spine has limited range of motion with lateral rotation.  Fibromyalgia: She has generalized hyperalgesia and positive tender points on exam.  Other medical conditions are listed as follows:  History of diabetes insipidus  History of migraine  History of hypothyroidism  History of anxiety  Prolonged QT interval  Orders: Orders Placed This Encounter  Procedures   Large Joint Inj: L knee   No orders of the defined types were placed in this encounter.    Follow-Up Instructions: Return in about 5 months (around 03/27/2024) for Sjogren's syndrome, Osteoarthritis.   Waddell CHRISTELLA Craze, PA-C  Note - This record has been created using Dragon software.  Chart creation errors have been sought, but may not always  have been located. Such creation errors do not reflect on  the standard of medical care.

## 2023-10-14 ENCOUNTER — Telehealth: Payer: Self-pay

## 2023-10-14 NOTE — Telephone Encounter (Signed)
 Received DEXA results from [facility].  Date of Scan: 09/27/2023  Lowest T-score:-2.7  BMD:0.551  Lowest site measured:Left Femoral Neck  DX: Osteopororsis  Significant changes in BMD and site measured (5% and above):N/A  Current Regimen:N/A  Recommendation:Discuss at follow up   Reviewed by:Dr. Dolphus  Next Appointment:  10/26/2023

## 2023-10-15 ENCOUNTER — Other Ambulatory Visit: Payer: Self-pay

## 2023-10-15 DIAGNOSIS — D72819 Decreased white blood cell count, unspecified: Secondary | ICD-10-CM

## 2023-10-18 ENCOUNTER — Inpatient Hospital Stay: Attending: Internal Medicine

## 2023-10-18 ENCOUNTER — Inpatient Hospital Stay: Admitting: Internal Medicine

## 2023-10-18 VITALS — BP 115/72 | HR 76 | Temp 98.1°F | Resp 17 | Ht 66.0 in | Wt 146.0 lb

## 2023-10-18 DIAGNOSIS — M069 Rheumatoid arthritis, unspecified: Secondary | ICD-10-CM | POA: Diagnosis not present

## 2023-10-18 DIAGNOSIS — D72818 Other decreased white blood cell count: Secondary | ICD-10-CM | POA: Insufficient documentation

## 2023-10-18 DIAGNOSIS — Z862 Personal history of diseases of the blood and blood-forming organs and certain disorders involving the immune mechanism: Secondary | ICD-10-CM

## 2023-10-18 DIAGNOSIS — M329 Systemic lupus erythematosus, unspecified: Secondary | ICD-10-CM | POA: Diagnosis not present

## 2023-10-18 DIAGNOSIS — Z79899 Other long term (current) drug therapy: Secondary | ICD-10-CM | POA: Insufficient documentation

## 2023-10-18 DIAGNOSIS — M3500 Sicca syndrome, unspecified: Secondary | ICD-10-CM | POA: Insufficient documentation

## 2023-10-18 DIAGNOSIS — D72819 Decreased white blood cell count, unspecified: Secondary | ICD-10-CM

## 2023-10-18 LAB — CBC WITH DIFFERENTIAL (CANCER CENTER ONLY)
Abs Immature Granulocytes: 0.01 K/uL (ref 0.00–0.07)
Basophils Absolute: 0 K/uL (ref 0.0–0.1)
Basophils Relative: 1 %
Eosinophils Absolute: 0.1 K/uL (ref 0.0–0.5)
Eosinophils Relative: 2 %
HCT: 34.2 % — ABNORMAL LOW (ref 36.0–46.0)
Hemoglobin: 11.7 g/dL — ABNORMAL LOW (ref 12.0–15.0)
Immature Granulocytes: 0 %
Lymphocytes Relative: 13 %
Lymphs Abs: 0.3 K/uL — ABNORMAL LOW (ref 0.7–4.0)
MCH: 30.4 pg (ref 26.0–34.0)
MCHC: 34.2 g/dL (ref 30.0–36.0)
MCV: 88.8 fL (ref 80.0–100.0)
Monocytes Absolute: 0.4 K/uL (ref 0.1–1.0)
Monocytes Relative: 15 %
Neutro Abs: 1.7 K/uL (ref 1.7–7.7)
Neutrophils Relative %: 69 %
Platelet Count: 214 K/uL (ref 150–400)
RBC: 3.85 MIL/uL — ABNORMAL LOW (ref 3.87–5.11)
RDW: 13.3 % (ref 11.5–15.5)
WBC Count: 2.5 K/uL — ABNORMAL LOW (ref 4.0–10.5)
nRBC: 0 % (ref 0.0–0.2)

## 2023-10-18 LAB — CMP (CANCER CENTER ONLY)
ALT: 17 U/L (ref 0–44)
AST: 25 U/L (ref 15–41)
Albumin: 4.3 g/dL (ref 3.5–5.0)
Alkaline Phosphatase: 104 U/L (ref 38–126)
Anion gap: 6 (ref 5–15)
BUN: 15 mg/dL (ref 8–23)
CO2: 29 mmol/L (ref 22–32)
Calcium: 9.5 mg/dL (ref 8.9–10.3)
Chloride: 104 mmol/L (ref 98–111)
Creatinine: 0.67 mg/dL (ref 0.44–1.00)
GFR, Estimated: >60 mL/min (ref >60)
Glucose, Bld: 92 mg/dL (ref 70–99)
Potassium: 3.9 mmol/L (ref 3.5–5.1)
Sodium: 139 mmol/L (ref 135–145)
Total Bilirubin: 0.5 mg/dL (ref 0.0–1.2)
Total Protein: 7.5 g/dL (ref 6.5–8.1)

## 2023-10-18 LAB — LACTATE DEHYDROGENASE: LDH: 174 U/L (ref 98–192)

## 2023-10-18 NOTE — Progress Notes (Signed)
 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   PRIOR THERAPY: None  CURRENT THERAPY: Observation.   INTERVAL HISTORY: Stefanie [name] 63 y.o. female returns to the clinic today for follow-up visit.Discussed the use of AI scribe software for clinical note transcription with the patient, who gave verbal consent to proceed.  History of Present Illness Stefanie [name] is a 63 year old female with persistent leukocytopenia who presents for evaluation with repeat blood work. She was referred by Dr. Roxanne and Dr. Loreda for further evaluation of leukocytopenia.  She has a history of persistent leukocytopenia with a current white blood cell count of 2.5. She reports having had a bone marrow biopsy in the past when she had a brain tumor.  Her medical history includes rheumatologic disorders such as Sjogren's syndrome, lupus, and rheumatoid arthritis. She experiences significant joint pain, particularly in her hands, which she describes as severe.  She recently underwent a bone density test, which indicated worsening in her back. She continues to take Fosamax for this condition.  She mentions needing a knee replacement, which has been postponed for another year. She is planning to retire in November and is looking forward to a [place] from [place] 22 to September 2.    MEDICAL HISTORY: Past Medical History:  Diagnosis Date   Anemia    Arthritis    Asthma    DDD (degenerative disc disease), lumbar    Diabetes insipidus (HCC)    Fibromyalgia    Headache    hx of migraines   Hypothyroidism    Lupus    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HCC)    secondary to  Sjogren's syndrome   Mitral valve prolapse    pt had echo on 09/04/21   PONV (postoperative nausea and vomiting)    PVC's (premature ventricular contractions)    SLE (systemic lupus erythematosus) (HCC) 12/06/2012   Thyroid  disease     ALLERGIES:  is allergic to iron sucrose, morphine , venofer [ferric oxide], azithromycin, and other.  MEDICATIONS:  Current Outpatient Medications  Medication Sig Dispense Refill   acetaminophen  (TYLENOL ) 500 MG tablet Take 1,000 mg by mouth every 6 (six) hours as needed for moderate pain.     albuterol  (PROVENTIL  HFA;VENTOLIN  HFA) 108 (90 BASE) MCG/ACT inhaler Inhale 1 puff into the lungs every 6 (six) hours as needed for wheezing or shortness of breath.     azaTHIOprine  (IMURAN ) 50 MG tablet TAKE 1 TABLET BY MOUTH EVERY DAY 90 tablet 0   Colchicine  (MITIGARE ) 0.6 MG CAPS Take 1 capsule (0.6 mg total) by mouth daily. 90 capsule 0   HYDROcodone -acetaminophen  (NORCO) 5-325 MG tablet Take 1 tablet by mouth 3 (three) times daily as needed. To be taken after surgery (Patient not taking: Reported on 07/01/2023) 20 tablet 0   ibuprofen  (ADVIL ) 200 MG tablet Take 200 mg by mouth every 6 (six) hours as needed.     levothyroxine  (SYNTHROID ) 112 MCG tablet Take 112 mcg by mouth daily before breakfast.     loratadine (CLARITIN) 10 MG tablet Take 10 mg by mouth daily.     methocarbamol  (ROBAXIN ) 500 MG tablet Take 1 tablet (500 mg total) by mouth daily as needed for muscle spasms. 14 tablet 0   Polyvinyl  Alcohol-Povidone (REFRESH OP) Place 1 drop into both eyes as needed (dry eyes).     No current facility-administered medications for this visit.    SURGICAL HISTORY:  Past Surgical History:  Procedure Laterality Date   COLONOSCOPY     KNEE ARTHROSCOPY WITH MENISCAL REPAIR Left 12/12/2021   Procedure: LEFT KNEE ARTHROSCOPIC DEBRIDEMENT, LATERAL MENISCECTOMY;  Surgeon: Jerri Kay HERO, MD;  Location: [facility] OR;  Service: Orthopedics;  Laterality: Left;   PITUITARY EXCISION   1993   TONSILLECTOMY      REVIEW OF SYSTEMS:  A comprehensive review of systems was negative except for: Constitutional: positive for fatigue Musculoskeletal: positive for arthralgias   PHYSICAL EXAMINATION: General appearance: alert, cooperative, fatigued, and no distress Head: Normocephalic, without obvious abnormality, atraumatic Neck: no adenopathy, no JVD, supple, symmetrical, trachea midline, and thyroid  not enlarged, symmetric, no tenderness/mass/nodules Lymph nodes: Cervical, supraclavicular, and axillary nodes normal. Resp: clear to auscultation bilaterally Back: symmetric, no curvature. ROM normal. No CVA tenderness. Cardio: regular rate and rhythm, S1, S2 normal, no murmur, click, rub or gallop GI: soft, non-tender; bowel sounds normal; no masses,  no organomegaly Extremities: extremities normal, atraumatic, no cyanosis or edema  ECOG PERFORMANCE STATUS: 1 - Symptomatic but completely ambulatory  Blood pressure 115/72, pulse 76, temperature 98.1 F (36.7 C), temperature source Temporal, resp. rate 17, height 5' 6 (1.676 m), weight 146 lb (66.2 kg), last menstrual period 04/09/2014, SpO2 97%.  LABORATORY DATA: Lab Results  Component Value Date   WBC 2.5 (L) 10/18/2023   HGB 11.7 (L) 10/18/2023   HCT 34.2 (L) 10/18/2023   MCV 88.8 10/18/2023   PLT 214 10/18/2023      Chemistry      Component Value Date/Time   NA 137 10/04/2023 1535   NA 138 04/24/2020 0946   K 4.4 10/04/2023 1535   CL 101 10/04/2023 1535   CO2 28 10/04/2023 1535   BUN 15 10/04/2023 1535   BUN 13 04/24/2020 0946   CREATININE 0.74 10/04/2023 1535      Component Value Date/Time   CALCIUM 9.5 10/04/2023 1535   ALKPHOS 106 01/25/2023 1058   AST 25 10/04/2023 1535   AST 21 01/25/2023 1058   ALT 16 10/04/2023 1535   ALT 15 01/25/2023 1058   BILITOT 0.6 10/04/2023 1535   BILITOT 0.7 01/25/2023 1058       RADIOGRAPHIC STUDIES: No results found.  ASSESSMENT AND PLAN: This is a very pleasant  63 years old white female with persistent leukocytopenia likely secondary to autoimmune disorder in a patient with a history of rheumatoid arthritis, Sjogren syndrome as well as lupus erythematosus.  Other etiology could not be excluded at this point. Repeat CBC today showed persistent leukocytopenia with total white blood count of 2.5 but absolute neutrophil count was normal at 1700.  She has low hemoglobin of 11.7 and hematocrit 34.2. 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 Current white blood cell count is 2.5. Hemoglobin is 11.7 and platelets are within normal range. Differential diagnosis includes potential bone marrow pathology such as malignancy, although autoimmune disorder is the leading cause. Bone marrow biopsy is considered to confirm diagnosis and rule out other causes. - Order bone marrow biopsy after her return from vacation to confirm diagnosis and rule out other causes of leukocytopenia. - Schedule follow-up appointment two weeks after the biopsy to discuss results.  Autoimmune disorders (Sjogren's syndrome, systemic lupus erythematosus, rheumatoid arthritis) Sjogren's syndrome, systemic lupus erythematosus, and rheumatoid  arthritis are currently under observation.  She was advised to call immediately if she has any concerning symptoms in the interval. The patient voices understanding of current disease status and treatment options and is in agreement with the current care plan.  All questions were answered. The patient knows to call the clinic with any problems, questions or concerns. We can certainly see the patient much sooner if necessary.  The total time spent in the appointment was 20 minutes.  Disclaimer: This note was dictated with voice recognition software. Similar sounding words can inadvertently be transcribed and may not be corrected upon review.

## 2023-10-19 NOTE — Addendum Note (Signed)
 Addended by: CAROLEE LOA DEL on: 10/19/2023 04:40 PM   Modules accepted: Orders

## 2023-10-24 ENCOUNTER — Other Ambulatory Visit: Payer: Self-pay | Admitting: Physician Assistant

## 2023-10-25 ENCOUNTER — Telehealth: Payer: Self-pay | Admitting: Medical Oncology

## 2023-10-25 ENCOUNTER — Other Ambulatory Visit: Payer: Self-pay

## 2023-10-25 MED ORDER — COLCHICINE 0.6 MG PO CAPS: 1.0000 | ORAL_CAPSULE | Freq: Every day | ORAL | 0 refills | Status: DC

## 2023-10-25 NOTE — Telephone Encounter (Signed)
 Last Fill: 07/01/2023  Labs: 10/18/2023 WBC 2.5 RBC 3.85 Hemoglobin 11.7 HCT 34.2 Lymphs 0.3 CMP WNL   Next Visit: 10/26/2023  Last Visit: 07/01/2023  DX: Chondrocalcinosis   Current Dose per office note 07/01/2023: Mitigare  0.6 mg 1 capsule by mouth daily   Okay to refill Colchicine ?

## 2023-10-25 NOTE — Telephone Encounter (Signed)
 Pt called and said her provider wants to prescribe ceftinir  for a sinus infection .  Per Dr. Sherrod , he said she can take cefdinir and pt notified.

## 2023-10-26 ENCOUNTER — Encounter: Payer: Self-pay | Admitting: Physician Assistant

## 2023-10-26 ENCOUNTER — Ambulatory Visit: Attending: Physician Assistant | Admitting: Physician Assistant

## 2023-10-26 VITALS — BP 96/60 | HR 75 | Resp 14 | Ht 66.0 in | Wt 147.0 lb

## 2023-10-26 DIAGNOSIS — M2241 Chondromalacia patellae, right knee: Secondary | ICD-10-CM

## 2023-10-26 DIAGNOSIS — R252 Cramp and spasm: Secondary | ICD-10-CM

## 2023-10-26 DIAGNOSIS — M3501 Sicca syndrome with keratoconjunctivitis: Secondary | ICD-10-CM | POA: Diagnosis not present

## 2023-10-26 DIAGNOSIS — M1120 Other chondrocalcinosis, unspecified site: Secondary | ICD-10-CM

## 2023-10-26 DIAGNOSIS — D702 Other drug-induced agranulocytosis: Secondary | ICD-10-CM

## 2023-10-26 DIAGNOSIS — R5383 Other fatigue: Secondary | ICD-10-CM

## 2023-10-26 DIAGNOSIS — J842 Lymphoid interstitial pneumonia: Secondary | ICD-10-CM

## 2023-10-26 DIAGNOSIS — M19041 Primary osteoarthritis, right hand: Secondary | ICD-10-CM

## 2023-10-26 DIAGNOSIS — M25462 Effusion, left knee: Secondary | ICD-10-CM

## 2023-10-26 DIAGNOSIS — M19042 Primary osteoarthritis, left hand: Secondary | ICD-10-CM

## 2023-10-26 DIAGNOSIS — R778 Other specified abnormalities of plasma proteins: Secondary | ICD-10-CM | POA: Diagnosis not present

## 2023-10-26 DIAGNOSIS — M25552 Pain in left hip: Secondary | ICD-10-CM

## 2023-10-26 DIAGNOSIS — M25551 Pain in right hip: Secondary | ICD-10-CM

## 2023-10-26 DIAGNOSIS — M797 Fibromyalgia: Secondary | ICD-10-CM

## 2023-10-26 DIAGNOSIS — Z8669 Personal history of other diseases of the nervous system and sense organs: Secondary | ICD-10-CM

## 2023-10-26 DIAGNOSIS — R9431 Abnormal electrocardiogram [ECG] [EKG]: Secondary | ICD-10-CM

## 2023-10-26 DIAGNOSIS — M810 Age-related osteoporosis without current pathological fracture: Secondary | ICD-10-CM

## 2023-10-26 DIAGNOSIS — Z8659 Personal history of other mental and behavioral disorders: Secondary | ICD-10-CM

## 2023-10-26 DIAGNOSIS — Z8639 Personal history of other endocrine, nutritional and metabolic disease: Secondary | ICD-10-CM

## 2023-10-26 DIAGNOSIS — G8929 Other chronic pain: Secondary | ICD-10-CM

## 2023-10-26 DIAGNOSIS — M170 Bilateral primary osteoarthritis of knee: Secondary | ICD-10-CM | POA: Diagnosis not present

## 2023-10-26 DIAGNOSIS — Z79899 Other long term (current) drug therapy: Secondary | ICD-10-CM | POA: Diagnosis not present

## 2023-10-26 DIAGNOSIS — M47812 Spondylosis without myelopathy or radiculopathy, cervical region: Secondary | ICD-10-CM

## 2023-10-26 MED ORDER — TRIAMCINOLONE ACETONIDE 40 MG/ML IJ SUSP
40.0000 mg | INTRAMUSCULAR | Status: AC | PRN
  Administered 2023-10-26: 40 mg via INTRA_ARTICULAR

## 2023-10-26 MED ORDER — LIDOCAINE HCL 1 % IJ SOLN
1.5000 mL | INTRAMUSCULAR | Status: AC | PRN
  Administered 2023-10-26: 1.5 mL

## 2023-10-29 ENCOUNTER — Telehealth: Payer: Self-pay | Admitting: Pulmonary Disease

## 2023-10-29 NOTE — Telephone Encounter (Signed)
 Pt has been scheduled and LVM with details and mailing reminder as well. NFN

## 2023-10-29 NOTE — Telephone Encounter (Signed)
 Copied from CRM [id number]. Topic: Appointments - Scheduling Inquiry for Clinic >> Oct 29, 2023  9:41 AM Corean SAUNDERS wrote: Reason for CRM: Patient is requesting a call back so that she may schedule her CT (ordered) prior to her follow up with Dr. Theophilus on 10/8

## 2023-11-10 ENCOUNTER — Encounter: Payer: Self-pay | Admitting: Pulmonary Disease

## 2023-11-12 ENCOUNTER — Other Ambulatory Visit: Payer: Self-pay | Admitting: Radiology

## 2023-11-12 DIAGNOSIS — H5213 Myopia, bilateral: Secondary | ICD-10-CM | POA: Diagnosis not present

## 2023-11-12 DIAGNOSIS — H2513 Age-related nuclear cataract, bilateral: Secondary | ICD-10-CM | POA: Diagnosis not present

## 2023-11-12 DIAGNOSIS — H04123 Dry eye syndrome of bilateral lacrimal glands: Secondary | ICD-10-CM | POA: Diagnosis not present

## 2023-11-12 DIAGNOSIS — D709 Neutropenia, unspecified: Secondary | ICD-10-CM

## 2023-11-15 ENCOUNTER — Encounter (HOSPITAL_COMMUNITY): Payer: Self-pay

## 2023-11-15 ENCOUNTER — Ambulatory Visit (HOSPITAL_COMMUNITY)
Admission: RE | Admit: 2023-11-15 | Discharge: 2023-11-15 | Disposition: A | Discharge status: Home / Self Care | Source: Ambulatory Visit | Attending: Internal Medicine | Admitting: Internal Medicine

## 2023-11-15 ENCOUNTER — Other Ambulatory Visit: Payer: Self-pay

## 2023-11-15 DIAGNOSIS — L930 Discoid lupus erythematosus: Secondary | ICD-10-CM | POA: Diagnosis not present

## 2023-11-15 DIAGNOSIS — M3500 Sicca syndrome, unspecified: Secondary | ICD-10-CM | POA: Diagnosis not present

## 2023-11-15 DIAGNOSIS — M069 Rheumatoid arthritis, unspecified: Secondary | ICD-10-CM | POA: Diagnosis not present

## 2023-11-15 DIAGNOSIS — D72819 Decreased white blood cell count, unspecified: Secondary | ICD-10-CM | POA: Diagnosis not present

## 2023-11-15 DIAGNOSIS — D709 Neutropenia, unspecified: Secondary | ICD-10-CM

## 2023-11-15 DIAGNOSIS — Z862 Personal history of diseases of the blood and blood-forming organs and certain disorders involving the immune mechanism: Secondary | ICD-10-CM

## 2023-11-15 LAB — CBC WITH DIFFERENTIAL/PLATELET
Abs Immature Granulocytes: 0.01 K/uL (ref 0.00–0.07)
Basophils Absolute: 0 K/uL (ref 0.0–0.1)
Basophils Relative: 0 %
Eosinophils Absolute: 0.1 K/uL (ref 0.0–0.5)
Eosinophils Relative: 3 %
HCT: 42.5 % (ref 36.0–46.0)
Hemoglobin: 13.5 g/dL (ref 12.0–15.0)
Immature Granulocytes: 0 %
Lymphocytes Relative: 15 %
Lymphs Abs: 0.4 K/uL — ABNORMAL LOW (ref 0.7–4.0)
MCH: 29.3 pg (ref 26.0–34.0)
MCHC: 31.8 g/dL (ref 30.0–36.0)
MCV: 92.2 fL (ref 80.0–100.0)
Monocytes Absolute: 0.4 K/uL (ref 0.1–1.0)
Monocytes Relative: 18 %
Neutro Abs: 1.6 K/uL — ABNORMAL LOW (ref 1.7–7.7)
Neutrophils Relative %: 64 %
Platelets: 198 K/uL (ref 150–400)
RBC: 4.61 MIL/uL (ref 3.87–5.11)
RDW: 13.7 % (ref 11.5–15.5)
WBC: 2.4 K/uL — ABNORMAL LOW (ref 4.0–10.5)
nRBC: 0 % (ref 0.0–0.2)

## 2023-11-15 MED ORDER — NALOXONE HCL 0.4 MG/ML IJ SOLN
INTRAMUSCULAR | Status: AC
  Filled 2023-11-15: qty 1

## 2023-11-15 MED ORDER — FLUMAZENIL 0.5 MG/5ML IV SOLN
INTRAVENOUS | Status: AC
  Filled 2023-11-15: qty 5

## 2023-11-15 MED ORDER — FENTANYL CITRATE (PF) 100 MCG/2ML IJ SOLN
INTRAMUSCULAR | Status: AC
  Filled 2023-11-15: qty 2

## 2023-11-15 MED ORDER — SODIUM CHLORIDE 0.9 % IV SOLN: INTRAVENOUS | Status: DC

## 2023-11-15 MED ORDER — MIDAZOLAM HCL 2 MG/2ML IJ SOLN
INTRAMUSCULAR | Status: AC
Start: 2023-11-15 — End: 2023-11-15
  Filled 2023-11-15: qty 2

## 2023-11-15 MED ORDER — FENTANYL CITRATE (PF) 100 MCG/2ML IJ SOLN
INTRAMUSCULAR | Status: AC | PRN
  Administered 2023-11-15: 50 ug via INTRAVENOUS
  Administered 2023-11-15: 25 ug via INTRAVENOUS

## 2023-11-15 MED ORDER — MIDAZOLAM HCL 2 MG/2ML IJ SOLN
INTRAMUSCULAR | Status: AC | PRN
  Administered 2023-11-15: 1 mg via INTRAVENOUS

## 2023-11-15 NOTE — Discharge Instructions (Signed)

## 2023-11-15 NOTE — Procedures (Signed)
 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   Procedure: CT guided bone marrow biopsy  Findings: 2 aspirates and 1 core from right ilium  Complications: None     EBL: Minimal, less than 10 ml  Plan: Discharge to home in one hour.   Hatcher Froning R. Philip, MD  Pager: [phone]

## 2023-11-15 NOTE — H&P (Signed)
 Chief Complaint: Leukopenia - IR consulted for image guided bone marrow biopsy and aspiration  Referring Provider(s): Sherrod Sherrod, MD   Supervising Physician: Philip Cornet  Patient Status: [facility] - Out-pt  History of Present Illness: Stefanie [name] is a 63 y.o. female with hx of Sjogren, RA, Lupus, persistent  leukocytopenia. Pt has been followed by oncologist Dr. Sherrod. Suspected patients leukopenia likely due to autoimmune cause, but IR has now been consulted for bone marrow biopsy and aspiration for further evaluation.   Today pt with no complaint. All questions answered. Has been NPO since midnight.   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   Mitral valve prolapse    pt had echo on 09/04/21   PONV (postoperative nausea and vomiting)    PVC's (premature ventricular contractions)    SLE (systemic lupus erythematosus) (HCC) 12/06/2012   Thyroid  disease     Past Surgical History:  Procedure Laterality Date   COLONOSCOPY     KNEE ARTHROSCOPY WITH MENISCAL REPAIR Left 12/12/2021   Procedure: LEFT KNEE ARTHROSCOPIC DEBRIDEMENT, LATERAL MENISCECTOMY;  Surgeon: Jerri Kay HERO, MD;  Location: [facility] OR;  Service: Orthopedics;  Laterality: Left;   PITUITARY EXCISION  1993   TONSILLECTOMY      Allergies: Iron sucrose, Morphine , Venofer [ferric oxide], Azithromycin, and Other  Medications: Prior to Admission medications   Medication Sig Start Date End Date Taking? Authorizing Provider  acetaminophen  (TYLENOL ) 500 MG tablet Take 1,000 mg by mouth every 6 (six) hours as needed for moderate pain.   Yes [provider]  albuterol  (PROVENTIL  HFA;VENTOLIN  HFA) 108 (90 BASE) MCG/ACT inhaler  Inhale 1 puff into the lungs every 6 (six) hours as needed for wheezing or shortness of breath.   Yes [provider]  alendronate (FOSAMAX) 70 MG tablet Take 70 mg by mouth once a week. 10/04/23  Yes [provider]  azaTHIOprine  (IMURAN ) 50 MG tablet TAKE 1 TABLET BY MOUTH EVERY DAY 10/04/23  Yes Cheryl Waddell HERO, PA-C  Colchicine  (MITIGARE ) 0.6 MG CAPS Take 1 capsule (0.6 mg total) by mouth daily. 10/25/23  Yes Cheryl Waddell HERO, PA-C  levothyroxine  (SYNTHROID ) 88 MCG tablet Take 88 mcg by mouth daily before breakfast.   Yes [provider]  loratadine (CLARITIN) 10 MG tablet Take 10 mg by mouth daily.   Yes [provider]  cefdinir (OMNICEF) 300 MG capsule Take 1 capsule po 2x a day x 5 days Orally as directed; Duration: 5 days 10/25/23   [provider]     Family History  Problem Relation Age of Onset   Atrial fibrillation Mother    Dementia Mother    Diabetes Father    Heart disease Father    Hyperlipidemia Father    Hypertension Father    Stroke Father    Diabetes Sister    Kidney disease Sister        transplant   Heart disease Sister        open heart surgery    Pancreatic disease Sister        transplant    Cancer Maternal Grandmother  Diabetes Paternal Grandfather    Heart disease Paternal Grandfather    Hyperlipidemia Paternal Grandfather    Hypertension Paternal Actor    Healthy Daughter    Healthy Son    Colon cancer Neg Hx    Esophageal cancer Neg Hx    Rectal cancer Neg Hx    Stomach cancer Neg Hx     Social History   Socioeconomic History   Marital status: Married    Spouse name: Not on file   Number of children: 2   Years of education: Not on file   Highest education level: Not on file  Occupational History   Not on file  Tobacco Use   Smoking status: Never    Passive exposure: Never   Smokeless tobacco: Never  Vaping Use   Vaping status: Never Used  Substance and Sexual Activity   Alcohol use: No    Drug use: No   Sexual activity: Not on file  Other Topics Concern   Not on file  Social History Narrative   Right handed   Social Drivers of Health   Financial Resource Strain: Not on file  Food Insecurity: Not on file  Transportation Needs: Not on file  Physical Activity: Not on file  Stress: Not on file  Social Connections: Not on file     Review of Systems: A 12 point ROS discussed and pertinent positives are indicated in the HPI above.  All other systems are negative.    Vital Signs: Pulse 77   Temp 98.2 F (36.8 C) (Oral)   Resp 15   Ht 5' 6 (1.676 m)   Wt 147 lb (66.7 kg)   LMP 04/09/2014 (Approximate)   SpO2 99%   BMI 23.73 kg/m   Advance Care Plan: No documents on file  Physical Exam Vitals and nursing note reviewed.  Constitutional:      Appearance: Normal appearance.  HENT:     Mouth/Throat:     Mouth: Mucous membranes are moist.     Pharynx: Oropharynx is clear.  Cardiovascular:     Rate and Rhythm: Normal rate and regular rhythm.  Pulmonary:     Effort: Pulmonary effort is normal.     Breath sounds: Normal breath sounds.  Abdominal:     Palpations: Abdomen is soft.     Tenderness: There is no abdominal tenderness.  Musculoskeletal:     Right lower leg: No edema.     Left lower leg: No edema.  Skin:    General: Skin is warm and dry.  Neurological:     Mental Status: She is alert and oriented to person, place, and time. Mental status is at baseline.     Imaging: No results found.  Labs:  CBC: Recent Labs    07/01/23 1540 10/04/23 1535 10/18/23 1455 11/15/23 0730  WBC 2.7* 2.8* 2.5* 2.4*  HGB 12.7 12.6 11.7* 13.5  HCT 37.8 39.2 34.2* 42.5  PLT 222 230 214 198    COAGS: No results for input(s): INR, APTT in the last 8760 hours.  BMP: Recent Labs    01/25/23 1058 07/01/23 1540 10/04/23 1535 10/18/23 1455  NA 138 139 137 139  K 3.9 4.1 4.4 3.9  CL 104 102 101 104  CO2 28 29 28 29   GLUCOSE 90 85 84 92  BUN 13  18 15 15   CALCIUM 9.5 9.6 9.5 9.5  CREATININE 0.71 0.72 0.74 0.67  GFRNONAA >60  --   --  >60    LIVER FUNCTION  TESTS: Recent Labs    01/25/23 1058 07/01/23 1540 10/04/23 1535 10/18/23 1455  BILITOT 0.7 0.5 0.6 0.5  AST 21 24 25 25   ALT 15 16 16 17   ALKPHOS 106  --   --  104  PROT 7.6 7.7  7.5 7.5  7.7 7.5  ALBUMIN 4.2  --   --  4.3    TUMOR MARKERS: No results for input(s): AFPTM, CEA, CA199, CHROMGRNA in the last 8760 hours.  Assessment and Plan:  Porfiria Heinrich is a 63 y.o. female with hx of Sjogren, RA, Lupus, persistent  leukocytopenia. Pt has been followed by oncologist Dr. Sherrod. Suspected patients leukopenia likely due to autoimmune cause, but IR has now been consulted for bone marrow biopsy and aspiration for further evaluation.   Today pt with no complaint. All questions answered. Has been NPO since midnight.  Risks and benefits of bone marrow biopsy was discussed with the patient and/or patient's family including, but not limited to bleeding, infection, damage to adjacent structures or low yield requiring additional tests.  All of the questions were answered and there is agreement to proceed.  Consent signed and in chart.   Thank you for allowing our service to participate in Donae Kueker 's care.  Electronically Signed: Kimble VEAR Clas, PA-C   11/15/2023, 8:27 AM      I spent a total of  30 Minutes   in face to face in clinical consultation, greater than 50% of which was counseling/coordinating care for bone marrow biopsy and aspiration

## 2023-11-17 LAB — SURGICAL PATHOLOGY

## 2023-11-18 LAB — SURGICAL PATHOLOGY

## 2023-11-24 ENCOUNTER — Encounter (HOSPITAL_COMMUNITY): Payer: Self-pay | Admitting: Internal Medicine

## 2023-11-25 ENCOUNTER — Other Ambulatory Visit

## 2023-11-25 ENCOUNTER — Ambulatory Visit
Admission: RE | Admit: 2023-11-25 | Discharge: 2023-11-25 | Disposition: A | Discharge status: Home / Self Care | Source: Ambulatory Visit | Attending: Pulmonary Disease | Admitting: Pulmonary Disease

## 2023-11-25 DIAGNOSIS — J849 Interstitial pulmonary disease, unspecified: Secondary | ICD-10-CM | POA: Diagnosis not present

## 2023-11-25 DIAGNOSIS — J984 Other disorders of lung: Secondary | ICD-10-CM | POA: Diagnosis not present

## 2023-11-25 DIAGNOSIS — J479 Bronchiectasis, uncomplicated: Secondary | ICD-10-CM | POA: Diagnosis not present

## 2023-11-25 DIAGNOSIS — J842 Lymphoid interstitial pneumonia: Secondary | ICD-10-CM

## 2023-11-29 ENCOUNTER — Ambulatory Visit: Admitting: Physician Assistant

## 2023-11-29 ENCOUNTER — Inpatient Hospital Stay: Admitting: Internal Medicine

## 2023-11-29 ENCOUNTER — Inpatient Hospital Stay: Attending: Internal Medicine

## 2023-11-29 VITALS — BP 120/76 | HR 75 | Temp 97.3°F | Resp 17 | Ht 66.0 in | Wt 145.7 lb

## 2023-11-29 DIAGNOSIS — M069 Rheumatoid arthritis, unspecified: Secondary | ICD-10-CM | POA: Insufficient documentation

## 2023-11-29 DIAGNOSIS — M3500 Sicca syndrome, unspecified: Secondary | ICD-10-CM | POA: Insufficient documentation

## 2023-11-29 DIAGNOSIS — Z79899 Other long term (current) drug therapy: Secondary | ICD-10-CM | POA: Insufficient documentation

## 2023-11-29 DIAGNOSIS — M329 Systemic lupus erythematosus, unspecified: Secondary | ICD-10-CM | POA: Diagnosis not present

## 2023-11-29 DIAGNOSIS — D72819 Decreased white blood cell count, unspecified: Secondary | ICD-10-CM | POA: Insufficient documentation

## 2023-11-29 DIAGNOSIS — Z862 Personal history of diseases of the blood and blood-forming organs and certain disorders involving the immune mechanism: Secondary | ICD-10-CM

## 2023-11-29 LAB — CBC WITH DIFFERENTIAL (CANCER CENTER ONLY)
Abs Immature Granulocytes: 0.01 K/uL (ref 0.00–0.07)
Basophils Absolute: 0 K/uL (ref 0.0–0.1)
Basophils Relative: 1 %
Eosinophils Absolute: 0.1 K/uL (ref 0.0–0.5)
Eosinophils Relative: 2 %
HCT: 36.6 % (ref 36.0–46.0)
Hemoglobin: 12.5 g/dL (ref 12.0–15.0)
Immature Granulocytes: 0 %
Lymphocytes Relative: 15 %
Lymphs Abs: 0.5 K/uL — ABNORMAL LOW (ref 0.7–4.0)
MCH: 30.6 pg (ref 26.0–34.0)
MCHC: 34.2 g/dL (ref 30.0–36.0)
MCV: 89.7 fL (ref 80.0–100.0)
Monocytes Absolute: 0.6 K/uL (ref 0.1–1.0)
Monocytes Relative: 17 %
Neutro Abs: 2.1 K/uL (ref 1.7–7.7)
Neutrophils Relative %: 65 %
Platelet Count: 217 K/uL (ref 150–400)
RBC: 4.08 MIL/uL (ref 3.87–5.11)
RDW: 13.8 % (ref 11.5–15.5)
WBC Count: 3.2 K/uL — ABNORMAL LOW (ref 4.0–10.5)
nRBC: 0 % (ref 0.0–0.2)

## 2023-11-29 LAB — LACTATE DEHYDROGENASE: LDH: 198 U/L — ABNORMAL HIGH (ref 98–192)

## 2023-11-29 NOTE — Progress Notes (Signed)
 [facility] [phone]   [phone]  OFFICE PROGRESS NOTE  Stefanie Rush, MD [address] [place] [address]  DIAGNOSIS: persistent leukocytopenia likely autoimmune mediated leukocytopenia in a patient with history of rheumatologic disorder including Sjogren, rheumatoid arthritis and lupus.   PRIOR THERAPY: None  CURRENT THERAPY: Observation.   INTERVAL HISTORY: Stefanie [name] 63 y.o. female returns to the clinic today for follow-up visit.Discussed the use of AI scribe software for clinical note transcription with the patient, who gave verbal consent to proceed.  History of Present Illness Stefanie [name] is a 63 year old female with persistent leukocytopenia who presents for evaluation with repeat blood work. She was referred by Dr. Roxanne and Dr. Loreda for further evaluation of leukocytopenia.  She has a history of persistent leukocytopenia with a current white blood cell count of 2.5. She reports having had a bone marrow biopsy in the past when she had a brain tumor.  Her medical history includes rheumatologic disorders such as Sjogren's syndrome, lupus, and rheumatoid arthritis. She experiences significant joint pain, particularly in her hands, which she describes as severe.  She recently underwent a bone density test, which indicated worsening in her back. She continues to take Fosamax for this condition.  She mentions needing a knee replacement, which has been postponed for another year. She is planning to retire in November and is looking forward to a [place] from [place] 22 to September 2.   Stefanie [name] is a 63 year old female with persistent leukocytopenia who presents for evaluation and repeat blood work.  She has a history of rheumatoid arthritis, Sjogren's syndrome, and lupus erythematosus, which are suspected to contribute to her persistent leukocytopenia. She recently  underwent a bone marrow biopsy and aspirate to investigate the cause of her leukocytopenia.  Her current white blood cell count is 3.2.  She is scheduled to see Dr. Theophilus in two weeks for a follow-up on a CT scan of her lungs, which was performed to evaluate 'honeycombs' in her lungs.    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  MEDICATIONS:  Current Outpatient Medications  Medication Sig Dispense Refill   acetaminophen  (TYLENOL ) 500 MG tablet Take 1,000 mg by mouth every 6 (six) hours as needed for moderate pain.     albuterol  (PROVENTIL  HFA;VENTOLIN  HFA) 108 (90 BASE) MCG/ACT inhaler Inhale 1 puff into the lungs every 6 (six) hours as needed for wheezing or shortness of breath.     alendronate (FOSAMAX) 70 MG tablet Take 70 mg by mouth once a week.     azaTHIOprine  (IMURAN ) 50 MG tablet TAKE 1 TABLET BY MOUTH EVERY DAY 90 tablet 0   cefdinir (OMNICEF) 300 MG capsule Take 1 capsule po 2x a day x  5 days Orally as directed; Duration: 5 days     Colchicine  (MITIGARE ) 0.6 MG CAPS Take 1 capsule (0.6 mg total) by mouth daily. 90 capsule 0   levothyroxine  (SYNTHROID ) 88 MCG tablet Take 88 mcg by mouth daily before breakfast.     loratadine (CLARITIN) 10 MG tablet Take 10 mg by mouth daily.     No current facility-administered medications for this visit.    SURGICAL HISTORY:  Past Surgical History:   Procedure Laterality Date   COLONOSCOPY     KNEE ARTHROSCOPY WITH MENISCAL REPAIR Left 12/12/2021   Procedure: LEFT KNEE ARTHROSCOPIC DEBRIDEMENT, LATERAL MENISCECTOMY;  Surgeon: Stefanie Kay HERO, MD;  Location: [facility] OR;  Service: Orthopedics;  Laterality: Left;   PITUITARY EXCISION  1993   TONSILLECTOMY      REVIEW OF SYSTEMS:  A comprehensive review of systems was negative except for: Constitutional: positive for fatigue Musculoskeletal: positive for arthralgias   PHYSICAL EXAMINATION: General appearance: alert, cooperative, fatigued, and no distress Head: Normocephalic, without obvious abnormality, atraumatic Neck: no adenopathy, no JVD, supple, symmetrical, trachea midline, and thyroid  not enlarged, symmetric, no tenderness/mass/nodules Lymph nodes: Cervical, supraclavicular, and axillary nodes normal. Resp: clear to auscultation bilaterally Back: symmetric, no curvature. ROM normal. No CVA tenderness. Cardio: regular rate and rhythm, S1, S2 normal, no murmur, click, rub or gallop GI: soft, non-tender; bowel sounds normal; no masses,  no organomegaly Extremities: extremities normal, atraumatic, no cyanosis or edema  ECOG PERFORMANCE STATUS: 1 - Symptomatic but completely ambulatory  Blood pressure 120/76, pulse 75, temperature (!) 97.3 F (36.3 C), resp. rate 17, height 5' 6 (1.676 m), weight 145 lb 11.2 oz (66.1 kg), last menstrual period 04/09/2014, SpO2 99%.  LABORATORY DATA: Lab Results  Component Value Date   WBC 3.2 (L) 11/29/2023   HGB 12.5 11/29/2023   HCT 36.6 11/29/2023   MCV 89.7 11/29/2023   PLT 217 11/29/2023      Chemistry      Component Value Date/Time   NA 139 10/18/2023 1455   NA 138 04/24/2020 0946   K 3.9 10/18/2023 1455   CL 104 10/18/2023 1455   CO2 29 10/18/2023 1455   BUN 15 10/18/2023 1455   BUN 13 04/24/2020 0946   CREATININE 0.67 10/18/2023 1455   CREATININE 0.74 10/04/2023 1535      Component Value Date/Time   CALCIUM 9.5 10/18/2023 1455    ALKPHOS 104 10/18/2023 1455   AST 25 10/18/2023 1455   ALT 17 10/18/2023 1455   BILITOT 0.5 10/18/2023 1455       RADIOGRAPHIC STUDIES: CT BONE MARROW BIOPSY & ASPIRATION Result Date: 11/15/2023 INDICATION: Persistent leukocytopenia, likely autoimmune mediated leukocytopenia. EXAM: CT GUIDED BONE MARROW ASPIRATES AND BIOPSY Physician: Stefanie SAUNDERS. Henn, MD MEDICATIONS: Moderate sedation ANESTHESIA/SEDATION: Moderate (conscious) sedation was employed during this procedure. A total of Versed  1 mg and fentanyl  75 mcg was administered intravenously at the order of the provider performing the procedure. Total intra-service moderate sedation time: 8 minutes. Patient's level of consciousness and vital signs were monitored continuously by radiology nurse throughout the procedure under the supervision of the provider performing the procedure. COMPLICATIONS: None immediate. PROCEDURE: The procedure was explained to the patient. The risks and benefits of the procedure were discussed and the patient's questions were addressed. Informed consent was obtained from the patient. The patient was placed prone on CT table. Images of the pelvis were obtained. The right side of back was prepped and draped in sterile fashion. The skin and right posterior ilium  were anesthetized with 1% lidocaine . 11 gauge bone needle was directed into the right ilium with CT guidance. Two aspirates and one core biopsy were obtained. Bandage placed over the puncture site. FINDINGS: Biopsy needle directed into the posterior right ilium IMPRESSION: CT guided bone marrow aspiration and core biopsy. Electronically Signed   By: Stefanie Balder M.D.   On: 11/15/2023 13:12    ASSESSMENT AND PLAN: This is a very pleasant 63 years old white female with persistent leukocytopenia likely secondary to autoimmune disorder in a patient with a history of rheumatoid arthritis, Sjogren syndrome as well as lupus erythematosus.  Other etiology could not be excluded at  this point. Repeat CBC today showed total white blood count of 3.2 with absolute neutrophil count of 2100. She had a bone marrow biopsy and aspirate performed recently.  It showed no concerning finding for bone marrow disorder and it is likely to be related to secondary condition like autoimmune disorder. 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 Recent bone marrow biopsy and aspirate showed no evidence of leukemia, lymphoma, myeloproliferative disorder, or myelodysplastic syndrome, indicating that the leukopenia is not due to a primary bone marrow disorder. Current white blood cell count is 3.2, showing improvement, possibly due to bone marrow stimulation from the biopsy procedure. The condition is well-managed and not indicative of a more serious underlying hematological disorder. - Follow up with family doctor or rheumatologist as needed. - Schedule follow-up appointment in one year unless otherwise advised by primary care or rheumatologist. She was advised to call immediately if she has any other concerning symptoms in the interval. The patient voices understanding of current disease status and treatment options and is in agreement with the current care plan.  All questions were answered. The patient knows to call the clinic with any problems, questions or concerns. We can certainly see the patient much sooner if necessary.  The total time spent in the appointment was 20 minutes.  Disclaimer: This note was dictated with voice recognition software. Similar sounding words can inadvertently be transcribed and may not be corrected upon review.

## 2023-12-01 ENCOUNTER — Ambulatory Visit: Admitting: Physician Assistant

## 2023-12-03 ENCOUNTER — Ambulatory Visit: Payer: Self-pay | Admitting: Pulmonary Disease

## 2023-12-03 DIAGNOSIS — R911 Solitary pulmonary nodule: Secondary | ICD-10-CM

## 2023-12-09 ENCOUNTER — Ambulatory Visit (INDEPENDENT_AMBULATORY_CARE_PROVIDER_SITE_OTHER)

## 2023-12-09 DIAGNOSIS — J842 Lymphoid interstitial pneumonia: Secondary | ICD-10-CM | POA: Diagnosis not present

## 2023-12-09 LAB — PULMONARY FUNCTION TEST
DL/VA % pred: 85 %
DL/VA: 3.53 ml/min/mmHg/L
DLCO cor % pred: 78 %
DLCO cor: 16.83 ml/min/mmHg
DLCO unc % pred: 76 %
DLCO unc: 16.34 ml/min/mmHg
FEF 25-75 Post: 0.79 L/s
FEF 25-75 Pre: 2.41 L/s
FEF2575-%Change-Post: -67 %
FEF2575-%Pred-Post: 33 %
FEF2575-%Pred-Pre: 103 %
FEV1-%Change-Post: -41 %
FEV1-%Pred-Post: 58 %
FEV1-%Pred-Pre: 99 %
FEV1-Post: 1.55 L
FEV1-Pre: 2.64 L
FEV1FVC-%Change-Post: -40 %
FEV1FVC-%Pred-Pre: 100 %
FEV6-%Change-Post: -3 %
FEV6-%Pred-Post: 97 %
FEV6-%Pred-Pre: 100 %
FEV6-Post: 3.24 L
FEV6-Pre: 3.36 L
FEV6FVC-%Change-Post: -1 %
FEV6FVC-%Pred-Post: 100 %
FEV6FVC-%Pred-Pre: 102 %
FVC-%Change-Post: -1 %
FVC-%Pred-Post: 96 %
FVC-%Pred-Pre: 98 %
FVC-Post: 3.35 L
FVC-Pre: 3.4 L
Post FEV1/FVC ratio: 46 %
Post FEV6/FVC ratio: 97 %
Pre FEV1/FVC ratio: 78 %
Pre FEV6/FVC Ratio: 99 %
RV % pred: 161 %
RV: 3.47 L
TLC % pred: 125 %
TLC: 6.74 L

## 2023-12-09 NOTE — Patient Instructions (Signed)
 Full pft performed today

## 2023-12-09 NOTE — Progress Notes (Signed)
 Full pft performed today

## 2023-12-11 DIAGNOSIS — M79672 Pain in left foot: Secondary | ICD-10-CM | POA: Diagnosis not present

## 2023-12-13 ENCOUNTER — Other Ambulatory Visit: Payer: Self-pay | Admitting: Internal Medicine

## 2023-12-13 DIAGNOSIS — Z1231 Encounter for screening mammogram for malignant neoplasm of breast: Secondary | ICD-10-CM

## 2023-12-15 ENCOUNTER — Ambulatory Visit: Admitting: Pulmonary Disease

## 2023-12-21 DIAGNOSIS — S93492A Sprain of other ligament of left ankle, initial encounter: Secondary | ICD-10-CM | POA: Diagnosis not present

## 2024-01-03 ENCOUNTER — Other Ambulatory Visit: Payer: Self-pay | Admitting: Physician Assistant

## 2024-01-04 NOTE — Telephone Encounter (Signed)
 Last Fill: 10/04/2023  Labs: 10/18/2023 CMP WNL, 11/29/2023 WBC 3.2, Lymphs Abs 0.5  Next Visit: 03/29/2024  Last Visit: 10/26/2023  DX:  Sjogren's syndrome with keratoconjunctivitis sicca   Current Dose per office note 10/26/2023: Imuran  50 mg 1 tablet by mouth daily   Okay to refill Imuran ?

## 2024-01-17 ENCOUNTER — Encounter: Payer: Self-pay | Admitting: Pulmonary Disease

## 2024-01-17 ENCOUNTER — Ambulatory Visit: Admitting: Pulmonary Disease

## 2024-01-17 VITALS — BP 127/77 | HR 70 | Temp 98.1°F | Ht 66.0 in | Wt 142.0 lb

## 2024-01-17 DIAGNOSIS — M3501 Sicca syndrome with keratoconjunctivitis: Secondary | ICD-10-CM

## 2024-01-17 DIAGNOSIS — J842 Lymphoid interstitial pneumonia: Secondary | ICD-10-CM | POA: Diagnosis not present

## 2024-01-17 DIAGNOSIS — R0609 Other forms of dyspnea: Secondary | ICD-10-CM | POA: Diagnosis not present

## 2024-01-17 NOTE — Patient Instructions (Signed)
  VISIT SUMMARY: You had a follow-up visit to discuss your Sjogren's syndrome and interstitial lung disease. We reviewed your respiratory symptoms, fatigue, and dry mouth.  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 The recent CT scan shows no progression, and your lung function tests are stable. -Continue taking azathioprine  (Imuran ) as prescribed. -We will schedule another CT scan and lung function test in two years. -Monitor for any increase in breathlessness and report if symptoms worsen. - We will check lab test for alpha-1 antitrypsin levels.  DRY MOUTH (XEROSTOMIA) DUE TO SJOGREN'S SYNDROME: You are experiencing persistent dry mouth as a symptom of Sjogren's syndrome. -Continue to stay hydrated.

## 2024-01-17 NOTE — Progress Notes (Signed)
 Stefanie [name]    [id number]    [birth year]  Primary Care Physician:Russo, Norleen, MD  Referring Physician: Onita Norleen, MD [address] [place],  [place] [address]  Problem list:  Follow-up for Sjogren's syndrome, on Imuran  since 2021 ILD, lymphocytic interstitial pneumonia  HPI: 63 year old with history of asthma, palpitations, Sjogrens syndrome Sent in for evaluation of chronic dyspnea on exertion for the past few years associated with chest tightness, occasional wheezing She is on albuterol  inhaler for asthma which she uses very seldom Also has seasonal allergies for which she uses over-the-counter antihistamines.  Follows with Dr. Dolphus, rheumatology with positive ANA, Ro, La, Cardiolipin and rheumatoid factor.   Started on Imuran  in 2021 rheumatology  She contracted COVID twice in February 2023 treated with Paxlovid  and molnupiravir and then had flu infection.  Overall improved since this episode.  Interim history: Discussed the use of AI scribe software for clinical note transcription with the patient, who gave verbal consent to proceed. History of Present Illness Stefanie [name] is a 63 year old female with Sjogren's syndrome and interstitial lung disease who presents for follow-up.  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  No mold, hot tub, Jacuzzi.  No down pillows or comforter Smoking history: Never smoker Travel  history: No significant travel history Relevant family history: No significant family history of lung disease  Outpatient Encounter Medications as of 01/17/2024  Medication Sig   acetaminophen  (TYLENOL ) 500 MG tablet Take 1,000 mg by mouth every 6 (six) hours as needed for moderate pain.   albuterol  (PROVENTIL  HFA;VENTOLIN  HFA) 108 (90 BASE) MCG/ACT inhaler Inhale 1 puff into the lungs every 6 (six) hours as needed for wheezing or shortness of breath.   alendronate (FOSAMAX) 70 MG tablet Take 70 mg by mouth once a week.   azaTHIOprine  (IMURAN ) 50 MG tablet TAKE 1 TABLET BY MOUTH EVERY DAY   cefdinir (OMNICEF) 300 MG capsule Take 1 capsule po 2x a day x 5 days Orally as directed; Duration: 5 days   Colchicine  (MITIGARE ) 0.6 MG CAPS Take 1 capsule (0.6 mg total) by mouth daily.   levothyroxine  (SYNTHROID ) 88 MCG tablet Take 88 mcg by mouth daily before breakfast.   loratadine (CLARITIN) 10 MG tablet Take 10 mg by mouth daily.   No facility-administered encounter medications on file as of 01/17/2024.   Vitals:   01/17/24 1546  BP: 127/77  Pulse: 70  Temp: 98.1 F (36.7 C)  Height: 5' 6 (1.676 m)  Weight: 142 lb (64.4 kg)  SpO2: 98%  TempSrc: Oral  BMI (Calculated): 22.93     Physical Exam GEN: No acute distress. CV: Regular rate and rhythm, no murmurs. LUNGS: Clear to auscultation bilaterally, normal respiratory effort. SKIN JOINTS: Warm and dry, no rash.    Data Reviewed: Imaging: CTA 08/16/2014-mild right middle lobe bronchovascular opacities with tree-in-bud, scattered lung cysts. High-res CT 08/01/2019-cystic and nodular interstitial lung disease consistent with lymphocytic interstitial pneumonia.  Alternate diagnosis. CTA 03/26/2021-no pulmonary embolism, stable bullous changes, pulmonary nodules High resolution  CT 11/03/2021-scattered cysts and pulmonary nodules which are stable consistent with LIP.  No evidence of progression. High resolution CT 01/29/2023-stable scattered  interstitial changes consistent with LIP High resolution CT 11/30/2023-multiple thin-walled cysts stable compared to before.  Multiple pulmonary nodules are stable.  New lingular nodule with ground glass. I have reviewed the images personally.  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 CT scan shows persistent cystic changes in the lungs without progression. Lung function tests are stable with minor variations over the years. Current management with azathioprine  is effective. - Continue azathioprine  therapy. - Check check alpha-1 antitrypsin levels and phenotype for baseline assessment - Monitor for increased dyspnea and report if symptoms worsen.  Lung nodule New ground glass lung nodule noted at last CT scan.  Follow-up CT ordered for March 2026  Dry mouth (xerostomia) due to Sjogren's syndrome Dry mouth persists as a symptom of Sjogren's syndrome. She reports increased oral dryness despite increased fluid intake. - Encouraged continued hydration.  Mild Asthma Seasonal symptoms managed with albuterol  as needed. -Continue current management with albuterol  PRN.  Follow-up in 1 year or sooner if symptoms develop.   Plan/Recommendations: Follow-up CT for lung nodule A1aT  levels and phenotype  I personally spent a total of 30 minutes in the care of the patient today including preparing to see the patient, getting/reviewing separately obtained history, performing a medically appropriate exam/evaluation, counseling and educating, placing orders, independently interpreting results, and communicating results.   Lonna Coder MD [facility] 01/17/2024, 3:51 PM  CC: [name], MD

## 2024-01-18 ENCOUNTER — Ambulatory Visit
Admission: RE | Admit: 2024-01-18 | Discharge: 2024-01-18 | Disposition: A | Source: Ambulatory Visit | Attending: Internal Medicine | Admitting: Internal Medicine

## 2024-01-18 ENCOUNTER — Ambulatory Visit

## 2024-01-18 DIAGNOSIS — Z1231 Encounter for screening mammogram for malignant neoplasm of breast: Secondary | ICD-10-CM

## 2024-01-24 DIAGNOSIS — I340 Nonrheumatic mitral (valve) insufficiency: Secondary | ICD-10-CM | POA: Diagnosis not present

## 2024-01-24 DIAGNOSIS — M797 Fibromyalgia: Secondary | ICD-10-CM | POA: Diagnosis not present

## 2024-01-24 DIAGNOSIS — M3501 Sicca syndrome with keratoconjunctivitis: Secondary | ICD-10-CM | POA: Diagnosis not present

## 2024-01-24 DIAGNOSIS — J189 Pneumonia, unspecified organism: Secondary | ICD-10-CM | POA: Diagnosis not present

## 2024-01-24 DIAGNOSIS — I7300 Raynaud's syndrome without gangrene: Secondary | ICD-10-CM | POA: Diagnosis not present

## 2024-01-24 DIAGNOSIS — R509 Fever, unspecified: Secondary | ICD-10-CM | POA: Diagnosis not present

## 2024-01-24 DIAGNOSIS — Z1152 Encounter for screening for COVID-19: Secondary | ICD-10-CM | POA: Diagnosis not present

## 2024-01-24 DIAGNOSIS — R638 Other symptoms and signs concerning food and fluid intake: Secondary | ICD-10-CM | POA: Diagnosis not present

## 2024-01-24 DIAGNOSIS — D899 Disorder involving the immune mechanism, unspecified: Secondary | ICD-10-CM | POA: Diagnosis not present

## 2024-01-24 DIAGNOSIS — I341 Nonrheumatic mitral (valve) prolapse: Secondary | ICD-10-CM | POA: Diagnosis not present

## 2024-01-24 DIAGNOSIS — R051 Acute cough: Secondary | ICD-10-CM | POA: Diagnosis not present

## 2024-01-24 DIAGNOSIS — M3210 Systemic lupus erythematosus, organ or system involvement unspecified: Secondary | ICD-10-CM | POA: Diagnosis not present

## 2024-01-24 LAB — ALPHA-1 ANTITRYPSIN PHENOTYPE: A-1 Antitrypsin, Ser: 153 mg/dL (ref 83–199)

## 2024-01-25 ENCOUNTER — Other Ambulatory Visit: Payer: Self-pay | Admitting: Physician Assistant

## 2024-01-26 NOTE — Telephone Encounter (Signed)
 Last Fill: 10/25/2023  Labs: 11/29/2023 WBC 3.2, Lymphs Abs 0.5, 10/18/2023 CMP WNL  Next Visit: 03/29/2024  Last Visit: 10/26/2023  DX: Chondrocalcinosis   Current Dose per office note 10/26/2023: Mitigare  0.6 mg 1 capsule by mouth daily.   Okay to refill Colchicine ?

## 2024-02-01 ENCOUNTER — Ambulatory Visit: Payer: Self-pay | Admitting: Pulmonary Disease

## 2024-03-15 NOTE — Progress Notes (Unsigned)
 "  Office Visit Note  Patient: Stefanie [name]             Date of Birth: September 24, 1960           MRN: [id number]             PCP: [name], MD Referring: [name], MD Visit Date: 03/29/2024 Occupation: Data Unavailable  Subjective:  Left great toe pain   History of Present Illness: Stefanie [name] is a 64 y.o. female with history of sjogren's syndrome. Patient is taking imuran  50 mg 1 tablet by mouth daily.  She is tolerating Imuran  without any side effects and has not had any recent gaps in therapy.  Patient reports that 8 days ago she dropped a 13 pound ham on the left great toe.  Patient states that she has pain when trying to bear weight.  She has not yet followed up with orthopedics.  Patient states that otherwise her joint pain has been well-controlled on the current dose of Imuran .  She continues to have chronic sicca symptoms and was evaluated by ophthalmology yesterday.  She has been prescribed meibo which she plans on starting to use.  She denies any swollen lymph nodes or parotid swelling. Patient was evaluated by Dr. Theophilus on 01/17/2024.  She denies any new or worsening pulmonary symptoms.  Patient states that she recently had the flu and was treated with Tamiflu.  Patient states that she was off of any urine for about 3 days during that time.  Activities of Daily Living:  Patient reports morning stiffness for 15-20 minutes.   Patient Denies nocturnal pain.  Difficulty dressing/grooming: Reports Difficulty climbing stairs: Reports Difficulty getting out of chair: Reports Difficulty using hands for taps, buttons, cutlery, and/or writing: Reports  Review of Systems  Constitutional:  Positive for fatigue.  HENT:  Positive for mouth dryness. Negative for mouth sores.   Eyes:  Positive for dryness.  Respiratory:  Positive for shortness of breath.   Cardiovascular:  Negative for chest pain and palpitations.  Gastrointestinal:  Negative for blood in stool, constipation  and diarrhea.  Endocrine: Positive for increased urination.  Genitourinary:  Negative for involuntary urination.  Musculoskeletal:  Positive for joint pain, gait problem, joint pain, joint swelling, morning stiffness and muscle tenderness. Negative for myalgias, muscle weakness and myalgias.  Skin:  Positive for color change and hair loss. Negative for rash and sensitivity to sunlight.  Allergic/Immunologic: Negative for susceptible to infections.  Neurological:  Negative for dizziness and headaches.  Hematological:  Negative for swollen glands.  Psychiatric/Behavioral:  Positive for sleep disturbance. Negative for depressed mood. The patient is not nervous/anxious.     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   Prolonged QT interval 07/28/2016   Primary osteoarthritis of both hands 05/11/2016   Primary osteoarthritis of both knees 05/11/2016   History of hypothyroidism 05/11/2016   History of migraine 05/11/2016   Autoimmune disease (HCC) positive ANA, 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, 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, anticardiolipin antibody and positive rheumatoid factor 04/29/2016   ANA positive 04/29/2016  Rheumatoid factor positive 04/29/2016   Pseudogout 04/29/2016   Chondrocalcinosis 04/29/2016   High risk medication use 04/29/2016   Sjogren's syndrome with keratoconjunctivitis sicca 04/29/2016   History of neutropenia 04/29/2016   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   Hyponatremia 12/12/2013   Fever 12/13/2012   Pleuritic chest pain 12/06/2012   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   Anxiety 12/06/2012   ANEMIA-IRON DEFICIENCY 01/08/2010   GASTRIC DILATION, ACUTE 01/08/2010   NAUSEA ALONE 01/08/2010   ABDOMINAL PAIN, LEFT UPPER QUADRANT 01/08/2010    Past Medical History:  Diagnosis Date   Anemia    Arthritis    Asthma    DDD (degenerative disc disease), lumbar    Diabetes insipidus    Fibromyalgia    Headache    hx of migraines   Hypothyroidism    Lupus    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   Mitral valve prolapse    pt had echo on 09/04/21   PONV (postoperative nausea and vomiting)    PVC's (premature ventricular contractions)    SLE (systemic lupus erythematosus) (HCC) 12/06/2012   Thyroid  disease     Family History  Problem Relation Age of Onset   Atrial fibrillation Mother    Dementia Mother    Diabetes Father    Heart disease Father    Hyperlipidemia Father    Hypertension Father    Stroke Father    Diabetes Sister    Kidney disease Sister        transplant   Heart disease Sister        open heart surgery    Pancreatic disease Sister        transplant    Healthy Daughter    Cancer Maternal Grandmother    Diabetes Paternal Grandfather    Heart disease Paternal Grandfather    Hyperlipidemia Paternal Grandfather    Hypertension Paternal Grandfather    Healthy Son    Colon cancer Neg Hx    Esophageal cancer Neg Hx    Rectal cancer Neg Hx    Stomach cancer Neg Hx    Breast cancer Neg Hx    Past Surgical History:  Procedure Laterality Date   COLONOSCOPY     KNEE ARTHROSCOPY WITH MENISCAL REPAIR Left 12/12/2021   Procedure: LEFT KNEE ARTHROSCOPIC DEBRIDEMENT, LATERAL MENISCECTOMY;  Surgeon: Jerri Kay HERO, MD;  Location: [facility] OR;  Service: Orthopedics;  Laterality: Left;   PITUITARY EXCISION  1993   TONSILLECTOMY     Social History[1] Social History   Social History Narrative   Right handed     Immunization History  Administered Date(s) Administered    Influenza Inj Mdck Quad Pf 01/21/2020   Influenza-Unspecified 09/28/2018, 12/08/2022   PFIZER(Purple Top)SARS-COV-2 Vaccination 11/17/2019, 12/13/2019     Objective: Vital Signs: BP 106/68   Pulse 75   Temp (!) 97.5 F (36.4 C)   Resp 12   Ht 5' 6 (1.676 m)   Wt 137 lb (62.1 kg)   LMP 04/09/2014   BMI 22.11 kg/m    Physical Exam Vitals and nursing note reviewed.  Constitutional:      Appearance: She is well-developed.  HENT:     Head: Normocephalic and atraumatic.     Mouth/Throat:     Mouth: Mucous membranes are dry.     Comments: Dentures  Eyes:     Conjunctiva/sclera: Conjunctivae normal.  Cardiovascular:     Rate and Rhythm: Normal rate and regular rhythm.  Heart sounds: Murmur heard.  Pulmonary:     Effort: Pulmonary effort is normal.     Breath sounds: Normal breath sounds.  Abdominal:     General: Bowel sounds are normal.     Palpations: Abdomen is soft.  Musculoskeletal:     Cervical back: Normal range of motion.  Lymphadenopathy:     Cervical: No cervical adenopathy.  Skin:    General: Skin is warm and dry.     Capillary Refill: Capillary refill takes less than 2 seconds.  Neurological:     Mental Status: She is alert and oriented to person, place, and time.  Psychiatric:        Behavior: Behavior normal.      Musculoskeletal Exam: C-spine, thoracic spine, lumbar spine have good range of motion.  No midline spinal tenderness.  No SI joint tenderness.  Shoulder joints, elbow joints, wrist joints, MCPs, PIPs, DIPs have good range of motion with no synovitis.  CMC joint thickening and prominence bilaterally.  PIP and DIP thickening noted.  Complete fist formation bilaterally.  Hip joints have good range of motion with no groin pain. Synovial thickening of the left knee.  Ankle joints have good range of motion no tenderness or joint swelling.  No evidence of Achilles tendinitis or plantar fasciitis. Tenderness and swelling of the great great toe.   Erythema over left 1st PIP joint and along the distal phalanx.  Ecchymosis at base of left 2nd and 3rd toes.     CDAI Exam: CDAI Score: -- Patient Global: --; Provider Global: -- Swollen: --; Tender: -- Joint Exam 03/29/2024   No joint exam has been documented for this visit   There is currently no information documented on the homunculus. Go to the Rheumatology activity and complete the homunculus joint exam.  Investigation: No additional findings.  Imaging: XR Foot Complete Left Result Date: 03/29/2024 Acute commuted fracture of the digital tuft of the first toe was noted.  First MTP, PIP and DIP narrowing was noted.  No intertarsal, tibiotalar or subtalar joint space narrowing was noted.  Inferior calcaneal spur was noted.  Impression: These findings are suggestive of commuted fractures of the digital tuft of the first toe.  Osteoarthritic changes were noted.   Recent Labs: Lab Results  Component Value Date   WBC 3.2 (L) 11/29/2023   HGB 12.5 11/29/2023   PLT 217 11/29/2023   NA 139 10/18/2023   K 3.9 10/18/2023   CL 104 10/18/2023   CO2 29 10/18/2023   GLUCOSE 92 10/18/2023   BUN 15 10/18/2023   CREATININE 0.67 10/18/2023   BILITOT 0.5 10/18/2023   ALKPHOS 104 10/18/2023   AST 25 10/18/2023   ALT 17 10/18/2023   PROT 7.5 10/18/2023   ALBUMIN 4.3 10/18/2023   CALCIUM 9.5 10/18/2023   GFRAA 102 04/24/2020   QFTBGOLDPLUS NEGATIVE 10/16/2019    Speciality Comments: PLQ Eye Exam 02/26/2020 WNL @ [facility] follow up in 1 year  Procedures:  No procedures performed Allergies: Iron sucrose, Morphine , Venofer [ferric oxide], Azithromycin, and Other   Assessment / Plan:     Visit Diagnoses: Sjogren's syndrome with keratoconjunctivitis sicca - Positive ANA, positive Ro, positive La, positive RF sicca symptoms, inflammatory arthritis: She continues to have chronic sicca symptoms.  She was evaluated by ophthalmology yesterday.  She has been prescribed Meibo  eye drops which she plans on initiating.  She remains on Imuran  50 mg 1 tablet by mouth daily.  She is tolerating Imuran  without any side  effects and has not had any major gaps in treatment.  She has found Imuran  to be effective at managing her symptoms.  She has no synovitis on exam.  No new or worsening pulmonary symptoms.  Reviewed Dr. Jiles office visit note from 01/17/2024--no changes in treatment were recommended. Plan to obtain the following lab work today for further evaluation.  No medication changes will be made.  She was advised to notify us  if she develops any new or worsening symptoms. - Plan: CBC with Differential/Platelet, Comprehensive metabolic panel with GFR, Urinalysis, Routine w reflex microscopic, Anti-DNA antibody, double-stranded, ANA, C3 and C4, Sedimentation rate, Serum protein electrophoresis with reflex, Sjogrens syndrome-A extractable nuclear antibody, Sjogrens syndrome-B extractable nuclear antibody, Rheumatoid factor  High risk medication use - Imuran  50 mg 1 tablet by mouth daily-reduced due to neutropenia.  CBC updated on 11/29/23. CMP updated on 10/18/23. Orders for CBC and CMP released today.  Recently diagnosed with the flu.  Discussed the importance of holding imuran  if she develops signs or symptoms of an infection and to resume once the infection has completely cleared.  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 Plan to check CBC with diff.  - Plan: CBC with Differential/Platelet  Lymphoid interstitial pneumonia (HCC) - Followed by Dr. Theophilus, on 08/04/2022 PFTs had improved and diffusion capacity.  High-resolution chest CT 01/29/2023: Cystic lung disease. Chest CT scheduled for 05/11/2024.    Other fatigue: Chronic, stable. Plan to obtain the following lab work today.   Abnormal SPEP - Plan  to recheck SPEP.  Under care of hematology-Dr. Sherrod.  Plan: Serum protein electrophoresis with reflex  Muscle cramping: Not currently symptomatic.   Primary osteoarthritis of both hands: PIP and DIP thickening consistent with osteoarthritis of both hands. CMC joint thickening and subluxation bilaterally.    Chronic pain of both hips: Intermittent discomfort.   Primary osteoarthritis of both knees: Synovial thickening and valgus deformity of left knee.    Effusion, left knee - MRI of the left knee 09/27/2021:  She underwent left knee arthroscopic surgery on 12/12/2021 performed by Dr. Jerri.  Synovial thickening.   Chondrocalcinosis - She is taking Mitigare  0.6 mg 1 capsule by mouth daily.  Chondromalacia patellae, right knee: Crepitus--no effusion.   Age-related osteoporosis without current pathological fracture - DEXA 09/27/23:LFN BMD 0.551 with T-score -2.7.  RFN BMD 0.566 with T-score -2.6. She is taking Fosamax 70 mg 1 tablet by mouth once weekly--by Dr. Russo.    Pain in left foot -Patient presents today with increased pain affecting the left great toe and foot.  8 days ago the patient dropped a 13 pound hands on the left foot.  She experienced severe pain and had some difficulty bearing full weight.  She has not yet scheduled a follow-up visit with orthopedics.  On examination she has tenderness and swelling of the left great toe especially along the left first PIP joint and distal phalanx.  Some ecchymosis was noted at the base of the left 2nd and 3rd toes as well.  X-rays of the left foot were obtained today for further evaluation.  X-rays of the left foot updated today.  These findings are suggestive of commuted fractures of the digital tuft of the first toe. Osteoarthritic changes were noted.  Plan to call the patient to discuss results--recommend scheduling urgent evaluation with her orthopedist to discuss next steps.  Plan: XR Foot Complete Left  Cervical spondylosis without  myelopathy: C-spine has limited ROM with lateral rotation.    Fibromyalgia: She experiences intermittent myalgias and muscle tenderness due to fibromyalgia.  She continues to experience intermittent bouts of fatigue.  Discussed importance of regular exercise and good sleep hygiene.   Other medical conditions are listed as follows:   History of diabetes insipidus  History of migraine  History of hypothyroidism  History of anxiety   Orders: Orders Placed This Encounter  Procedures   XR Foot Complete Left   CBC with Differential/Platelet   Comprehensive metabolic panel with GFR   Urinalysis, Routine w reflex microscopic   Anti-DNA antibody, double-stranded   ANA   C3 and C4   Sedimentation rate   Serum protein electrophoresis with reflex   Sjogrens syndrome-A extractable nuclear antibody   Sjogrens syndrome-B extractable nuclear antibody   Rheumatoid factor   No orders of the defined types were placed in this encounter.    Follow-Up Instructions: Return in about 5 months (around 08/27/2024) for Sjogren's syndrome.   Waddell CHRISTELLA Craze, PA-C  Note - This record has been created using Dragon software.  Chart creation errors have been sought, but may not always  have been located. Such creation errors do not reflect on  the standard of medical care.     [1]  Social History Tobacco Use   Smoking status: Never    Passive exposure: Never   Smokeless tobacco: Never  Vaping Use   Vaping status: Never Used  Substance Use Topics   Alcohol use: No   Drug use: No   "

## 2024-03-29 ENCOUNTER — Ambulatory Visit: Attending: Physician Assistant | Admitting: Physician Assistant

## 2024-03-29 ENCOUNTER — Encounter: Payer: Self-pay | Admitting: Physician Assistant

## 2024-03-29 ENCOUNTER — Other Ambulatory Visit: Payer: Self-pay | Admitting: Physician Assistant

## 2024-03-29 ENCOUNTER — Ambulatory Visit

## 2024-03-29 VITALS — BP 106/68 | HR 75 | Temp 97.5°F | Resp 12 | Ht 66.0 in | Wt 137.0 lb

## 2024-03-29 DIAGNOSIS — M25551 Pain in right hip: Secondary | ICD-10-CM

## 2024-03-29 DIAGNOSIS — M19041 Primary osteoarthritis, right hand: Secondary | ICD-10-CM

## 2024-03-29 DIAGNOSIS — M3501 Sicca syndrome with keratoconjunctivitis: Secondary | ICD-10-CM

## 2024-03-29 DIAGNOSIS — R778 Other specified abnormalities of plasma proteins: Secondary | ICD-10-CM

## 2024-03-29 DIAGNOSIS — Z8669 Personal history of other diseases of the nervous system and sense organs: Secondary | ICD-10-CM

## 2024-03-29 DIAGNOSIS — M1120 Other chondrocalcinosis, unspecified site: Secondary | ICD-10-CM

## 2024-03-29 DIAGNOSIS — M170 Bilateral primary osteoarthritis of knee: Secondary | ICD-10-CM

## 2024-03-29 DIAGNOSIS — D702 Other drug-induced agranulocytosis: Secondary | ICD-10-CM

## 2024-03-29 DIAGNOSIS — R5383 Other fatigue: Secondary | ICD-10-CM

## 2024-03-29 DIAGNOSIS — M25462 Effusion, left knee: Secondary | ICD-10-CM

## 2024-03-29 DIAGNOSIS — G8929 Other chronic pain: Secondary | ICD-10-CM

## 2024-03-29 DIAGNOSIS — M79672 Pain in left foot: Secondary | ICD-10-CM

## 2024-03-29 DIAGNOSIS — J842 Lymphoid interstitial pneumonia: Secondary | ICD-10-CM | POA: Diagnosis not present

## 2024-03-29 DIAGNOSIS — M19042 Primary osteoarthritis, left hand: Secondary | ICD-10-CM

## 2024-03-29 DIAGNOSIS — M810 Age-related osteoporosis without current pathological fracture: Secondary | ICD-10-CM

## 2024-03-29 DIAGNOSIS — M25552 Pain in left hip: Secondary | ICD-10-CM

## 2024-03-29 DIAGNOSIS — R252 Cramp and spasm: Secondary | ICD-10-CM | POA: Diagnosis not present

## 2024-03-29 DIAGNOSIS — Z8659 Personal history of other mental and behavioral disorders: Secondary | ICD-10-CM

## 2024-03-29 DIAGNOSIS — Z79899 Other long term (current) drug therapy: Secondary | ICD-10-CM

## 2024-03-29 DIAGNOSIS — M797 Fibromyalgia: Secondary | ICD-10-CM

## 2024-03-29 DIAGNOSIS — Z8639 Personal history of other endocrine, nutritional and metabolic disease: Secondary | ICD-10-CM

## 2024-03-29 DIAGNOSIS — M2241 Chondromalacia patellae, right knee: Secondary | ICD-10-CM

## 2024-03-29 DIAGNOSIS — M47812 Spondylosis without myelopathy or radiculopathy, cervical region: Secondary | ICD-10-CM

## 2024-03-29 NOTE — Telephone Encounter (Signed)
 Last Fill: 01/04/2024  Labs: 03/28/2024 labs pending  Next Visit: 03/29/2024  Last Visit: 10/26/2023  DX: Sjogren's syndrome with keratoconjunctivitis sicca   Current Dose per office note 10/26/2023: Imuran  50 mg 1 tablet by mouth daily   Okay to refill Imuran ?

## 2024-03-30 ENCOUNTER — Ambulatory Visit: Payer: Self-pay | Admitting: Physician Assistant

## 2024-03-30 NOTE — Progress Notes (Signed)
Complements WNL

## 2024-03-30 NOTE — Progress Notes (Signed)
 WBC count has improved-3.8.  absolute lymphocytes remain low. We will continue to monitor. Rest of CBC WNL.   UA normal RF negative  Total protein is elevated, rest of CMP WNL.   ESR remains elevated but stable.

## 2024-03-31 LAB — COMPREHENSIVE METABOLIC PANEL WITH GFR
AG Ratio: 1.4 (calc) (ref 1.0–2.5)
ALT: 18 U/L (ref 6–29)
AST: 25 U/L (ref 10–35)
Albumin: 5 g/dL (ref 3.6–5.1)
Alkaline phosphatase (APISO): 89 U/L (ref 37–153)
BUN: 15 mg/dL (ref 7–25)
CO2: 31 mmol/L (ref 20–32)
Calcium: 10.1 mg/dL (ref 8.6–10.4)
Chloride: 100 mmol/L (ref 98–110)
Creat: 0.75 mg/dL (ref 0.50–1.05)
Globulin: 3.5 g/dL (ref 1.9–3.7)
Glucose, Bld: 85 mg/dL (ref 65–99)
Potassium: 4.3 mmol/L (ref 3.5–5.3)
Sodium: 138 mmol/L (ref 135–146)
Total Bilirubin: 0.8 mg/dL (ref 0.2–1.2)
Total Protein: 8.5 g/dL — ABNORMAL HIGH (ref 6.1–8.1)
eGFR: 89 mL/min/1.73m2

## 2024-03-31 LAB — ANTI-DNA ANTIBODY, DOUBLE-STRANDED: ds DNA Ab: 2 [IU]/mL

## 2024-03-31 LAB — C3 AND C4
C3 Complement: 155 mg/dL (ref 83–193)
C4 Complement: 31 mg/dL (ref 15–57)

## 2024-03-31 LAB — CBC WITH DIFFERENTIAL/PLATELET
Absolute Lymphocytes: 566 {cells}/uL — ABNORMAL LOW (ref 850–3900)
Absolute Monocytes: 418 {cells}/uL (ref 200–950)
Basophils Absolute: 30 {cells}/uL (ref 0–200)
Basophils Relative: 0.8 %
Eosinophils Absolute: 87 {cells}/uL (ref 15–500)
Eosinophils Relative: 2.3 %
HCT: 39.9 % (ref 35.9–46.0)
Hemoglobin: 13 g/dL (ref 11.7–15.5)
MCH: 29.7 pg (ref 27.0–33.0)
MCHC: 32.6 g/dL (ref 31.6–35.4)
MCV: 91.3 fL (ref 81.4–101.7)
MPV: 11.3 fL (ref 7.5–12.5)
Monocytes Relative: 11 %
Neutro Abs: 2698 {cells}/uL (ref 1500–7800)
Neutrophils Relative %: 71 %
Platelets: 251 Thousand/uL (ref 140–400)
RBC: 4.37 Million/uL (ref 3.80–5.10)
RDW: 13.7 % (ref 11.0–15.0)
Total Lymphocyte: 14.9 %
WBC: 3.8 Thousand/uL (ref 3.8–10.8)

## 2024-03-31 LAB — ANA: Anti Nuclear Antibody (ANA): POSITIVE — AB

## 2024-03-31 LAB — URINALYSIS, ROUTINE W REFLEX MICROSCOPIC
Bilirubin Urine: NEGATIVE
Glucose, UA: NEGATIVE
Hgb urine dipstick: NEGATIVE
Ketones, ur: NEGATIVE
Leukocytes,Ua: NEGATIVE
Nitrite: NEGATIVE
Protein, ur: NEGATIVE
Specific Gravity, Urine: 1.019 (ref 1.001–1.035)
pH: 6.5 (ref 5.0–8.0)

## 2024-03-31 LAB — PROTEIN ELECTROPHORESIS, SERUM, WITH REFLEX
Albumin ELP: 4.8 g/dL (ref 3.8–4.8)
Alpha 1: 0.3 g/dL (ref 0.2–0.3)
Alpha 2: 0.8 g/dL (ref 0.5–0.9)
Beta 2: 0.4 g/dL (ref 0.2–0.5)
Beta Globulin: 0.5 g/dL (ref 0.4–0.6)
Gamma Globulin: 1.5 g/dL (ref 0.8–1.7)
Total Protein: 8.4 g/dL — ABNORMAL HIGH (ref 6.1–8.1)

## 2024-03-31 LAB — SEDIMENTATION RATE: Sed Rate: 34 mm/h — ABNORMAL HIGH (ref 0–30)

## 2024-03-31 LAB — ANTI-NUCLEAR AB-TITER (ANA TITER): ANA Titer 1: 1:640 {titer} — ABNORMAL HIGH

## 2024-03-31 LAB — RHEUMATOID FACTOR: Rheumatoid fact SerPl-aCnc: 11 [IU]/mL

## 2024-03-31 LAB — SJOGRENS SYNDROME-A EXTRACTABLE NUCLEAR ANTIBODY: SSA (Ro) (ENA) Antibody, IgG: >8 AI — AB

## 2024-03-31 LAB — SJOGRENS SYNDROME-B EXTRACTABLE NUCLEAR ANTIBODY: SSB (La) (ENA) Antibody, IgG: <1 AI

## 2024-04-03 NOTE — Progress Notes (Signed)
 ANA remains positive  SPEP unremarkable  Ro antibody remains positive. La antibody negative

## 2024-05-11 ENCOUNTER — Other Ambulatory Visit

## 2024-08-29 ENCOUNTER — Ambulatory Visit: Admitting: Rheumatology
